# Patient Record
Sex: Male | Born: 1945 | Race: White | Hispanic: No | Smoking: Former smoker
Health system: Southern US, Community
[De-identification: ages and names within clinical notes are randomized; demographics above are authoritative.]

## PROBLEM LIST (undated history)

## (undated) DIAGNOSIS — E119 Type 2 diabetes mellitus without complications: Secondary | ICD-10-CM

## (undated) DIAGNOSIS — I10 Essential (primary) hypertension: Secondary | ICD-10-CM

## (undated) DIAGNOSIS — M109 Gout, unspecified: Secondary | ICD-10-CM

## (undated) DIAGNOSIS — K219 Gastro-esophageal reflux disease without esophagitis: Secondary | ICD-10-CM

## (undated) DIAGNOSIS — T7840XA Allergy, unspecified, initial encounter: Secondary | ICD-10-CM

## (undated) DIAGNOSIS — R350 Frequency of micturition: Secondary | ICD-10-CM

## (undated) DIAGNOSIS — K635 Polyp of colon: Secondary | ICD-10-CM

## (undated) DIAGNOSIS — G4733 Obstructive sleep apnea (adult) (pediatric): Secondary | ICD-10-CM

## (undated) DIAGNOSIS — I420 Dilated cardiomyopathy: Secondary | ICD-10-CM

## (undated) HISTORY — DX: Polyp of colon: K63.5

## (undated) HISTORY — DX: Type 2 diabetes mellitus without complications: E11.9

## (undated) HISTORY — DX: Allergy, unspecified, initial encounter: T78.40XA

## (undated) HISTORY — DX: Dilated cardiomyopathy: I42.0

## (undated) HISTORY — DX: Obstructive sleep apnea (adult) (pediatric): G47.33

## (undated) HISTORY — DX: Gout, unspecified: M10.9

## (undated) HISTORY — PX: EYE SURGERY: SHX253

## (undated) HISTORY — DX: Hemochromatosis, unspecified: E83.119

## (undated) HISTORY — DX: Gastro-esophageal reflux disease without esophagitis: K21.9

## (undated) HISTORY — DX: Essential (primary) hypertension: I10

## (undated) HISTORY — DX: Frequency of micturition: R35.0

---

## 2004-10-24 LAB — HM COLONOSCOPY

## 2006-08-17 ENCOUNTER — Ambulatory Visit: Payer: Self-pay | Admitting: Internal Medicine

## 2006-09-07 LAB — CBC WITH DIFFERENTIAL/PLATELET
Basophils Absolute: 0 10*3/uL (ref 0.0–0.1)
EOS%: 2.5 % (ref 0.0–7.0)
HCT: 41.7 % (ref 38.7–49.9)
HGB: 14.6 g/dL (ref 13.0–17.1)
LYMPH%: 27.2 % (ref 14.0–48.0)
MCH: 30.9 pg (ref 28.0–33.4)
MCV: 88.6 fL (ref 81.6–98.0)
NEUT%: 62.1 % (ref 40.0–75.0)
Platelets: 212 10*3/uL (ref 145–400)
lymph#: 1.5 10*3/uL (ref 0.9–3.3)

## 2006-09-07 LAB — FERRITIN: Ferritin: 20 ng/mL — ABNORMAL LOW (ref 22–322)

## 2006-11-28 ENCOUNTER — Ambulatory Visit: Payer: Self-pay | Admitting: Oncology

## 2006-11-30 LAB — CBC WITH DIFFERENTIAL/PLATELET
Basophils Absolute: 0 10*3/uL (ref 0.0–0.1)
Eosinophils Absolute: 0.1 10*3/uL (ref 0.0–0.5)
LYMPH%: 24.2 % (ref 14.0–48.0)
MCH: 33 pg (ref 28.0–33.4)
MCV: 89.4 fL (ref 81.6–98.0)
MONO%: 7.9 % (ref 0.0–13.0)
NEUT#: 4.7 10*3/uL (ref 1.5–6.5)
Platelets: 195 10*3/uL (ref 145–400)
RBC: 4.23 10*6/uL (ref 4.20–5.71)

## 2007-02-27 ENCOUNTER — Ambulatory Visit: Payer: Self-pay | Admitting: Oncology

## 2007-03-01 LAB — CBC WITH DIFFERENTIAL/PLATELET
BASO%: 0.6 % (ref 0.0–2.0)
EOS%: 2.3 % (ref 0.0–7.0)
HCT: 40 % (ref 38.7–49.9)
LYMPH%: 33.8 % (ref 14.0–48.0)
MCH: 33 pg (ref 28.0–33.4)
MCHC: 36.7 g/dL — ABNORMAL HIGH (ref 32.0–35.9)
MONO%: 8.9 % (ref 0.0–13.0)
NEUT%: 54.4 % (ref 40.0–75.0)
Platelets: 218 10*3/uL (ref 145–400)

## 2007-03-01 LAB — TECHNOLOGIST REVIEW

## 2007-03-01 LAB — FERRITIN: Ferritin: 105 ng/mL (ref 22–322)

## 2007-03-27 LAB — CBC WITH DIFFERENTIAL/PLATELET
Basophils Absolute: 0 10*3/uL (ref 0.0–0.1)
Eosinophils Absolute: 0.1 10*3/uL (ref 0.0–0.5)
HCT: 40.9 % (ref 38.7–49.9)
HGB: 14.9 g/dL (ref 13.0–17.1)
MCV: 91.2 fL (ref 81.6–98.0)
NEUT#: 3.8 10*3/uL (ref 1.5–6.5)
NEUT%: 63.4 % (ref 40.0–75.0)
RDW: 12.6 % (ref 11.2–14.6)
lymph#: 1.6 10*3/uL (ref 0.9–3.3)

## 2007-04-20 ENCOUNTER — Ambulatory Visit: Payer: Self-pay | Admitting: Oncology

## 2007-05-08 LAB — CBC WITH DIFFERENTIAL/PLATELET
Basophils Absolute: 0.1 10*3/uL (ref 0.0–0.1)
Eosinophils Absolute: 0.1 10*3/uL (ref 0.0–0.5)
HGB: 15.8 g/dL (ref 13.0–17.1)
LYMPH%: 24.3 % (ref 14.0–48.0)
MCH: UNDETERMINED pg (ref 28.0–33.4)
MCV: UNDETERMINED fL (ref 81.6–98.0)
MONO%: 7 % (ref 0.0–13.0)
NEUT#: 3.9 10*3/uL (ref 1.5–6.5)
NEUT%: 65.3 % (ref 40.0–75.0)
Platelets: 208 10*3/uL (ref 145–400)

## 2007-05-22 LAB — CBC WITH DIFFERENTIAL/PLATELET
BASO%: 0.5 % (ref 0.0–2.0)
EOS%: 2.5 % (ref 0.0–7.0)
Eosinophils Absolute: 0.2 10*3/uL (ref 0.0–0.5)
LYMPH%: 28.9 % (ref 14.0–48.0)
MCH: 33.2 pg (ref 28.0–33.4)
MCHC: 36.8 g/dL — ABNORMAL HIGH (ref 32.0–35.9)
MCV: 90.4 fL (ref 81.6–98.0)
MONO%: 8.3 % (ref 0.0–13.0)
Platelets: 188 10*3/uL (ref 145–400)
RBC: 4.36 10*6/uL (ref 4.20–5.71)

## 2007-05-31 LAB — CBC WITH DIFFERENTIAL/PLATELET
Eosinophils Absolute: 0.2 10*3/uL (ref 0.0–0.5)
HGB: 14.2 g/dL (ref 13.0–17.1)
MONO#: 0.6 10*3/uL (ref 0.1–0.9)
NEUT#: 4.3 10*3/uL (ref 1.5–6.5)
RBC: UNDETERMINED 10*6/uL (ref 4.20–5.71)
RDW: 10.9 % — ABNORMAL LOW (ref 11.2–14.6)
WBC: 6.9 10*3/uL (ref 4.0–10.0)

## 2007-05-31 LAB — FERRITIN: Ferritin: 29 ng/mL (ref 22–322)

## 2007-06-01 ENCOUNTER — Ambulatory Visit: Payer: Self-pay | Admitting: Oncology

## 2007-06-05 LAB — CBC WITH DIFFERENTIAL/PLATELET
BASO%: 0.4 % (ref 0.0–2.0)
Basophils Absolute: 0 10*3/uL (ref 0.0–0.1)
Eosinophils Absolute: 0.1 10*3/uL (ref 0.0–0.5)
HCT: UNDETERMINED % (ref 38.7–49.9)
HGB: 14.6 g/dL (ref 13.0–17.1)
MONO#: 0.5 10*3/uL (ref 0.1–0.9)
NEUT#: 3.2 10*3/uL (ref 1.5–6.5)
NEUT%: 59 % (ref 40.0–75.0)
WBC: 5.4 10*3/uL (ref 4.0–10.0)
lymph#: 1.5 10*3/uL (ref 0.9–3.3)

## 2007-09-03 ENCOUNTER — Ambulatory Visit: Payer: Self-pay | Admitting: Oncology

## 2007-12-03 ENCOUNTER — Ambulatory Visit: Payer: Self-pay | Admitting: Oncology

## 2007-12-06 LAB — FERRITIN: Ferritin: 62 ng/mL (ref 22–322)

## 2007-12-06 LAB — CBC WITH DIFFERENTIAL/PLATELET
BASO%: 0.2 % (ref 0.0–2.0)
Eosinophils Absolute: 0.2 10*3/uL (ref 0.0–0.5)
HCT: 42.2 % (ref 38.7–49.9)
MCHC: 34.7 g/dL (ref 32.0–35.9)
MONO#: 0.4 10*3/uL (ref 0.1–0.9)
NEUT#: 3.3 10*3/uL (ref 1.5–6.5)
NEUT%: 60 % (ref 40.0–75.0)
WBC: 5.5 10*3/uL (ref 4.0–10.0)
lymph#: 1.6 10*3/uL (ref 0.9–3.3)

## 2007-12-06 LAB — COMPREHENSIVE METABOLIC PANEL
ALT: 27 U/L (ref 0–53)
CO2: 23 mEq/L (ref 19–32)
Calcium: 9.3 mg/dL (ref 8.4–10.5)
Chloride: 107 mEq/L (ref 96–112)
Creatinine, Ser: 0.93 mg/dL (ref 0.40–1.50)
Glucose, Bld: 206 mg/dL — ABNORMAL HIGH (ref 70–99)
Sodium: 140 mEq/L (ref 135–145)
Total Protein: 6.5 g/dL (ref 6.0–8.3)

## 2008-01-30 ENCOUNTER — Ambulatory Visit: Payer: Self-pay | Admitting: Oncology

## 2008-02-04 LAB — FERRITIN: Ferritin: 78 ng/mL (ref 22–322)

## 2008-04-01 ENCOUNTER — Ambulatory Visit: Payer: Self-pay | Admitting: Oncology

## 2008-04-03 LAB — COMPREHENSIVE METABOLIC PANEL
Alkaline Phosphatase: 38 U/L — ABNORMAL LOW (ref 39–117)
BUN: 28 mg/dL — ABNORMAL HIGH (ref 6–23)
CO2: 25 mEq/L (ref 19–32)
Creatinine, Ser: 0.84 mg/dL (ref 0.40–1.50)
Glucose, Bld: 352 mg/dL — ABNORMAL HIGH (ref 70–99)
Sodium: 138 mEq/L (ref 135–145)
Total Bilirubin: 0.7 mg/dL (ref 0.3–1.2)
Total Protein: 6.3 g/dL (ref 6.0–8.3)

## 2008-07-31 ENCOUNTER — Ambulatory Visit: Payer: Self-pay | Admitting: Oncology

## 2008-08-04 LAB — CBC WITH DIFFERENTIAL/PLATELET
Basophils Absolute: 0 10*3/uL (ref 0.0–0.1)
Eosinophils Absolute: 0.1 10*3/uL (ref 0.0–0.5)
HCT: 41.7 % (ref 38.7–49.9)
HGB: 14.8 g/dL (ref 13.0–17.1)
MCH: 32.8 pg (ref 28.0–33.4)
MONO#: 0.5 10*3/uL (ref 0.1–0.9)
NEUT#: 3.5 10*3/uL (ref 1.5–6.5)
NEUT%: 62 % (ref 40.0–75.0)
RDW: 12.9 % (ref 11.2–14.6)
WBC: 5.6 10*3/uL (ref 4.0–10.0)
lymph#: 1.5 10*3/uL (ref 0.9–3.3)

## 2008-09-15 ENCOUNTER — Ambulatory Visit: Payer: Self-pay | Admitting: Oncology

## 2008-09-17 LAB — CBC WITH DIFFERENTIAL/PLATELET
BASO%: 0.2 % (ref 0.0–2.0)
HCT: 40.1 % (ref 38.7–49.9)
MCHC: 35.6 g/dL (ref 32.0–35.9)
MONO#: 0.5 10*3/uL (ref 0.1–0.9)
NEUT%: 62.3 % (ref 40.0–75.0)
RDW: 13.2 % (ref 11.2–14.6)
WBC: 5.8 10*3/uL (ref 4.0–10.0)
lymph#: 1.5 10*3/uL (ref 0.9–3.3)

## 2008-10-10 LAB — FERRITIN: Ferritin: 24 ng/mL (ref 22–322)

## 2009-01-29 ENCOUNTER — Ambulatory Visit: Payer: Self-pay | Admitting: Oncology

## 2009-02-02 LAB — FERRITIN: Ferritin: 88 ng/mL (ref 22–322)

## 2009-04-30 ENCOUNTER — Ambulatory Visit: Payer: Self-pay | Admitting: Oncology

## 2009-05-04 LAB — CBC WITH DIFFERENTIAL/PLATELET
Basophils Absolute: 0 10*3/uL (ref 0.0–0.1)
Eosinophils Absolute: 0.2 10*3/uL (ref 0.0–0.5)
HCT: 39.1 % (ref 38.4–49.9)
LYMPH%: 37.1 % (ref 14.0–49.0)
MCV: 91.3 fL (ref 79.3–98.0)
MONO%: 8.5 % (ref 0.0–14.0)
NEUT#: 2.9 10*3/uL (ref 1.5–6.5)
NEUT%: 51 % (ref 39.0–75.0)
Platelets: 164 10*3/uL (ref 140–400)
RBC: 4.28 10*6/uL (ref 4.20–5.82)

## 2009-05-26 ENCOUNTER — Ambulatory Visit: Payer: Self-pay | Admitting: Oncology

## 2009-06-11 LAB — CBC WITH DIFFERENTIAL/PLATELET
Basophils Absolute: 0 10*3/uL (ref 0.0–0.1)
Eosinophils Absolute: 0.2 10*3/uL (ref 0.0–0.5)
HGB: 14.5 g/dL (ref 13.0–17.1)
MCV: 93.5 fL (ref 79.3–98.0)
MONO#: 0.5 10*3/uL (ref 0.1–0.9)
MONO%: 8.7 % (ref 0.0–14.0)
NEUT#: 3.5 10*3/uL (ref 1.5–6.5)
Platelets: 194 10*3/uL (ref 140–400)
RDW: 13.6 % (ref 11.0–14.6)

## 2009-07-31 ENCOUNTER — Ambulatory Visit: Payer: Self-pay | Admitting: Oncology

## 2009-08-03 LAB — CBC WITH DIFFERENTIAL/PLATELET
Eosinophils Absolute: 0.2 10*3/uL (ref 0.0–0.5)
LYMPH%: 30.5 % (ref 14.0–49.0)
MCH: 32.3 pg (ref 27.2–33.4)
MCHC: 35.6 g/dL (ref 32.0–36.0)
MCV: 90.9 fL (ref 79.3–98.0)
Platelets: 214 10*3/uL (ref 140–400)
RBC: 4.56 10*6/uL (ref 4.20–5.82)
RDW: 12.2 % (ref 11.0–14.6)

## 2010-01-08 ENCOUNTER — Encounter (INDEPENDENT_AMBULATORY_CARE_PROVIDER_SITE_OTHER): Payer: Self-pay | Admitting: Internal Medicine

## 2010-01-08 ENCOUNTER — Ambulatory Visit: Payer: Self-pay

## 2010-01-08 ENCOUNTER — Ambulatory Visit (HOSPITAL_COMMUNITY): Admission: RE | Admit: 2010-01-08 | Discharge: 2010-01-08 | Payer: Self-pay | Admitting: Internal Medicine

## 2010-01-08 ENCOUNTER — Ambulatory Visit: Payer: Self-pay | Admitting: Cardiology

## 2010-02-02 ENCOUNTER — Ambulatory Visit: Payer: Self-pay | Admitting: Oncology

## 2010-02-02 LAB — COMPREHENSIVE METABOLIC PANEL
ALT: 20 U/L (ref 0–53)
Albumin: 4.6 g/dL (ref 3.5–5.2)
Alkaline Phosphatase: 37 U/L — ABNORMAL LOW (ref 39–117)
CO2: 23 mEq/L (ref 19–32)
Glucose, Bld: 130 mg/dL — ABNORMAL HIGH (ref 70–99)
Potassium: 3.8 mEq/L (ref 3.5–5.3)
Sodium: 140 mEq/L (ref 135–145)
Total Protein: 6.3 g/dL (ref 6.0–8.3)

## 2010-02-02 LAB — FERRITIN: Ferritin: 65 ng/mL (ref 22–322)

## 2010-07-30 ENCOUNTER — Ambulatory Visit: Payer: Self-pay | Admitting: Oncology

## 2010-08-03 LAB — FERRITIN: Ferritin: 117 ng/mL (ref 22–322)

## 2010-08-24 LAB — CBC WITH DIFFERENTIAL/PLATELET
BASO%: 0.3 % (ref 0.0–2.0)
EOS%: 2.3 % (ref 0.0–7.0)
Eosinophils Absolute: 0.1 10*3/uL (ref 0.0–0.5)
LYMPH%: 28.7 % (ref 14.0–49.0)
MCHC: UNDETERMINED g/dL (ref 32.0–36.0)
MCV: 87.6 fL (ref 79.3–98.0)
MONO%: 9.2 % (ref 0.0–14.0)
NEUT#: 3.7 10*3/uL (ref 1.5–6.5)
RBC: 4.44 10*6/uL (ref 4.20–5.82)
RDW: 13.3 % (ref 11.0–14.6)
nRBC: 0 % (ref 0–0)

## 2010-09-03 ENCOUNTER — Ambulatory Visit: Payer: Self-pay | Admitting: Oncology

## 2010-09-21 LAB — CBC WITH DIFFERENTIAL/PLATELET
BASO%: 0.8 % (ref 0.0–2.0)
EOS%: 2.7 % (ref 0.0–7.0)
HCT: 40.6 % (ref 38.4–49.9)
MCH: 33.2 pg (ref 27.2–33.4)
MCHC: 35.7 g/dL (ref 32.0–36.0)
MONO#: 0.5 10*3/uL (ref 0.1–0.9)
RBC: 4.37 10*6/uL (ref 4.20–5.82)
RDW: 13.1 % (ref 11.0–14.6)
WBC: 6 10*3/uL (ref 4.0–10.3)
lymph#: 1.8 10*3/uL (ref 0.9–3.3)

## 2010-12-02 ENCOUNTER — Other Ambulatory Visit: Payer: Self-pay | Admitting: Oncology

## 2010-12-02 ENCOUNTER — Encounter: Payer: 59 | Admitting: Oncology

## 2011-03-30 ENCOUNTER — Ambulatory Visit (HOSPITAL_COMMUNITY)
Admission: RE | Admit: 2011-03-30 | Discharge: 2011-03-30 | Disposition: A | Discharge status: Home / Self Care | Payer: 59 | Source: Ambulatory Visit | Attending: Oncology | Admitting: Oncology

## 2011-03-30 ENCOUNTER — Other Ambulatory Visit: Payer: Self-pay | Admitting: Oncology

## 2011-03-30 ENCOUNTER — Encounter (HOSPITAL_BASED_OUTPATIENT_CLINIC_OR_DEPARTMENT_OTHER): Payer: 59 | Admitting: Oncology

## 2011-03-30 DIAGNOSIS — M79609 Pain in unspecified limb: Secondary | ICD-10-CM | POA: Insufficient documentation

## 2011-03-30 DIAGNOSIS — I10 Essential (primary) hypertension: Secondary | ICD-10-CM

## 2011-03-30 DIAGNOSIS — E119 Type 2 diabetes mellitus without complications: Secondary | ICD-10-CM

## 2011-03-30 DIAGNOSIS — Z87891 Personal history of nicotine dependence: Secondary | ICD-10-CM

## 2011-03-30 DIAGNOSIS — M7989 Other specified soft tissue disorders: Secondary | ICD-10-CM | POA: Insufficient documentation

## 2011-03-30 LAB — COMPREHENSIVE METABOLIC PANEL
ALT: 16 U/L (ref 0–53)
AST: 11 U/L (ref 0–37)
CO2: 28 mEq/L (ref 19–32)
Calcium: 9.2 mg/dL (ref 8.4–10.5)
Chloride: 101 mEq/L (ref 96–112)
Creatinine, Ser: 0.93 mg/dL (ref 0.50–1.35)
Potassium: 4.3 mEq/L (ref 3.5–5.3)
Sodium: 135 mEq/L (ref 135–145)
Total Protein: 6.4 g/dL (ref 6.0–8.3)

## 2011-06-30 ENCOUNTER — Encounter: Payer: Medicare Other | Admitting: Oncology

## 2011-06-30 ENCOUNTER — Other Ambulatory Visit: Payer: Self-pay | Admitting: Oncology

## 2011-06-30 LAB — FERRITIN: Ferritin: 111 ng/mL (ref 22–322)

## 2011-09-26 ENCOUNTER — Other Ambulatory Visit: Payer: Self-pay | Admitting: *Deleted

## 2011-09-27 ENCOUNTER — Ambulatory Visit (HOSPITAL_BASED_OUTPATIENT_CLINIC_OR_DEPARTMENT_OTHER): Payer: Medicare Other | Admitting: Oncology

## 2011-09-27 ENCOUNTER — Other Ambulatory Visit (HOSPITAL_BASED_OUTPATIENT_CLINIC_OR_DEPARTMENT_OTHER): Payer: Medicare Other | Admitting: Lab

## 2011-09-27 DIAGNOSIS — E119 Type 2 diabetes mellitus without complications: Secondary | ICD-10-CM

## 2011-09-27 DIAGNOSIS — E7800 Pure hypercholesterolemia, unspecified: Secondary | ICD-10-CM

## 2011-09-27 DIAGNOSIS — I10 Essential (primary) hypertension: Secondary | ICD-10-CM

## 2011-09-27 NOTE — Progress Notes (Signed)
OFFICE PROGRESS NOTE   INTERVAL HISTORY:   He returns as scheduled. He feels well. His only complaint is a "dry" cough during the afternoon and evening. There are no associated symptoms. He usually has a cough this time of the year. He last underwent phlebotomy treatment in November of 2011.  Objective:  Vital signs in last 24 hours:  Blood pressure 129/74, pulse 70, temperature 97.6 F (36.4 C), temperature source Oral, weight 195 lb 1.6 oz (88.497 kg).   Resp: Lungs clear bilaterally Cardio: Distant heart sounds. Regular rate and rhythm. GI: No hepatosplenomegaly. Vascular: No leg edema    Lab Results:  Ferritin-111 on 06/30/2011, 128 on 09/27/2011  Medications: I have reviewed the patient's current medications.  Assessment/Plan: 1. Hereditary hemochromatosis.  He was last treated with phlebotomy therapy in November 2011.  The ferritin is elevated above the goal range today. 2. History of tobacco use in the remote past. 3. Hypertension. 4. Hypercholesterolemia. 5. Diabetes. 6. Gout. 7. Sleep apnea. 8. Family history of hemochromatosis.   Disposition:  He appears well. The ferritin level is elevated above the goal range. We will schedule a series of phlebotomy treatments to begin within the next one to 2 weeks. He will return for a ferritin level in 4 months. He is scheduled for an eight month office visit.   Lucile Shutters, MD  09/27/2011  5:53 PM

## 2011-09-28 ENCOUNTER — Telehealth: Payer: Self-pay | Admitting: Oncology

## 2011-09-28 ENCOUNTER — Encounter: Payer: Self-pay | Admitting: *Deleted

## 2011-09-28 ENCOUNTER — Other Ambulatory Visit: Payer: Self-pay | Admitting: *Deleted

## 2011-09-28 ENCOUNTER — Telehealth: Payer: Self-pay | Admitting: *Deleted

## 2011-09-28 NOTE — Telephone Encounter (Signed)
Made patient aware that ferritin is higher at 128. Needs phlebotomy every 2 weeks X 4 per Dr. Truett Perna. Will check CBC with 1st and 3rd phlebotomy. Patient understands and agrees--he is requesting only morning appointments.

## 2011-09-28 NOTE — Telephone Encounter (Signed)
S/w the pt regarding his phlebotomy appt on 10/05/2011 and to pick up the rest of his appts at that time

## 2011-10-04 ENCOUNTER — Ambulatory Visit (INDEPENDENT_AMBULATORY_CARE_PROVIDER_SITE_OTHER): Payer: Medicare Other | Admitting: Ophthalmology

## 2011-10-04 DIAGNOSIS — E1139 Type 2 diabetes mellitus with other diabetic ophthalmic complication: Secondary | ICD-10-CM

## 2011-10-04 DIAGNOSIS — E11319 Type 2 diabetes mellitus with unspecified diabetic retinopathy without macular edema: Secondary | ICD-10-CM

## 2011-10-04 DIAGNOSIS — H35039 Hypertensive retinopathy, unspecified eye: Secondary | ICD-10-CM

## 2011-10-04 DIAGNOSIS — I10 Essential (primary) hypertension: Secondary | ICD-10-CM

## 2011-10-06 ENCOUNTER — Other Ambulatory Visit: Payer: Self-pay | Admitting: *Deleted

## 2011-10-06 ENCOUNTER — Ambulatory Visit (HOSPITAL_BASED_OUTPATIENT_CLINIC_OR_DEPARTMENT_OTHER): Payer: Medicare Other

## 2011-10-06 ENCOUNTER — Other Ambulatory Visit: Payer: Self-pay | Admitting: Oncology

## 2011-10-06 ENCOUNTER — Other Ambulatory Visit (HOSPITAL_BASED_OUTPATIENT_CLINIC_OR_DEPARTMENT_OTHER): Payer: Medicare Other | Admitting: Lab

## 2011-10-06 ENCOUNTER — Telehealth: Payer: Self-pay | Admitting: Oncology

## 2011-10-06 LAB — CBC WITH DIFFERENTIAL/PLATELET
BASO%: 0.5 % (ref 0.0–2.0)
Eosinophils Absolute: 0.1 10*3/uL (ref 0.0–0.5)
HCT: 40.7 % (ref 38.4–49.9)
LYMPH%: 29.5 % (ref 14.0–49.0)
MCHC: 35.5 g/dL (ref 32.0–36.0)
MCV: 92.9 fL (ref 79.3–98.0)
MONO#: 0.5 10*3/uL (ref 0.1–0.9)
MONO%: 8.9 % (ref 0.0–14.0)
NEUT%: 58.6 % (ref 39.0–75.0)
Platelets: 175 10*3/uL (ref 140–400)
WBC: 5.7 10*3/uL (ref 4.0–10.3)

## 2011-10-06 NOTE — Telephone Encounter (Signed)
gve the pt his dec,Timtohy,march,aug 2013 appt calendar

## 2011-10-06 NOTE — Patient Instructions (Signed)
1191 Pt verbalized knowledge of next appt and would call if any problems.

## 2011-10-06 NOTE — Progress Notes (Signed)
1610 Pt tolerated phlebotomy well with 550cc of blood removed.  Pt verbalized no lightheadness/dizziness noted and drank fluids provided while here.

## 2011-10-31 ENCOUNTER — Other Ambulatory Visit: Payer: Self-pay | Admitting: Oncology

## 2011-11-01 ENCOUNTER — Other Ambulatory Visit: Payer: Self-pay | Admitting: Oncology

## 2011-11-01 ENCOUNTER — Ambulatory Visit (HOSPITAL_BASED_OUTPATIENT_CLINIC_OR_DEPARTMENT_OTHER): Payer: Medicare Other

## 2011-11-01 ENCOUNTER — Other Ambulatory Visit: Payer: Medicare Other | Admitting: Lab

## 2011-11-01 ENCOUNTER — Other Ambulatory Visit: Payer: Medicare Other

## 2011-11-01 LAB — CBC WITH DIFFERENTIAL/PLATELET
Basophils Absolute: 0 10*3/uL (ref 0.0–0.1)
EOS%: 2.4 % (ref 0.0–7.0)
HCT: 43.1 % (ref 38.4–49.9)
HGB: 15.2 g/dL (ref 13.0–17.1)
LYMPH%: 30 % (ref 14.0–49.0)
MCH: 33.1 pg (ref 27.2–33.4)
MCV: 94 fL (ref 79.3–98.0)
MONO%: 9.2 % (ref 0.0–14.0)
NEUT%: 58.2 % (ref 39.0–75.0)

## 2011-11-01 NOTE — Progress Notes (Signed)
550 grams removed during phlebotomy treatment; tolerated well. Patient given soda to drink and encouraged to drink plenty of fluids today.

## 2011-11-04 DIAGNOSIS — E559 Vitamin D deficiency, unspecified: Secondary | ICD-10-CM | POA: Diagnosis not present

## 2011-11-04 DIAGNOSIS — E782 Mixed hyperlipidemia: Secondary | ICD-10-CM | POA: Diagnosis not present

## 2011-11-04 DIAGNOSIS — M109 Gout, unspecified: Secondary | ICD-10-CM | POA: Diagnosis not present

## 2011-11-04 DIAGNOSIS — E119 Type 2 diabetes mellitus without complications: Secondary | ICD-10-CM | POA: Diagnosis not present

## 2011-11-04 DIAGNOSIS — I10 Essential (primary) hypertension: Secondary | ICD-10-CM | POA: Diagnosis not present

## 2011-11-14 ENCOUNTER — Other Ambulatory Visit: Payer: Self-pay | Admitting: *Deleted

## 2011-11-14 ENCOUNTER — Other Ambulatory Visit: Payer: Self-pay | Admitting: Oncology

## 2011-11-15 ENCOUNTER — Ambulatory Visit: Payer: Medicare Other

## 2011-11-15 ENCOUNTER — Other Ambulatory Visit: Payer: Self-pay | Admitting: *Deleted

## 2011-11-15 ENCOUNTER — Other Ambulatory Visit: Payer: Medicare Other | Admitting: Lab

## 2011-11-15 LAB — CBC WITH DIFFERENTIAL/PLATELET
Basophils Absolute: 0 10*3/uL (ref 0.0–0.1)
EOS%: 2.2 % (ref 0.0–7.0)
MCH: 33.5 pg — ABNORMAL HIGH (ref 27.2–33.4)
MCHC: 35.6 g/dL (ref 32.0–36.0)
MCV: 94 fL (ref 79.3–98.0)
MONO%: 7.2 % (ref 0.0–14.0)
RBC: 4.49 10*6/uL (ref 4.20–5.82)
RDW: 13.4 % (ref 11.0–14.6)

## 2011-11-15 NOTE — Progress Notes (Signed)
Patient tolerated phlebotomy well. Refreshments given, after 30 minutes vss. D/C to home, instructed to increase hydration, rest, eat a good lunch, if dizzy sit down and put head on his knees, if persists, call md.

## 2011-11-15 NOTE — Progress Notes (Signed)
Patient needs one more phlebotomy to complete the #4 cycle ordered. Will not require lab with last phlebotomy per Dr. Truett Perna.

## 2011-11-16 ENCOUNTER — Telehealth: Payer: Self-pay | Admitting: Oncology

## 2011-11-16 NOTE — Telephone Encounter (Signed)
called pt and scheduled appt for 11/29/2011

## 2011-11-23 ENCOUNTER — Other Ambulatory Visit: Payer: Self-pay | Admitting: Certified Registered Nurse Anesthetist

## 2011-11-23 ENCOUNTER — Other Ambulatory Visit: Payer: Self-pay | Admitting: *Deleted

## 2011-11-29 ENCOUNTER — Other Ambulatory Visit: Payer: Self-pay | Admitting: Nurse Practitioner

## 2011-11-29 ENCOUNTER — Ambulatory Visit (HOSPITAL_BASED_OUTPATIENT_CLINIC_OR_DEPARTMENT_OTHER): Payer: Medicare Other

## 2011-11-29 NOTE — Progress Notes (Signed)
Removed 500cc blood from patient in 10 minutes; patient tolerated well; pre and post vs stable.

## 2011-11-29 NOTE — Patient Instructions (Signed)
Patient discharged home with no complaints; 30 minute post phlebotomy observation done; post vs stable; patient discharged home with no c/o pain or distress.

## 2012-01-26 ENCOUNTER — Other Ambulatory Visit (HOSPITAL_BASED_OUTPATIENT_CLINIC_OR_DEPARTMENT_OTHER): Payer: Medicare Other | Admitting: Lab

## 2012-01-26 LAB — FERRITIN: Ferritin: 32 ng/mL (ref 22–322)

## 2012-01-31 ENCOUNTER — Telehealth: Payer: Self-pay | Admitting: *Deleted

## 2012-01-31 DIAGNOSIS — E119 Type 2 diabetes mellitus without complications: Secondary | ICD-10-CM | POA: Diagnosis not present

## 2012-01-31 DIAGNOSIS — E559 Vitamin D deficiency, unspecified: Secondary | ICD-10-CM | POA: Diagnosis not present

## 2012-01-31 DIAGNOSIS — I10 Essential (primary) hypertension: Secondary | ICD-10-CM | POA: Diagnosis not present

## 2012-01-31 DIAGNOSIS — E782 Mixed hyperlipidemia: Secondary | ICD-10-CM | POA: Diagnosis not present

## 2012-01-31 DIAGNOSIS — Z79899 Other long term (current) drug therapy: Secondary | ICD-10-CM | POA: Diagnosis not present

## 2012-01-31 NOTE — Telephone Encounter (Signed)
Patient notified of ferritin in goal range. Reviewed appointment date/time.

## 2012-01-31 NOTE — Telephone Encounter (Signed)
Message copied by Wandalee Ferdinand on Tue Jan 31, 2012  6:09 PM ------      Message from: Ladene Artist      Created: Fri Jan 27, 2012  6:57 PM       Please call patient, ferritin in goal range, f/u as scheduled

## 2012-04-20 ENCOUNTER — Telehealth: Payer: Self-pay | Admitting: Oncology

## 2012-04-20 NOTE — Telephone Encounter (Signed)
called pt and r/s appt on 08/08 to 08/05 due to md on PAL

## 2012-05-01 DIAGNOSIS — M109 Gout, unspecified: Secondary | ICD-10-CM | POA: Diagnosis not present

## 2012-05-01 DIAGNOSIS — I10 Essential (primary) hypertension: Secondary | ICD-10-CM | POA: Diagnosis not present

## 2012-05-01 DIAGNOSIS — E782 Mixed hyperlipidemia: Secondary | ICD-10-CM | POA: Diagnosis not present

## 2012-05-01 DIAGNOSIS — E119 Type 2 diabetes mellitus without complications: Secondary | ICD-10-CM | POA: Diagnosis not present

## 2012-05-01 DIAGNOSIS — Z79899 Other long term (current) drug therapy: Secondary | ICD-10-CM | POA: Diagnosis not present

## 2012-05-28 ENCOUNTER — Ambulatory Visit (HOSPITAL_BASED_OUTPATIENT_CLINIC_OR_DEPARTMENT_OTHER): Payer: Medicare Other | Admitting: Oncology

## 2012-05-28 ENCOUNTER — Telehealth: Payer: Self-pay | Admitting: Oncology

## 2012-05-28 ENCOUNTER — Other Ambulatory Visit: Payer: Medicare Other | Admitting: Lab

## 2012-05-28 DIAGNOSIS — I10 Essential (primary) hypertension: Secondary | ICD-10-CM

## 2012-05-28 DIAGNOSIS — E119 Type 2 diabetes mellitus without complications: Secondary | ICD-10-CM

## 2012-05-28 LAB — FERRITIN: Ferritin: 64 ng/mL (ref 22–322)

## 2012-05-28 NOTE — Telephone Encounter (Signed)
appts made and printed for pt aom °

## 2012-05-28 NOTE — Progress Notes (Signed)
   [facility]    OFFICE PROGRESS NOTE   INTERVAL HISTORY:   He returns as scheduled. No complaint. He last underwent phlebotomy in February of this year.  Objective:  Vital signs in last 24 hours:  Blood pressure 162/85, pulse 76, temperature 98.2 F (36.8 C), temperature source Oral, resp. rate 20, height 5\' 5"  (1.651 m), weight 198 lb 6.4 oz (89.994 kg).   Resp: Lungs clear bilaterally Cardio: Regular rate and rhythm GI: No hepatosplenomegaly Vascular: No leg edema   Lab Results:  Lab Results  Component Value Date   WBC 6.1 11/15/2011   HGB 15.0 11/15/2011   HCT 42.2 11/15/2011   MCV 94.0 11/15/2011   PLT 178 11/15/2011   Ferritin-32 on 01/26/2012, 64 on 05/28/2012   Medications: I have reviewed the patient's current medications.  Assessment/Plan: 1. 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 He was last treated with phlebotomy therapy in February of 2013. The ferritin level is slightly elevated today  2. History of tobacco use in the remote past. 3. Hypertension. 4. Hypercholesterolemia. 5. Diabetes. 6. Gout. 7. Sleep apnea. 8. Family history of hemochromatosis.  Disposition:  He appears stable. He will return for a repeat ferritin in 3 months. The plan is to initiate phlebotomy therapy for a ferritin level of greater than 100. Mr. Nienhuis will return for an office visit in 6 months.   Thornton Papas, MD  05/28/2012  6:52 PM

## 2012-05-29 ENCOUNTER — Other Ambulatory Visit: Payer: Medicare Other | Admitting: Lab

## 2012-05-31 ENCOUNTER — Telehealth: Payer: Self-pay | Admitting: *Deleted

## 2012-05-31 ENCOUNTER — Ambulatory Visit: Payer: Medicare Other | Admitting: Oncology

## 2012-05-31 ENCOUNTER — Other Ambulatory Visit: Payer: Medicare Other | Admitting: Lab

## 2012-05-31 NOTE — Telephone Encounter (Signed)
Spoke with pt informing him Ferritin is OK and will repeat lab in 3 months.  Pt verbalized understanding and have Nov. appt already.

## 2012-05-31 NOTE — Telephone Encounter (Signed)
Message copied by Caleb Popp on Thu May 31, 2012 11:43 AM ------      Message from: Thornton Papas B      Created: Mon May 28, 2012  9:39 PM       Please call patient, ferritin is ok, repeat in 3 months

## 2012-08-21 DIAGNOSIS — Z0000 Encounter for general adult medical examination without abnormal findings: Secondary | ICD-10-CM | POA: Diagnosis not present

## 2012-08-21 DIAGNOSIS — Z1212 Encounter for screening for malignant neoplasm of rectum: Secondary | ICD-10-CM | POA: Diagnosis not present

## 2012-08-21 DIAGNOSIS — D649 Anemia, unspecified: Secondary | ICD-10-CM | POA: Diagnosis not present

## 2012-08-21 DIAGNOSIS — R5383 Other fatigue: Secondary | ICD-10-CM | POA: Diagnosis not present

## 2012-08-21 DIAGNOSIS — Z23 Encounter for immunization: Secondary | ICD-10-CM | POA: Diagnosis not present

## 2012-08-21 DIAGNOSIS — Z125 Encounter for screening for malignant neoplasm of prostate: Secondary | ICD-10-CM | POA: Diagnosis not present

## 2012-08-21 DIAGNOSIS — E559 Vitamin D deficiency, unspecified: Secondary | ICD-10-CM | POA: Diagnosis not present

## 2012-08-21 DIAGNOSIS — E782 Mixed hyperlipidemia: Secondary | ICD-10-CM | POA: Diagnosis not present

## 2012-08-21 DIAGNOSIS — M109 Gout, unspecified: Secondary | ICD-10-CM | POA: Diagnosis not present

## 2012-08-21 DIAGNOSIS — E119 Type 2 diabetes mellitus without complications: Secondary | ICD-10-CM | POA: Diagnosis not present

## 2012-08-21 DIAGNOSIS — I10 Essential (primary) hypertension: Secondary | ICD-10-CM | POA: Diagnosis not present

## 2012-08-24 ENCOUNTER — Other Ambulatory Visit (HOSPITAL_COMMUNITY): Payer: Self-pay | Admitting: Internal Medicine

## 2012-08-24 ENCOUNTER — Ambulatory Visit (HOSPITAL_COMMUNITY)
Admission: RE | Admit: 2012-08-24 | Discharge: 2012-08-24 | Disposition: A | Discharge status: Home / Self Care | Payer: Medicare Other | Source: Ambulatory Visit | Attending: Internal Medicine | Admitting: Internal Medicine

## 2012-08-24 DIAGNOSIS — R059 Cough, unspecified: Secondary | ICD-10-CM | POA: Insufficient documentation

## 2012-08-24 DIAGNOSIS — R05 Cough: Secondary | ICD-10-CM

## 2012-08-24 IMAGING — CR DG CHEST 2V
2 series · 2 of 2 positions shown · non-contrast
Comparison: None.

CLINICAL DATA: 66-year-old male with cough.

CHEST - 2 VIEW

[view not recorded (1 of 2)]
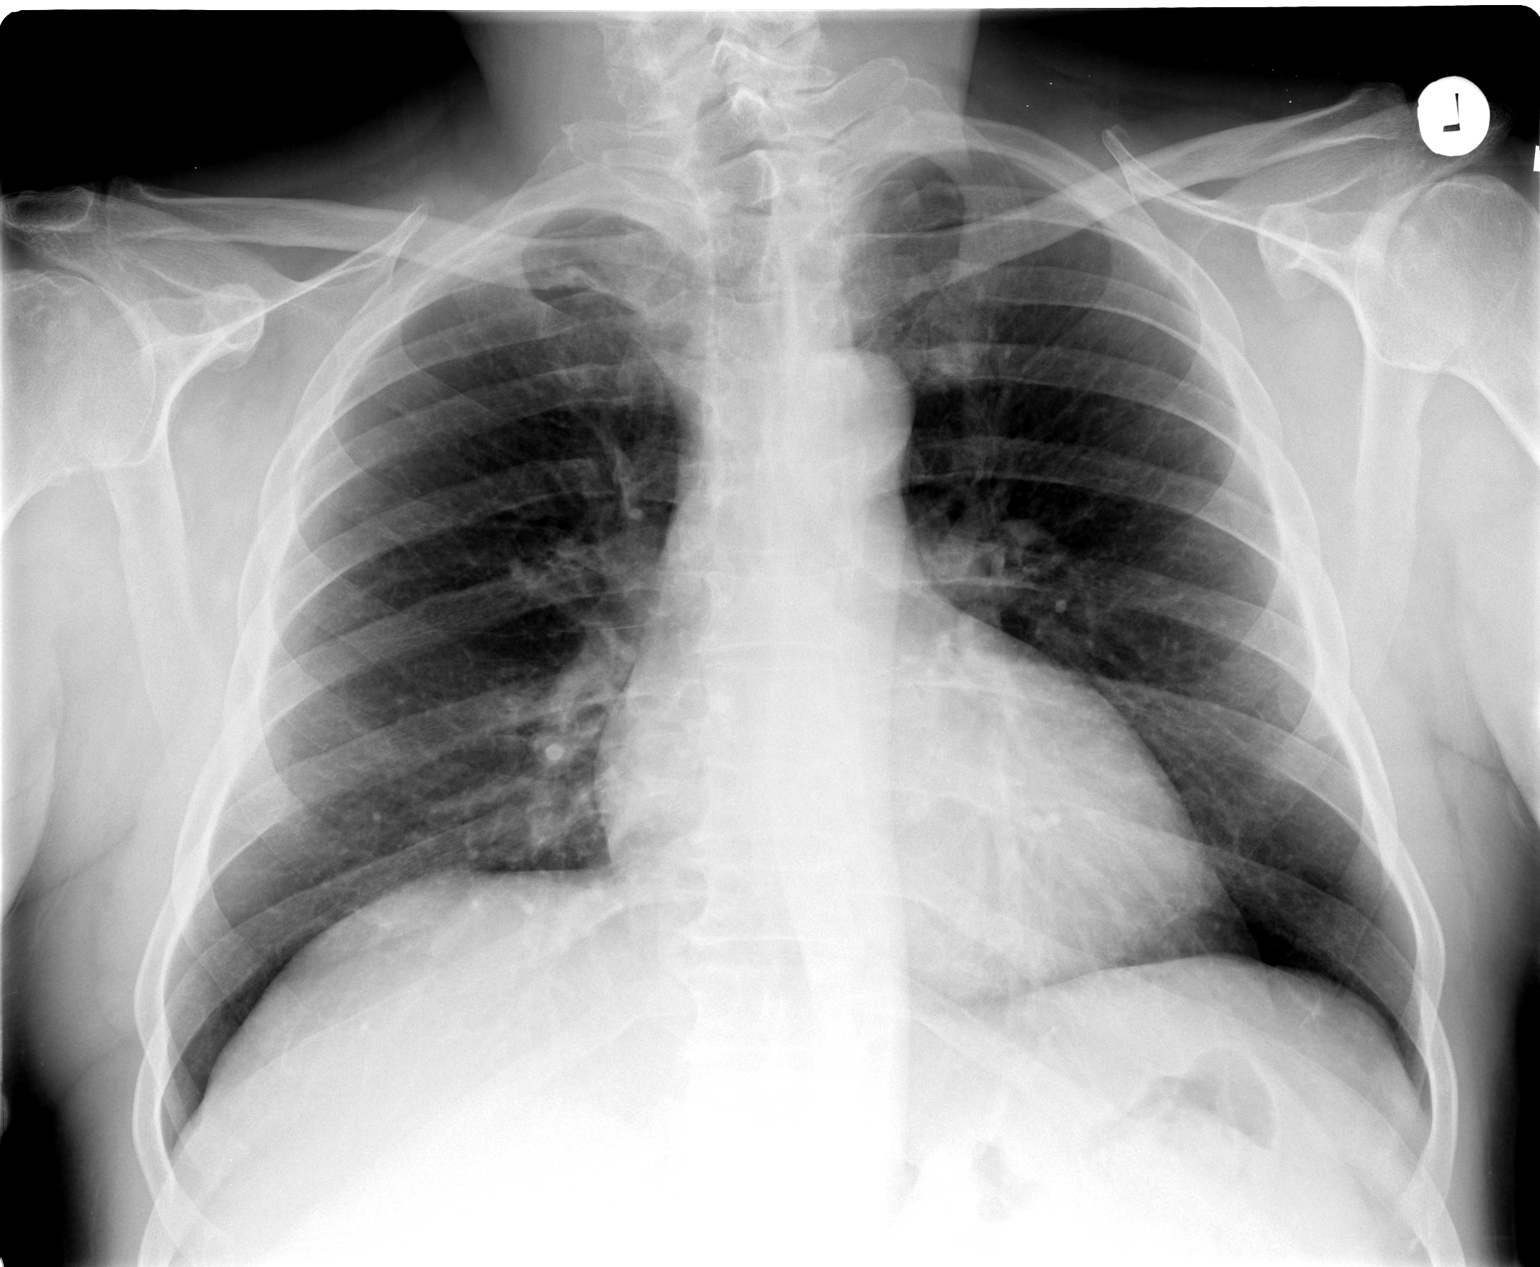

[view not recorded (2 of 2)]
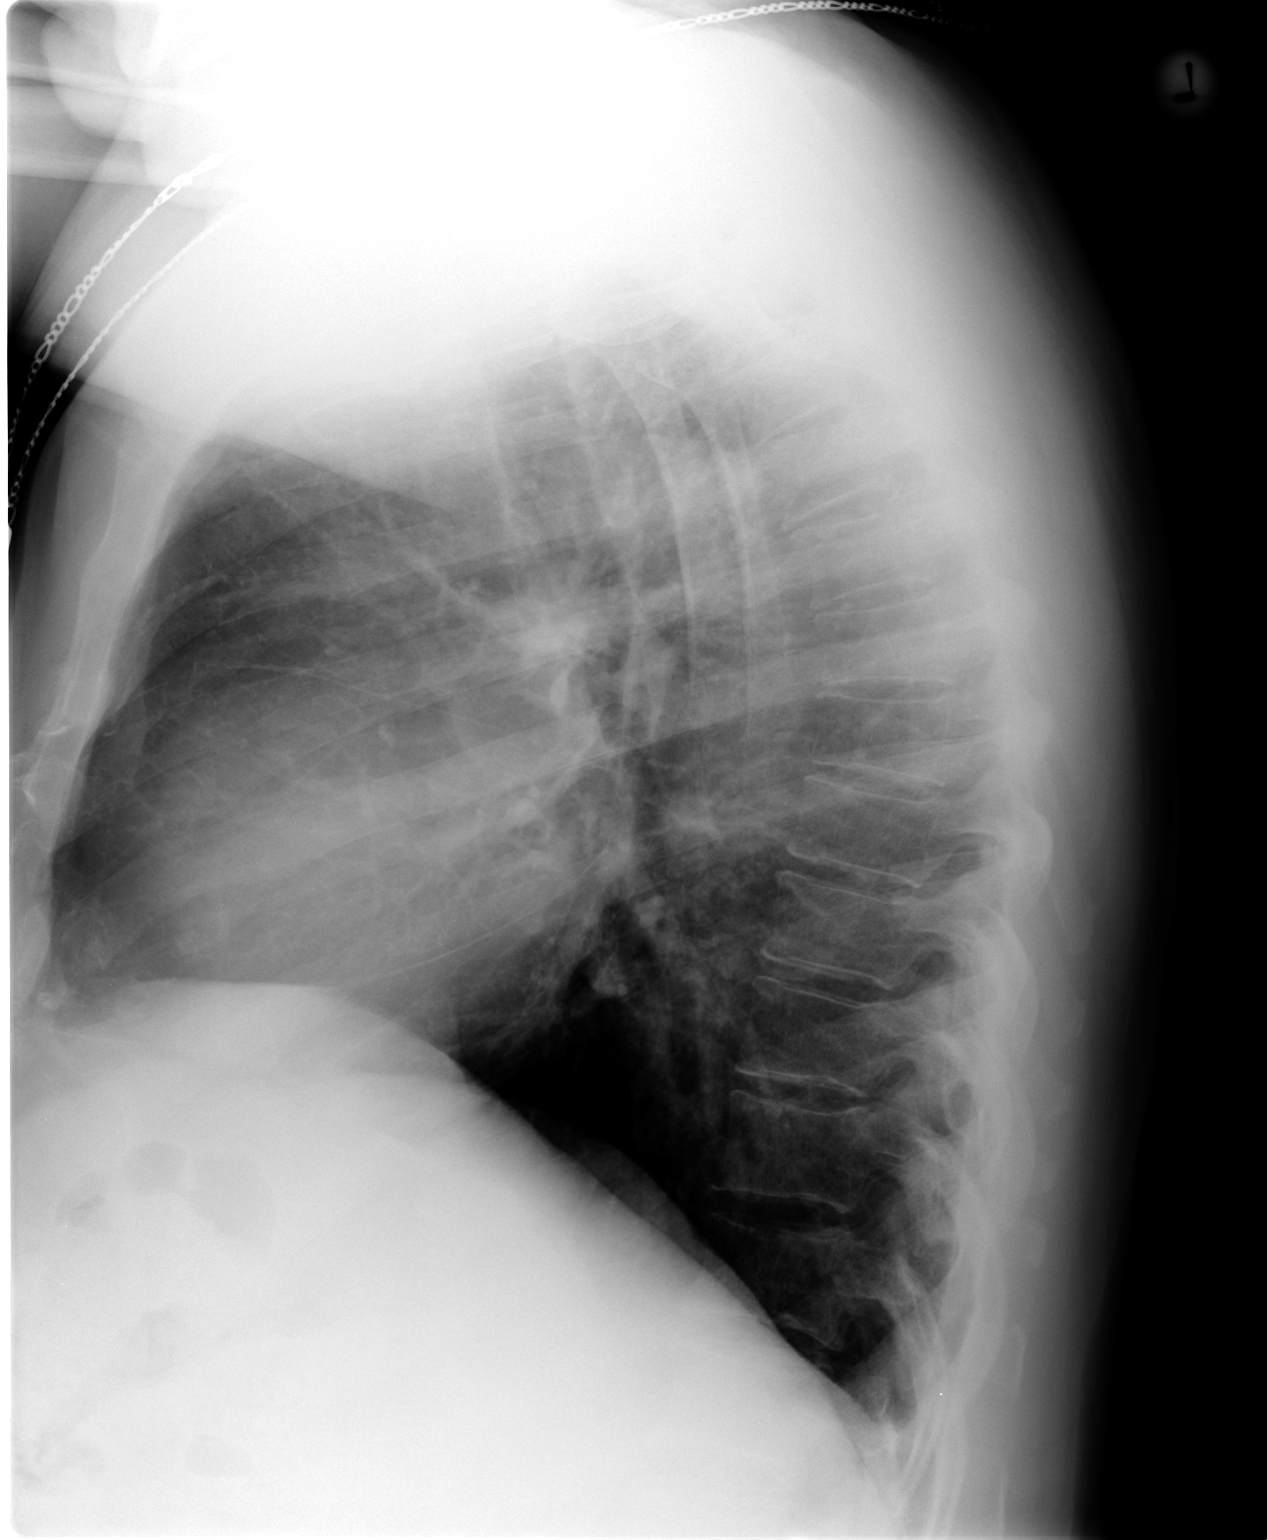

[2 of 2 positions shown; findings below may reference images not displayed]

FINDINGS: Normal lung volumes.  Cardiac size and mediastinal
contours are within normal limits.  Visualized tracheal air column
is within normal limits.  No pneumothorax, pulmonary edema, pleural
effusion or confluent pulmonary opacity. No acute osseous
abnormality identified.
IMPRESSION: No acute cardiopulmonary abnormality.

## 2012-08-27 ENCOUNTER — Other Ambulatory Visit (HOSPITAL_BASED_OUTPATIENT_CLINIC_OR_DEPARTMENT_OTHER): Payer: Medicare Other | Admitting: Lab

## 2012-08-27 LAB — FERRITIN: Ferritin: 98 ng/mL (ref 22–322)

## 2012-08-29 ENCOUNTER — Telehealth: Payer: Self-pay | Admitting: *Deleted

## 2012-08-29 NOTE — Telephone Encounter (Signed)
Notified patient of ferritin at 98, but still in goal. Will recheck in 3 months. He understands and agrees.

## 2012-08-29 NOTE — Telephone Encounter (Signed)
Message copied by Wandalee Ferdinand on Wed Aug 29, 2012  3:44 PM ------      Message from: Thornton Papas B      Created: Mon Aug 27, 2012  8:55 PM       Please call patient, Brett [name] higher, but still within goal range-<100, check Brett [name] in 3 mo.

## 2012-08-29 NOTE — Telephone Encounter (Signed)
Message copied by Gala Romney on Wed Aug 29, 2012  3:46 PM ------      Message from: Thornton Papas B      Created: Mon Aug 27, 2012  8:55 PM       Please call patient, ferritin higher, but still within goal range-<100, check ferritin in 3 mo.

## 2012-10-03 ENCOUNTER — Ambulatory Visit (INDEPENDENT_AMBULATORY_CARE_PROVIDER_SITE_OTHER): Payer: Medicare Other | Admitting: Ophthalmology

## 2012-10-03 DIAGNOSIS — I10 Essential (primary) hypertension: Secondary | ICD-10-CM

## 2012-10-03 DIAGNOSIS — E1139 Type 2 diabetes mellitus with other diabetic ophthalmic complication: Secondary | ICD-10-CM

## 2012-10-03 DIAGNOSIS — H35379 Puckering of macula, unspecified eye: Secondary | ICD-10-CM

## 2012-10-03 DIAGNOSIS — H2510 Age-related nuclear cataract, unspecified eye: Secondary | ICD-10-CM

## 2012-10-03 DIAGNOSIS — E11319 Type 2 diabetes mellitus with unspecified diabetic retinopathy without macular edema: Secondary | ICD-10-CM

## 2012-10-03 DIAGNOSIS — H43819 Vitreous degeneration, unspecified eye: Secondary | ICD-10-CM

## 2012-10-03 DIAGNOSIS — H35039 Hypertensive retinopathy, unspecified eye: Secondary | ICD-10-CM

## 2012-11-26 ENCOUNTER — Telehealth: Payer: Self-pay | Admitting: *Deleted

## 2012-11-26 ENCOUNTER — Ambulatory Visit (HOSPITAL_BASED_OUTPATIENT_CLINIC_OR_DEPARTMENT_OTHER): Payer: Medicare Other | Admitting: Oncology

## 2012-11-26 ENCOUNTER — Other Ambulatory Visit (HOSPITAL_BASED_OUTPATIENT_CLINIC_OR_DEPARTMENT_OTHER): Payer: Medicare Other | Admitting: Lab

## 2012-11-26 ENCOUNTER — Telehealth: Payer: Self-pay | Admitting: Oncology

## 2012-11-26 DIAGNOSIS — Z87891 Personal history of nicotine dependence: Secondary | ICD-10-CM | POA: Diagnosis not present

## 2012-11-26 LAB — FERRITIN: Ferritin: 83 ng/mL (ref 22–322)

## 2012-11-26 NOTE — Progress Notes (Signed)
   [facility]    OFFICE PROGRESS NOTE   INTERVAL HISTORY:   He returns as scheduled. No complaint. He last underwent phlebotomy therapy in February of 2013. He reports no problem with the phlebotomy treatments.  Objective:  Vital signs in last 24 hours:  Blood pressure 151/83, pulse 77, temperature 97.6 F (36.4 C), temperature source Oral, resp. rate 18, height 5\' 5"  (1.651 m), weight 197 lb 3.2 oz (89.449 kg). Resp: Lungs clear bilaterally Cardio: Regular rate and rhythm GI: No hepatosplenomegaly Vascular: No leg edema   Lab Results:  Lab Results  Component Value Date   WBC 6.1 11/15/2011   HGB 15.0 11/15/2011   HCT 42.2 11/15/2011   MCV 94.0 11/15/2011   PLT 178 11/15/2011   ferritin on 08/27/2012-98    Medications: I have reviewed the patient's current medications.  Assessment/Plan: 1. 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 He was last treated with phlebotomy therapy in February of 2013.  2. History of tobacco use in the remote past. 3. Hypertension. 4. Hypercholesterolemia. 5. Diabetes. 6. Gout. 7. Sleep apnea. 8. Family history of hemochromatosis.  Disposition:  He appears stable. The plan is to proceed with phlebotomy therapy if the ferritin returns at greater than 100 today. He will be scheduled for 6 phlebotomy treatments at a 2 week interval. The ferritin will be checked in 4 months and 8 months. Brett [name] will return for an office visit in one year.   Thornton Papas, MD  11/26/2012  9:39 AM

## 2012-11-26 NOTE — Telephone Encounter (Signed)
Per staff message and POF I have scheduled appts.  JMW  

## 2012-11-26 NOTE — Telephone Encounter (Signed)
Gave pt tenetative appt calendar for lab and MD, emailed Marcelino Duster regarding chemo

## 2012-11-27 ENCOUNTER — Telehealth: Payer: Self-pay | Admitting: *Deleted

## 2012-11-27 ENCOUNTER — Telehealth: Payer: Self-pay | Admitting: Oncology

## 2012-11-27 ENCOUNTER — Other Ambulatory Visit: Payer: Self-pay | Admitting: *Deleted

## 2012-11-27 NOTE — Telephone Encounter (Signed)
Message copied by Caleb Popp on Tue Nov 27, 2012  4:16 PM ------      Message from: Thornton Papas B      Created: Mon Nov 26, 2012  6:30 PM       Please call patient, ferritin is lower, cancel all scheduled phlebotomy treatments, check ferritin and cbc in 2 months

## 2012-11-27 NOTE — Telephone Encounter (Signed)
Called pt, ferritin results given. He understands phlebotomies will be canceled. Lab only in two months.

## 2012-11-27 NOTE — Telephone Encounter (Signed)
Talked to pt and he is aware of lab and MD for the whole year 2014

## 2012-12-03 DIAGNOSIS — Z79899 Other long term (current) drug therapy: Secondary | ICD-10-CM | POA: Diagnosis not present

## 2012-12-03 DIAGNOSIS — N529 Male erectile dysfunction, unspecified: Secondary | ICD-10-CM | POA: Diagnosis not present

## 2012-12-03 DIAGNOSIS — E119 Type 2 diabetes mellitus without complications: Secondary | ICD-10-CM | POA: Diagnosis not present

## 2012-12-03 DIAGNOSIS — I10 Essential (primary) hypertension: Secondary | ICD-10-CM | POA: Diagnosis not present

## 2012-12-03 DIAGNOSIS — E559 Vitamin D deficiency, unspecified: Secondary | ICD-10-CM | POA: Diagnosis not present

## 2012-12-03 DIAGNOSIS — E782 Mixed hyperlipidemia: Secondary | ICD-10-CM | POA: Diagnosis not present

## 2012-12-03 DIAGNOSIS — M109 Gout, unspecified: Secondary | ICD-10-CM | POA: Diagnosis not present

## 2012-12-12 ENCOUNTER — Other Ambulatory Visit: Payer: Medicare Other | Admitting: Lab

## 2012-12-26 ENCOUNTER — Other Ambulatory Visit: Payer: Medicare Other | Admitting: Lab

## 2013-01-22 ENCOUNTER — Other Ambulatory Visit (HOSPITAL_BASED_OUTPATIENT_CLINIC_OR_DEPARTMENT_OTHER): Payer: Medicare Other | Admitting: Lab

## 2013-01-22 LAB — CBC WITH DIFFERENTIAL/PLATELET
Basophils Absolute: 0 10*3/uL (ref 0.0–0.1)
Eosinophils Absolute: 0.1 10*3/uL (ref 0.0–0.5)
LYMPH%: 27.6 % (ref 14.0–49.0)
MCH: 32.2 pg (ref 27.2–33.4)
MCV: 89.2 fL (ref 79.3–98.0)
MONO%: 9 % (ref 0.0–14.0)
NEUT#: 3.7 10*3/uL (ref 1.5–6.5)
Platelets: 164 10*3/uL (ref 140–400)
RBC: 4.53 10*6/uL (ref 4.20–5.82)

## 2013-01-22 LAB — FERRITIN: Ferritin: 64 ng/mL (ref 22–322)

## 2013-01-23 ENCOUNTER — Telehealth: Payer: Self-pay | Admitting: *Deleted

## 2013-01-23 NOTE — Telephone Encounter (Signed)
Left ferritin results on home voice mail with instructions to follow up as scheduled.

## 2013-01-23 NOTE — Telephone Encounter (Signed)
Message copied by Wandalee Ferdinand on Wed Jan 23, 2013  2:23 PM ------      Message from: Brett [name]      Created: Tue Jan 22, 2013 10:02 PM       Please call patient, ferritin is better, f/u as scheduled ------

## 2013-03-05 DIAGNOSIS — E782 Mixed hyperlipidemia: Secondary | ICD-10-CM | POA: Diagnosis not present

## 2013-03-05 DIAGNOSIS — E119 Type 2 diabetes mellitus without complications: Secondary | ICD-10-CM | POA: Diagnosis not present

## 2013-03-05 DIAGNOSIS — Z79899 Other long term (current) drug therapy: Secondary | ICD-10-CM | POA: Diagnosis not present

## 2013-03-05 DIAGNOSIS — I10 Essential (primary) hypertension: Secondary | ICD-10-CM | POA: Diagnosis not present

## 2013-03-26 ENCOUNTER — Other Ambulatory Visit (HOSPITAL_BASED_OUTPATIENT_CLINIC_OR_DEPARTMENT_OTHER): Payer: Medicare Other | Admitting: Lab

## 2013-04-01 ENCOUNTER — Telehealth: Payer: Self-pay | Admitting: *Deleted

## 2013-04-01 NOTE — Telephone Encounter (Signed)
Message copied by Wandalee Ferdinand on Mon Apr 01, 2013 12:33 PM ------      Message from: Ladene Artist      Created: Tue Mar 26, 2013  9:05 PM       Please call patient, ferritin is in goal range, f/u as scheduled ------

## 2013-04-01 NOTE — Telephone Encounter (Signed)
Patient notified via voicemail.

## 2013-06-06 DIAGNOSIS — Z79899 Other long term (current) drug therapy: Secondary | ICD-10-CM | POA: Diagnosis not present

## 2013-06-06 DIAGNOSIS — E559 Vitamin D deficiency, unspecified: Secondary | ICD-10-CM | POA: Diagnosis not present

## 2013-06-06 DIAGNOSIS — I10 Essential (primary) hypertension: Secondary | ICD-10-CM | POA: Diagnosis not present

## 2013-06-06 DIAGNOSIS — E119 Type 2 diabetes mellitus without complications: Secondary | ICD-10-CM | POA: Diagnosis not present

## 2013-06-06 DIAGNOSIS — E782 Mixed hyperlipidemia: Secondary | ICD-10-CM | POA: Diagnosis not present

## 2013-07-26 ENCOUNTER — Other Ambulatory Visit (HOSPITAL_BASED_OUTPATIENT_CLINIC_OR_DEPARTMENT_OTHER): Payer: Medicare Other | Admitting: Lab

## 2013-07-29 ENCOUNTER — Telehealth: Payer: Self-pay | Admitting: *Deleted

## 2013-07-29 NOTE — Telephone Encounter (Signed)
Left VM to call office regarding lab results. 

## 2013-07-29 NOTE — Telephone Encounter (Signed)
Message copied by Wandalee Ferdinand on Mon Jul 29, 2013  2:17 PM ------      Message from: Thornton Papas B      Created: Sun Jul 28, 2013  8:53 AM       Please call patient, ferritin is elevated, scheduled phlebotomy q 2weeks for 4 treatments, let me know dates and I can put in order ------

## 2013-07-31 ENCOUNTER — Other Ambulatory Visit: Payer: Self-pay | Admitting: *Deleted

## 2013-07-31 ENCOUNTER — Other Ambulatory Visit: Payer: Self-pay | Admitting: Oncology

## 2013-07-31 ENCOUNTER — Telehealth: Payer: Self-pay | Admitting: *Deleted

## 2013-07-31 NOTE — Telephone Encounter (Signed)
Per staff message and POF I have scheduled appts.  JMW  

## 2013-07-31 NOTE — Progress Notes (Signed)
Pt returned call, lab results given. Ferritin is elevated. Per Dr. Truett Perna: Schedule for phlebotomy every 2 weeks for 4 treatments. Per MD: No CBC needed. Will check lab with Phlebotomy #3 only. Pt voiced understanding. Order sent to schedulers to call pt with appts. Pt requests AM appts.

## 2013-08-01 ENCOUNTER — Ambulatory Visit (HOSPITAL_BASED_OUTPATIENT_CLINIC_OR_DEPARTMENT_OTHER): Payer: Medicare Other

## 2013-08-01 ENCOUNTER — Other Ambulatory Visit: Payer: Self-pay | Admitting: *Deleted

## 2013-08-01 NOTE — Patient Instructions (Signed)

## 2013-08-15 ENCOUNTER — Ambulatory Visit (HOSPITAL_BASED_OUTPATIENT_CLINIC_OR_DEPARTMENT_OTHER): Payer: Medicare Other

## 2013-08-15 NOTE — Progress Notes (Signed)
Pt here for therapeutic phlebotomy as ordered per md.  Phlebotomy performed in left antecubital with 16G needle without difficulty.  Approx.  538g of blood obtained and wasted.  Pt tolerated procedure without problems.  Nourishments given.

## 2013-08-15 NOTE — Patient Instructions (Signed)
Therapeutic Phlebotomy Therapeutic phlebotomy is the controlled removal of blood from your body for the purpose of treating a medical condition. It is similar to donating blood. Usually, about a pint (470 mL) of blood is removed. The average adult has 9 to 12 pints (4.3 to 5.7 L) of blood. Therapeutic phlebotomy may be used to treat the following medical conditions:  Hemochromatosis. This is a condition in which there is too much iron in the blood.  Polycythemia vera. This is a condition in which there are too many red cells in the blood.  Porphyria cutanea tarda. This is a disease usually passed from one generation to the next (inherited). It is a condition in which an important part of hemoglobin is not made properly. This results in the build up of abnormal amounts of porphyrins in the body.  Sickle cell disease. This is an inherited disease. It is a condition in which the red blood cells form an abnormal crescent shape rather than a round shape. LET YOUR CAREGIVER KNOW ABOUT:  Allergies.  Medicines taken including herbs, eyedrops, over-the-counter medicines, and creams.  Use of steroids (by mouth or creams).  Previous problems with anesthetics or numbing medicine.  History of blood clots.  History of bleeding or blood problems.  Previous surgery.  Possibility of pregnancy, if this applies. RISKS AND COMPLICATIONS This is a simple and safe procedure. Problems are unlikely. However, problems can occur and may include:  Nausea or lightheadedness.  Low blood pressure.  Soreness, bleeding, swelling, or bruising at the needle insertion site.  Infection. BEFORE THE PROCEDURE  This is a procedure that can be done as an outpatient. Confirm the time that you need to arrive for your procedure. Confirm whether there is a need to fast or withhold any medications. It is helpful to wear clothing with sleeves that can be raised above the elbow. A blood sample may be done to determine the  amount of red blood cells or iron in your blood. Plan ahead of time to have someone drive you home after the procedure. PROCEDURE The entire procedure from preparation through recovery takes about 1 hour. The actual collection takes about 10 to 15 minutes.  A needle will be inserted into your vein.  Tubing and a collection bag will be attached to that needle.  Blood will flow through the needle and tubing into the collection bag.  You may be asked to open and close your hand slowly and continuously during the entire collection.  Once the specified amount of blood has been removed from your body, the collection bag and tubing will be clamped.  The needle will be removed.  Pressure will be held on the site of the needle insertion to stop the bleeding. Then a bandage will be placed over the needle insertion site. AFTER THE PROCEDURE  Your recovery will be assessed and monitored. If there are no problems, as an outpatient, you should be able to go home shortly after the procedure.  Document Released: 03/14/2011 Document Revised: 01/02/2012 Document Reviewed: 03/14/2011 [employer] Patient Information 2014 [employer]  

## 2013-08-29 ENCOUNTER — Ambulatory Visit (HOSPITAL_BASED_OUTPATIENT_CLINIC_OR_DEPARTMENT_OTHER): Payer: Medicare Other

## 2013-08-29 ENCOUNTER — Other Ambulatory Visit (HOSPITAL_BASED_OUTPATIENT_CLINIC_OR_DEPARTMENT_OTHER): Payer: Medicare Other | Admitting: Lab

## 2013-08-29 LAB — CBC WITH DIFFERENTIAL/PLATELET
BASO%: 0.6 % (ref 0.0–2.0)
EOS%: 1.6 % (ref 0.0–7.0)
Eosinophils Absolute: 0.1 10*3/uL (ref 0.0–0.5)
HCT: 39.8 % (ref 38.4–49.9)
LYMPH%: 27.7 % (ref 14.0–49.0)
MCH: 32.7 pg (ref 27.2–33.4)
MCHC: 35.3 g/dL (ref 32.0–36.0)
MONO%: 8.6 % (ref 0.0–14.0)
NEUT#: 3.8 10*3/uL (ref 1.5–6.5)
NEUT%: 61.5 % (ref 39.0–75.0)
Platelets: 195 10*3/uL (ref 140–400)
RDW: 13.6 % (ref 11.0–14.6)

## 2013-08-29 NOTE — Progress Notes (Signed)
Phlebotomy performed from 0925 to 0930.  Patient tolerated well.  16g blood collection kit used.  Pressure dressing applied to site.

## 2013-08-29 NOTE — Patient Instructions (Signed)
Therapeutic Phlebotomy Therapeutic phlebotomy is the controlled removal of blood from your body for the purpose of treating a medical condition. It is similar to donating blood. Usually, about a pint (470 mL) of blood is removed. The average adult has 9 to 12 pints (4.3 to 5.7 L) of blood. Therapeutic phlebotomy may be used to treat the following medical conditions:  Hemochromatosis. This is a condition in which there is too much iron in the blood.  Polycythemia vera. This is a condition in which there are too many red cells in the blood.  Porphyria cutanea tarda. This is a disease usually passed from one generation to the next (inherited). It is a condition in which an important part of hemoglobin is not made properly. This results in the build up of abnormal amounts of porphyrins in the body.  Sickle cell disease. This is an inherited disease. It is a condition in which the red blood cells form an abnormal crescent shape rather than a round shape. LET YOUR CAREGIVER KNOW ABOUT:  Allergies.  Medicines taken including herbs, eyedrops, over-the-counter medicines, and creams.  Use of steroids (by mouth or creams).  Previous problems with anesthetics or numbing medicine.  History of blood clots.  History of bleeding or blood problems.  Previous surgery.  Possibility of pregnancy, if this applies. RISKS AND COMPLICATIONS This is a simple and safe procedure. Problems are unlikely. However, problems can occur and may include:  Nausea or lightheadedness.  Low blood pressure.  Soreness, bleeding, swelling, or bruising at the needle insertion site.  Infection. BEFORE THE PROCEDURE  This is a procedure that can be done as an outpatient. Confirm the time that you need to arrive for your procedure. Confirm whether there is a need to fast or withhold any medications. It is helpful to wear clothing with sleeves that can be raised above the elbow. A blood sample may be done to determine the  amount of red blood cells or iron in your blood. Plan ahead of time to have someone drive you home after the procedure. PROCEDURE The entire procedure from preparation through recovery takes about 1 hour. The actual collection takes about 10 to 15 minutes.  A needle will be inserted into your vein.  Tubing and a collection bag will be attached to that needle.  Blood will flow through the needle and tubing into the collection bag.  You may be asked to open and close your hand slowly and continuously during the entire collection.  Once the specified amount of blood has been removed from your body, the collection bag and tubing will be clamped.  The needle will be removed.  Pressure will be held on the site of the needle insertion to stop the bleeding. Then a bandage will be placed over the needle insertion site. AFTER THE PROCEDURE  Your recovery will be assessed and monitored. If there are no problems, as an outpatient, you should be able to go home shortly after the procedure.  Document Released: 03/14/2011 Document Revised: 01/02/2012 Document Reviewed: 03/14/2011 [employer] Patient Information 2014 [employer]  

## 2013-08-29 NOTE — Progress Notes (Signed)
Discharged at 1005 alone and ambulatory.

## 2013-08-30 ENCOUNTER — Other Ambulatory Visit: Payer: Medicare Other | Admitting: Lab

## 2013-09-06 ENCOUNTER — Other Ambulatory Visit: Payer: Self-pay | Admitting: Internal Medicine

## 2013-09-10 ENCOUNTER — Encounter: Payer: Self-pay | Admitting: Internal Medicine

## 2013-09-10 DIAGNOSIS — I420 Dilated cardiomyopathy: Secondary | ICD-10-CM | POA: Insufficient documentation

## 2013-09-10 DIAGNOSIS — I10 Essential (primary) hypertension: Secondary | ICD-10-CM | POA: Insufficient documentation

## 2013-09-10 DIAGNOSIS — M109 Gout, unspecified: Secondary | ICD-10-CM | POA: Insufficient documentation

## 2013-09-10 DIAGNOSIS — G4733 Obstructive sleep apnea (adult) (pediatric): Secondary | ICD-10-CM | POA: Insufficient documentation

## 2013-09-10 DIAGNOSIS — E1122 Type 2 diabetes mellitus with diabetic chronic kidney disease: Secondary | ICD-10-CM | POA: Insufficient documentation

## 2013-09-11 ENCOUNTER — Other Ambulatory Visit: Payer: Self-pay | Admitting: Emergency Medicine

## 2013-09-11 ENCOUNTER — Encounter: Payer: Self-pay | Admitting: Emergency Medicine

## 2013-09-11 ENCOUNTER — Ambulatory Visit: Payer: Medicare Other | Admitting: Emergency Medicine

## 2013-09-11 VITALS — BP 138/82 | Temp 98.6°F | Resp 18 | Ht 65.0 in | Wt 185.0 lb

## 2013-09-11 DIAGNOSIS — E559 Vitamin D deficiency, unspecified: Secondary | ICD-10-CM | POA: Diagnosis not present

## 2013-09-11 DIAGNOSIS — E782 Mixed hyperlipidemia: Secondary | ICD-10-CM

## 2013-09-11 DIAGNOSIS — Z125 Encounter for screening for malignant neoplasm of prostate: Secondary | ICD-10-CM | POA: Diagnosis not present

## 2013-09-11 DIAGNOSIS — Z23 Encounter for immunization: Secondary | ICD-10-CM | POA: Diagnosis not present

## 2013-09-11 DIAGNOSIS — Z0000 Encounter for general adult medical examination without abnormal findings: Secondary | ICD-10-CM

## 2013-09-11 DIAGNOSIS — E119 Type 2 diabetes mellitus without complications: Secondary | ICD-10-CM

## 2013-09-11 DIAGNOSIS — R5381 Other malaise: Secondary | ICD-10-CM | POA: Diagnosis not present

## 2013-09-11 DIAGNOSIS — I10 Essential (primary) hypertension: Secondary | ICD-10-CM

## 2013-09-11 DIAGNOSIS — M109 Gout, unspecified: Secondary | ICD-10-CM | POA: Diagnosis not present

## 2013-09-11 DIAGNOSIS — Z1212 Encounter for screening for malignant neoplasm of rectum: Secondary | ICD-10-CM | POA: Diagnosis not present

## 2013-09-11 LAB — CBC WITH DIFFERENTIAL/PLATELET
Basophils Absolute: 0 10*3/uL (ref 0.0–0.1)
Basophils Relative: 0 % (ref 0–1)
Eosinophils Absolute: 0.1 10*3/uL (ref 0.0–0.7)
HCT: 42.4 % (ref 39.0–52.0)
Lymphocytes Relative: 26 % (ref 12–46)
MCHC: 36.8 g/dL — ABNORMAL HIGH (ref 30.0–36.0)
Monocytes Absolute: 0.6 10*3/uL (ref 0.1–1.0)
Monocytes Relative: 10 % (ref 3–12)
Neutro Abs: 3.8 10*3/uL (ref 1.7–7.7)
Neutrophils Relative %: 62 % (ref 43–77)
RDW: 13.5 % (ref 11.5–15.5)
WBC: 6.2 10*3/uL (ref 4.0–10.5)

## 2013-09-11 LAB — HEPATIC FUNCTION PANEL
ALT: 19 U/L (ref 0–53)
AST: 17 U/L (ref 0–37)
Albumin: 5 g/dL (ref 3.5–5.2)
Alkaline Phosphatase: 44 U/L (ref 39–117)
Total Protein: 7.1 g/dL (ref 6.0–8.3)

## 2013-09-11 LAB — LIPID PANEL
LDL Cholesterol: 57 mg/dL (ref 0–99)
Total CHOL/HDL Ratio: 2.6 Ratio
Triglycerides: 142 mg/dL (ref <150)
VLDL: 28 mg/dL (ref 0–40)

## 2013-09-11 LAB — BASIC METABOLIC PANEL WITH GFR
BUN: 16 mg/dL (ref 6–23)
CO2: 29 mEq/L (ref 19–32)
Chloride: 98 mEq/L (ref 96–112)
Creat: 0.92 mg/dL (ref 0.50–1.35)
Glucose, Bld: 197 mg/dL — ABNORMAL HIGH (ref 70–99)

## 2013-09-11 LAB — MAGNESIUM: Magnesium: 1.9 mg/dL (ref 1.5–2.5)

## 2013-09-11 LAB — TSH: TSH: 3.717 u[IU]/mL (ref 0.350–4.500)

## 2013-09-11 NOTE — Patient Instructions (Signed)
Fat and Cholesterol Control Diet Fat and cholesterol levels in your blood and organs are influenced by your diet. High levels of fat and cholesterol may lead to diseases of the heart, small and large blood vessels, gallbladder, liver, and pancreas. CONTROLLING FAT AND CHOLESTEROL WITH DIET Although exercise and lifestyle factors are important, your diet is key. That is because certain foods are known to raise cholesterol and others to lower it. The goal is to balance foods for their effect on cholesterol and more importantly, to replace saturated and trans fat with other types of fat, such as monounsaturated fat, polyunsaturated fat, and omega-3 fatty acids. On average, a person should consume no more than 15 to 17 g of saturated fat daily. Saturated and trans fats are considered "bad" fats, and they will raise LDL cholesterol. Saturated fats are primarily found in animal products such as meats, butter, and cream. However, that does not mean you need to give up all your favorite foods. Today, there are good tasting, low-fat, low-cholesterol substitutes for most of the things you like to eat. Choose low-fat or nonfat alternatives. Choose round or loin cuts of red meat. These types of cuts are lowest in fat and cholesterol. Chicken (without the skin), fish, veal, and ground turkey breast are great choices. Eliminate fatty meats, such as hot dogs and salami. Even shellfish have little or no saturated fat. Have a 3 oz (85 g) portion when you eat lean meat, poultry, or fish. Trans fats are also called "partially hydrogenated oils." They are oils that have been scientifically manipulated so that they are solid at room temperature resulting in a longer shelf life and improved taste and texture of foods in which they are added. Trans fats are found in stick margarine, some tub margarines, cookies, crackers, and baked goods.  When baking and cooking, oils are a great substitute for butter. The monounsaturated oils are  especially beneficial since it is believed they lower LDL and raise HDL. The oils you should avoid entirely are saturated tropical oils, such as coconut and palm.  Remember to eat a lot from food groups that are naturally free of saturated and trans fat, including fish, fruit, vegetables, beans, grains (barley, rice, couscous, bulgur wheat), and pasta (without cream sauces).  IDENTIFYING FOODS THAT LOWER FAT AND CHOLESTEROL  Soluble fiber may lower your cholesterol. This type of fiber is found in fruits such as apples, vegetables such as broccoli, potatoes, and carrots, legumes such as beans, peas, and lentils, and grains such as barley. Foods fortified with plant sterols (phytosterol) may also lower cholesterol. You should eat at least 2 g per day of these foods for a cholesterol lowering effect.  Read package labels to identify low-saturated fats, trans fat free, and low-fat foods at the supermarket. Select cheeses that have only 2 to 3 g saturated fat per ounce. Use a heart-healthy tub margarine that is free of trans fats or partially hydrogenated oil. When buying baked goods (cookies, crackers), avoid partially hydrogenated oils. Breads and muffins should be made from whole grains (whole-wheat or whole oat flour, instead of "flour" or "enriched flour"). Buy non-creamy canned soups with reduced salt and no added fats.  FOOD PREPARATION TECHNIQUES  Never deep-fry. If you must fry, either stir-fry, which uses very little fat, or use non-stick cooking sprays. When possible, broil, bake, or roast meats, and steam vegetables. Instead of putting butter or margarine on vegetables, use lemon and herbs, applesauce, and cinnamon (for squash and sweet potatoes). Use nonfat   yogurt, salsa, and low-fat dressings for salads.  LOW-SATURATED FAT / LOW-FAT FOOD SUBSTITUTES Meats / Saturated Fat (g)  Avoid: Steak, marbled (3 oz/85 g) / 11 g  Choose: Steak, lean (3 oz/85 g) / 4 g  Avoid: Hamburger (3 oz/85 g) / 7  g  Choose: Hamburger, lean (3 oz/85 g) / 5 g  Avoid: Ham (3 oz/85 g) / 6 g  Choose: Ham, lean cut (3 oz/85 g) / 2.4 g  Avoid: Chicken, with skin, dark meat (3 oz/85 g) / 4 g  Choose: Chicken, skin removed, dark meat (3 oz/85 g) / 2 g  Avoid: Chicken, with skin, light meat (3 oz/85 g) / 2.5 g  Choose: Chicken, skin removed, light meat (3 oz/85 g) / 1 g Dairy / Saturated Fat (g)  Avoid: Whole milk (1 cup) / 5 g  Choose: Low-fat milk, 2% (1 cup) / 3 g  Choose: Low-fat milk, 1% (1 cup) / 1.5 g  Choose: Skim milk (1 cup) / 0.3 g  Avoid: Hard cheese (1 oz/28 g) / 6 g  Choose: Skim milk cheese (1 oz/28 g) / 2 to 3 g  Avoid: Cottage cheese, 4% fat (1 cup) / 6.5 g  Choose: Low-fat cottage cheese, 1% fat (1 cup) / 1.5 g  Avoid: Ice cream (1 cup) / 9 g  Choose: Sherbet (1 cup) / 2.5 g  Choose: Nonfat frozen yogurt (1 cup) / 0.3 g  Choose: Frozen fruit bar / trace  Avoid: Whipped cream (1 tbs) / 3.5 g  Choose: Nondairy whipped topping (1 tbs) / 1 g Condiments / Saturated Fat (g)  Avoid: Mayonnaise (1 tbs) / 2 g  Choose: Low-fat mayonnaise (1 tbs) / 1 g  Avoid: Butter (1 tbs) / 7 g  Choose: Extra light margarine (1 tbs) / 1 g  Avoid: Coconut oil (1 tbs) / 11.8 g  Choose: Olive oil (1 tbs) / 1.8 g  Choose: Corn oil (1 tbs) / 1.7 g  Choose: Safflower oil (1 tbs) / 1.2 g  Choose: Sunflower oil (1 tbs) / 1.4 g  Choose: Soybean oil (1 tbs) / 2.4 g  Choose: Canola oil (1 tbs) / 1 g Document Released: 10/10/2005 Document Revised: 02/04/2013 Document Reviewed: 03/31/2011 [employer] Patient Information 2014 [employer]. Diabetes Meal Planning Guide The diabetes meal planning guide is a tool to help you plan your meals and snacks. It is important for people with diabetes to manage their blood glucose (sugar) levels. Choosing the right foods and the right amounts throughout your day will help control your blood glucose. Eating right can even help you improve your blood  pressure and reach or maintain a healthy weight. CARBOHYDRATE COUNTING MADE EASY When you eat carbohydrates, they turn to sugar. This raises your blood glucose level. Counting carbohydrates can help you control this level so you feel better. When you plan your meals by counting carbohydrates, you can have more flexibility in what you eat and balance your medicine with your food intake. Carbohydrate counting simply means adding up the total amount of carbohydrate grams in your meals and snacks. Try to eat about the same amount at each meal. Foods with carbohydrates are listed below. Each portion below is 1 carbohydrate serving or 15 grams of carbohydrates. Ask your dietician how many grams of carbohydrates you should eat at each meal or snack. Grains and Starches  1 slice bread.   English muffin or hotdog/hamburger bun.   cup cold cereal (unsweetened).   cup cooked   pasta or rice.   cup starchy vegetables (corn, potatoes, peas, beans, winter squash).  1 tortilla (6 inches).   bagel.  1 waffle or pancake (size of a CD).   cup cooked cereal.  4 to 6 small crackers. *Whole grain is recommended. Fruit  1 cup fresh unsweetened berries, melon, papaya, pineapple.  1 small fresh fruit.   banana or mango.   cup fruit juice (4 oz unsweetened).   cup canned fruit in natural juice or water.  2 tbs dried fruit.  12 to 15 grapes or cherries. Milk and Yogurt  1 cup fat-free or 1% milk.  1 cup soy milk.  6 oz light yogurt with sugar-free sweetener.  6 oz low-fat soy yogurt.  6 oz plain yogurt. Vegetables  1 cup raw or  cup cooked is counted as 0 carbohydrates or a "free" food.  If you eat 3 or more servings at 1 meal, count them as 1 carbohydrate serving. Other Carbohydrates   oz chips or pretzels.   cup ice cream or frozen yogurt.   cup sherbet or sorbet.  2 inch square cake, no frosting.  1 tbs honey, sugar, jam, jelly, or syrup.  2 small cookies.  3  squares of graham crackers.  3 cups popcorn.  6 crackers.  1 cup broth-based soup.  Count 1 cup casserole or other mixed foods as 2 carbohydrate servings.  Foods with less than 20 calories in a serving may be counted as 0 carbohydrates or a "free" food. You may want to purchase a book or computer software that lists the carbohydrate gram counts of different foods. In addition, the nutrition facts panel on the labels of the foods you eat are a good source of this information. The label will tell you how big the serving size is and the total number of carbohydrate grams you will be eating per serving. Divide this number by 15 to obtain the number of carbohydrate servings in a portion. Remember, 1 carbohydrate serving equals 15 grams of carbohydrate. SERVING SIZES Measuring foods and serving sizes helps you make sure you are getting the right amount of food. The list below tells how big or small some common serving sizes are.  1 oz.........4 stacked dice.  3 oz.........Deck of cards.  1 tsp........Tip of little finger.  1 tbs........Thumb.  2 tbs........Golf ball.   cup.......Half of a fist.  1 cup........A fist. SAMPLE DIABETES MEAL PLAN Below is a sample meal plan that includes foods from the grain and starches, dairy, vegetable, fruit, and meat groups. A dietician can individualize a meal plan to fit your calorie needs and tell you the number of servings needed from each food group. However, controlling the total amount of carbohydrates in your meal or snack is more important than making sure you include all of the food groups at every meal. You may interchange carbohydrate containing foods (dairy, starches, and fruits). The meal plan below is an example of a 2000 calorie diet using carbohydrate counting. This meal plan has 17 carbohydrate servings. Breakfast  1 cup oatmeal (2 carb servings).   cup light yogurt (1 carb serving).  1 cup blueberries (1 carb serving).   cup  almonds. Snack  1 large apple (2 carb servings).  1 low-fat string cheese stick. Lunch  Chicken breast salad.  1 cup spinach.   cup chopped tomatoes.  2 oz chicken breast, sliced.  2 tbs low-fat Italian dressing.  12 whole-wheat crackers (2 carb servings).  12 to 15 grapes (  1 carb serving).  1 cup low-fat milk (1 carb serving). Snack  1 cup carrots.   cup hummus (1 carb serving). Dinner  3 oz broiled salmon.  1 cup brown rice (3 carb servings). Snack  1  cups steamed broccoli (1 carb serving) drizzled with 1 tsp olive oil and lemon juice.  1 cup light pudding (2 carb servings). DIABETES MEAL PLANNING WORKSHEET Your dietician can use this worksheet to help you decide how many servings of foods and what types of foods are right for you.  BREAKFAST Food Group and Servings / Carb Servings Grain/Starches __________________________________ Dairy __________________________________________ Vegetable ______________________________________ Fruit ___________________________________________ Meat __________________________________________ Fat ____________________________________________ LUNCH Food Group and Servings / Carb Servings Grain/Starches ___________________________________ Dairy ___________________________________________ Fruit ____________________________________________ Meat ___________________________________________ Fat _____________________________________________ DINNER Food Group and Servings / Carb Servings Grain/Starches ___________________________________ Dairy ___________________________________________ Fruit ____________________________________________ Meat ___________________________________________ Fat _____________________________________________ SNACKS Food Group and Servings / Carb Servings Grain/Starches ___________________________________ Dairy ___________________________________________ Vegetable  _______________________________________ Fruit ____________________________________________ Meat ___________________________________________ Fat _____________________________________________ DAILY TOTALS Starches _________________________ Vegetable ________________________ Fruit ____________________________ Dairy ____________________________ Meat ____________________________ Fat ______________________________ Document Released: 07/07/2005 Document Revised: 01/02/2012 Document Reviewed: 05/18/2009 [employer] Patient Information 2014 [employer].  

## 2013-09-12 ENCOUNTER — Other Ambulatory Visit: Payer: Self-pay | Admitting: Oncology

## 2013-09-12 ENCOUNTER — Ambulatory Visit (HOSPITAL_BASED_OUTPATIENT_CLINIC_OR_DEPARTMENT_OTHER): Payer: Medicare Other

## 2013-09-12 ENCOUNTER — Encounter: Payer: Self-pay | Admitting: Emergency Medicine

## 2013-09-12 LAB — URINALYSIS, ROUTINE W REFLEX MICROSCOPIC
Glucose, UA: NEGATIVE mg/dL
Ketones, ur: NEGATIVE mg/dL
Leukocytes, UA: NEGATIVE
Nitrite: NEGATIVE
Protein, ur: NEGATIVE mg/dL
Urobilinogen, UA: 0.2 mg/dL (ref 0.0–1.0)

## 2013-09-12 LAB — MICROALBUMIN / CREATININE URINE RATIO: Microalb, Ur: 0.5 mg/dL (ref 0.00–1.89)

## 2013-09-12 NOTE — Progress Notes (Signed)
Phlebotomy performed per MD order without any complications. 1 unit of blood removed. Patient observed for 30 mins after phlebotomy and snack given to patient. Angelena Form, RN

## 2013-09-12 NOTE — Progress Notes (Signed)
Subjective:    Patient ID: Brett [name], male    DOB: 05/31/1946, 67 y.o.   MRN: [id number]  HPI Comments: 67 yo male presents for CPE and 3 month F/U for HTN, Cholesterol, DM, D. Deficient. He is doing well overall. He has been trying to increase exercise/ activity. He is eating better and less with Phentermine Rx. He has lost 11# since last year. His BS are averaging 120s with weight loss. His BP has been good at home. He has f/u with Dr. Truett Perna for hemochromatosis later this month and eye doctor in 12/14.    Hypertension  Hyperlipidemia  Diabetes    Current Outpatient Prescriptions on File Prior to Visit  Medication Sig Dispense Refill  . allopurinol (ZYLOPRIM) 300 MG tablet Take 300 mg by mouth daily.        Marland Kitchen aspirin 81 MG tablet Take 81 mg by mouth daily.        Marland Kitchen atorvastatin (LIPITOR) 20 MG tablet Take 20 mg by mouth daily.        . Cholecalciferol (VITAMIN D) 1000 UNITS capsule Take 1,000 Units by mouth 2 (two) times daily.        . furosemide (LASIX) 20 MG tablet TAKE 1 TABLET BY MOUTH TWICE A DAY FOR BLOOD PRESSURE AND FLUID  60 tablet  1  . Loratadine (CLARITIN PO) Take by mouth daily.        . magnesium oxide (MAG-OX) 400 MG tablet Take 400 mg by mouth daily.      . MetFORMIN HCl (GLUCOPHAGE PO) Take 500 mg by mouth 4 (four) times daily. Unknown dose      . minoxidil (LONITEN) 10 MG tablet Take 10 mg by mouth daily.         No current facility-administered medications on file prior to visit.   Review of patient's allergies indicates no known allergies.  Past Medical History  Diagnosis Date  . Diabetes mellitus without complication   . OSA (obstructive sleep apnea)   . Allergy   . Gout   . Hemochromatosis   . Dilated cardiomyopathy     non isch  . Hypertension   . Colon polyp     Past Surgical History  Procedure Laterality Date  . Eye surgery Bilateral     Cataract   History   Social History  . Marital Status: Single    Spouse Name: N/A    Number  of Children: N/A  . Years of Education: N/A   Social History Main Topics  . Smoking status: Former Smoker    Quit date: 11/24/1966  . Smokeless tobacco: Never Used  . Alcohol Use: Yes     Comment: Occasionally  . Drug Use: No  . Sexual Activity: None   Other Topics Concern  . None   Social History Narrative  . None   Family History  Problem Relation Age of Onset  . Diabetes Father   . Diabetes Other   . Hypertension Other   . Hyperlipidemia Other   . Heart disease Other   . Cancer Maternal Uncle     brain       Review of Systems  Eyes:       Yearly visit due 12/14    Hematological:       Dr. Truett Perna due 11/14  All other systems reviewed and are negative.   BP 138/82  Temp(Src) 98.6 F (37 C) (Temporal)  Resp 18  Ht 5\' 5"  (1.651 m)  Wt 185 lb (  83.915 kg)  BMI 30.79 kg/m2     Objective:   Physical Exam  Nursing note and vitals reviewed. Constitutional: He is oriented to person, place, and time. He appears well-developed and well-nourished.  HENT:  Head: Normocephalic and atraumatic.  Right Ear: External ear normal.  Left Ear: External ear normal.  Nose: Nose normal.  Mouth/Throat: Oropharynx is clear and moist.  Eyes: Conjunctivae and EOM are normal. Pupils are equal, round, and reactive to light. No scleral icterus.  Neck: Normal range of motion. Neck supple. No JVD present. No thyromegaly present.  Cardiovascular: Normal rate, regular rhythm and intact distal pulses.  Exam reveals no gallop and no friction rub.   No murmur heard. Pulmonary/Chest: Effort normal and breath sounds normal.  Abdominal: Soft. Bowel sounds are normal. He exhibits no distension and no mass. There is no tenderness. There is no rebound and no guarding.  Genitourinary:  Def til 2015  Musculoskeletal: Normal range of motion. He exhibits no edema and no tenderness.  Lymphadenopathy:    He has no cervical adenopathy.  Neurological: He is alert and oriented to person, place,  and time. He has normal reflexes. He displays normal reflexes. No cranial nerve deficit. He exhibits normal muscle tone. Coordination normal.  Skin: Skin is warm and dry. No rash noted. No erythema. No pallor.  Psychiatric: He has a normal mood and affect. His behavior is normal. Judgment and thought content normal.      EKG NSCSPT LBBB    Assessment & Plan:  1. CPE and presents for 3 month F/U for HTN, Cholesterol, DM, D. Deficient recheck labs, continue weight loss and better diet, increase cardio. Call if BP >130/ 90

## 2013-09-12 NOTE — Patient Instructions (Signed)

## 2013-09-13 LAB — PSA: PSA: 0.49 ng/mL (ref <=4.00)

## 2013-09-13 LAB — VITAMIN D 25 HYDROXY (VIT D DEFICIENCY, FRACTURES): Vit D, 25-Hydroxy: 57 ng/mL (ref 30–89)

## 2013-10-03 ENCOUNTER — Ambulatory Visit (INDEPENDENT_AMBULATORY_CARE_PROVIDER_SITE_OTHER): Payer: Medicare Other | Admitting: Ophthalmology

## 2013-10-09 ENCOUNTER — Ambulatory Visit (INDEPENDENT_AMBULATORY_CARE_PROVIDER_SITE_OTHER): Payer: Medicare Other | Admitting: Ophthalmology

## 2013-10-09 DIAGNOSIS — H43819 Vitreous degeneration, unspecified eye: Secondary | ICD-10-CM

## 2013-10-09 DIAGNOSIS — E1139 Type 2 diabetes mellitus with other diabetic ophthalmic complication: Secondary | ICD-10-CM | POA: Diagnosis not present

## 2013-10-09 DIAGNOSIS — E11319 Type 2 diabetes mellitus with unspecified diabetic retinopathy without macular edema: Secondary | ICD-10-CM

## 2013-10-09 DIAGNOSIS — H35379 Puckering of macula, unspecified eye: Secondary | ICD-10-CM

## 2013-10-09 DIAGNOSIS — H35039 Hypertensive retinopathy, unspecified eye: Secondary | ICD-10-CM

## 2013-10-09 DIAGNOSIS — I10 Essential (primary) hypertension: Secondary | ICD-10-CM

## 2013-10-09 DIAGNOSIS — H2510 Age-related nuclear cataract, unspecified eye: Secondary | ICD-10-CM

## 2013-11-07 ENCOUNTER — Other Ambulatory Visit: Payer: Self-pay | Admitting: Internal Medicine

## 2013-11-07 MED ORDER — BISOPROLOL-HYDROCHLOROTHIAZIDE 5-6.25 MG PO TABS: 1.0000 | ORAL_TABLET | Freq: Every day | ORAL | Status: DC

## 2013-11-20 ENCOUNTER — Other Ambulatory Visit: Payer: Self-pay | Admitting: Physician Assistant

## 2013-11-20 MED ORDER — MINOXIDIL 10 MG PO TABS
10.0000 mg | ORAL_TABLET | Freq: Every day | ORAL | Status: DC
Start: 2013-11-20 — End: 2014-07-28

## 2013-11-20 MED ORDER — ALLOPURINOL 300 MG PO TABS: 300.0000 mg | ORAL_TABLET | Freq: Every day | ORAL | Status: DC

## 2013-11-20 MED ORDER — ATORVASTATIN CALCIUM 20 MG PO TABS
20.0000 mg | ORAL_TABLET | Freq: Every day | ORAL | Status: DC
Start: 2013-11-20 — End: 2014-08-25

## 2013-11-26 ENCOUNTER — Telehealth: Payer: Self-pay | Admitting: Oncology

## 2013-11-26 ENCOUNTER — Ambulatory Visit (HOSPITAL_BASED_OUTPATIENT_CLINIC_OR_DEPARTMENT_OTHER): Payer: Medicare Other | Admitting: Oncology

## 2013-11-26 ENCOUNTER — Other Ambulatory Visit (HOSPITAL_BASED_OUTPATIENT_CLINIC_OR_DEPARTMENT_OTHER): Payer: Medicare Other

## 2013-11-26 DIAGNOSIS — E119 Type 2 diabetes mellitus without complications: Secondary | ICD-10-CM | POA: Diagnosis not present

## 2013-11-26 DIAGNOSIS — E785 Hyperlipidemia, unspecified: Secondary | ICD-10-CM

## 2013-11-26 DIAGNOSIS — I10 Essential (primary) hypertension: Secondary | ICD-10-CM

## 2013-11-26 LAB — FERRITIN CHCC: Ferritin: 31 ng/ml (ref 22–316)

## 2013-11-26 NOTE — Progress Notes (Signed)
   [facility]    OFFICE PROGRESS NOTE   INTERVAL HISTORY:   He returns for scheduled followup of hemochromatosis. He underwent a series of phlebotomy treatments last fall. He tolerated phlebotomy well. No complaint today. He reports developing a cough after receiving the flu shot.  Objective:  Vital signs in last 24 hours:  Blood pressure 153/90, pulse 73, temperature 98.6 F (37 C), temperature source Oral, resp. rate 18, height 5\' 5"  (1.651 m), weight 187 lb (84.823 kg), SpO2 98.00%.   Resp: Lungs clear bilaterally Cardio: Distant heart sounds, regular rate and rhythm GI: No hepatosplenomegaly Vascular: No leg edema   Lab Results:  Ferritin level pending   Medications: I have reviewed the patient's current medications.  Assessment/Plan: 1. 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 He was last treated with phlebotomy therapy in November of 2014 . 2. History of tobacco use in the remote past. 3. Hypertension. 4. Hypercholesterolemia. 5. Diabetes. 6. Gout. 7. Sleep apnea. 8. Family history of hemochromatosis.    Disposition:  He appears unchanged. We will followup on the ferritin level from today. He will return for a ferritin level in 4 months. He will be scheduled for an eight-month office visit. The plan is to resume phlebotomy therapy for a ferritin of greater than 100.   Betsy Coder, MD  11/26/2013  9:38 AM

## 2013-11-26 NOTE — Telephone Encounter (Signed)
s.w. pt and advised on OCT appt....pt ok adn aware °

## 2013-11-27 ENCOUNTER — Telehealth: Payer: Self-pay | Admitting: *Deleted

## 2013-11-27 NOTE — Telephone Encounter (Signed)
Called pt with ferritin results: Looks good, follow up as scheduled. He voiced understanding.

## 2013-11-27 NOTE — Telephone Encounter (Signed)
Message copied by Brien Few on Wed Nov 27, 2013  9:12 AM ------      Message from: [name]      Created: Tue Nov 26, 2013  6:32 PM       Please call patient, ferritin looks good,f/u as scheduled ------

## 2014-01-08 ENCOUNTER — Other Ambulatory Visit: Payer: Self-pay | Admitting: *Deleted

## 2014-01-08 MED ORDER — LOSARTAN POTASSIUM 100 MG PO TABS: 100.0000 mg | ORAL_TABLET | Freq: Every day | ORAL | Status: DC

## 2014-01-08 MED ORDER — METFORMIN HCL ER 500 MG PO TB24: 1000.0000 mg | ORAL_TABLET | Freq: Two times a day (BID) | ORAL | Status: DC

## 2014-01-20 ENCOUNTER — Other Ambulatory Visit: Payer: Self-pay | Admitting: Physician Assistant

## 2014-01-21 ENCOUNTER — Encounter: Payer: Self-pay | Admitting: Internal Medicine

## 2014-01-21 ENCOUNTER — Ambulatory Visit (INDEPENDENT_AMBULATORY_CARE_PROVIDER_SITE_OTHER): Payer: Medicare Other | Admitting: Internal Medicine

## 2014-01-21 VITALS — BP 140/82 | HR 72 | Temp 97.6°F | Resp 16 | Ht 64.75 in | Wt 190.6 lb

## 2014-01-21 DIAGNOSIS — E1169 Type 2 diabetes mellitus with other specified complication: Secondary | ICD-10-CM | POA: Insufficient documentation

## 2014-01-21 DIAGNOSIS — E559 Vitamin D deficiency, unspecified: Secondary | ICD-10-CM | POA: Diagnosis not present

## 2014-01-21 DIAGNOSIS — E1129 Type 2 diabetes mellitus with other diabetic kidney complication: Secondary | ICD-10-CM | POA: Diagnosis not present

## 2014-01-21 DIAGNOSIS — I10 Essential (primary) hypertension: Secondary | ICD-10-CM | POA: Diagnosis not present

## 2014-01-21 DIAGNOSIS — Z23 Encounter for immunization: Secondary | ICD-10-CM

## 2014-01-21 DIAGNOSIS — Z79899 Other long term (current) drug therapy: Secondary | ICD-10-CM | POA: Diagnosis not present

## 2014-01-21 DIAGNOSIS — E782 Mixed hyperlipidemia: Secondary | ICD-10-CM | POA: Diagnosis not present

## 2014-01-21 LAB — HEMOGLOBIN A1C
HEMOGLOBIN A1C: 7.4 % — AB (ref <5.7)
Mean Plasma Glucose: 166 mg/dL — ABNORMAL HIGH (ref <117)

## 2014-01-21 LAB — CBC WITH DIFFERENTIAL/PLATELET
Basophils Absolute: 0 K/uL (ref 0.0–0.1)
Basophils Relative: 0 % (ref 0–1)
Eosinophils Absolute: 0.1 K/uL (ref 0.0–0.7)
Eosinophils Relative: 2 % (ref 0–5)
HCT: 39.8 % (ref 39.0–52.0)
Hemoglobin: 14.5 g/dL (ref 13.0–17.0)
Lymphocytes Relative: 27 % (ref 12–46)
Lymphs Abs: 1.7 K/uL (ref 0.7–4.0)
MCH: 30.9 pg (ref 26.0–34.0)
MCHC: 36.4 g/dL — ABNORMAL HIGH (ref 30.0–36.0)
MCV: 84.7 fL (ref 78.0–100.0)
Monocytes Absolute: 0.6 K/uL (ref 0.1–1.0)
Monocytes Relative: 10 % (ref 3–12)
Neutro Abs: 3.8 K/uL (ref 1.7–7.7)
Neutrophils Relative %: 61 % (ref 43–77)
Platelets: 188 K/uL (ref 150–400)
RBC: 4.7 MIL/uL (ref 4.22–5.81)
RDW: 14.5 % (ref 11.5–15.5)
WBC: 6.3 K/uL (ref 4.0–10.5)

## 2014-01-21 NOTE — Patient Instructions (Signed)

## 2014-01-21 NOTE — Progress Notes (Signed)
Patient ID: Brett [name], male   DOB: 1946/04/13, 68 y.o.   MRN: [id number]    This very nice 68 y.o. DWM presents for 3 month follow up with Hypertension, Hyperlipidemia,  Pre-Diabetes and Vitamin D Deficiency. 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   HTN predates many years. BP has been controlled at home. Today's BP was 140/82 mmHg.  He had a negative heart cath in 2006. Patient denies any cardiac type chest pain, palpitations, dyspnea/orthopnea/PND, dizziness, claudication, or dependent edema.   Hyperlipidemia is controlled with diet & meds. Last Cholesterol was 137, Triglycerides were 142 , HDL 52  and LDL 57 in Nov 2014 - at goal. Patient denies myalgias or other med SE's.    Also, the patient has history of T2 IDDM  since 2006 with last A1c of  8.4% in Aug 2014. He reports CBG's range 100-120 mg%.GFR calculated 74 in Aug consistent with Stage II CKD.  Patient denies any symptoms of reactive hypoglycemia, diabetic polys, paresthesias or visual blurring.   Further, Patient has history of Vitamin D Deficiency with last vitamin D of  63 in Aug 2014.  Patient supplements vitamin D without any suspected side-effects.  Medication Sig  . allopurinol (ZYLOPRIM) 300 MG tablet Take 1 tablet (300 mg total) by mouth daily.  Marland Kitchen aspirin 81 MG tablet Take 81 mg by mouth daily.    Marland Kitchen atorvastatin (LIPITOR) 20 MG tablet Take 1 tablet (20 mg total) by mouth daily.  . Ziac 5/6.25 Take 1 tablet by mouth daily.  . Cholecalciferol (VITAMIN D) 1000 UNITS capsule Take 1,000 Units by mouth 4 (four) times daily.   . furosemide (LASIX) 20 MG tablet TAKE 1 TABLET  TWICE A DAY FOR BP AND FLUID  . glipiZIDE (GLUCOTROL) 10 MG tablet Take 10 mg by mouth daily before breakfast.  . Loratadine (CLARITIN PO) Take by mouth daily.    Marland Kitchen losartan (COZAAR) 100 MG tablet Take 1 tablet daily.  . magnesium oxide (MAG-OX) 400 MG tablet Take 400 mg by mouth daily.  .  metFORMIN (GLUCOPHAGE-XR) 500 MG 24 hr tablet Take 2 tablets (1,000 mg total) by mouth 2 (two) times daily.  . minoxidil (LONITEN) 10 MG tablet Take 1 tablet (10 mg total) by mouth daily.    No Known Allergies  PMHx:   Past Medical History  Diagnosis Date  . T2 NIDDM w/ Stage II CKD   . OSA (obstructive sleep apnea)   . Allergy   . Gout   . Hemochromatosis   . Dilated cardiomyopathy     non isch  . Hypertension   . Colon polyp     FHx:    Reviewed / unchanged  SHx:    Reviewed / unchanged   Systems Review: Constitutional: Denies fever, chills, wt changes, headaches, insomnia, fatigue, night sweats, change in appetite. Eyes: Denies redness, blurred vision, diplopia, discharge, itchy, watery eyes.  ENT: Denies discharge, congestion, post nasal drip, epistaxis, sore throat, earache, hearing loss, dental pain, tinnitus, vertigo, sinus pain, snoring.  CV: Denies chest pain, palpitations, irregular heartbeat, syncope, dyspnea, diaphoresis, orthopnea, PND, claudication, edema. Respiratory: denies cough, dyspnea, DOE, pleurisy, hoarseness, laryngitis, wheezing.  Gastrointestinal: Denies dysphagia, odynophagia, heartburn, reflux, water brash, abdominal pain or cramps, nausea, vomiting, bloating, diarrhea, constipation, hematemesis, melena, hematochezia,  or hemorrhoids. Genitourinary: Denies dysuria, frequency, urgency, nocturia, hesitancy, discharge, hematuria, flank pain. Musculoskeletal: Denies arthralgias, myalgias, stiffness, jt. swelling, pain, limp, strain/sprain.  Skin: Denies pruritus, rash, hives, warts,  acne, eczema, change in skin lesion(s). Neuro: No weakness, tremor, incoordination, spasms, paresthesia, or pain. Psychiatric: Denies confusion, memory loss, or sensory loss. Endo: Denies change in weight, skin, hair change.  Heme/Lymph: No excessive bleeding, bruising, orenlarged lymph nodes.   Exam:  BP 140/82  Pulse 72  Temp(Src) 97.6 F (36.4 C) (Temporal)  Resp 16   Ht 5' 4.75" (1.645 m)  Wt 190 lb 9.6 oz (86.456 kg)  BMI 31.95 kg/m2  Appears well nourished - in no distress. Eyes: PERRLA, EOMs, conjunctiva no swelling or erythema. Sinuses: No frontal/maxillary tenderness ENT/Mouth: EAC's clear, TM's nl w/o erythema, bulging. Nares clear w/o erythema, swelling, exudates. Oropharynx clear without erythema or exudates. Oral hygiene is good. Tongue normal, non obstructing. Hearing intact.  Neck: Supple. Thyroid nl. Car 2+/2+ without bruits, nodes or JVD. Chest: Respirations nl with BS clear & equal w/o rales, rhonchi, wheezing or stridor.  Cor: Heart sounds normal w/ regular rate and rhythm without sig. murmurs, gallops, clicks, or rubs. Peripheral pulses normal and equal  without edema.  Abdomen: Soft & bowel sounds normal. Non-tender w/o guarding, rebound, hernias, masses, or organomegaly.  Lymphatics: Unremarkable.  Musculoskeletal: Full ROM all peripheral extremities, joint stability, 5/5 strength, and normal gait.  Skin: Warm, dry without exposed rashes, lesions, ecchymosis apparent.  Neuro: Cranial nerves intact, reflexes equal bilaterally. Sensory-motor testing grossly intact. Tendon reflexes grossly intact.  Pysch: Alert & oriented x 3. Insight and judgement nl & appropriate. No ideations.  Assessment and Plan:  1. Hypertension - Continue monitor blood pressure at home. Continue diet/meds same.  2. Hyperlipidemia - Continue diet/meds, exercise,& lifestyle modifications. Continue monitor periodic cholesterol/liver & renal functions   3. T2 NIDDM w/ Stage II CKD - continue recommend prudent low glycemic diet, weight control, regular exercise, diabetic monitoring and periodic eye exams.  4. Vitamin D Deficiency - Continue supplementation.  5. Hereditary Hemochromatosis - F/u w/ Dr Benay Spice  Recommended regular exercise, BP monitoring, weight control, and discussed med and SE's. Recommended labs to assess and monitor clinical status. Further  disposition pending results of labs.

## 2014-01-22 ENCOUNTER — Other Ambulatory Visit: Payer: Self-pay | Admitting: *Deleted

## 2014-01-22 LAB — HEPATIC FUNCTION PANEL
ALBUMIN: 4.4 g/dL (ref 3.5–5.2)
ALK PHOS: 34 U/L — AB (ref 39–117)
ALT: 20 U/L (ref 0–53)
AST: 15 U/L (ref 0–37)
BILIRUBIN TOTAL: 0.8 mg/dL (ref 0.2–1.2)
Bilirubin, Direct: 0.2 mg/dL (ref 0.0–0.3)
Indirect Bilirubin: 0.6 mg/dL (ref 0.2–1.2)
Total Protein: 6.5 g/dL (ref 6.0–8.3)

## 2014-01-22 LAB — LIPID PANEL
Cholesterol: 124 mg/dL (ref 0–200)
HDL: 48 mg/dL (ref >39)
LDL CALC: 57 mg/dL (ref 0–99)
Total CHOL/HDL Ratio: 2.6 Ratio
Triglycerides: 93 mg/dL (ref <150)
VLDL: 19 mg/dL (ref 0–40)

## 2014-01-22 LAB — BASIC METABOLIC PANEL WITH GFR
BUN: 13 mg/dL (ref 6–23)
CO2: 29 meq/L (ref 19–32)
Calcium: 9.4 mg/dL (ref 8.4–10.5)
Chloride: 101 mEq/L (ref 96–112)
Creat: 0.86 mg/dL (ref 0.50–1.35)
GFR, Est African American: >89 mL/min
GFR, Est Non African American: >89 mL/min
Glucose, Bld: 229 mg/dL — ABNORMAL HIGH (ref 70–99)
POTASSIUM: 4 meq/L (ref 3.5–5.3)
SODIUM: 140 meq/L (ref 135–145)

## 2014-01-22 LAB — IRON AND TIBC
%SAT: 38 % (ref 20–55)
Iron: 132 ug/dL (ref 42–165)
TIBC: 346 ug/dL (ref 215–435)
UIBC: 214 ug/dL (ref 125–400)

## 2014-01-22 LAB — VITAMIN D 25 HYDROXY (VIT D DEFICIENCY, FRACTURES): Vit D, 25-Hydroxy: 41 ng/mL (ref 30–89)

## 2014-01-22 LAB — MAGNESIUM: Magnesium: 1.8 mg/dL (ref 1.5–2.5)

## 2014-01-22 LAB — TSH: TSH: 2.521 u[IU]/mL (ref 0.350–4.500)

## 2014-01-22 LAB — INSULIN, FASTING: INSULIN FASTING, SERUM: 13 u[IU]/mL (ref 3–28)

## 2014-04-23 ENCOUNTER — Ambulatory Visit (INDEPENDENT_AMBULATORY_CARE_PROVIDER_SITE_OTHER): Payer: Medicare Other | Admitting: Emergency Medicine

## 2014-04-23 ENCOUNTER — Encounter: Payer: Self-pay | Admitting: Emergency Medicine

## 2014-04-23 VITALS — BP 142/78 | HR 68 | Temp 98.1°F | Resp 16 | Wt 198.6 lb

## 2014-04-23 DIAGNOSIS — E782 Mixed hyperlipidemia: Secondary | ICD-10-CM | POA: Diagnosis not present

## 2014-04-23 DIAGNOSIS — E119 Type 2 diabetes mellitus without complications: Secondary | ICD-10-CM

## 2014-04-23 DIAGNOSIS — I10 Essential (primary) hypertension: Secondary | ICD-10-CM

## 2014-04-23 DIAGNOSIS — Z23 Encounter for immunization: Secondary | ICD-10-CM

## 2014-04-23 LAB — HEPATIC FUNCTION PANEL
ALK PHOS: 30 U/L — AB (ref 39–117)
ALT: 21 U/L (ref 0–53)
AST: 17 U/L (ref 0–37)
Albumin: 4.3 g/dL (ref 3.5–5.2)
BILIRUBIN DIRECT: 0.2 mg/dL (ref 0.0–0.3)
BILIRUBIN INDIRECT: 0.6 mg/dL (ref 0.2–1.2)
Total Bilirubin: 0.8 mg/dL (ref 0.2–1.2)
Total Protein: 6.4 g/dL (ref 6.0–8.3)

## 2014-04-23 LAB — CBC WITH DIFFERENTIAL/PLATELET
BASOS PCT: 0 % (ref 0–1)
Basophils Absolute: 0 10*3/uL (ref 0.0–0.1)
EOS ABS: 0.1 10*3/uL (ref 0.0–0.7)
EOS PCT: 3 % (ref 0–5)
HEMATOCRIT: 39 % (ref 39.0–52.0)
HEMOGLOBIN: 14.3 g/dL (ref 13.0–17.0)
Lymphocytes Relative: 25 % (ref 12–46)
Lymphs Abs: 1.2 10*3/uL (ref 0.7–4.0)
MCH: 31.8 pg (ref 26.0–34.0)
MCHC: 36.7 g/dL — ABNORMAL HIGH (ref 30.0–36.0)
MCV: 86.7 fL (ref 78.0–100.0)
MONO ABS: 0.5 10*3/uL (ref 0.1–1.0)
MONOS PCT: 10 % (ref 3–12)
Neutro Abs: 3 10*3/uL (ref 1.7–7.7)
Neutrophils Relative %: 62 % (ref 43–77)
Platelets: 188 10*3/uL (ref 150–400)
RBC: 4.5 MIL/uL (ref 4.22–5.81)
RDW: 13 % (ref 11.5–15.5)
WBC: 4.9 10*3/uL (ref 4.0–10.5)

## 2014-04-23 LAB — BASIC METABOLIC PANEL WITH GFR
BUN: 18 mg/dL (ref 6–23)
CALCIUM: 9.1 mg/dL (ref 8.4–10.5)
CO2: 25 mEq/L (ref 19–32)
Chloride: 106 mEq/L (ref 96–112)
Creat: 0.86 mg/dL (ref 0.50–1.35)
GFR, Est Non African American: >89 mL/min
GLUCOSE: 295 mg/dL — AB (ref 70–99)
Potassium: 3.9 mEq/L (ref 3.5–5.3)
SODIUM: 136 meq/L (ref 135–145)

## 2014-04-23 LAB — HEMOGLOBIN A1C
Hgb A1c MFr Bld: 7.5 % — ABNORMAL HIGH (ref <5.7)
Mean Plasma Glucose: 169 mg/dL — ABNORMAL HIGH (ref <117)

## 2014-04-23 LAB — LIPID PANEL
CHOL/HDL RATIO: 2.2 ratio
Cholesterol: 106 mg/dL (ref 0–200)
HDL: 49 mg/dL (ref >39)
LDL Cholesterol: 46 mg/dL (ref 0–99)
Triglycerides: 57 mg/dL (ref <150)
VLDL: 11 mg/dL (ref 0–40)

## 2014-04-23 NOTE — Progress Notes (Signed)
Subjective:    Patient ID: Brett [name], male    DOB: 12-Oct-1946, 68 y.o.   MRN: [id number]  HPI Comments: 68 yo WM presents for 3 month F/U for HTN, Cholesterol, Dm, D. Deficient. He is not eating as healthy. He is keeping busy and walks for exercise. 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 BS is up and down. He checks feet routinely and denies skin break down or neuropathy increase. He notes BP is good.   He wants to get his shingles vaccine today.  WBC             6.3   01/21/2014 HGB            14.5   01/21/2014 HCT            39.8   01/21/2014 PLT             188   01/21/2014 GLUCOSE         229   01/21/2014 CHOL            124   01/21/2014 TRIG             93   01/21/2014 HDL              48   01/21/2014 LDLCALC          57   01/21/2014 ALT              20   01/21/2014 AST              15   01/21/2014 NA              140   01/21/2014 K               4.0   01/21/2014 CL              101   01/21/2014 CREATININE     0.86   01/21/2014 BUN              13   01/21/2014 CO2              29   01/21/2014 TSH           2.521   01/21/2014 PSA            0.49   09/11/2013 HGBA1C          7.4   01/21/2014 MICROALBUR     0.50   09/11/2013      Medication List       This list is accurate as of: 04/23/14  8:47 AM.  Always use your most recent med list.               allopurinol 300 MG tablet  Commonly known as:  ZYLOPRIM  Take 1 tablet (300 mg total) by mouth daily.     aspirin 81 MG tablet  Take 81 mg by mouth daily.     atorvastatin 20 MG tablet  Commonly known as:  LIPITOR  Take 1 tablet (20 mg total) by mouth daily.     bisoprolol-hydrochlorothiazide 5-6.25 MG per tablet  Commonly known as:  ZIAC  Take 1 tablet by mouth daily.     CLARITIN PO  Take by mouth daily.     furosemide 20 MG tablet  Commonly known as:  LASIX  TAKE 1 TABLET BY MOUTH TWICE A DAY FOR BLOOD PRESSURE AND FLUID     glipiZIDE 10 MG tablet  Commonly known as:  GLUCOTROL  Take 10 mg by mouth daily before  breakfast.     losartan 100 MG tablet  Commonly known as:  COZAAR  Take 1 tablet (100 mg total) by mouth daily.     magnesium oxide 400 MG tablet  Commonly known as:  MAG-OX  Take 400 mg by mouth daily.     metFORMIN 500 MG 24 hr tablet  Commonly known as:  GLUCOPHAGE-XR  Take 2 tablets (1,000 mg total) by mouth 2 (two) times daily.     minoxidil 10 MG tablet  Commonly known as:  LONITEN  Take 1 tablet (10 mg total) by mouth daily.     Vitamin D 1000 UNITS capsule  Take 1,000 Units by mouth 4 (four) times daily.       No Known Allergies  Past Medical History  Diagnosis Date  . Diabetes mellitus without complication   . OSA (obstructive sleep apnea)   . Allergy   . Gout   . Hemochromatosis   . Dilated cardiomyopathy     non isch  . Hypertension   . Colon polyp      Review of Systems  All other systems reviewed and are negative.  BP 142/78  Pulse 68  Temp(Src) 98.1 F (36.7 C)  Resp 16  Wt 198 lb 9.6 oz (90.084 kg)     Objective:   Physical Exam  Nursing note and vitals reviewed. Constitutional: He is oriented to person, place, and time. He appears well-developed and well-nourished.  HENT:  Head: Normocephalic and atraumatic.  Right Ear: External ear normal.  Left Ear: External ear normal.  Nose: Nose normal.  Eyes: Conjunctivae and EOM are normal.  Neck: Normal range of motion. Neck supple. No JVD present. No thyromegaly present.  Cardiovascular: Normal rate, regular rhythm, normal heart sounds and intact distal pulses.   Pulmonary/Chest: Effort normal and breath sounds normal.  Abdominal: Soft. Bowel sounds are normal. He exhibits no distension. There is no tenderness.  Musculoskeletal: Normal range of motion. He exhibits no edema and no tenderness.  Lymphadenopathy:    He has no cervical adenopathy.  Neurological: He is alert and oriented to person, place, and time. No cranial nerve deficit. Coordination normal.  Skin: Skin is warm and dry.   Psychiatric: He has a normal mood and affect. His behavior is normal. Judgment and thought content normal.          Assessment & Plan:  1.  3 month F/U for HTN, Cholesterol,DM, D. Deficient. Needs healthy diet, cardio QD and obtain healthy weight. Check Labs, Check BP if >130/80 call office, Check BS if >200 call office   2. Zostavax- Declines at 95$ cost, advised check with insurance to see if better coverage at Pharmacy

## 2014-04-24 LAB — INSULIN, FASTING: Insulin fasting, serum: 27 u[IU]/mL (ref 3–28)

## 2014-07-28 ENCOUNTER — Ambulatory Visit (HOSPITAL_BASED_OUTPATIENT_CLINIC_OR_DEPARTMENT_OTHER): Payer: Medicare Other | Admitting: Oncology

## 2014-07-28 ENCOUNTER — Other Ambulatory Visit (HOSPITAL_BASED_OUTPATIENT_CLINIC_OR_DEPARTMENT_OTHER): Payer: Medicare Other

## 2014-07-28 DIAGNOSIS — E118 Type 2 diabetes mellitus with unspecified complications: Secondary | ICD-10-CM

## 2014-07-28 DIAGNOSIS — Z23 Encounter for immunization: Secondary | ICD-10-CM | POA: Diagnosis not present

## 2014-07-28 DIAGNOSIS — Z8489 Family history of other specified conditions: Secondary | ICD-10-CM

## 2014-07-28 DIAGNOSIS — I10 Essential (primary) hypertension: Secondary | ICD-10-CM | POA: Diagnosis not present

## 2014-07-28 LAB — FERRITIN CHCC: Ferritin: 60 ng/ml (ref 22–316)

## 2014-07-28 MED ORDER — INFLUENZA VAC SPLIT QUAD 0.5 ML IM SUSY
0.5000 mL | PREFILLED_SYRINGE | Freq: Once | INTRAMUSCULAR | Status: AC
Start: 2014-07-28 — End: 2014-07-28
  Administered 2014-07-28: 0.5 mL via INTRAMUSCULAR
  Filled 2014-07-28: qty 0.5

## 2014-07-28 NOTE — Progress Notes (Signed)
  [place] OFFICE PROGRESS NOTE   Diagnosis: Hemochromatosis  INTERVAL HISTORY:   He returns as scheduled. He feels well. No specific complaint. He last underwent phlebotomy in November of 2014.  Objective:  Vital signs in last 24 hours:  Blood pressure 170/79, pulse 73, temperature 99.1 F (37.3 C), temperature source Oral, resp. rate 18, height 5' 4.75" (1.645 m), weight 200 lb 8 oz (90.946 kg).   Resp: Lungs clear bilaterally Cardio: Distant heart sounds, regular rate and rhythm GI: No hepatosplenomegaly Vascular: No leg edema   Lab Results:  Lab Results  Component Value Date   WBC 4.9 04/23/2014   HGB 14.3 04/23/2014   HCT 39.0 04/23/2014   MCV 86.7 04/23/2014   PLT 188 04/23/2014   NEUTROABS 3.0 04/23/2014   Ferritin 31 on 11/26/2013   Imaging:  No results found.  Medications: I have reviewed the patient's current medications.  Assessment/Plan: 1. 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 He was last treated with phlebotomy therapy in November of 2014 . 2. History of tobacco use in the remote past. 3. Hypertension. 4. Hypercholesterolemia. 5. Diabetes. 6. Gout. 7. Sleep apnea. 8. Family history of hemochromatosis.   Disposition:  He appears stable. We will followup on the ferritin level from today. The plan is to resume phlebotomy therapy for a ferritin of greater than 100. He will return for a ferritin level in 4 months. Brett [name] received an influenza vaccine today. He will return for an office visit in one year.  Betsy Coder, MD  07/28/2014  9:13 AM

## 2014-07-29 ENCOUNTER — Telehealth: Payer: Self-pay | Admitting: *Deleted

## 2014-07-29 ENCOUNTER — Telehealth: Payer: Self-pay | Admitting: Oncology

## 2014-07-29 NOTE — Telephone Encounter (Signed)
Lft msg for pt labs/ov per 10/05 POF, mailed sch .Marland Kitchen... KJ

## 2014-07-29 NOTE — Telephone Encounter (Signed)
Message copied by Brien Few on Tue Jul 29, 2014  9:46 AM ------      Message from: [name]      Created: Mon Jul 28, 2014  6:07 PM       Please call patient, ferritin is ok, f/u as scheduled ------

## 2014-07-29 NOTE — Telephone Encounter (Signed)
Called pt with ferritin results. No phlebotomy needed. Pt understands to follow up as scheduled.

## 2014-08-08 ENCOUNTER — Other Ambulatory Visit: Payer: Self-pay

## 2014-08-25 ENCOUNTER — Other Ambulatory Visit: Payer: Self-pay | Admitting: Physician Assistant

## 2014-08-26 ENCOUNTER — Other Ambulatory Visit: Payer: Self-pay

## 2014-08-26 MED ORDER — MINOXIDIL 10 MG PO TABS: 10.0000 mg | ORAL_TABLET | Freq: Every day | ORAL | Status: DC

## 2014-09-02 ENCOUNTER — Other Ambulatory Visit: Payer: Self-pay | Admitting: *Deleted

## 2014-09-02 MED ORDER — GLIPIZIDE 10 MG PO TABS: 10.0000 mg | ORAL_TABLET | Freq: Every day | ORAL | Status: DC

## 2014-09-11 ENCOUNTER — Ambulatory Visit
Admission: RE | Admit: 2014-09-11 | Discharge: 2014-09-11 | Disposition: A | Discharge status: Home / Self Care | Payer: Medicare Other | Source: Ambulatory Visit | Attending: Emergency Medicine | Admitting: Emergency Medicine

## 2014-09-11 ENCOUNTER — Encounter: Payer: Self-pay | Admitting: Emergency Medicine

## 2014-09-11 ENCOUNTER — Other Ambulatory Visit: Payer: Self-pay | Admitting: Emergency Medicine

## 2014-09-11 ENCOUNTER — Ambulatory Visit (INDEPENDENT_AMBULATORY_CARE_PROVIDER_SITE_OTHER): Payer: Medicare Other | Admitting: Emergency Medicine

## 2014-09-11 VITALS — BP 118/64 | HR 64 | Temp 98.6°F | Resp 18 | Wt 198.0 lb

## 2014-09-11 DIAGNOSIS — K219 Gastro-esophageal reflux disease without esophagitis: Secondary | ICD-10-CM

## 2014-09-11 DIAGNOSIS — E782 Mixed hyperlipidemia: Secondary | ICD-10-CM

## 2014-09-11 DIAGNOSIS — R05 Cough: Secondary | ICD-10-CM

## 2014-09-11 DIAGNOSIS — Z125 Encounter for screening for malignant neoplasm of prostate: Secondary | ICD-10-CM

## 2014-09-11 DIAGNOSIS — IMO0001 Reserved for inherently not codable concepts without codable children: Secondary | ICD-10-CM

## 2014-09-11 DIAGNOSIS — R059 Cough, unspecified: Secondary | ICD-10-CM

## 2014-09-11 DIAGNOSIS — I10 Essential (primary) hypertension: Secondary | ICD-10-CM | POA: Diagnosis not present

## 2014-09-11 DIAGNOSIS — R350 Frequency of micturition: Secondary | ICD-10-CM | POA: Diagnosis not present

## 2014-09-11 DIAGNOSIS — Z0000 Encounter for general adult medical examination without abnormal findings: Secondary | ICD-10-CM

## 2014-09-11 DIAGNOSIS — R6889 Other general symptoms and signs: Secondary | ICD-10-CM | POA: Diagnosis not present

## 2014-09-11 DIAGNOSIS — Z23 Encounter for immunization: Secondary | ICD-10-CM

## 2014-09-11 DIAGNOSIS — R5381 Other malaise: Secondary | ICD-10-CM

## 2014-09-11 DIAGNOSIS — N400 Enlarged prostate without lower urinary tract symptoms: Secondary | ICD-10-CM

## 2014-09-11 DIAGNOSIS — Z1212 Encounter for screening for malignant neoplasm of rectum: Secondary | ICD-10-CM

## 2014-09-11 DIAGNOSIS — Z0001 Encounter for general adult medical examination with abnormal findings: Secondary | ICD-10-CM

## 2014-09-11 DIAGNOSIS — E559 Vitamin D deficiency, unspecified: Secondary | ICD-10-CM | POA: Diagnosis not present

## 2014-09-11 DIAGNOSIS — E119 Type 2 diabetes mellitus without complications: Secondary | ICD-10-CM | POA: Diagnosis not present

## 2014-09-11 DIAGNOSIS — Z1331 Encounter for screening for depression: Secondary | ICD-10-CM

## 2014-09-11 LAB — CBC WITH DIFFERENTIAL/PLATELET
BASOS ABS: 0 10*3/uL (ref 0.0–0.1)
Basophils Relative: 0 % (ref 0–1)
EOS ABS: 0.1 10*3/uL (ref 0.0–0.7)
Eosinophils Relative: 2 % (ref 0–5)
HCT: 40.7 % (ref 39.0–52.0)
Hemoglobin: 14.7 g/dL (ref 13.0–17.0)
Lymphocytes Relative: 27 % (ref 12–46)
Lymphs Abs: 1.4 10*3/uL (ref 0.7–4.0)
MCH: 32 pg (ref 26.0–34.0)
MCHC: 36.1 g/dL — AB (ref 30.0–36.0)
MCV: 88.7 fL (ref 78.0–100.0)
MPV: 8.9 fL — AB (ref 9.4–12.4)
Monocytes Absolute: 0.4 10*3/uL (ref 0.1–1.0)
Monocytes Relative: 8 % (ref 3–12)
NEUTROS ABS: 3.3 10*3/uL (ref 1.7–7.7)
Neutrophils Relative %: 63 % (ref 43–77)
PLATELETS: 184 10*3/uL (ref 150–400)
RBC: 4.59 MIL/uL (ref 4.22–5.81)
RDW: 13.1 % (ref 11.5–15.5)
WBC: 5.3 10*3/uL (ref 4.0–10.5)

## 2014-09-11 LAB — HEMOGLOBIN A1C
HEMOGLOBIN A1C: 7.9 % — AB (ref <5.7)
MEAN PLASMA GLUCOSE: 180 mg/dL — AB (ref <117)

## 2014-09-11 IMAGING — CR DG CHEST 2V
2 series · 2 of 2 positions shown · non-contrast
Comparison: [DATE]

CLINICAL DATA: Cough for 6 months, malaise

EXAM:
CHEST  2 VIEW

[w chest pa]
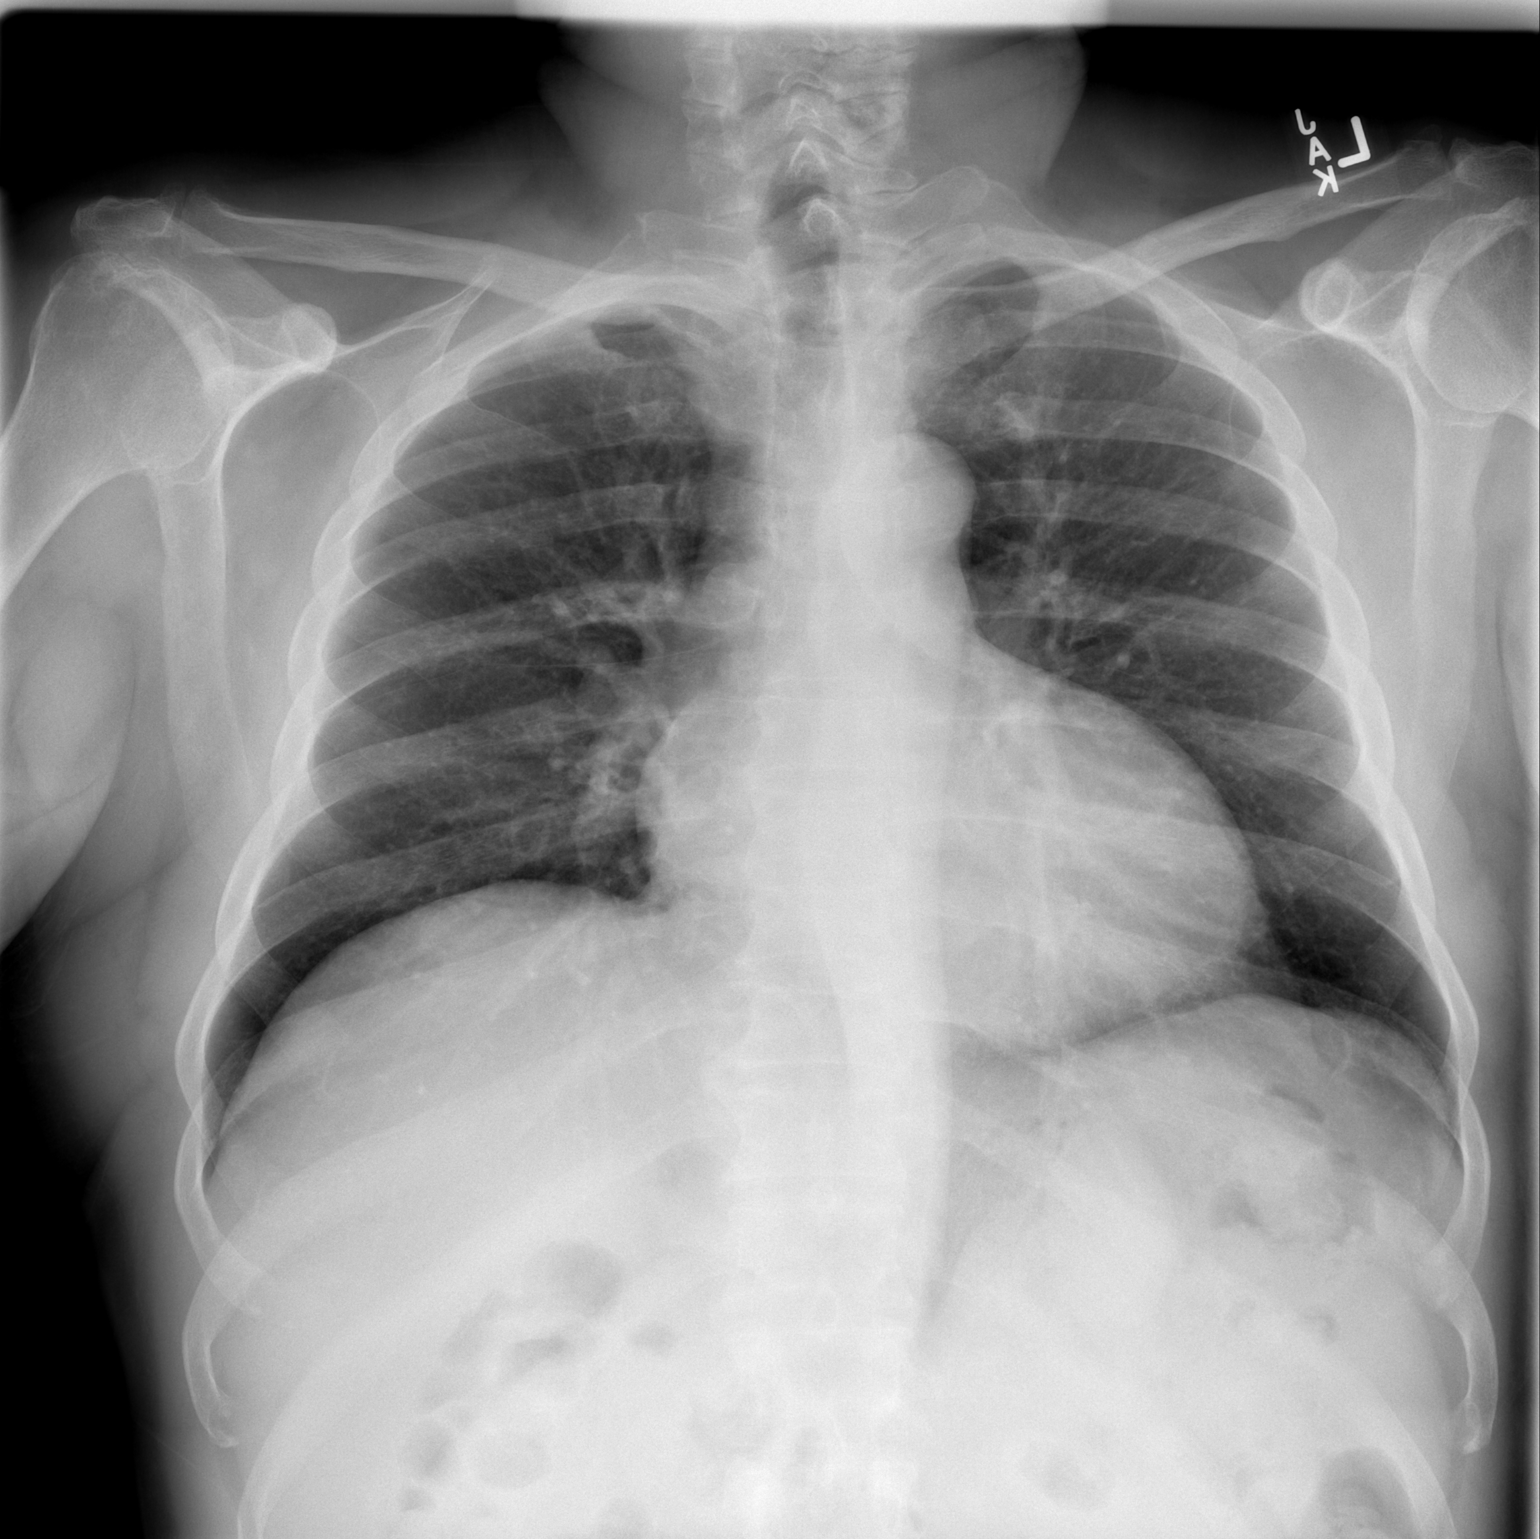

[w chest lat]
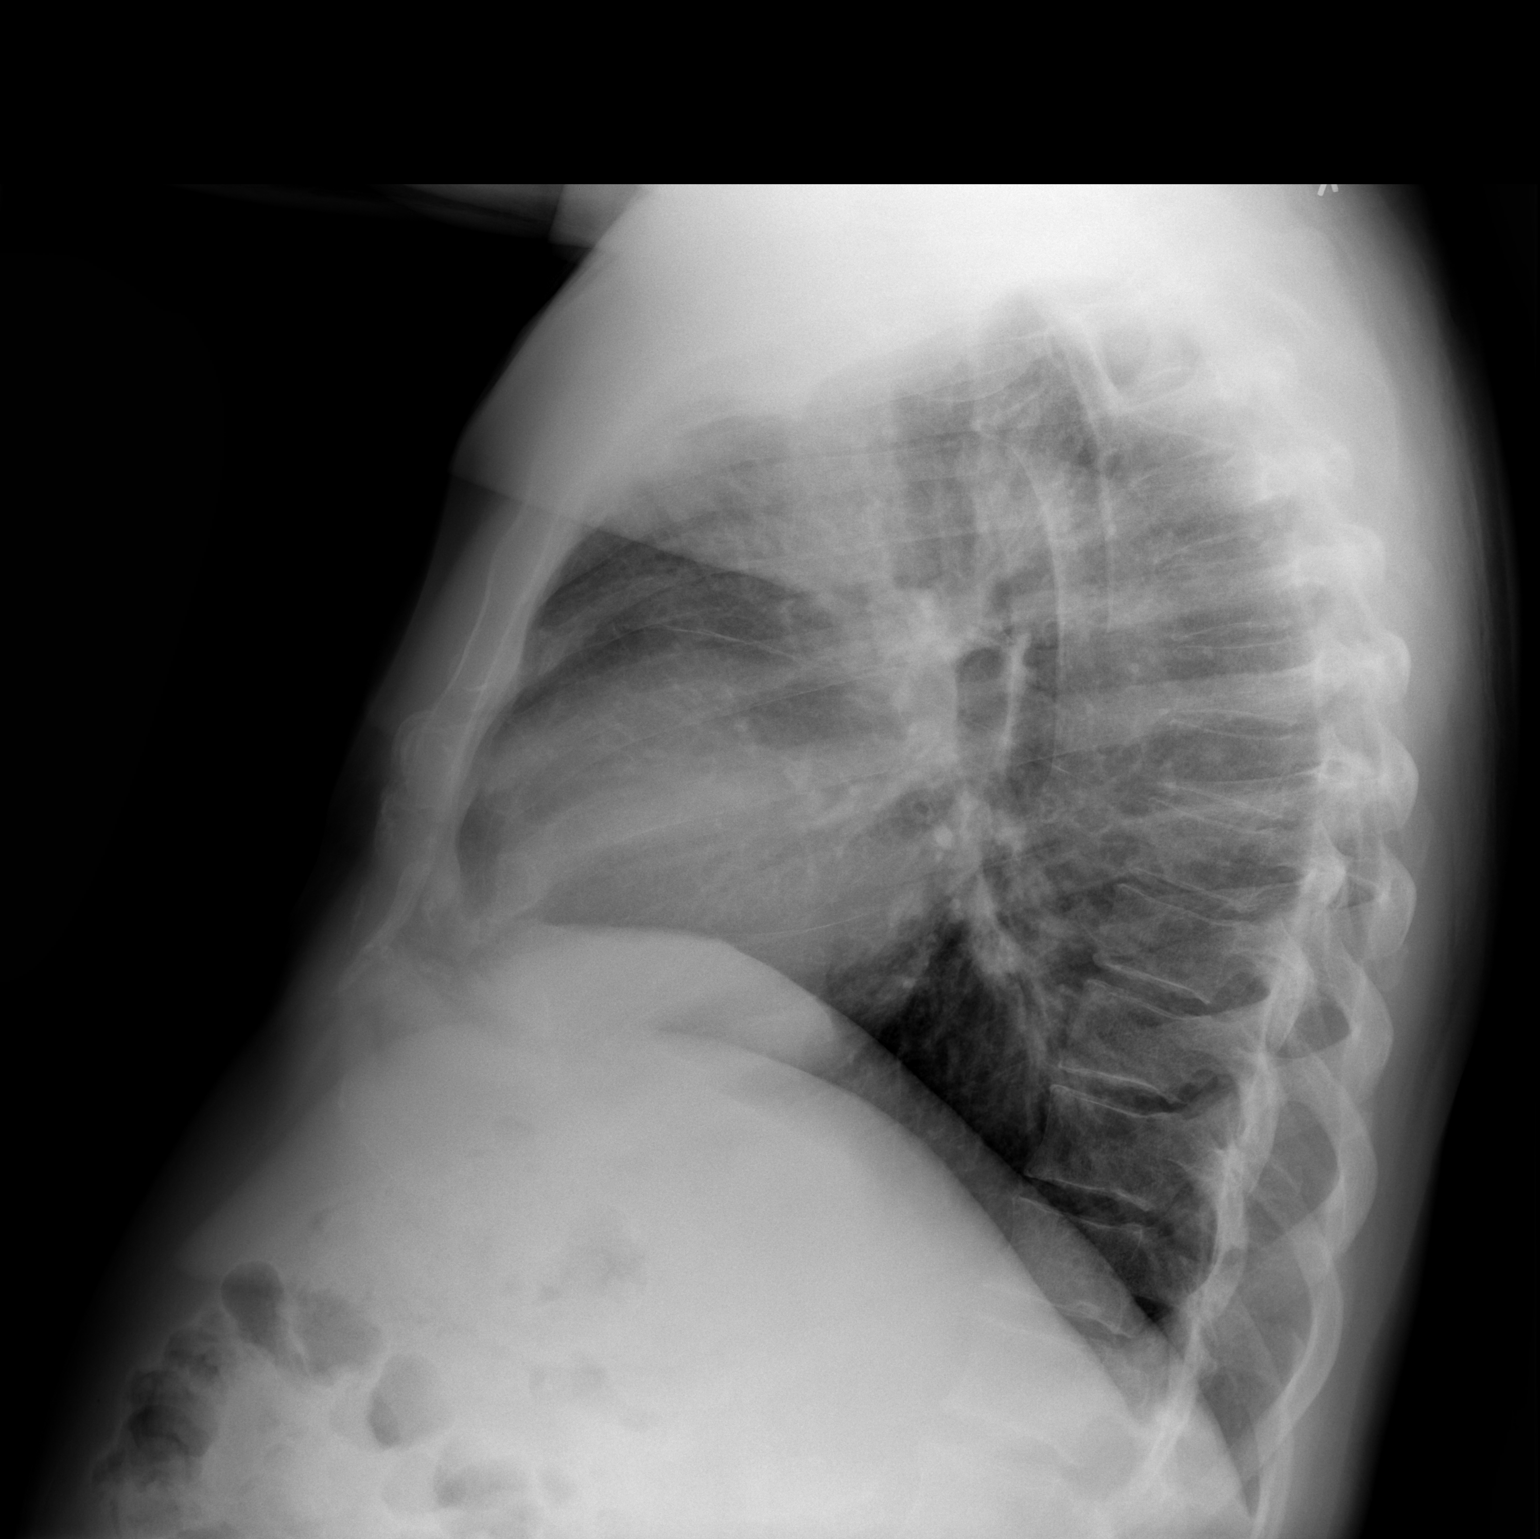

[2 of 2 positions shown; findings below may reference images not displayed]

FINDINGS: Cardiomediastinal silhouette is stable. No acute infiltrate or
pleural effusion. No pulmonary edema. Bony thorax is unremarkable.
IMPRESSION: No active cardiopulmonary disease.

## 2014-09-11 MED ORDER — RANITIDINE HCL 300 MG PO TABS: 300.0000 mg | ORAL_TABLET | Freq: Every day | ORAL | Status: DC

## 2014-09-11 NOTE — Progress Notes (Signed)
Patient ID: Brett [name], male   DOB: 07/16/1946, 68 y.o.   MRN: [id number]  [facility] VISIT AND CPE  Assessment:  1. Medicare wellness update/CPE- Update screening labs/ History/ Immunizations/ Testing as needed. Advised healthy diet, QD exercise, increase H20 and continue RX/ Vitamins AD.  2. 3 month F/U for HTN, Cholesterol,DM, D. Deficient. Needs healthy diet, cardio QD and obtain healthy weight. Check Labs, Check BP if >130/80 call office, Check BS if >200 call office   3. GERD vs Metformin SE- Diet/ hygiene discussed, May try OTC Nexium and call with results. ELEVATE head of bed with books, if no improvement with restarting Ranitidine, please call us so we can refer you for EGD with GI  4. Frequency with BPH HX- Check labs, increase H20, bland diet, continue RX AD    Plan:   SEE ABOVE  During the course of the visit the patient was educated and counseled about appropriate screening and preventive services including:    Pneumococcal vaccine   Influenza vaccine  Td vaccine  Screening electrocardiogram  Bone densitometry screening  Colorectal cancer screening  Diabetes screening  Glaucoma screening  Nutrition counseling   Advanced directives: requested  Screening recommendations, referrals: Vaccinations: Please see documentation below and orders this visit.  Nutrition assessed and recommended  Colonoscopy not indicated Recommended yearly ophthalmology/optometry visit for glaucoma screening and checkup Recommended yearly dental visit for hygiene and checkup Advanced directives - requested  Conditions/risks identified: BMI: Discussed weight loss, diet, and increase physical activity.  Increase physical activity: AHA recommends 150 minutes of physical activity a week.  Medications reviewed Diabetes is at goal, ACE/ARB therapy: Yes.  Urinary Incontinence is not an issue: discussed non pharmacology and pharmacology options.  Fall risk: low-  discussed PT, home fall assessment, medications.    Subjective:  Brett [name] is a 68 y.o. male who presents for Medicare Annual Wellness Visit and complete physical.  Date of last medicare wellness visit is unknown.  He notes increased reflux with starting Metformin but also had stopped Ranitidine.   He has had elevated blood pressure for several years. His blood pressure has been controlled at home, today their BP is BP: 118/64 mmHg He does workout. He denies chest pain, shortness of breath, dizziness. 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 He is on cholesterol medication and denies myalgias. His cholesterol is at goal. The cholesterol last visit was:   Lab Results  Component Value Date   CHOL 99 09/11/2014   HDL 44 09/11/2014   LDLCALC 42 09/11/2014   TRIG 63 09/11/2014   CHOLHDL 2.3 09/11/2014   He has had diabetes for several years. He has been working on diet and exercise for diabetes, and denies foot ulcerations, nausea and visual disturbances. Last A1C in the office was:  Lab Results  Component Value Date   HGBA1C 7.9* 09/11/2014   Patient is on Vitamin D supplement.   Lab Results  Component Value Date   VD25OH 71 09/11/2014       Names of Other Physician/Practitioners you currently use: Patient Care Team: [name], MD as PCP - General (Internal Medicine) [name], MD as Consulting Physician (Oncology) Lelon Perla, MD as Consulting Physician (Cardiology)   Medication Review: Current Outpatient Prescriptions on File Prior to Visit  Medication Sig Dispense Refill  . allopurinol (ZYLOPRIM) 300 MG tablet Take 1 tablet by mouth  daily 90 tablet 1  . aspirin 81  MG tablet Take 81 mg by mouth daily.      Marland Kitchen atorvastatin (LIPITOR) 20 MG tablet Take 1 tablet by mouth  daily 90 tablet 1  . bisoprolol-hydrochlorothiazide (ZIAC) 5-6.25 MG per tablet Take 1 tablet by mouth daily. 90 tablet 3  .  Cholecalciferol (VITAMIN D) 1000 UNITS capsule Take 5,000 Units by mouth daily.     . furosemide (LASIX) 20 MG tablet Take 1 tablet by mouth  every day for blood  pressure and fluid 90 tablet 1  . glipiZIDE (GLUCOTROL) 10 MG tablet Take 1 tablet (10 mg total) by mouth daily before breakfast. 90 tablet 1  . Loratadine (CLARITIN PO) Take by mouth daily.      Marland Kitchen losartan (COZAAR) 100 MG tablet Take 1 tablet (100 mg total) by mouth daily. 90 tablet 3  . magnesium oxide (MAG-OX) 400 MG tablet Take 400 mg by mouth daily.    . metFORMIN (GLUCOPHAGE-XR) 500 MG 24 hr tablet Take 2 tablets (1,000 mg total) by mouth 2 (two) times daily. 360 tablet 3  . minoxidil (LONITEN) 10 MG tablet Take 1 tablet (10 mg total) by mouth daily. 90 tablet 1   No current facility-administered medications on file prior to visit.   No Known Allergies  Past Medical History  Diagnosis Date  . Diabetes mellitus without complication   . OSA (obstructive sleep apnea)   . Allergy   . Gout   . Hemochromatosis   . Dilated cardiomyopathy     non isch  . Hypertension   . Colon polyp    Past Surgical History  Procedure Laterality Date  . Eye surgery Bilateral     Cataract   History  Substance Use Topics  . Smoking status: Former Smoker    Quit date: 11/24/1966  . Smokeless tobacco: Never Used  . Alcohol Use: Yes     Comment: Occasionally   Family History  Problem Relation Age of Onset  . Diabetes Father   . Diabetes Other   . Hypertension Other   . Hyperlipidemia Other   . Heart disease Other   . Cancer Maternal Uncle     brain    Current Problems (verified) Patient Active Problem List   Diagnosis Date Noted  . Hyperlipidemia 01/21/2014  . Vitamin D Deficiency 01/21/2014  . Encounter for long-term (current) use of other medications 01/21/2014  . T2 NIDDM w/Stage II CKD (GFR 86 ml/min)   . Hypertension   . OSA (obstructive sleep apnea)   . Allergy   . Gout   . Dilated cardiomyopathy   .  Hemochromatosis 09/27/2011    Screening Tests Health Maintenance  Topic Date Due  . TETANUS/TDAP  07/22/1965  . ZOSTAVAX  07/22/2006  . OPHTHALMOLOGY EXAM  10/11/2014  . COLONOSCOPY  10/24/2014  . HEMOGLOBIN A1C  03/12/2015  . INFLUENZA VACCINE  05/25/2015  . FOOT EXAM  09/12/2015  . URINE MICROALBUMIN  09/12/2015  . PNEUMOCOCCAL POLYSACCHARIDE VACCINE AGE 59 AND OVER  Completed    Immunization History  Administered Date(s) Administered  . DT 01/21/2014  . Influenza Split 09/11/2013  . Influenza,inj,Quad PF,36+ Mos 07/28/2014  . Influenza-Unspecified 08/21/2012  . Pneumococcal Conjugate-13 09/11/2014  . Pneumococcal-Unspecified 08/21/2012    Preventative care: Last colonoscopy: 2006 ([place]) EYE: 10/11/2013 Dentist: PRN Hematology: Q 6-12 months  Prior vaccinations: TD:2015  Influenza: 2015 Pneumovax: 2013 Shingles/Zostavax: declines due to cost  History reviewed: allergies, current medications, past family history, past medical history, past social history, past surgical history and problem  list   Risk Factors: Tobacco History  Substance Use Topics  . Smoking status: Former Smoker    Quit date: 11/24/1966  . Smokeless tobacco: Never Used  . Alcohol Use: Yes     Comment: Occasionally   He does not smoke.  Patient is a former smoker. Are there smokers in your home (other than you)?  No  Alcohol Current alcohol use: none  Caffeine Current caffeine use: coffee 1 /day  Exercise Current exercise: housecleaning, walking and yard work  Nutrition/Diet Current diet: in general, a "healthy" diet    Cardiac risk factors: advanced age (older than 72 for men, 55 for women), diabetes mellitus, dyslipidemia and male gender.  Depression Screen (Note: if answer to either of the following is "Yes", a more complete depression screening is indicated)   Q1: Over the past two weeks, have you felt down, depressed or hopeless? No  Q2: Over the past two weeks, have  you felt little interest or pleasure in doing things? No  Have you lost interest or pleasure in daily life? No  Do you often feel hopeless? No  Do you cry easily over simple problems? No  Activities of Daily Living In your present state of health, do you have any difficulty performing the following activities?:  Driving? No Managing money?  No Feeding yourself? No Getting from bed to chair? No Climbing a flight of stairs? No Preparing food and eating?: No Bathing or showering? No Getting dressed: No Getting to the toilet? No Using the toilet:No Moving around from place to place: No In the past year have you fallen or had a near fall?:No   Are you sexually active?  No  Do you have more than one partner?  No  Vision Difficulties: No  Hearing Difficulties: No Do you often ask people to speak up or repeat themselves? No Do you experience ringing or noises in your ears? No Do you have difficulty understanding soft or whispered voices? No  Cognition  Do you feel that you have a problem with memory?No  Do you often misplace items? No  Do you feel safe at home?  Yes  Advanced directives Does patient have a Jarratt? Yes, Daughter ROSIE Does patient have a Living Will? Yes      Objective:     Blood pressure 118/64, pulse 64, temperature 98.6 F (37 C), temperature source Temporal, resp. rate 18, weight 198 lb (89.812 kg). Body mass index is 33.19 kg/(m^2).  General appearance: alert, no distress, WD/WN, male Cognitive Testing  Alert? Yes  Normal Appearance?Yes  Oriented to person? Yes  Place? Yes   Time? Yes  Recall of three objects?  Yes  Can perform simple calculations? Yes  Displays appropriate judgment?Yes  Can read the correct time from a watch face?Yes  HEENT: normocephalic, sclerae anicteric, TMs pearly, nares patent, no discharge or erythema, pharynx normal Oral cavity: MMM, no lesions Neck: supple, no lymphadenopathy, no thyromegaly,  no masses Heart: RRR, normal S1, S2, no murmurs Lungs: CTA bilaterally, no wheezes, rhonchi, or rales Abdomen: +bs, soft, non tender, non distended, no masses, no hepatomegaly, no splenomegaly Musculoskeletal: nontender, no swelling, no obvious deformity Skin:WNL EXPOSED AREA, Mild scaling at base of heels Extremities: no edema, no cyanosis, no clubbing Pulses: 2+ symmetric, upper and lower extremities, normal cap refill Neurological: alert, oriented x 3, CN2-12 intact, strength normal upper extremities and lower extremities, sensation normal throughout, DTRs 2+ throughout, no cerebellar signs, gait normal Psychiatric: normal affect, behavior  normal, pleasant  Rectal: WNL, NEG [place]  AORTA SCAN WNL EKG NSCSPT  Medicare Attestation I have personally reviewed: The patient's medical and social history Their use of alcohol, tobacco or illicit drugs Their current medications and supplements The patient's functional ability including ADLs,fall risks, home safety risks, cognitive, and hearing and visual impairment Diet and physical activities Evidence for depression or mood disorders  The patient's weight, height, BMI, and visual acuity have been recorded in the chart.  I have made referrals, counseling, and provided education to the patient based on review of the above and I have provided the patient with a written personalized care plan for preventive services.     Kelby Aline, R, PA-C   09/14/2014

## 2014-09-11 NOTE — Patient Instructions (Addendum)
Bad carbs also include fruit juice, alcohol, and sweet tea. These are empty calories that do not signal to your brain that you are full.   Please remember the good carbs are still carbs which convert into sugar. So please measure them out no more than 1/2-1 cup of rice, oatmeal, pasta, and beans  Veggies are however free foods! Pile them on.   Not all fruit is created equal. Please see the list below, the fruit at the bottom is higher in sugars than the fruit at the top. Please avoid all dried fruits.      Pneumococcal Vaccine, Polyvalent solution for injection What is this medicine? PNEUMOCOCCAL VACCINE, POLYVALENT (NEU mo KOK al vak SEEN, pol ee VEY luhnt) is a vaccine to prevent pneumococcus bacteria infection. These bacteria are a major cause of ear infections, Strep throat infections, and serious pneumonia, meningitis, or blood infections worldwide. These vaccines help the body to produce antibodies (protective substances) that help your body defend against these bacteria. This vaccine is recommended for people 17 years of age and older with health problems. It is also recommended for all adults over 32 years old. This vaccine will not treat an infection. This medicine may be used for other purposes; ask your health care provider or pharmacist if you have questions. COMMON BRAND NAME(S): Pneumovax 23 What should I tell my health care provider before I take this medicine? They need to know if you have any of these conditions: -bleeding problems -bone marrow or organ transplant -cancer, Hodgkin's disease -fever -infection -immune system problems -low platelet count in the blood -seizures -an unusual or allergic reaction to pneumococcal vaccine, diphtheria toxoid, other vaccines, latex, other medicines, foods, dyes, or preservatives -pregnant or trying to get pregnant -breast-feeding How should I use this medicine? This vaccine is for injection into a muscle or under the skin. It  is given by a health care professional. A copy of Vaccine Information Statements will be given before each vaccination. Read this sheet carefully each time. The sheet may change frequently. Talk to your pediatrician regarding the use of this medicine in children. While this drug may be prescribed for children as young as 71 years of age for selected conditions, precautions do apply. Overdosage: If you think you have taken too much of this medicine contact a poison control center or emergency room at once. NOTE: This medicine is only for you. Do not share this medicine with others. What if I miss a dose? It is important not to miss your dose. Call your doctor or health care professional if you are unable to keep an appointment. What may interact with this medicine? -medicines for cancer chemotherapy -medicines that suppress your immune function -medicines that treat or prevent blood clots like warfarin, enoxaparin, and dalteparin -steroid medicines like prednisone or cortisone This list may not describe all possible interactions. Give your health care provider a list of all the medicines, herbs, non-prescription drugs, or dietary supplements you use. Also tell them if you smoke, drink alcohol, or use illegal drugs. Some items may interact with your medicine. What should I watch for while using this medicine? Mild fever and pain should go away in 3 days or less. Report any unusual symptoms to your doctor or health care professional. What side effects may I notice from receiving this medicine? Side effects that you should report to your doctor or health care professional as soon as possible: -allergic reactions like skin rash, itching or hives, swelling of the  face, lips, or tongue -breathing problems -confused -fever over 102 degrees F -pain, tingling, numbness in the hands or feet -seizures -unusual bleeding or bruising -unusual muscle weakness Side effects that usually do not require medical  attention (report to your doctor or health care professional if they continue or are bothersome): -aches and pains -diarrhea -fever of 102 degrees F or less -headache -irritable -loss of appetite -pain, tender at site where injected -trouble sleeping This list may not describe all possible side effects. Call your doctor for medical advice about side effects. You may report side effects to FDA at 1-800-FDA-1088. Where should I keep my medicine? This does not apply. This vaccine is given in a clinic, pharmacy, doctor's office, or other health care setting and will not be stored at home. NOTE: This sheet is a summary. It may not cover all possible information. If you have questions about this medicine, talk to your doctor, pharmacist, or health care provider.  2015, Elsevier/Gold Standard. (2008-05-16 14:32:37)  FYI ACID information. ELEVATE ead of bed with books, if no improvement with restarting Ranitidine, please call us so we can refer you for EGD (SCOPE OF ESOPHAGUS) Food Choices for Gastroesophageal Reflux Disease When you have gastroesophageal reflux disease (GERD), the foods you eat and your eating habits are very important. Choosing the right foods can help ease the discomfort of GERD. WHAT GENERAL GUIDELINES DO I NEED TO FOLLOW?  Choose fruits, vegetables, whole grains, low-fat dairy products, and low-fat meat, fish, and poultry.  Limit fats such as oils, salad dressings, butter, nuts, and avocado.  Keep a food diary to identify foods that cause symptoms.  Avoid foods that cause reflux. These may be different for different people.  Eat frequent small meals instead of three large meals each day.  Eat your meals slowly, in a relaxed setting.  Limit fried foods.  Cook foods using methods other than frying.  Avoid drinking alcohol.  Avoid drinking large amounts of liquids with your meals.  Avoid bending over or lying down until 2-3 hours after eating. WHAT FOODS ARE NOT  RECOMMENDED? The following are some foods and drinks that may worsen your symptoms: Vegetables Tomatoes. Tomato juice. Tomato and spaghetti sauce. Chili peppers. Onion and garlic. Horseradish. Fruits Oranges, grapefruit, and lemon (fruit and juice). Meats High-fat meats, fish, and poultry. This includes hot dogs, ribs, ham, sausage, salami, and bacon. Dairy Whole milk and chocolate milk. Sour cream. Cream. Butter. Ice cream. Cream cheese.  Beverages Coffee and tea, with or without caffeine. Carbonated beverages or energy drinks. Condiments Hot sauce. Barbecue sauce.  Sweets/Desserts Chocolate and cocoa. Donuts. Peppermint and spearmint. Fats and Oils High-fat foods, including [place] fries and potato chips. Other Vinegar. Strong spices, such as black pepper, white pepper, red pepper, cayenne, curry powder, cloves, ginger, and chili powder. The items listed above may not be a complete list of foods and beverages to avoid. Contact your dietitian for more information. Document Released: 10/10/2005 Document Revised: 10/15/2013 Document Reviewed: 08/14/2013 [facility] Patient Information 2015 [place], [place]. This information is not intended to replace advice given to you by your health care provider. Make sure you discuss any questions you have with your health care provider.

## 2014-09-12 LAB — BASIC METABOLIC PANEL WITH GFR
BUN: 14 mg/dL (ref 6–23)
CHLORIDE: 102 meq/L (ref 96–112)
CO2: 28 mEq/L (ref 19–32)
Calcium: 9.5 mg/dL (ref 8.4–10.5)
Creat: 0.94 mg/dL (ref 0.50–1.35)
GFR, EST NON AFRICAN AMERICAN: 83 mL/min
GFR, Est African American: >89 mL/min
Glucose, Bld: 196 mg/dL — ABNORMAL HIGH (ref 70–99)
POTASSIUM: 3.8 meq/L (ref 3.5–5.3)
SODIUM: 139 meq/L (ref 135–145)

## 2014-09-12 LAB — HEPATIC FUNCTION PANEL
ALK PHOS: 34 U/L — AB (ref 39–117)
ALT: 18 U/L (ref 0–53)
AST: 15 U/L (ref 0–37)
Albumin: 4.5 g/dL (ref 3.5–5.2)
BILIRUBIN DIRECT: 0.3 mg/dL (ref 0.0–0.3)
Indirect Bilirubin: 0.8 mg/dL (ref 0.2–1.2)
Total Bilirubin: 1.1 mg/dL (ref 0.2–1.2)
Total Protein: 6.5 g/dL (ref 6.0–8.3)

## 2014-09-12 LAB — URINALYSIS, ROUTINE W REFLEX MICROSCOPIC
Bilirubin Urine: NEGATIVE
GLUCOSE, UA: NEGATIVE mg/dL
Hgb urine dipstick: NEGATIVE
KETONES UR: NEGATIVE mg/dL
Leukocytes, UA: NEGATIVE
Nitrite: NEGATIVE
PH: 5 (ref 5.0–8.0)
Protein, ur: NEGATIVE mg/dL
Specific Gravity, Urine: 1.015 (ref 1.005–1.030)
Urobilinogen, UA: 0.2 mg/dL (ref 0.0–1.0)

## 2014-09-12 LAB — LIPID PANEL
Cholesterol: 99 mg/dL (ref 0–200)
HDL: 44 mg/dL (ref >39)
LDL CALC: 42 mg/dL (ref 0–99)
Total CHOL/HDL Ratio: 2.3 Ratio
Triglycerides: 63 mg/dL (ref <150)
VLDL: 13 mg/dL (ref 0–40)

## 2014-09-12 LAB — MICROALBUMIN / CREATININE URINE RATIO
Creatinine, Urine: 61.1 mg/dL
MICROALB/CREAT RATIO: 3.3 mg/g (ref 0.0–30.0)
Microalb, Ur: 0.2 mg/dL (ref <2.0)

## 2014-09-12 LAB — VITAMIN D 25 HYDROXY (VIT D DEFICIENCY, FRACTURES): VIT D 25 HYDROXY: 71 ng/mL (ref 30–100)

## 2014-09-12 LAB — INSULIN, FASTING: INSULIN FASTING, SERUM: 4.7 u[IU]/mL (ref 2.0–19.6)

## 2014-09-12 LAB — TSH: TSH: 2.588 u[IU]/mL (ref 0.350–4.500)

## 2014-09-12 LAB — MAGNESIUM: MAGNESIUM: 1.8 mg/dL (ref 1.5–2.5)

## 2014-09-12 LAB — PSA: PSA: 0.4 ng/mL (ref <=4.00)

## 2014-09-13 LAB — URINE CULTURE
Colony Count: NO GROWTH
Organism ID, Bacteria: NO GROWTH

## 2014-09-24 ENCOUNTER — Other Ambulatory Visit: Payer: Self-pay | Admitting: *Deleted

## 2014-09-24 DIAGNOSIS — Z1212 Encounter for screening for malignant neoplasm of rectum: Secondary | ICD-10-CM

## 2014-09-24 LAB — POC HEMOCCULT BLD/STL (HOME/3-CARD/SCREEN)
Card #2 Fecal Occult Blod, POC: NEGATIVE
Card #3 Fecal Occult Blood, POC: NEGATIVE
Fecal Occult Blood, POC: NEGATIVE

## 2014-10-10 ENCOUNTER — Ambulatory Visit (INDEPENDENT_AMBULATORY_CARE_PROVIDER_SITE_OTHER): Payer: Medicare Other | Admitting: Ophthalmology

## 2014-10-10 DIAGNOSIS — H43813 Vitreous degeneration, bilateral: Secondary | ICD-10-CM | POA: Diagnosis not present

## 2014-10-10 DIAGNOSIS — E11311 Type 2 diabetes mellitus with unspecified diabetic retinopathy with macular edema: Secondary | ICD-10-CM | POA: Diagnosis not present

## 2014-10-10 DIAGNOSIS — E11331 Type 2 diabetes mellitus with moderate nonproliferative diabetic retinopathy with macular edema: Secondary | ICD-10-CM | POA: Diagnosis not present

## 2014-10-10 DIAGNOSIS — E11339 Type 2 diabetes mellitus with moderate nonproliferative diabetic retinopathy without macular edema: Secondary | ICD-10-CM

## 2014-10-10 DIAGNOSIS — I10 Essential (primary) hypertension: Secondary | ICD-10-CM

## 2014-10-10 DIAGNOSIS — H35033 Hypertensive retinopathy, bilateral: Secondary | ICD-10-CM | POA: Diagnosis not present

## 2014-10-10 DIAGNOSIS — H35372 Puckering of macula, left eye: Secondary | ICD-10-CM | POA: Diagnosis not present

## 2014-11-26 ENCOUNTER — Telehealth: Payer: Self-pay | Admitting: Oncology

## 2014-11-26 NOTE — Telephone Encounter (Signed)
returned call and s.w pt and confirmed appts....pt ok and aware °

## 2014-11-28 ENCOUNTER — Other Ambulatory Visit (HOSPITAL_BASED_OUTPATIENT_CLINIC_OR_DEPARTMENT_OTHER): Payer: Medicare Other

## 2014-11-28 LAB — FERRITIN CHCC: Ferritin: 84 ng/ml (ref 22–316)

## 2014-12-03 ENCOUNTER — Telehealth: Payer: Self-pay | Admitting: *Deleted

## 2014-12-03 NOTE — Telephone Encounter (Signed)
Called pt with ferritin results. Ferritin is OK, per Dr. Benay Spice. Will plan for phlebotomy if greater than 100. Follow up as scheduled. Pt voiced understanding. June lab appt confirmed.

## 2014-12-03 NOTE — Telephone Encounter (Signed)
-----   Message from [name], MD sent at 12/02/2014  8:00 AM EST ----- Please call patient, ferritin ok, plan for phlebotomy for greater than 100, repeat 3-4 months should be scheduled

## 2014-12-11 ENCOUNTER — Other Ambulatory Visit: Payer: Self-pay | Admitting: Internal Medicine

## 2014-12-11 ENCOUNTER — Other Ambulatory Visit: Payer: Self-pay | Admitting: Physician Assistant

## 2014-12-15 ENCOUNTER — Other Ambulatory Visit: Payer: Self-pay | Admitting: Internal Medicine

## 2015-01-13 ENCOUNTER — Ambulatory Visit (INDEPENDENT_AMBULATORY_CARE_PROVIDER_SITE_OTHER): Payer: Medicare Other | Admitting: Internal Medicine

## 2015-01-13 ENCOUNTER — Encounter: Payer: Self-pay | Admitting: Internal Medicine

## 2015-01-13 VITALS — BP 146/78 | HR 60 | Temp 97.5°F | Resp 16 | Ht 64.75 in | Wt 198.7 lb

## 2015-01-13 DIAGNOSIS — Z79899 Other long term (current) drug therapy: Secondary | ICD-10-CM

## 2015-01-13 DIAGNOSIS — E782 Mixed hyperlipidemia: Secondary | ICD-10-CM | POA: Diagnosis not present

## 2015-01-13 DIAGNOSIS — M109 Gout, unspecified: Secondary | ICD-10-CM

## 2015-01-13 DIAGNOSIS — E559 Vitamin D deficiency, unspecified: Secondary | ICD-10-CM

## 2015-01-13 DIAGNOSIS — N189 Chronic kidney disease, unspecified: Secondary | ICD-10-CM | POA: Diagnosis not present

## 2015-01-13 DIAGNOSIS — E1122 Type 2 diabetes mellitus with diabetic chronic kidney disease: Secondary | ICD-10-CM | POA: Diagnosis not present

## 2015-01-13 DIAGNOSIS — G4733 Obstructive sleep apnea (adult) (pediatric): Secondary | ICD-10-CM

## 2015-01-13 DIAGNOSIS — I10 Essential (primary) hypertension: Secondary | ICD-10-CM | POA: Diagnosis not present

## 2015-01-13 DIAGNOSIS — Z1331 Encounter for screening for depression: Secondary | ICD-10-CM

## 2015-01-13 DIAGNOSIS — Z86010 Personal history of colonic polyps: Secondary | ICD-10-CM

## 2015-01-13 DIAGNOSIS — E1129 Type 2 diabetes mellitus with other diabetic kidney complication: Secondary | ICD-10-CM | POA: Diagnosis not present

## 2015-01-13 DIAGNOSIS — Z9181 History of falling: Secondary | ICD-10-CM

## 2015-01-13 LAB — BASIC METABOLIC PANEL WITH GFR
BUN: 18 mg/dL (ref 6–23)
CALCIUM: 9.7 mg/dL (ref 8.4–10.5)
CHLORIDE: 100 meq/L (ref 96–112)
CO2: 30 mEq/L (ref 19–32)
CREATININE: 0.95 mg/dL (ref 0.50–1.35)
GFR, Est Non African American: 82 mL/min
GLUCOSE: 316 mg/dL — AB (ref 70–99)
Potassium: 3.9 mEq/L (ref 3.5–5.3)
Sodium: 137 mEq/L (ref 135–145)

## 2015-01-13 LAB — CBC WITH DIFFERENTIAL/PLATELET
Basophils Absolute: 0 10*3/uL (ref 0.0–0.1)
Basophils Relative: 0 % (ref 0–1)
EOS PCT: 3 % (ref 0–5)
Eosinophils Absolute: 0.2 10*3/uL (ref 0.0–0.7)
HEMATOCRIT: 41.9 % (ref 39.0–52.0)
HEMOGLOBIN: 14.6 g/dL (ref 13.0–17.0)
LYMPHS ABS: 1.4 10*3/uL (ref 0.7–4.0)
LYMPHS PCT: 27 % (ref 12–46)
MCH: 32 pg (ref 26.0–34.0)
MCHC: 34.8 g/dL (ref 30.0–36.0)
MCV: 91.9 fL (ref 78.0–100.0)
MONO ABS: 0.4 10*3/uL (ref 0.1–1.0)
MONOS PCT: 8 % (ref 3–12)
MPV: 9.2 fL (ref 8.6–12.4)
Neutro Abs: 3.3 10*3/uL (ref 1.7–7.7)
Neutrophils Relative %: 62 % (ref 43–77)
PLATELETS: 180 10*3/uL (ref 150–400)
RBC: 4.56 MIL/uL (ref 4.22–5.81)
RDW: 13.5 % (ref 11.5–15.5)
WBC: 5.3 10*3/uL (ref 4.0–10.5)

## 2015-01-13 LAB — LIPID PANEL
CHOL/HDL RATIO: 2.8 ratio
CHOLESTEROL: 106 mg/dL (ref 0–200)
HDL: 38 mg/dL — AB (ref >=40)
LDL Cholesterol: 54 mg/dL (ref 0–99)
Triglycerides: 71 mg/dL (ref <150)
VLDL: 14 mg/dL (ref 0–40)

## 2015-01-13 LAB — HEPATIC FUNCTION PANEL
ALT: 16 U/L (ref 0–53)
AST: 13 U/L (ref 0–37)
Albumin: 4.7 g/dL (ref 3.5–5.2)
Alkaline Phosphatase: 32 U/L — ABNORMAL LOW (ref 39–117)
BILIRUBIN DIRECT: 0.2 mg/dL (ref 0.0–0.3)
Indirect Bilirubin: 0.7 mg/dL (ref 0.2–1.2)
TOTAL PROTEIN: 6.4 g/dL (ref 6.0–8.3)
Total Bilirubin: 0.9 mg/dL (ref 0.2–1.2)

## 2015-01-13 LAB — MAGNESIUM: Magnesium: 1.6 mg/dL (ref 1.5–2.5)

## 2015-01-13 LAB — URIC ACID: Uric Acid, Serum: 4.3 mg/dL (ref 4.0–7.8)

## 2015-01-13 NOTE — Patient Instructions (Signed)

## 2015-01-13 NOTE — Progress Notes (Signed)
Patient ID: Brett [name], male   DOB: [birth year], 69 y.o.   MRN: [id number]  MEDICARE ANNUAL WELLNESS VISIT AND OV  Assessment:   1. Essential hypertension  - TSH  2. Hyperlipidemia  - Lipid panel  3. Type 2 diabetes mellitus with diabetic chronic kidney disease  - Hemoglobin A1c  4. Vitamin D deficiency  - Vit D  25 hydroxy (rtn osteoporosis monitoring)  5. Gout, unspecified cause, unspecified chronicity, unspecified site  - Uric acid  6. OSA (obstructive sleep apnea)  - CBC with Differential/Platelet - BASIC METABOLIC PANEL WITH GFR - Hepatic function panel - Magnesium  7. Depression screen   8. At low risk for fall   Plan:   During the course of the visit the patient was educated and counseled about appropriate screening and preventive services including:    Pneumococcal vaccine   Influenza vaccine  Td vaccine  Screening electrocardiogram  Bone densitometry screening  Colorectal cancer screening  Diabetes Treatment  Glaucoma screening  Nutrition counseling   Advanced directives: requested  Screening recommendations, referrals: Vaccinations: Immunization History  Administered Date(s) Administered  . DT 01/21/2014  . Influenza Split 09/11/2013  . Influenza,inj,Quad PF,36+ Mos 07/28/2014  . Influenza-Unspecified 08/21/2012  . Pneumococcal Conjugate-13 09/11/2014  . Pneumococcal-Unspecified 08/21/2012  Shingles vaccine undecided Hep B vaccine not indicated  Nutrition assessed and recommended  Colonoscopy 2006 Recommended yearly ophthalmology/optometry visit for glaucoma screening and checkup Recommended yearly dental visit for hygiene and checkup Advanced directives - yes  Conditions/risks identified: BMI: Discussed weight loss, diet, and increase physical activity.  Increase physical activity: AHA recommends 150 minutes of physical activity a week.  Medications reviewed Diabetes is not at goal, ACE/ARB therapy: Yes. Urinary  Incontinence is not an issue: discussed non pharmacology and pharmacology options.  Fall risk: low- discussed PT, home fall assessment, medications.   Subjective:    Brett [name]  for [employer] Visit and OV.  Date of last medicare wellness visit was 11.19.2015.  This very nice 69 y.o. DWM  presents for 3 month follow up with Hypertension, Hyperlipidemia, T2_NIDDM w/ CKD and Vitamin D Deficiency.    Patient is treated for HTN & BP has been controlled at home. Today's BP: (!) 146/78 mmHg. Patient has had no complaints of any cardiac type chest pain, palpitations, dyspnea/orthopnea/PND, dizziness, claudication, or dependent edema.   Hyperlipidemia is controlled with diet & meds. Patient denies myalgias or other med SE's. 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  Last A1c was 7.9% on 09/11/2014 - not at goal due to overeating .   Further, the patient also has history of Vitamin D Deficiency and supplements vitamin D without any suspected side-effects. Last vitamin D was  71 09/11/2014.   Names of Other Physician/Practitioners you currently use: 1. [facility] here for primary care 2. Dr Tempie Hoist, eye doctor, last visit Dec 2015 3. Dr ? Name, DDS, dentist, last visit 2015  Patient Care Team: [name], MD as PCP - General (Internal Medicine) [name], MD as Consulting Physician (Oncology) Lelon Perla, MD as Consulting Physician (Cardiology) [name], MD as Consulting Physician (Ophthalmology)  Medication Review: Medication Sig  . allopurinol (ZYLOPRIM) 300 MG tablet Take 1 tablet by mouth  daily  . aspirin 81 MG tablet Take 81 mg by mouth daily.    Marland Kitchen atorvastatin (LIPITOR) 20 MG tablet Take 1 tablet by  mouth  daily  . bisoprolol-hydrochlorothiazide (ZIAC) 5-6.25 MG per tablet Take 1 tablet by mouth  daily  . Cholecalciferol (VITAMIN D) 1000 UNITS capsule Take 5,000 Units by mouth daily.   . furosemide (LASIX) 20 MG tablet Take 1 tablet by mouth  every day for blood  pressure and fluid  . glipiZIDE (GLUCOTROL) 10 MG tablet Take 1 tablet (10 mg total) by mouth daily before breakfast.  . Loratadine (CLARITIN PO) Take by mouth daily.    Marland Kitchen losartan (COZAAR) 100 MG tablet Take 1 tablet by mouth  daily  . magnesium oxide (MAG-OX) 400 MG tablet Take 400 mg by mouth daily.  . metFORMIN (GLUCOPHAGE-XR) 500 MG 24 hr tablet Take 2 tablets (1,000 mg total) by mouth 2 (two) times daily.  . minoxidil (LONITEN) 10 MG tablet Take 1 tablet by mouth  daily  . ranitidine (ZANTAC) 300 MG tablet Take 1 tablet (300 mg total) by mouth at bedtime.   Current Problems (verified) Patient Active Problem List   Diagnosis Date Noted  . Hyperlipidemia 01/21/2014  . Vitamin D deficiency 01/21/2014  . Encounter for long-term (current) use of other medications 01/21/2014  . Type II diabetes mellitus with renal manifestations   . Hypertension   . OSA (obstructive sleep apnea)   . Gout   . Dilated cardiomyopathy   . Hemochromatosis 09/27/2011   Screening Tests Health Maintenance  Topic Date Due  . TETANUS/TDAP  07/22/1965  . ZOSTAVAX  07/22/2006  . OPHTHALMOLOGY EXAM  10/11/2014  . COLONOSCOPY  10/24/2014  . HEMOGLOBIN A1C  03/12/2015  . INFLUENZA VACCINE  05/25/2015  . FOOT EXAM  09/12/2015  . URINE MICROALBUMIN  09/12/2015  . PNA vac Low Risk Adult  Completed    Immunization History  Administered Date(s) Administered  . DT 01/21/2014  . Influenza Split 09/11/2013  . Influenza,inj,Quad PF,36+ Mos 07/28/2014  . Influenza-Unspecified 08/21/2012  . Pneumococcal Conjugate-13 09/11/2014  . Pneumococcal-Unspecified 08/21/2012    Preventative care: Last colonoscopy: 2006   History reviewed: allergies,  current medications, past family history, past medical history, past social history, past surgical history and problem list  Risk Factors: Tobacco History  Substance Use Topics  . Smoking status: Former Smoker    Quit date: 11/24/1966  . Smokeless tobacco: Never Used  . Alcohol Use: Yes     Comment: Occasionally   He does not smoke.  Patient is a former smoker. Are there smokers in your home (other than you)?  No  Alcohol Current alcohol use: occasional mixed drink  Caffeine Current caffeine use: coffee 3 c7ups /day  Exercise Current exercise: walking  Nutrition/Diet Current diet: in general, an "unhealthy" diet  Cardiac risk factors: advanced age (older than 24 for men, 61 for women), diabetes mellitus, dyslipidemia, hypertension, male gender, obesity (BMI >= 30 kg/m2), sedentary lifestyle and smoking/ tobacco exposure.  Depression Screen (Note: if answer to either of the following is "Yes", a more complete depression screening is indicated)   Q1: Over the past two weeks, have you felt down, depressed or hopeless? No  Q2: Over the past two weeks, have you felt little interest or pleasure in doing things? No  Have you lost interest or pleasure in daily life? No  Do you often feel hopeless? No  Do you cry easily over simple problems? No  Activities of Daily Living  In your present state of health, do you have any difficulty performing the following activities?:  Driving? No Managing money?  No Feeding yourself? No Getting from bed to chair? No Climbing a flight of stairs? No Preparing food and eating?: No Bathing or showering? No Getting dressed: No Getting to the toilet? No Using the toilet:No Moving around from place to place: No In the past year have you fallen or had a near fall?:No   Are you sexually active?  No  Do you have more than one partner?  No  Vision Difficulties: No  Hearing Difficulties: No Do you often ask people to speak up or repeat  themselves? No Do you experience ringing or noises in your ears? No Do you have difficulty understanding soft or whispered voices? No  Cognition  Do you feel that you have a problem with memory?No  Do you often misplace items? No  Do you feel safe at home?  Yes  Advanced directives Does patient have a [place]? Yes Does patient have a Living Will? Yes  Past Medical History  Diagnosis Date  . Diabetes mellitus without complication   . OSA (obstructive sleep apnea)   . Allergy   . Gout   . Hemochromatosis   . Dilated cardiomyopathy     non isch  . Hypertension   . Colon polyp    Past Surgical History  Procedure Laterality Date  . Eye surgery Bilateral     Cataract   ROS: Constitutional: Denies fever, chills, weight loss/gain, headaches, insomnia, fatigue, night sweats or change in appetite. Eyes: Denies redness, blurred vision, diplopia, discharge, itchy or watery eyes.  ENT: Denies discharge, congestion, post nasal drip, epistaxis, sore throat, earache, hearing loss, dental pain, Tinnitus, Vertigo, Sinus pain or snoring.  Cardio: Denies chest pain, palpitations, irregular heartbeat, syncope, dyspnea, diaphoresis, orthopnea, PND, claudication or edema Respiratory: denies cough, dyspnea, DOE, pleurisy, hoarseness, laryngitis or wheezing.  Gastrointestinal: Denies dysphagia, heartburn, reflux, water brash, pain, cramps, nausea, vomiting, bloating, diarrhea, constipation, hematemesis, melena, hematochezia, jaundice or hemorrhoids Genitourinary: Denies dysuria, frequency, urgency, nocturia, hesitancy, discharge, hematuria or flank pain Musculoskeletal: Denies arthralgia, myalgia, stiffness, Jt. Swelling, pain, limp or strain/sprain. Denies Falls. Skin: Denies puritis, rash, hives, warts, acne, eczema or change in skin lesion Neuro: No weakness, tremor, incoordination, spasms, paresthesia or pain Psychiatric: Denies confusion, memory loss or sensory loss. Denies  Depression. Endocrine: Denies change in weight, skin, hair change, nocturia, and paresthesia, diabetic polys, visual blurring or hyper / hypo glycemic episodes.  Heme/Lymph: No excessive bleeding, bruising or enlarged lymph nodes.  Objective:     BP 146/78 mmHg  Pulse 60  Temp(Src) 97.5 F (36.4 C)  Resp 16  Ht 5' 4.75" (1.645 m)  Wt 198 lb 11.2 oz (90.13 kg)  BMI 33.31 kg/m2  General Appearance:  Alert  WD/WN, male , in no apparent distress. Eyes: PERRLA, EOMs, conjunctiva no swelling or erythema, normal fundi and vessels with few dot & blot H & E & scattered areas of retinal scarring. Sinuses: No frontal/maxillary tenderness ENT/Mouth: EACs patent / TMs  nl. Nares clear without erythema, swelling, mucoid exudates. Oral hygiene is good. No erythema, swelling, or exudate. Tongue normal, non-obstructing. Tonsils not swollen or erythematous. Hearing normal.  Neck: Supple, thyroid normal. No bruits, nodes or JVD. Respiratory: Respiratory effort normal.  BS equal and clear bilateral without rales, rhonci, wheezing or stridor. Cardio: Heart sounds are normal with regular rate and rhythm and no murmurs, rubs or gallops. Peripheral  pulses are normal and equal bilaterally without edema. No aortic or femoral bruits. Chest: symmetric with normal excursions and percussion.  Abdomen: Flat, soft, with nl bowel sounds. Nontender, no guarding, rebound, hernias, masses, or organomegaly.  Lymphatics: Non tender without lymphadenopathy.  Genitourinary: No hernias.Testes nl. DRE - prostate nl for age - smooth & firm w/o nodules. Musculoskeletal: Full ROM all peripheral extremities, joint stability, 5/5 strength, and normal gait. Skin: Warm and dry without rashes, lesions, cyanosis, clubbing or  ecchymosis.  Neuro: Cranial nerves intact,DTR's are flat throughout. Normal muscle tone, no cerebellar symptoms. Sensation intact by monofilament testing to the toes bilaterally.  Pysch: Awake and oriented X 3 with  normal affect, insight and judgment appropriate.   Cognitive Testingequal bilaterally  Alert? Yes  Normal Appearance? Yes  Oriented to person? Yes  Place? Yes   Time? Yes  Recall of three objects?  Yes  Can perform simple calculations? Yes  Displays appropriate judgment? Yes  Can read the correct time from a watch/clock? Yes  Medicare Attestation I have personally reviewed: The patient's medical and social history Their use of alcohol, tobacco or illicit drugs Their current medications and supplements The patient's functional ability including ADLs,fall risks, home safety risks, cognitive, and hearing and visual impairment Diet and physical activities Evidence for depression or mood disorders  The patient's weight, height, BMI, and visual acuity have been recorded in the chart.  I have made referrals, counseling, and provided education to the patient based on review of the above and I have provided the patient with a written personalized care plan for preventive services.    Mizael Sagar DAVID, MD   01/13/2015

## 2015-01-14 LAB — VITAMIN D 25 HYDROXY (VIT D DEFICIENCY, FRACTURES): Vit D, 25-Hydroxy: 73 ng/mL (ref 30–100)

## 2015-01-14 LAB — TSH: TSH: 3.298 u[IU]/mL (ref 0.350–4.500)

## 2015-01-14 LAB — HEMOGLOBIN A1C
Hgb A1c MFr Bld: 8.1 % — ABNORMAL HIGH (ref <5.7)
MEAN PLASMA GLUCOSE: 186 mg/dL — AB (ref <117)

## 2015-02-03 ENCOUNTER — Other Ambulatory Visit: Payer: Self-pay | Admitting: *Deleted

## 2015-02-03 ENCOUNTER — Other Ambulatory Visit: Payer: Self-pay | Admitting: Internal Medicine

## 2015-02-03 MED ORDER — METFORMIN HCL ER 500 MG PO TB24
1000.0000 mg | ORAL_TABLET | Freq: Two times a day (BID) | ORAL | Status: DC
Start: 2015-02-03 — End: 2015-07-01

## 2015-02-23 ENCOUNTER — Encounter: Payer: Self-pay | Admitting: Internal Medicine

## 2015-03-31 ENCOUNTER — Other Ambulatory Visit: Payer: Medicare Other

## 2015-05-11 ENCOUNTER — Ambulatory Visit (HOSPITAL_BASED_OUTPATIENT_CLINIC_OR_DEPARTMENT_OTHER): Payer: Medicare Other

## 2015-05-11 ENCOUNTER — Other Ambulatory Visit: Payer: Self-pay | Admitting: Emergency Medicine

## 2015-05-11 ENCOUNTER — Telehealth: Payer: Self-pay | Admitting: Oncology

## 2015-05-11 ENCOUNTER — Other Ambulatory Visit: Payer: Self-pay | Admitting: Internal Medicine

## 2015-05-11 LAB — FERRITIN CHCC: FERRITIN: 129 ng/mL (ref 22–316)

## 2015-05-11 NOTE — Telephone Encounter (Signed)
Pt came in to r/s missed appt

## 2015-05-12 ENCOUNTER — Telehealth: Payer: Self-pay | Admitting: *Deleted

## 2015-05-12 ENCOUNTER — Telehealth: Payer: Self-pay | Admitting: Oncology

## 2015-05-12 NOTE — Telephone Encounter (Signed)
Sent msg to add Phlebotomy and Ferritin to schedule, I will contact pt with D/T .Marland Kitchen.. Also will mail out per patient's request..Marland KitchenMarland Kitchen

## 2015-05-12 NOTE — Telephone Encounter (Signed)
POF sent to scheduler marked as urgent for appts per Dr. Benay Spice. Pt made aware of ferritin level and plan, voices understanding

## 2015-05-12 NOTE — Telephone Encounter (Signed)
Per staff message and POF I have scheduled appts. Advised scheduler of appts. JMW  

## 2015-05-12 NOTE — Telephone Encounter (Signed)
-----   Message from [name], MD sent at 05/11/2015  5:44 PM EDT ----- Please call patient, ferritin is higher, schedule phlebotomy q2wks for 4 treatments, let me know when he can start and I will put in order Ferritin 1 month after 4th phlebotomy

## 2015-05-14 ENCOUNTER — Ambulatory Visit (HOSPITAL_BASED_OUTPATIENT_CLINIC_OR_DEPARTMENT_OTHER): Payer: Medicare Other

## 2015-05-14 NOTE — Patient Instructions (Signed)

## 2015-05-14 NOTE — Progress Notes (Signed)
520g blood removed via peripheral phlebotomy set- L arm antecubital site.

## 2015-05-15 ENCOUNTER — Ambulatory Visit (INDEPENDENT_AMBULATORY_CARE_PROVIDER_SITE_OTHER): Payer: Medicare Other | Admitting: Internal Medicine

## 2015-05-15 ENCOUNTER — Encounter: Payer: Self-pay | Admitting: Internal Medicine

## 2015-05-15 VITALS — BP 134/70 | HR 72 | Temp 98.4°F | Resp 18 | Ht 64.75 in | Wt 198.0 lb

## 2015-05-15 DIAGNOSIS — I10 Essential (primary) hypertension: Secondary | ICD-10-CM

## 2015-05-15 DIAGNOSIS — E1122 Type 2 diabetes mellitus with diabetic chronic kidney disease: Secondary | ICD-10-CM

## 2015-05-15 DIAGNOSIS — E782 Mixed hyperlipidemia: Secondary | ICD-10-CM | POA: Diagnosis not present

## 2015-05-15 DIAGNOSIS — Z79899 Other long term (current) drug therapy: Secondary | ICD-10-CM | POA: Diagnosis not present

## 2015-05-15 DIAGNOSIS — N189 Chronic kidney disease, unspecified: Secondary | ICD-10-CM | POA: Diagnosis not present

## 2015-05-15 DIAGNOSIS — E559 Vitamin D deficiency, unspecified: Secondary | ICD-10-CM | POA: Diagnosis not present

## 2015-05-15 DIAGNOSIS — E1129 Type 2 diabetes mellitus with other diabetic kidney complication: Secondary | ICD-10-CM | POA: Diagnosis not present

## 2015-05-15 LAB — LIPID PANEL
Cholesterol: 125 mg/dL (ref 0–200)
HDL: 43 mg/dL (ref >=40)
LDL CALC: 52 mg/dL (ref 0–99)
Total CHOL/HDL Ratio: 2.9 Ratio
Triglycerides: 148 mg/dL (ref <150)
VLDL: 30 mg/dL (ref 0–40)

## 2015-05-15 LAB — TSH: TSH: 2.637 u[IU]/mL (ref 0.350–4.500)

## 2015-05-15 LAB — BASIC METABOLIC PANEL WITH GFR
BUN: 17 mg/dL (ref 6–23)
CO2: 26 mEq/L (ref 19–32)
CREATININE: 0.97 mg/dL (ref 0.50–1.35)
Calcium: 9.5 mg/dL (ref 8.4–10.5)
Chloride: 100 mEq/L (ref 96–112)
GFR, Est Non African American: 80 mL/min
Glucose, Bld: 415 mg/dL — ABNORMAL HIGH (ref 70–99)
Potassium: 4.5 mEq/L (ref 3.5–5.3)
SODIUM: 138 meq/L (ref 135–145)

## 2015-05-15 LAB — CBC WITH DIFFERENTIAL/PLATELET
BASOS ABS: 0 10*3/uL (ref 0.0–0.1)
BASOS PCT: 0 % (ref 0–1)
EOS ABS: 0.1 10*3/uL (ref 0.0–0.7)
EOS PCT: 2 % (ref 0–5)
HEMATOCRIT: 39.6 % (ref 39.0–52.0)
Hemoglobin: 13.8 g/dL (ref 13.0–17.0)
Lymphocytes Relative: 23 % (ref 12–46)
Lymphs Abs: 1.2 10*3/uL (ref 0.7–4.0)
MCH: 31.8 pg (ref 26.0–34.0)
MCHC: 34.8 g/dL (ref 30.0–36.0)
MCV: 91.2 fL (ref 78.0–100.0)
MPV: 9.6 fL (ref 8.6–12.4)
Monocytes Absolute: 0.4 10*3/uL (ref 0.1–1.0)
Monocytes Relative: 7 % (ref 3–12)
NEUTROS PCT: 68 % (ref 43–77)
Neutro Abs: 3.5 10*3/uL (ref 1.7–7.7)
Platelets: 172 10*3/uL (ref 150–400)
RBC: 4.34 MIL/uL (ref 4.22–5.81)
RDW: 13.3 % (ref 11.5–15.5)
WBC: 5.1 10*3/uL (ref 4.0–10.5)

## 2015-05-15 LAB — HEPATIC FUNCTION PANEL
ALT: 19 U/L (ref 0–53)
AST: 12 U/L (ref 0–37)
Albumin: 4.3 g/dL (ref 3.5–5.2)
Alkaline Phosphatase: 34 U/L — ABNORMAL LOW (ref 39–117)
Bilirubin, Direct: 0.2 mg/dL (ref 0.0–0.3)
Indirect Bilirubin: 0.5 mg/dL (ref 0.2–1.2)
TOTAL PROTEIN: 6.6 g/dL (ref 6.0–8.3)
Total Bilirubin: 0.7 mg/dL (ref 0.2–1.2)

## 2015-05-15 LAB — HEMOGLOBIN A1C
HEMOGLOBIN A1C: 8.8 % — AB (ref <5.7)
MEAN PLASMA GLUCOSE: 206 mg/dL — AB (ref <117)

## 2015-05-15 LAB — MAGNESIUM: Magnesium: 1.9 mg/dL (ref 1.5–2.5)

## 2015-05-15 NOTE — Progress Notes (Signed)
Patient ID: [name], male   DOB: 07-14-1946, 69 y.o.   MRN: [id number]  Assessment and Plan:  Hypertension:  -Continue medication -monitor blood pressure at home. -Continue DASH diet -Reminder to go to the ER if any CP, SOB, nausea, dizziness, severe HA, changes vision/speech, left arm numbness and tingling and jaw pain.  Cholesterol - Continue diet and exercise -Check cholesterol.   Diabetes with diabetic chronic kidney disease -Continue diet and exercise.  -likely needs addition of a medication based on sugar levels given here today but will adjust based on A1C level today -Check A1C  Vitamin D Def -check level -continue medications.   Continue diet and meds as discussed. Further disposition pending results of labs. Discussed med's effects and SE's.    HPI 69 y.o. male  presents for 3 month follow up with hypertension, hyperlipidemia, diabetes and vitamin D deficiency.   His blood pressure has been controlled at home, today their BP is BP: 134/70 mmHg.He does workout. He denies chest pain, shortness of breath, dizziness.   He is on cholesterol medication and denies myalgias. His cholesterol is at goal. The cholesterol was:  01/13/2015: Cholesterol 106; HDL 38*; LDL Cholesterol 54; Triglycerides 71   He has been working on diet and exercise for diabetes with diabetic chronic kidney disease, he is on bASA, he is on ACE/ARB, and denies  foot ulcerations, hyperglycemia, hypoglycemia , increased appetite, nausea, paresthesia of the feet, polydipsia, polyuria, visual disturbances, vomiting and weight loss. Last A1C was: 01/13/2015: Hgb A1c MFr Bld 8.1*   Patient is on Vitamin D supplement. 01/13/2015: Vit D, 25-Hydroxy 73  He reports that he has been having some high blood sugars. He reports that he had blood sugars of 400 which scared him one day.  He can't remember what he ate the previous day.  He reports that the sugars got more out of control in may.    Current Medications:   Current Outpatient Prescriptions on File Prior to Visit  Medication Sig Dispense Refill  . allopurinol (ZYLOPRIM) 300 MG tablet Take 1 tablet by mouth  daily 30 tablet 0  . aspirin 81 MG tablet Take 81 mg by mouth daily.      Marland Kitchen atorvastatin (LIPITOR) 20 MG tablet Take 1 tablet by mouth  daily 30 tablet 0  . bisoprolol-hydrochlorothiazide (ZIAC) 5-6.25 MG per tablet Take 1 tablet by mouth  daily 90 tablet 1  . Cholecalciferol (VITAMIN D) 1000 UNITS capsule Take 5,000 Units by mouth daily.     . furosemide (LASIX) 20 MG tablet TAKE 1 TABLET BY MOUTH  EVERY DAY FOR BLOOD  PRESSURE AND FLUID 30 tablet 0  . Loratadine (CLARITIN PO) Take by mouth daily.      Marland Kitchen losartan (COZAAR) 100 MG tablet Take 1 tablet by mouth  daily 30 tablet 0  . magnesium oxide (MAG-OX) 400 MG tablet Take 400 mg by mouth daily.    . metFORMIN (GLUCOPHAGE-XR) 500 MG 24 hr tablet Take 2 tablets (1,000 mg total) by mouth 2 (two) times daily. 360 tablet 3  . metFORMIN (GLUCOPHAGE-XR) 500 MG 24 hr tablet Take 2 tablets by mouth two times daily 360 tablet 3  . minoxidil (LONITEN) 10 MG tablet Take 1 tablet by mouth  daily 30 tablet 0  . ranitidine (ZANTAC) 300 MG tablet Take 1 tablet by mouth at  bedtime 90 tablet 1   No current facility-administered medications on file prior to visit.   Medical History:  Past Medical History  Diagnosis Date  . Diabetes mellitus without complication   . OSA (obstructive sleep apnea)   . Allergy   . Gout   . Hemochromatosis   . Dilated cardiomyopathy     non isch  . Hypertension   . Colon polyp    Allergies: No Known Allergies   Review of Systems:  Review of Systems  Constitutional: Negative for fever, chills and malaise/fatigue.  HENT: Negative for congestion, ear pain and sore throat.   Eyes: Negative.   Respiratory: Negative for cough, shortness of breath and wheezing.   Cardiovascular: Negative for chest pain, palpitations and leg swelling.  Gastrointestinal: Negative for  heartburn, diarrhea, constipation, blood in stool and melena.  Genitourinary: Negative.   Skin: Negative.   Neurological: Negative for dizziness, sensory change, loss of consciousness and headaches.  Psychiatric/Behavioral: Negative for depression. The patient is not nervous/anxious and does not have insomnia.     Family history- Review and unchanged  Social history- Review and unchanged  Physical Exam: BP 134/70 mmHg  Pulse 72  Temp(Src) 98.4 F (36.9 C) (Temporal)  Resp 18  Ht 5' 4.75" (1.645 m)  Wt 198 lb (89.812 kg)  BMI 33.19 kg/m2 Wt Readings from Last 3 Encounters:  05/15/15 198 lb (89.812 kg)  01/13/15 198 lb 11.2 oz (90.13 kg)  09/11/14 198 lb (89.812 kg)   General Appearance: Well nourished well developed, non-toxic appearing, in no apparent distress. Eyes: PERRLA, EOMs, conjunctiva no swelling or erythema ENT/Mouth: Ear canals clear with no erythema, swelling, or discharge.  TMs normal bilaterally, oropharynx clear, moist, with no exudate.   Neck: Supple, thyroid normal, no JVD, no cervical adenopathy.  Respiratory: Respiratory effort normal, breath sounds clear A&P, no wheeze, rhonchi or rales noted.  No retractions, no accessory muscle usage Cardio: RRR with no MRGs. No noted edema.  Abdomen: Soft, + BS.  Non tender, no guarding, rebound, hernias, masses. Musculoskeletal: Full ROM, 5/5 strength, Normal gait Skin: Warm, dry without rashes, lesions, ecchymosis.  Neuro: Awake and oriented X 3, Cranial nerves intact. No cerebellar symptoms.  Psych: normal affect, Insight and Judgment appropriate.    Starlyn Skeans, PA-C 9:01 AM [facility]

## 2015-05-16 LAB — VITAMIN D 25 HYDROXY (VIT D DEFICIENCY, FRACTURES): VIT D 25 HYDROXY: 67 ng/mL (ref 30–100)

## 2015-05-18 ENCOUNTER — Other Ambulatory Visit: Payer: Self-pay | Admitting: Internal Medicine

## 2015-05-18 LAB — INSULIN, RANDOM: Insulin: 7.7 u[IU]/mL (ref 2.0–19.6)

## 2015-05-18 MED ORDER — SITAGLIPTIN PHOSPHATE 100 MG PO TABS: 100.0000 mg | ORAL_TABLET | Freq: Every day | ORAL | Status: DC

## 2015-05-19 ENCOUNTER — Other Ambulatory Visit: Payer: Self-pay | Admitting: Internal Medicine

## 2015-05-19 NOTE — Progress Notes (Signed)
Patient aware.  Advised him per Starlyn Skeans, PA-C to stop glipizide and continue Metformin Rx with the addition of Januvia.  Patient expressed understanding and advised him to call if any further questions.

## 2015-05-27 ENCOUNTER — Other Ambulatory Visit: Payer: Self-pay | Admitting: *Deleted

## 2015-05-27 ENCOUNTER — Telehealth: Payer: Self-pay | Admitting: Oncology

## 2015-05-27 NOTE — Telephone Encounter (Signed)
S/w pt confirming no labs except every 3 months, cancelled labs until Oct. Pt confirmed Phlebotomy... KJ

## 2015-05-28 ENCOUNTER — Ambulatory Visit (HOSPITAL_BASED_OUTPATIENT_CLINIC_OR_DEPARTMENT_OTHER): Payer: Medicare Other

## 2015-05-28 ENCOUNTER — Other Ambulatory Visit: Payer: Medicare Other

## 2015-05-28 NOTE — Patient Instructions (Signed)

## 2015-05-28 NOTE — Progress Notes (Signed)
Reviewed post phlebotomy instructions with patient.

## 2015-06-11 ENCOUNTER — Other Ambulatory Visit: Payer: Medicare Other

## 2015-06-11 ENCOUNTER — Ambulatory Visit (HOSPITAL_BASED_OUTPATIENT_CLINIC_OR_DEPARTMENT_OTHER): Payer: Medicare Other

## 2015-06-11 NOTE — Progress Notes (Signed)
From 080 to 0810, 500 ml phlebotomy completed in Lt arm. Pt tolerated procedure well. Able to  Tolerate  Po fluids and snack well. At  [place], pt  Discharged, VSS, denies distress. Lt AC arm site CDI, no bleeding noted.

## 2015-06-11 NOTE — Patient Instructions (Signed)

## 2015-06-18 ENCOUNTER — Other Ambulatory Visit: Payer: Self-pay | Admitting: Internal Medicine

## 2015-06-18 ENCOUNTER — Encounter: Payer: Self-pay | Admitting: Internal Medicine

## 2015-06-25 ENCOUNTER — Other Ambulatory Visit: Payer: Medicare Other

## 2015-06-25 ENCOUNTER — Ambulatory Visit (HOSPITAL_BASED_OUTPATIENT_CLINIC_OR_DEPARTMENT_OTHER): Payer: Medicare Other

## 2015-06-25 NOTE — Patient Instructions (Signed)

## 2015-06-25 NOTE — Progress Notes (Signed)
Phlebotomy performed in left antecubital with 16 G needle without difficulty.  Approx.  538 gm of blood obtained and wasted.  Pt tolerated procedure without problems.  Nourishments given. 6945 -  Pt was discharged in stable condition via ambulatory by self with steady gait.

## 2015-06-26 ENCOUNTER — Encounter: Payer: Self-pay | Admitting: Internal Medicine

## 2015-06-26 ENCOUNTER — Other Ambulatory Visit: Payer: Self-pay | Admitting: Internal Medicine

## 2015-06-26 DIAGNOSIS — E1165 Type 2 diabetes mellitus with hyperglycemia: Secondary | ICD-10-CM

## 2015-06-26 MED ORDER — GLUCOSE BLOOD VI STRP: ORAL_STRIP | Status: DC

## 2015-07-01 ENCOUNTER — Ambulatory Visit (INDEPENDENT_AMBULATORY_CARE_PROVIDER_SITE_OTHER): Payer: Medicare Other | Admitting: Internal Medicine

## 2015-07-01 ENCOUNTER — Encounter: Payer: Self-pay | Admitting: Internal Medicine

## 2015-07-01 VITALS — BP 126/62 | HR 72 | Temp 98.2°F | Resp 18 | Ht 64.75 in | Wt 194.0 lb

## 2015-07-01 DIAGNOSIS — N189 Chronic kidney disease, unspecified: Secondary | ICD-10-CM

## 2015-07-01 DIAGNOSIS — Z1212 Encounter for screening for malignant neoplasm of rectum: Secondary | ICD-10-CM

## 2015-07-01 DIAGNOSIS — E1165 Type 2 diabetes mellitus with hyperglycemia: Secondary | ICD-10-CM | POA: Diagnosis not present

## 2015-07-01 DIAGNOSIS — E1122 Type 2 diabetes mellitus with diabetic chronic kidney disease: Secondary | ICD-10-CM | POA: Diagnosis not present

## 2015-07-01 MED ORDER — METFORMIN HCL ER 500 MG PO TB24: 1000.0000 mg | ORAL_TABLET | Freq: Two times a day (BID) | ORAL | Status: DC

## 2015-07-01 MED ORDER — GLUCOSE BLOOD VI STRP: ORAL_STRIP | Status: DC

## 2015-07-01 MED ORDER — SITAGLIPTIN PHOSPHATE 100 MG PO TABS: 100.0000 mg | ORAL_TABLET | Freq: Every day | ORAL | Status: DC

## 2015-07-01 NOTE — Progress Notes (Signed)
   Subjective:    Patient ID: [name], male    DOB: 01-18-1946, 69 y.o.   MRN: [id number]  HPI  Patient presents to the office for diabetic teaching.  He reports that his sugars have been out of control recently.  He was on metformin and at the last appointment he was told to start taking [place].  He reports that he stopped taking the metformin when he started taking the [place].  He reports that his blood sugars were consistently over 200.  He reports that he has been eating a lot of veggies for dinner and in the mornings he has been having meat occasionally with dinner.  He reports that he didn't know that he was supposed to take the two medications together.    Review of Systems  Constitutional: Negative for fever, chills and fatigue.  Respiratory: Negative for chest tightness and shortness of breath.   Cardiovascular: Negative for chest pain and palpitations.  Endocrine: Negative for polydipsia, polyphagia and polyuria.  Neurological: Negative for dizziness, weakness and numbness.  Psychiatric/Behavioral: Negative for confusion.       Objective:   Physical Exam  Constitutional: He is oriented to person, place, and time. He appears well-developed and well-nourished. No distress.  HENT:  Head: Normocephalic.  Mouth/Throat: Oropharynx is clear and moist. No oropharyngeal exudate.  Eyes: Conjunctivae are normal. No scleral icterus.  Neck: Normal range of motion. Neck supple. No JVD present. No thyromegaly present.  Cardiovascular: Normal rate, regular rhythm and intact distal pulses.  Exam reveals no gallop and no friction rub.   No murmur heard. Pulmonary/Chest: Effort normal.  Musculoskeletal: Normal range of motion.  Lymphadenopathy:    He has no cervical adenopathy.  Neurological: He is alert and oriented to person, place, and time.  Skin: Skin is warm and dry. He is not diaphoretic.  Psychiatric: He has a normal mood and affect. His behavior is normal. Judgment and  thought content normal.  Nursing note and vitals reviewed.         Assessment & Plan:    1. Type 2 diabetes mellitus with diabetic chronic kidney disease -diet discussed and counseling given -exercise of at least 30 min discussed daily - sitaGLIPtin (JANUVIA) 100 MG tablet; Take 1 tablet (100 mg total) by mouth daily.  Dispense: 90 tablet; Refill: 1 - metFORMIN (GLUCOPHAGE-XR) 500 MG 24 hr tablet; Take 2 tablets (1,000 mg total) by mouth 2 (two) times daily.  Dispense: 360 tablet; Refill: 3  2. Type 2 diabetes mellitus with hyperglycemia  - glucose blood test strip; Use as instructed  Dispense: 100 each; Refill: 6  3. Screening for rectal cancer  - Ambulatory referral to Gastroenterology

## 2015-07-01 NOTE — Patient Instructions (Addendum)
Please start taking the metformin and the sitagliptin or the [place] together every day.  Try to change your bacon to [place] bacon or sausage.    Please continue to eat plenty of fruits and veggies.    Please make sure that your meat portions are less than 6 oz.    Call me if your blood sugars remain consistently high after 3 weeks of taking both medications.

## 2015-07-27 ENCOUNTER — Ambulatory Visit (HOSPITAL_BASED_OUTPATIENT_CLINIC_OR_DEPARTMENT_OTHER): Payer: Medicare Other | Admitting: Oncology

## 2015-07-27 ENCOUNTER — Other Ambulatory Visit (HOSPITAL_BASED_OUTPATIENT_CLINIC_OR_DEPARTMENT_OTHER): Payer: Medicare Other

## 2015-07-27 ENCOUNTER — Telehealth: Payer: Self-pay | Admitting: Oncology

## 2015-07-27 DIAGNOSIS — E119 Type 2 diabetes mellitus without complications: Secondary | ICD-10-CM | POA: Diagnosis not present

## 2015-07-27 DIAGNOSIS — I10 Essential (primary) hypertension: Secondary | ICD-10-CM

## 2015-07-27 DIAGNOSIS — Z87891 Personal history of nicotine dependence: Secondary | ICD-10-CM | POA: Diagnosis not present

## 2015-07-27 LAB — FERRITIN CHCC: Ferritin: 30 ng/ml (ref 22–316)

## 2015-07-27 NOTE — Progress Notes (Signed)
  [place] OFFICE PROGRESS NOTE   Diagnosis: Hemochromatosis  INTERVAL HISTORY:   He returns as scheduled. He feels well. No complaint. The ferritin was elevated in July. He completed 4 phlebotomy treatments with the last one on 06/25/2015.  Objective:  Vital signs in last 24 hours:  Blood pressure 154/81, pulse 76, temperature 98.5 F (36.9 C), temperature source Oral, resp. rate 18, height 5' 4.75" (1.645 m), weight 200 lb (90.719 kg), SpO2 93 %.   Resp: Lungs clear bilaterally Cardio: Distant heart sounds, regular rhythm GI: No hepatomegaly, no apparent ascites Vascular: No leg edema   Lab Results:  Lab Results  Component Value Date   WBC 5.1 05/15/2015   HGB 13.8 05/15/2015   HCT 39.6 05/15/2015   MCV 91.2 05/15/2015   PLT 172 05/15/2015   NEUTROABS 3.5 05/15/2015     Medications: I have reviewed the patient's current medications.  Assessment/Plan: 1. 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 He was last treated with phlebotomy therapy on 06/25/2015 2. History of tobacco use in the remote past. 3. Hypertension. 4. Hypercholesterolemia. 5. Diabetes. 6. Gout. 7. Sleep apnea. 8. Family history of hemochromatosis.    Disposition: Mr. Spratlin appears stable. We will follow-up on the ferritin level from today. We will check a ferritin level in 4 months and 8 months. He will be scheduled for a one-year office visit. We will initiate phlebotomy therapy as indicated.  Betsy Coder, MD  07/27/2015  10:12 AM

## 2015-07-27 NOTE — Telephone Encounter (Signed)
s.w. pt and advised on 2017 appts.Marland KitchenMarland Kitchenpt wanted me to mail...done...i mailed feb June and OCT to pt per request

## 2015-07-28 ENCOUNTER — Telehealth: Payer: Self-pay | Admitting: *Deleted

## 2015-07-28 NOTE — Telephone Encounter (Signed)
-----   Message from Brett Pier, MD sent at 07/27/2015  6:03 PM EDT ----- Please call patient, ferritin looks good, f/u as scheduled

## 2015-07-28 NOTE — Telephone Encounter (Signed)
Per Dr. Benay Spice; left voice message that ferritin looks good, f/u as scheduled; call office if questions

## 2015-08-12 DIAGNOSIS — Z86010 Personal history of colonic polyps: Secondary | ICD-10-CM | POA: Diagnosis not present

## 2015-08-25 DIAGNOSIS — K641 Second degree hemorrhoids: Secondary | ICD-10-CM | POA: Diagnosis not present

## 2015-08-25 DIAGNOSIS — Z1211 Encounter for screening for malignant neoplasm of colon: Secondary | ICD-10-CM | POA: Diagnosis not present

## 2015-08-25 DIAGNOSIS — K621 Rectal polyp: Secondary | ICD-10-CM | POA: Diagnosis not present

## 2015-08-25 DIAGNOSIS — D120 Benign neoplasm of cecum: Secondary | ICD-10-CM | POA: Diagnosis not present

## 2015-08-25 DIAGNOSIS — Z86010 Personal history of colonic polyps: Secondary | ICD-10-CM | POA: Diagnosis not present

## 2015-08-25 DIAGNOSIS — D128 Benign neoplasm of rectum: Secondary | ICD-10-CM | POA: Diagnosis not present

## 2015-08-25 DIAGNOSIS — K5140 Inflammatory polyps of colon without complications: Secondary | ICD-10-CM | POA: Diagnosis not present

## 2015-08-25 DIAGNOSIS — D126 Benign neoplasm of colon, unspecified: Secondary | ICD-10-CM | POA: Diagnosis not present

## 2015-08-25 DIAGNOSIS — D124 Benign neoplasm of descending colon: Secondary | ICD-10-CM | POA: Diagnosis not present

## 2015-09-17 DIAGNOSIS — E669 Obesity, unspecified: Secondary | ICD-10-CM | POA: Insufficient documentation

## 2015-09-17 NOTE — Patient Instructions (Signed)

## 2015-09-17 NOTE — Progress Notes (Signed)
Patient ID: Brett [name], male   DOB: January 02, 1946, 69 y.o.   MRN: [id number]  Medicare Annual Wellness Visit and  Comprehensive Evaluation & Examination    Assessment:   1. Essential hypertension  - EKG 12-Lead - [place], RETROPERITNL ABD,  LTD - TSH  2. Hyperlipidemia  - Lipid panel - TSH  3. Type 2 diabetes mellitus with diabetic nephropathy, without long-term current use of insulin (HCC)  - Microalbumin / creatinine urine ratio - HM DIABETES FOOT EXAM - LOW EXTREMITY NEUR EXAM DOCUM - Hemoglobin A1c - Insulin, random  4. Vitamin D deficiency  - VITAMIN D 25 Hydroxyl  5. Idiopathic gout  - Uric acid  6. Hemochromatosis   7. Dilated cardiomyopathy ([place])   8. OSA (obstructive sleep apnea)   9. BMI 33.0-33.9,adult   10. Prostate cancer screening  - PSA  11. Screening for rectal cancer  - POC Hemoccult Bld/Stl   12. Depression screen   13. At low risk for fall   14. Medication management  - Urinalysis, Routine w reflex microscopic  - CBC with Differential/Platelet - BASIC METABOLIC PANEL WITH GFR - Hepatic function panel - Magnesium  15. Bladder neck obstruction  - PSA  16. Encounter for Medicare annual wellness exam   17. Encounter for general adult medical examination with abnormal findings   18. Type 2 diabetes mellitus with chronic kidney disease, without long-term current use of insulin, unspecified CKD stage ([place])   Plan:   During the course of the visit the patient was educated and counseled about appropriate screening and preventive services including:    Pneumococcal vaccine   Influenza vaccine  Td vaccine  Screening electrocardiogram  Bone densitometry screening  Colorectal cancer screening  Diabetes screening  Glaucoma screening  Nutrition counseling   Advanced directives: requested  Screening recommendations, referrals: Vaccinations: Immunization History  Administered Date(s) Administered  . DT  01/21/2014  . Influenza Split 09/11/2013  . Influenza,inj,Quad PF,36+ Mos 07/28/2014  . Influenza-Unspecified 08/21/2012  . Pneumococcal Conjugate-13 09/11/2014  . Pneumococcal-Unspecified 08/21/2012  Shingles vaccine declined Hep B vaccine not indicated  Nutrition assessed and recommended  Colonoscopy 08/12/2015 Recommended yearly ophthalmology/optometry visit for glaucoma screening and checkup Recommended yearly dental visit for hygiene and checkup Advanced directives - Yes  Conditions/risks identified: BMI: Discussed weight loss, diet, and increase physical activity.  Increase physical activity: AHA recommends 150 minutes of physical activity a week.  Medications reviewed Diabetes is not at goal, ACE/ARB therapy: Yes. Urinary Incontinence is not an issue: discussed non pharmacology and pharmacology options.  Fall risk: low- discussed PT, home fall assessment, medications.   Subjective:       Brett [name]   presents for Medicare Annual Wellness Visit and date of last medicare wellness visit was 12/14/2013. This very nice 69 y.o. MWM  Also presents for presents for a comprehensive evaluation and management of multiple medical co-morbidities.  Patient has been followed for HTN, T2_NIDDM/CKD, Gout, Hemochromatosis,  Hyperlipidemia and Vitamin D Deficiency. 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      HTN predates circa 1970. Patient's BP has been controlled at home.Today's  BP is 158/92 and dropped to 144/84. Heart cath in 2006 was negative for CAD, but he was dx'd with a non-ischemic dilated cardiomyopathy. Patient denies any cardiac symptoms as chest pain, palpitations, shortness of breath, dizziness or ankle swelling.      Patient's hyperlipidemia is controlled with diet and medications. Patient denies myalgias or other  medication SE's. Last lipids were at goal with Cholesterol 125; HDL 43; LDL 52; Triglycerides  148 on 05/15/2015. Gout is controlled on Allopurinol.       Patient has T2_NIDDM w/CKD 2 since 2006 and patient denies reactive hypoglycemic symptoms, visual blurring, diabetic polys or paresthesias. Dietary compliance has been sub-optimal. And last A1c was not at goal with 8.8% on 05/15/2015.        Finally, patient has history of Vitamin D Deficiency and last vitamin D was 67 on 05/15/2015.  Names of Other Physician/Practitioners you currently use: 1. [facility] here for primary care 2. Dr Zigmund Daniel , eye doctor, last visit - Dec 2015 3. ?dentist's nane , dentist, last visit 2015  Patient Care Team: [name], MD as PCP - General (Internal Medicine) [name], MD as Consulting Physician (Oncology) Lelon Perla, MD as Consulting Physician (Cardiology) [name], MD as Consulting Physician (Ophthalmology)  Medication Review: Medication Sig  . allopurinol (ZYLOPRIM) 300 MG tablet Take 1 tablet by mouth  daily  . aspirin 81 MG tablet Take 81 mg by mouth daily.    Marland Kitchen atorvastatin (LIPITOR) 20 MG tablet Take 1 tablet by mouth  daily  . bisoprolol-hydrochlorothiazide (ZIAC) 5-6.25 MG per tablet Take 1 tablet by mouth  daily  . VITAMIN D 1000 UNITS capsule Take 5,000 Units by mouth daily.   . furosemide  20 MG tablet TAKE 1 TABLET BY MOUTH  EVERY DAY FOR BLOOD  PRESSURE AND FLUID  . Loratadine 10 mg Take by mouth daily.    Marland Kitchen losartan  100 MG tablet Take 1 tablet by mouth  daily  . magnesium oxide 400 MG tablet Take 400 mg by mouth daily.  . metFORMIN -XR) 500 MG 24 hr  Take 2 tablets (1,000 mg total) by mouth 2 (two) times daily.  . minoxidil  10 MG tablet Take 1 tablet by mouth  daily  . ranitidine  300 MG tablet Take 1 tablet by mouth at  bedtime  . sitaGLIPtin  100 MG tablet Take 1 tablet (100 mg total) by mouth daily.   No Known Allergies  Current Problems (verified) Patient Active Problem List   Diagnosis Date Noted  .  Encounter for Medicare annual wellness exam 09/17/2015  . BMI 33.0-33.9,adult 09/17/2015  . Hyperlipidemia 01/21/2014  . Vitamin D deficiency 01/21/2014  . Medication management 01/21/2014  . Type II diabetes mellitus with renal manifestations ([place])   . Hypertension   . OSA (obstructive sleep apnea)   . Gout   . Dilated cardiomyopathy ([place])   . Hemochromatosis 09/27/2011   Screening Tests Health Maintenance  Topic Date Due  . Hepatitis C Screening  Aug 25, 1946  . TETANUS/TDAP  07/22/1965  . ZOSTAVAX  07/22/2006  . OPHTHALMOLOGY EXAM  10/11/2014  . COLONOSCOPY  10/24/2014  . INFLUENZA VACCINE  05/25/2015  . URINE MICROALBUMIN  09/12/2015  . HEMOGLOBIN A1C  11/15/2015  . FOOT EXAM  01/13/2016  . PNA vac Low Risk Adult  Completed   Immunization History  Administered Date(s) Administered  . DT 01/21/2014  . Influenza Split 09/11/2013  . Influenza,inj,Quad PF,36+ Mos 07/28/2014  . Influenza-Unspecified 08/21/2012  . Pneumococcal Conjugate-13 09/11/2014  . Pneumococcal-Unspecified 08/21/2012   Preventative care: Last colonoscopy: 08/12/2015  Past Medical History  Diagnosis Date  . Diabetes mellitus without complication ([place])   . OSA (obstructive sleep apnea)   . Allergy   . Gout   . Hemochromatosis   . Dilated cardiomyopathy (  [place])     non isch  . Hypertension   . Colon polyp    Past Surgical History  Procedure Laterality Date  . Eye surgery Bilateral     Cataract   Risk Factors: Tobacco Social History  Substance Use Topics  . Smoking status: Former Smoker    Quit date: 11/24/1966  . Smokeless tobacco: Never Used  . Alcohol Use: Yes     Comment: Occasionally   He does not smoke.  Patient is a former smoker. Are there smokers in your home (other than you)?  No  Alcohol Current alcohol use: social drinker  Caffeine Current caffeine use: coffee 3 cups /day  Exercise Current exercise: walking and yard work  Nutrition/Diet Current diet: in general, a  "healthy" diet    Cardiac risk factors: advanced age (older than 7 for men, 79 for women), diabetes mellitus, dyslipidemia, hypertension, microalbuminuria, obesity (BMI >= 30 kg/m2), sedentary lifestyle and smoking/ tobacco exposure.  Depression Screen (Note: if answer to either of the following is "Yes", a more complete depression screening is indicated)   Q1: Over the past two weeks, have you felt down, depressed or hopeless? No  Q2: Over the past two weeks, have you felt little interest or pleasure in doing things? No  Have you lost interest or pleasure in daily life? No  Do you often feel hopeless? No  Do you cry easily over simple problems? No  Activities of Daily Living In your present state of health, do you have any difficulty performing the following activities?:  Driving? No Managing money?  No Feeding yourself? No Getting from bed to chair? No Climbing a flight of stairs? No Preparing food and eating?: No Bathing or showering? No Getting dressed: No Getting to the toilet? No Using the toilet:No Moving around from place to place: No In the past year have you fallen or had a near fall?:No   Are you sexually active?  Yes  Do you have more than one partner?  No  Vision Difficulties: No  Hearing Difficulties: No Do you often ask people to speak up or repeat themselves? No Do you experience ringing or noises in your ears? No Do you have difficulty understanding soft or whispered voices? No  Cognition  Do you feel that you have a problem with memory?No  Do you often misplace items? No  Do you feel safe at home?  Yes  Advanced directives Does patient have a [place]? Yes Does patient have a Living Will? Yes  ROS: Constitutional: Denies fever, chills, weight loss/gain, headaches, insomnia, fatigue, night sweats or change in appetite. Eyes: Denies redness, blurred vision, diplopia, discharge, itchy or watery eyes.  ENT: Denies discharge,  congestion, post nasal drip, epistaxis, sore throat, earache, hearing loss, dental pain, Tinnitus, Vertigo, Sinus pain or snoring.  Cardio: Denies chest pain, palpitations, irregular heartbeat, syncope, dyspnea, diaphoresis, orthopnea, PND, claudication or edema Respiratory: denies cough, dyspnea, DOE, pleurisy, hoarseness, laryngitis or wheezing.  Gastrointestinal: Denies dysphagia, heartburn, reflux, water brash, pain, cramps, nausea, vomiting, bloating, diarrhea, constipation, hematemesis, melena, hematochezia, jaundice or hemorrhoids Genitourinary: Denies dysuria, frequency, urgency, nocturia, hesitancy, discharge, hematuria or flank pain Musculoskeletal: Denies arthralgia, myalgia, stiffness, Jt. Swelling, pain, limp or strain/sprain. Denies Falls. Skin: Denies puritis, rash, hives, warts, acne, eczema or change in skin lesion Neuro: No weakness, tremor, incoordination, spasms, paresthesia or pain Psychiatric: Denies confusion, memory loss or sensory loss. Denies Depression. Endocrine: Denies change in weight, skin, hair change, nocturia, and paresthesia, diabetic  polys, visual blurring or hyper / hypo glycemic episodes.  Heme/Lymph: No excessive bleeding, bruising or enlarged lymph nodes.  Objective:     BP 158/92 - rechecked at 144/84  Pulse 70  Temp 98.1 F   Resp 18  Ht 5' 4.75"   Wt 197 lb 9.6 oz   BMI 33.12   General Appearance:  Alert  WD/WN, male  in no apparent distress. Eyes: PERRLA, EOMs nl, conjunctiva normal, normal fundi and vessels. Sinuses: No frontal/maxillary tenderness ENT/Mouth: EACs patent / TMs  nl. Nares clear without erythema, swelling, mucoid exudates. Oral hygiene is good. No erythema, swelling, or exudate. Tongue normal, non-obstructing. Tonsils not swollen or erythematous. Hearing normal.  Neck: Supple, thyroid normal. No bruits, nodes or JVD. Respiratory: Respiratory effort normal.  BS equal and clear bilateral without rales, rhonci, wheezing or  stridor. Cardio: Heart sounds are normal with regular rate and rhythm and no murmurs, rubs or gallops. Peripheral pulses are normal and equal bilaterally without edema. No aortic or femoral bruits. Chest: symmetric with normal excursions and percussion.  Abdomen: Flat, soft with nl bowel sounds. Nontender, no guarding, rebound, hernias, masses, or organomegaly.  Lymphatics: Non tender without lymphadenopathy.  Genitourinary: No hernias.Testes nl. DRE - prostate nl for age - smooth & firm w/o nodules. Musculoskeletal: Full ROM all peripheral extremities, joint stability, 5/5 strength and normal gait. Skin: Warm and dry without rashes, lesions, cyanosis, clubbing or  ecchymosis.  Neuro: Cranial nerves intact, reflexes equal bilaterally. Normal muscle tone, no cerebellar symptoms. Sensation intact by touch, vibratory and monofilament testing bilaterally to the toes.  Pysch: Alert and oriented X 3 with normal affect, insight and judgment appropriate.   Cognitive Testing  Alert? Yes  Normal Appearance? Yes  Oriented to person? Yes  Place? Yes   Time? Yes  Recall of three objects?  Yes  Can perform simple calculations? Yes  Displays appropriate judgment? Yes  Can read the correct time from a watch/clock? Yes  Medicare Attestation I have personally reviewed: The patient's medical and social history Their use of alcohol, tobacco or illicit drugs Their current medications and supplements The patient's functional ability including ADLs,fall risks, home safety risks, cognitive, and hearing and visual impairment Diet and physical activities Evidence for depression or mood disorders  The patient's weight, height, BMI, and visual acuity have been recorded in the chart.  I have made referrals, counseling, and provided education to the patient based on review of the above and I have provided the patient with a written personalized care plan for preventive services.  Over 40 minutes of exam, counseling,  chart review was performed.  Howard Bunte DAVID, MD   09/21/2015

## 2015-09-21 ENCOUNTER — Encounter: Payer: Self-pay | Admitting: Internal Medicine

## 2015-09-21 ENCOUNTER — Ambulatory Visit (INDEPENDENT_AMBULATORY_CARE_PROVIDER_SITE_OTHER): Payer: Medicare Other | Admitting: Internal Medicine

## 2015-09-21 VITALS — BP 158/92 | HR 70 | Temp 98.1°F | Resp 18 | Ht 64.75 in | Wt 197.6 lb

## 2015-09-21 DIAGNOSIS — I10 Essential (primary) hypertension: Secondary | ICD-10-CM | POA: Diagnosis not present

## 2015-09-21 DIAGNOSIS — N320 Bladder-neck obstruction: Secondary | ICD-10-CM

## 2015-09-21 DIAGNOSIS — Z789 Other specified health status: Secondary | ICD-10-CM

## 2015-09-21 DIAGNOSIS — Z1389 Encounter for screening for other disorder: Secondary | ICD-10-CM | POA: Diagnosis not present

## 2015-09-21 DIAGNOSIS — Z0000 Encounter for general adult medical examination without abnormal findings: Secondary | ICD-10-CM

## 2015-09-21 DIAGNOSIS — E559 Vitamin D deficiency, unspecified: Secondary | ICD-10-CM

## 2015-09-21 DIAGNOSIS — E782 Mixed hyperlipidemia: Secondary | ICD-10-CM | POA: Diagnosis not present

## 2015-09-21 DIAGNOSIS — Z9181 History of falling: Secondary | ICD-10-CM

## 2015-09-21 DIAGNOSIS — E1121 Type 2 diabetes mellitus with diabetic nephropathy: Secondary | ICD-10-CM | POA: Diagnosis not present

## 2015-09-21 DIAGNOSIS — Z1331 Encounter for screening for depression: Secondary | ICD-10-CM

## 2015-09-21 DIAGNOSIS — Z79899 Other long term (current) drug therapy: Secondary | ICD-10-CM | POA: Diagnosis not present

## 2015-09-21 DIAGNOSIS — E1122 Type 2 diabetes mellitus with diabetic chronic kidney disease: Secondary | ICD-10-CM | POA: Diagnosis not present

## 2015-09-21 DIAGNOSIS — Z1212 Encounter for screening for malignant neoplasm of rectum: Secondary | ICD-10-CM

## 2015-09-21 DIAGNOSIS — Z6833 Body mass index (BMI) 33.0-33.9, adult: Secondary | ICD-10-CM

## 2015-09-21 DIAGNOSIS — Z0001 Encounter for general adult medical examination with abnormal findings: Secondary | ICD-10-CM

## 2015-09-21 DIAGNOSIS — M1000 Idiopathic gout, unspecified site: Secondary | ICD-10-CM

## 2015-09-21 DIAGNOSIS — I420 Dilated cardiomyopathy: Secondary | ICD-10-CM | POA: Diagnosis not present

## 2015-09-21 DIAGNOSIS — R6889 Other general symptoms and signs: Secondary | ICD-10-CM

## 2015-09-21 DIAGNOSIS — E1129 Type 2 diabetes mellitus with other diabetic kidney complication: Secondary | ICD-10-CM | POA: Diagnosis not present

## 2015-09-21 DIAGNOSIS — G4733 Obstructive sleep apnea (adult) (pediatric): Secondary | ICD-10-CM | POA: Diagnosis not present

## 2015-09-21 DIAGNOSIS — Z125 Encounter for screening for malignant neoplasm of prostate: Secondary | ICD-10-CM

## 2015-09-21 DIAGNOSIS — M109 Gout, unspecified: Secondary | ICD-10-CM | POA: Diagnosis not present

## 2015-09-21 LAB — CBC WITH DIFFERENTIAL/PLATELET
Basophils Absolute: 0 10*3/uL (ref 0.0–0.1)
Basophils Relative: 0 % (ref 0–1)
EOS ABS: 0.2 10*3/uL (ref 0.0–0.7)
EOS PCT: 3 % (ref 0–5)
HCT: 42 % (ref 39.0–52.0)
Hemoglobin: 14.8 g/dL (ref 13.0–17.0)
LYMPHS ABS: 1.6 10*3/uL (ref 0.7–4.0)
Lymphocytes Relative: 31 % (ref 12–46)
MCH: 30.6 pg (ref 26.0–34.0)
MCHC: 35.2 g/dL (ref 30.0–36.0)
MCV: 87 fL (ref 78.0–100.0)
MONOS PCT: 10 % (ref 3–12)
MPV: 9.5 fL (ref 8.6–12.4)
Monocytes Absolute: 0.5 10*3/uL (ref 0.1–1.0)
Neutro Abs: 2.8 10*3/uL (ref 1.7–7.7)
Neutrophils Relative %: 56 % (ref 43–77)
PLATELETS: 168 10*3/uL (ref 150–400)
RBC: 4.83 MIL/uL (ref 4.22–5.81)
RDW: 14 % (ref 11.5–15.5)
WBC: 5 10*3/uL (ref 4.0–10.5)

## 2015-09-21 LAB — LIPID PANEL
Cholesterol: 105 mg/dL — ABNORMAL LOW (ref 125–200)
HDL: 37 mg/dL — AB (ref >=40)
LDL Cholesterol: 47 mg/dL (ref <130)
TRIGLYCERIDES: 104 mg/dL (ref <150)
Total CHOL/HDL Ratio: 2.8 Ratio (ref <=5.0)
VLDL: 21 mg/dL (ref <30)

## 2015-09-21 LAB — HEPATIC FUNCTION PANEL
ALBUMIN: 4.6 g/dL (ref 3.6–5.1)
ALT: 18 U/L (ref 9–46)
AST: 15 U/L (ref 10–35)
Alkaline Phosphatase: 35 U/L — ABNORMAL LOW (ref 40–115)
BILIRUBIN TOTAL: 0.8 mg/dL (ref 0.2–1.2)
Bilirubin, Direct: 0.2 mg/dL (ref <=0.2)
Indirect Bilirubin: 0.6 mg/dL (ref 0.2–1.2)
Total Protein: 6.7 g/dL (ref 6.1–8.1)

## 2015-09-21 LAB — BASIC METABOLIC PANEL WITH GFR
BUN: 16 mg/dL (ref 7–25)
CO2: 30 mmol/L (ref 20–31)
CREATININE: 0.94 mg/dL (ref 0.70–1.25)
Calcium: 9.3 mg/dL (ref 8.6–10.3)
Chloride: 97 mmol/L — ABNORMAL LOW (ref 98–110)
GFR, Est Non African American: 82 mL/min (ref >=60)
Glucose, Bld: 272 mg/dL — ABNORMAL HIGH (ref 65–99)
Potassium: 3.6 mmol/L (ref 3.5–5.3)
Sodium: 136 mmol/L (ref 135–146)

## 2015-09-21 LAB — URIC ACID: URIC ACID, SERUM: 4.2 mg/dL (ref 4.0–7.8)

## 2015-09-21 LAB — MAGNESIUM: MAGNESIUM: 1.9 mg/dL (ref 1.5–2.5)

## 2015-09-22 LAB — URINALYSIS, MICROSCOPIC ONLY
BACTERIA UA: NONE SEEN [HPF]
CRYSTALS: NONE SEEN [HPF]
Casts: NONE SEEN [LPF]
RBC / HPF: NONE SEEN RBC/HPF (ref <=2)
SQUAMOUS EPITHELIAL / LPF: NONE SEEN [HPF] (ref <=5)
WBC UA: NONE SEEN WBC/HPF (ref <=5)
Yeast: NONE SEEN [HPF]

## 2015-09-22 LAB — MICROALBUMIN / CREATININE URINE RATIO
Creatinine, Urine: 55 mg/dL (ref 20–370)
Microalb Creat Ratio: 4 mcg/mg creat (ref <30)
Microalb, Ur: 0.2 mg/dL

## 2015-09-22 LAB — PSA: PSA: 0.51 ng/mL (ref <=4.00)

## 2015-09-22 LAB — URINALYSIS, ROUTINE W REFLEX MICROSCOPIC
Bilirubin Urine: NEGATIVE
HGB URINE DIPSTICK: NEGATIVE
Ketones, ur: NEGATIVE
LEUKOCYTES UA: NEGATIVE
NITRITE: NEGATIVE
PH: 5 (ref 5.0–8.0)
Protein, ur: NEGATIVE
SPECIFIC GRAVITY, URINE: 1.018 (ref 1.001–1.035)

## 2015-09-22 LAB — VITAMIN D 25 HYDROXY (VIT D DEFICIENCY, FRACTURES): VIT D 25 HYDROXY: 67 ng/mL (ref 30–100)

## 2015-09-22 LAB — TSH: TSH: 3.899 u[IU]/mL (ref 0.350–4.500)

## 2015-09-22 LAB — HEMOGLOBIN A1C
Hgb A1c MFr Bld: 9.7 % — ABNORMAL HIGH (ref <5.7)
MEAN PLASMA GLUCOSE: 232 mg/dL — AB (ref <117)

## 2015-09-22 LAB — INSULIN, RANDOM: INSULIN: 7.6 u[IU]/mL (ref 2.0–19.6)

## 2015-09-28 ENCOUNTER — Other Ambulatory Visit: Payer: Self-pay | Admitting: Internal Medicine

## 2015-09-30 DIAGNOSIS — K641 Second degree hemorrhoids: Secondary | ICD-10-CM | POA: Diagnosis not present

## 2015-10-12 ENCOUNTER — Ambulatory Visit (INDEPENDENT_AMBULATORY_CARE_PROVIDER_SITE_OTHER): Payer: Medicare Other | Admitting: Ophthalmology

## 2015-10-14 DIAGNOSIS — Z23 Encounter for immunization: Secondary | ICD-10-CM | POA: Diagnosis not present

## 2015-10-14 DIAGNOSIS — K641 Second degree hemorrhoids: Secondary | ICD-10-CM | POA: Diagnosis not present

## 2015-10-22 ENCOUNTER — Other Ambulatory Visit: Payer: Self-pay | Admitting: *Deleted

## 2015-10-22 MED ORDER — SITAGLIPTIN PHOSPHATE 100 MG PO TABS: 100.0000 mg | ORAL_TABLET | Freq: Every day | ORAL | Status: DC

## 2015-10-28 DIAGNOSIS — K641 Second degree hemorrhoids: Secondary | ICD-10-CM | POA: Diagnosis not present

## 2015-10-28 DIAGNOSIS — Z2089 Contact with and (suspected) exposure to other communicable diseases: Secondary | ICD-10-CM | POA: Diagnosis not present

## 2015-11-24 ENCOUNTER — Other Ambulatory Visit: Payer: Self-pay | Admitting: *Deleted

## 2015-11-24 MED ORDER — SITAGLIPTIN PHOSPHATE 100 MG PO TABS: 100.0000 mg | ORAL_TABLET | Freq: Every day | ORAL | Status: DC

## 2015-11-25 ENCOUNTER — Other Ambulatory Visit (HOSPITAL_BASED_OUTPATIENT_CLINIC_OR_DEPARTMENT_OTHER): Payer: Medicare Other

## 2015-11-25 LAB — FERRITIN: FERRITIN: 70 ng/mL (ref 22–316)

## 2015-11-27 ENCOUNTER — Telehealth: Payer: Self-pay | Admitting: *Deleted

## 2015-11-27 NOTE — Telephone Encounter (Signed)
Called pt with ferritin result. Per Dr. Benay Spice: Will plan for phlebotomy if ferritin is greater than 100. Pt voiced understanding.

## 2015-11-27 NOTE — Telephone Encounter (Signed)
-----   Message from [name], MD sent at 11/25/2015  7:30 PM EST ----- Please call patient, ferritin is ok, f/u as scheudled in June, plan for phlebotomy if ferritin is over 100

## 2015-12-11 ENCOUNTER — Encounter: Payer: Self-pay | Admitting: *Deleted

## 2015-12-25 ENCOUNTER — Encounter: Payer: Self-pay | Admitting: Internal Medicine

## 2015-12-25 ENCOUNTER — Ambulatory Visit (INDEPENDENT_AMBULATORY_CARE_PROVIDER_SITE_OTHER): Payer: Medicare Other | Admitting: Internal Medicine

## 2015-12-25 VITALS — BP 140/82 | HR 78 | Temp 98.4°F | Resp 18 | Ht 64.75 in | Wt 196.0 lb

## 2015-12-25 DIAGNOSIS — E782 Mixed hyperlipidemia: Secondary | ICD-10-CM | POA: Diagnosis not present

## 2015-12-25 DIAGNOSIS — E559 Vitamin D deficiency, unspecified: Secondary | ICD-10-CM

## 2015-12-25 DIAGNOSIS — E1129 Type 2 diabetes mellitus with other diabetic kidney complication: Secondary | ICD-10-CM | POA: Diagnosis not present

## 2015-12-25 DIAGNOSIS — E1122 Type 2 diabetes mellitus with diabetic chronic kidney disease: Secondary | ICD-10-CM

## 2015-12-25 DIAGNOSIS — N189 Chronic kidney disease, unspecified: Secondary | ICD-10-CM | POA: Diagnosis not present

## 2015-12-25 DIAGNOSIS — I10 Essential (primary) hypertension: Secondary | ICD-10-CM | POA: Diagnosis not present

## 2015-12-25 DIAGNOSIS — J069 Acute upper respiratory infection, unspecified: Secondary | ICD-10-CM

## 2015-12-25 DIAGNOSIS — Z79899 Other long term (current) drug therapy: Secondary | ICD-10-CM

## 2015-12-25 LAB — HEPATIC FUNCTION PANEL
ALBUMIN: 4.4 g/dL (ref 3.6–5.1)
ALK PHOS: 38 U/L — AB (ref 40–115)
ALT: 13 U/L (ref 9–46)
AST: 12 U/L (ref 10–35)
Bilirubin, Direct: 0.2 mg/dL (ref <=0.2)
Indirect Bilirubin: 0.6 mg/dL (ref 0.2–1.2)
TOTAL PROTEIN: 6.6 g/dL (ref 6.1–8.1)
Total Bilirubin: 0.8 mg/dL (ref 0.2–1.2)

## 2015-12-25 LAB — CBC WITH DIFFERENTIAL/PLATELET
BASOS ABS: 0 10*3/uL (ref 0.0–0.1)
Basophils Relative: 0 % (ref 0–1)
Eosinophils Absolute: 0.1 10*3/uL (ref 0.0–0.7)
Eosinophils Relative: 2 % (ref 0–5)
HEMATOCRIT: 39.1 % (ref 39.0–52.0)
HEMOGLOBIN: 14.5 g/dL (ref 13.0–17.0)
LYMPHS PCT: 14 % (ref 12–46)
Lymphs Abs: 0.9 10*3/uL (ref 0.7–4.0)
MCH: 33 pg (ref 26.0–34.0)
MCHC: 36.7 g/dL — ABNORMAL HIGH (ref 30.0–36.0)
MCV: 88.9 fL (ref 78.0–100.0)
MONO ABS: 0.7 10*3/uL (ref 0.1–1.0)
MPV: 9.2 fL (ref 8.6–12.4)
Monocytes Relative: 12 % (ref 3–12)
NEUTROS ABS: 4.4 10*3/uL (ref 1.7–7.7)
Neutrophils Relative %: 72 % (ref 43–77)
Platelets: 153 10*3/uL (ref 150–400)
RBC: 4.4 MIL/uL (ref 4.22–5.81)
RDW: 13.2 % (ref 11.5–15.5)
WBC: 6.1 10*3/uL (ref 4.0–10.5)

## 2015-12-25 LAB — BASIC METABOLIC PANEL WITH GFR
BUN: 15 mg/dL (ref 7–25)
CO2: 27 mmol/L (ref 20–31)
Calcium: 9.6 mg/dL (ref 8.6–10.3)
Chloride: 101 mmol/L (ref 98–110)
Creat: 1.03 mg/dL (ref 0.70–1.25)
GFR, EST NON AFRICAN AMERICAN: 74 mL/min (ref >=60)
GFR, Est African American: 85 mL/min (ref >=60)
GLUCOSE: 208 mg/dL — AB (ref 65–99)
POTASSIUM: 3.4 mmol/L — AB (ref 3.5–5.3)
Sodium: 139 mmol/L (ref 135–146)

## 2015-12-25 LAB — LIPID PANEL
Cholesterol: 99 mg/dL — ABNORMAL LOW (ref 125–200)
HDL: 46 mg/dL (ref >=40)
LDL Cholesterol: 41 mg/dL (ref <130)
TRIGLYCERIDES: 60 mg/dL (ref <150)
Total CHOL/HDL Ratio: 2.2 Ratio (ref <=5.0)
VLDL: 12 mg/dL (ref <30)

## 2015-12-25 LAB — TSH: TSH: 1.72 m[IU]/L (ref 0.40–4.50)

## 2015-12-25 MED ORDER — MONTELUKAST SODIUM 10 MG PO TABS: 10.0000 mg | ORAL_TABLET | Freq: Every day | ORAL | Status: DC

## 2015-12-25 MED ORDER — FLUTICASONE PROPIONATE 50 MCG/ACT NA SUSP: 2.0000 | Freq: Every day | NASAL | Status: DC

## 2015-12-25 MED ORDER — PROMETHAZINE-DM 6.25-15 MG/5ML PO SYRP: ORAL_SOLUTION | ORAL | Status: DC

## 2015-12-25 NOTE — Progress Notes (Signed)
Assessment and Plan:  Hypertension:  -Continue medication -monitor blood pressure at home. -Continue DASH diet -Reminder to go to the ER if any CP, SOB, nausea, dizziness, severe HA, changes vision/speech, left arm numbness and tingling and jaw pain.  Cholesterol - Continue diet and exercise -Check cholesterol.   Diabetes with diabetic chronic kidney disease -Continue diet and exercise.  -Check A1C  Vitamin D Def -check level -continue medications.   Allergic rhinitis -singulair -flonase -phenergan dm  Continue diet and meds as discussed. Further disposition pending results of labs. Discussed med's effects and SE's.    HPI 70 y.o. male  presents for 3 month follow up with hypertension, hyperlipidemia, diabetes and vitamin D deficiency.   His blood pressure has been controlled at home, today their BP is BP: 140/82 mmHg.He does workout. He denies chest pain, shortness of breath, dizziness.   He is on cholesterol medication and denies myalgias. His cholesterol is at goal. The cholesterol was:  09/21/2015: Cholesterol 105*; HDL 37*; LDL Cholesterol 47; Triglycerides 104   He has been working on diet and exercise for diabetes with diabetic chronic kidney disease, he is on bASA, he is on ACE/ARB, and denies  foot ulcerations, hyperglycemia, hypoglycemia , increased appetite, nausea, paresthesia of the feet, polydipsia, polyuria, visual disturbances, vomiting and weight loss. Last A1C was: 09/21/2015: Hgb A1c MFr Bld 9.7*   Patient is on Vitamin D supplement. 09/21/2015: Vit D, 25-Hydroxy 67  He does report that he has been having some running nose and some cough.  He reports that he has "garbage" coming from the inside.  He does have some eye itching and some watering of the eyes.    Current Medications:  Current Outpatient Prescriptions on File Prior to Visit  Medication Sig Dispense Refill  . allopurinol (ZYLOPRIM) 300 MG tablet Take 1 tablet by mouth  daily 90 tablet 1  .  aspirin 81 MG tablet Take 81 mg by mouth daily.      Marland Kitchen atorvastatin (LIPITOR) 20 MG tablet Take 1 tablet by mouth  daily 90 tablet 1  . bisoprolol-hydrochlorothiazide (ZIAC) 5-6.25 MG per tablet Take 1 tablet by mouth  daily 90 tablet 1  . Cholecalciferol (VITAMIN D) 1000 UNITS capsule Take 5,000 Units by mouth daily.     . furosemide (LASIX) 20 MG tablet TAKE 1 TABLET BY MOUTH  EVERY DAY FOR BLOOD  PRESSURE AND FLUID 90 tablet 1  . glipiZIDE (GLUCOTROL) 10 MG tablet Take 1 tablet by mouth  daily before breakfast 90 tablet 1  . glucose blood test strip Use as instructed 100 each 6  . Loratadine (CLARITIN PO) Take by mouth daily.      Marland Kitchen losartan (COZAAR) 100 MG tablet Take 1 tablet by mouth  daily 90 tablet 1  . magnesium oxide (MAG-OX) 400 MG tablet Take 400 mg by mouth daily.    . metFORMIN (GLUCOPHAGE-XR) 500 MG 24 hr tablet Take 2 tablets (1,000 mg total) by mouth 2 (two) times daily. 360 tablet 3  . minoxidil (LONITEN) 10 MG tablet Take 1 tablet by mouth  daily 90 tablet 1  . ranitidine (ZANTAC) 300 MG tablet Take 1 tablet by mouth at  bedtime 90 tablet 1  . sitaGLIPtin (JANUVIA) 100 MG tablet Take 1 tablet (100 mg total) by mouth daily. 90 tablet 1   No current facility-administered medications on file prior to visit.   Medical History:  Past Medical History  Diagnosis Date  . Diabetes mellitus without complication ([place])   .  OSA (obstructive sleep apnea)   . Allergy   . Gout   . Hemochromatosis   . Dilated cardiomyopathy ([place])     non isch  . Hypertension   . Colon polyp    Allergies: No Known Allergies   Review of Systems:  Review of Systems  Constitutional: Negative for fever, chills and malaise/fatigue.  HENT: Positive for congestion. Negative for ear pain and sore throat.   Respiratory: Positive for cough. Negative for shortness of breath and wheezing.   Cardiovascular: Negative for chest pain, palpitations and leg swelling.  Gastrointestinal: Negative for heartburn,  abdominal pain, diarrhea, constipation, blood in stool and melena.  Genitourinary: Negative.   Neurological: Negative for dizziness, sensory change, loss of consciousness and headaches.  Psychiatric/Behavioral: Negative for depression. The patient is not nervous/anxious and does not have insomnia.     Family history- Review and unchanged  Social history- Review and unchanged  Physical Exam: BP 140/82 mmHg  Pulse 78  Temp(Src) 98.4 F (36.9 C) (Temporal)  Resp 18  Ht 5' 4.75" (1.645 m)  Wt 196 lb (88.905 kg)  BMI 32.85 kg/m2 Wt Readings from Last 3 Encounters:  12/25/15 196 lb (88.905 kg)  09/21/15 197 lb 9.6 oz (89.631 kg)  07/27/15 200 lb (90.719 kg)   General Appearance: Well nourished well developed, non-toxic appearing, in no apparent distress. Eyes: PERRLA, EOMs, conjunctiva no swelling or erythema ENT/Mouth: Ear canals clear with no erythema, swelling, or discharge.  TMs normal bilaterally, oropharynx clear, moist, with no exudate.   Neck: Supple, thyroid normal, no JVD, no cervical adenopathy.  Respiratory: Respiratory effort normal, breath sounds clear A&P, no wheeze, rhonchi or rales noted.  No retractions, no accessory muscle usage Cardio: RRR with no MRGs. No noted edema.  Abdomen: Soft, + BS.  Non tender, no guarding, rebound, hernias, masses. Musculoskeletal: Full ROM, 5/5 strength, Normal gait Skin: Warm, dry without rashes, lesions, ecchymosis.  Neuro: Awake and oriented X 3, Cranial nerves intact. No cerebellar symptoms.  Psych: normal affect, Insight and Judgment appropriate.    Starlyn Skeans, PA-C 8:57 AM [facility]

## 2015-12-26 LAB — HEMOGLOBIN A1C
HEMOGLOBIN A1C: 7.5 % — AB (ref <5.7)
Mean Plasma Glucose: 169 mg/dL — ABNORMAL HIGH (ref <117)

## 2016-01-04 ENCOUNTER — Other Ambulatory Visit: Payer: Self-pay | Admitting: Internal Medicine

## 2016-02-12 ENCOUNTER — Other Ambulatory Visit: Payer: Self-pay | Admitting: Internal Medicine

## 2016-02-22 ENCOUNTER — Other Ambulatory Visit: Payer: Self-pay | Admitting: Internal Medicine

## 2016-03-10 ENCOUNTER — Other Ambulatory Visit: Payer: Self-pay | Admitting: Internal Medicine

## 2016-03-18 ENCOUNTER — Other Ambulatory Visit: Payer: Self-pay | Admitting: Internal Medicine

## 2016-03-29 ENCOUNTER — Other Ambulatory Visit: Payer: Self-pay | Admitting: Internal Medicine

## 2016-03-29 ENCOUNTER — Encounter: Payer: Self-pay | Admitting: Internal Medicine

## 2016-03-29 ENCOUNTER — Ambulatory Visit (INDEPENDENT_AMBULATORY_CARE_PROVIDER_SITE_OTHER): Payer: Medicare Other | Admitting: Internal Medicine

## 2016-03-29 VITALS — BP 152/80 | HR 68 | Temp 97.5°F | Resp 16 | Ht 64.75 in | Wt 193.0 lb

## 2016-03-29 DIAGNOSIS — M1000 Idiopathic gout, unspecified site: Secondary | ICD-10-CM | POA: Diagnosis not present

## 2016-03-29 DIAGNOSIS — N182 Chronic kidney disease, stage 2 (mild): Secondary | ICD-10-CM

## 2016-03-29 DIAGNOSIS — Z79899 Other long term (current) drug therapy: Secondary | ICD-10-CM | POA: Diagnosis not present

## 2016-03-29 DIAGNOSIS — E1129 Type 2 diabetes mellitus with other diabetic kidney complication: Secondary | ICD-10-CM | POA: Diagnosis not present

## 2016-03-29 DIAGNOSIS — I10 Essential (primary) hypertension: Secondary | ICD-10-CM

## 2016-03-29 DIAGNOSIS — E559 Vitamin D deficiency, unspecified: Secondary | ICD-10-CM | POA: Diagnosis not present

## 2016-03-29 DIAGNOSIS — E1122 Type 2 diabetes mellitus with diabetic chronic kidney disease: Secondary | ICD-10-CM

## 2016-03-29 DIAGNOSIS — E782 Mixed hyperlipidemia: Secondary | ICD-10-CM | POA: Diagnosis not present

## 2016-03-29 DIAGNOSIS — M109 Gout, unspecified: Secondary | ICD-10-CM | POA: Diagnosis not present

## 2016-03-29 LAB — HEPATIC FUNCTION PANEL
ALT: 14 U/L (ref 9–46)
AST: 14 U/L (ref 10–35)
Albumin: 4.4 g/dL (ref 3.6–5.1)
Alkaline Phosphatase: 32 U/L — ABNORMAL LOW (ref 40–115)
BILIRUBIN DIRECT: 0.2 mg/dL (ref <=0.2)
BILIRUBIN INDIRECT: 0.5 mg/dL (ref 0.2–1.2)
BILIRUBIN TOTAL: 0.7 mg/dL (ref 0.2–1.2)
Total Protein: 6.4 g/dL (ref 6.1–8.1)

## 2016-03-29 LAB — CBC WITH DIFFERENTIAL/PLATELET
Basophils Absolute: 0 cells/uL (ref 0–200)
Basophils Relative: 0 %
EOS PCT: 2 %
Eosinophils Absolute: 122 cells/uL (ref 15–500)
HCT: 41 % (ref 38.5–50.0)
HEMOGLOBIN: 14.6 g/dL (ref 13.2–17.1)
LYMPHS ABS: 1403 {cells}/uL (ref 850–3900)
Lymphocytes Relative: 23 %
MCH: 32.1 pg (ref 27.0–33.0)
MCHC: 35.6 g/dL (ref 32.0–36.0)
MCV: 90.1 fL (ref 80.0–100.0)
MONOS PCT: 8 %
MPV: 9.5 fL (ref 7.5–12.5)
Monocytes Absolute: 488 cells/uL (ref 200–950)
NEUTROS ABS: 4087 {cells}/uL (ref 1500–7800)
NEUTROS PCT: 67 %
PLATELETS: 175 10*3/uL (ref 140–400)
RBC: 4.55 MIL/uL (ref 4.20–5.80)
RDW: 14 % (ref 11.0–15.0)
WBC: 6.1 10*3/uL (ref 3.8–10.8)

## 2016-03-29 LAB — HEMOGLOBIN A1C
Hgb A1c MFr Bld: 8.5 % — ABNORMAL HIGH (ref <5.7)
MEAN PLASMA GLUCOSE: 197 mg/dL

## 2016-03-29 LAB — BASIC METABOLIC PANEL WITH GFR
BUN: 18 mg/dL (ref 7–25)
CALCIUM: 9.2 mg/dL (ref 8.6–10.3)
CO2: 25 mmol/L (ref 20–31)
Chloride: 102 mmol/L (ref 98–110)
Creat: 0.92 mg/dL (ref 0.70–1.25)
GFR, EST NON AFRICAN AMERICAN: 85 mL/min (ref >=60)
GFR, Est African American: >89 mL/min (ref >=60)
Glucose, Bld: 196 mg/dL — ABNORMAL HIGH (ref 65–99)
Potassium: 3.6 mmol/L (ref 3.5–5.3)
SODIUM: 137 mmol/L (ref 135–146)

## 2016-03-29 LAB — MAGNESIUM: MAGNESIUM: 1.6 mg/dL (ref 1.5–2.5)

## 2016-03-29 LAB — LIPID PANEL
CHOL/HDL RATIO: 2.6 ratio (ref <=5.0)
CHOLESTEROL: 110 mg/dL — AB (ref 125–200)
HDL: 43 mg/dL (ref >=40)
LDL CALC: 50 mg/dL (ref <130)
TRIGLYCERIDES: 84 mg/dL (ref <150)
VLDL: 17 mg/dL (ref <30)

## 2016-03-29 LAB — URIC ACID: URIC ACID, SERUM: 5 mg/dL (ref 4.0–8.0)

## 2016-03-29 NOTE — Patient Instructions (Signed)

## 2016-03-29 NOTE — Progress Notes (Signed)
Patient ID: Brett [name], male   DOB: [birth year], 70 y.o.   MRN: [id number]  [facility] ADULT & ADOLESCENT INTERNAL MEDICINE                       [name], M.D.        Uvaldo Bristle. Silverio Lay, P.A.-C       Starlyn Skeans, P.A.-C   [facility]                [address] [place] [id number] Telephone [phone] Telefax [phone] _________________________________________________________________________     This very nice 70 y.o. MWM presents for 3 month follow up with Hypertension, Hyperlipidemia, Pre-Diabetes and Vitamin D Deficiency.      Patient is treated for HTN predating circa 1970 & BP has been controlled at home. Today's BP is elevated at 150/80 and rechecked at 140/76. In 2006 he had a negative heart cath.  Patient has had no complaints of any cardiac type chest pain, palpitations, dyspnea/orthopnea/PND, dizziness, claudication, or dependent edema.     Hyperlipidemia is controlled with diet & meds. Patient denies myalgias or other med SE's. Last Lipids were Cholesterol 99*; HDL 46; LDL 41; Triglycerides 60 on 12/25/2015.     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 His dietary compliance is very poor as evidenced by weight and elevated AQ1c's.  Last A1c was 7.5% on  12/25/2015.      Further, the patient also has history of Vitamin D Deficiency and supplements vitamin D without any suspected side-effects. Last vitamin D was 67 on  09/21/2015.  Medication Sig  . Allopurinol  300 MG tablet Take 1 tablet by mouth  daily  . aspirin 81 MG tablet Take 81 mg by mouth daily.    Marland Kitchen atorvastatin  20 MG Take 1 tablet by mouth  daily  . bisoprolol-hctz [facility]) 5-6.25  Take 1 tablet by mouth  daily  . VITAMIN D 1000 UNITS  Take 5,000 Units by mouth daily.   . furosemide  20 MG tablet Take 1  tablet by mouth  every day for blood  pressure and fluid  . glipiZIDE  10 MG tablet Take 1 tablet by mouth  daily before breakfast  . glucose blood test strip Use as instructed  . Loratadine  Take by mouth daily.    Marland Kitchen losartan100 MG tablet Take 1 tablet by mouth  daily  . MAG 400 MG tablet Take 400 mg by mouth daily.  . metFORMIN -XR 500 MG Take 2 tablets (1,000 mg total) by mouth 2 (two) times daily.  . minoxidil10 MG tablet Take 1 tablet by mouth  daily  . montelukast  10 MG tablet Take 1 tablet by mouth at  bedtime  . ranitidine  300 MG tablet Take 1 tablet by mouth at  bedtime  . sitaGLIPtin (JANUVIA) 100 MG  Take 1 tablet (100 mg total) by mouth daily.  Marland Kitchen FLONASE nasal spray Use 2 sprays in each  nostril daily   No Known Allergies  PMHx:   Past Medical History  Diagnosis Date  . Diabetes mellitus without complication ([place])   . OSA (obstructive sleep apnea)   . Allergy   . Gout   . Hemochromatosis   . Dilated  cardiomyopathy ([place])     non isch  . Hypertension   . Colon polyp    Immunization History  Administered Date(s) Administered  . DT 01/21/2014  . Influenza Split 09/11/2013  . Influenza,inj,Quad PF,36+ Mos 07/28/2014  . Influenza-Unspecified 08/21/2012  . Pneumococcal Conjugate-13 09/11/2014  . Pneumococcal-Unspecified 08/21/2012   Past Surgical History  Procedure Laterality Date  . Eye surgery Bilateral     Cataract   FHx:    Reviewed / unchanged  SHx:    Reviewed / unchanged  Systems Review:  Constitutional: Denies fever, chills, wt changes, headaches, insomnia, fatigue, night sweats, change in appetite. Eyes: Denies redness, blurred vision, diplopia, discharge, itchy, watery eyes.  ENT: Denies discharge, congestion, post nasal drip, epistaxis, sore throat, earache, hearing loss, dental pain, tinnitus, vertigo, sinus pain, snoring.  CV: Denies chest pain, palpitations, irregular heartbeat, syncope, dyspnea, diaphoresis, orthopnea, PND, claudication or  edema. Respiratory: denies cough, dyspnea, DOE, pleurisy, hoarseness, laryngitis, wheezing.  Gastrointestinal: Denies dysphagia, odynophagia, heartburn, reflux, water brash, abdominal pain or cramps, nausea, vomiting, bloating, diarrhea, constipation, hematemesis, melena, hematochezia  or hemorrhoids. Genitourinary: Denies dysuria, frequency, urgency, nocturia, hesitancy, discharge, hematuria or flank pain. Musculoskeletal: Denies arthralgias, myalgias, stiffness, jt. swelling, pain, limping or strain/sprain.  Skin: Denies pruritus, rash, hives, warts, acne, eczema or change in skin lesion(s). Neuro: No weakness, tremor, incoordination, spasms, paresthesia or pain. Psychiatric: Denies confusion, memory loss or sensory loss. Endo: Denies change in weight, skin or hair change.  Heme/Lymph: No excessive bleeding, bruising or enlarged lymph nodes.  Physical Exam  BP 152/80  - rechecked at 140/76     P  68  T  97.5 F   Resp 16  Ht 5' 4.75"   Wt 193 lb     BMI 32.35   Appears well nourished and in no distress. Eyes: PERRLA, EOMs, conjunctiva no swelling or erythema. Sinuses: No frontal/maxillary tenderness ENT/Mouth: EAC's clear, TM's nl w/o erythema, bulging. Nares clear w/o erythema, swelling, exudates. Oropharynx clear without erythema or exudates. Oral hygiene is good. Tongue normal, non obstructing. Hearing intact.  Neck: Supple. Thyroid nl. Car 2+/2+ without bruits, nodes or JVD. Chest: Respirations nl with BS clear & equal w/o rales, rhonchi, wheezing or stridor.  Cor: Heart sounds normal w/ regular rate and rhythm without sig. murmurs, gallops, clicks, or rubs. Peripheral pulses normal and equal  without edema.  Abdomen: Soft & bowel sounds normal. Non-tender w/o guarding, rebound, hernias, masses, or organomegaly.  Lymphatics: Unremarkable.  Musculoskeletal: Full ROM all peripheral extremities, joint stability, 5/5 strength, and normal gait.  Skin: Warm, dry without exposed rashes,  lesions or ecchymosis apparent.  Neuro: Cranial nerves intact, reflexes equal bilaterally. Sensory-motor testing grossly intact. Tendon reflexes grossly intact.  Pysch: Alert & oriented x 3.  Insight and judgement nl & appropriate. No ideations.  Assessment and Plan:  1. Essential hypertension  - TSH  2. Hyperlipidemia  - Lipid panel - TSH  3. Type 2 diabetes mellitus with stage 2 chronic kidney disease, without long-term current use of insulin (HCC)  - Hemoglobin A1c - Insulin, random  4. Vitamin D deficiency  - VITAMIN D 25 Hydroxy   5. Idiopathic gout,  - Uric acid  6. Medication management  - CBC with Differential/Platelet - BASIC METABOLIC PANEL WITH GFR - Hepatic function panel - Magnesium   Recommended regular exercise, BP monitoring, weight control, and discussed med and SE's. Recommended labs to assess and monitor clinical status. Further disposition pending results of labs. Over 30 minutes of  exam, counseling, chart review was performed

## 2016-03-30 ENCOUNTER — Other Ambulatory Visit (HOSPITAL_BASED_OUTPATIENT_CLINIC_OR_DEPARTMENT_OTHER): Payer: Medicare Other

## 2016-03-30 ENCOUNTER — Telehealth: Payer: Self-pay | Admitting: *Deleted

## 2016-03-30 ENCOUNTER — Telehealth: Payer: Self-pay | Admitting: Oncology

## 2016-03-30 LAB — VITAMIN D 25 HYDROXY (VIT D DEFICIENCY, FRACTURES): Vit D, 25-Hydroxy: 75 ng/mL (ref 30–100)

## 2016-03-30 LAB — TSH: TSH: 2.69 mIU/L (ref 0.40–4.50)

## 2016-03-30 LAB — INSULIN, RANDOM: Insulin: 8.3 u[IU]/mL (ref 2.0–19.6)

## 2016-03-30 LAB — FERRITIN: FERRITIN: 57 ng/mL (ref 22–316)

## 2016-03-30 NOTE — Telephone Encounter (Signed)
-----   Message from [name], MD sent at 03/30/2016  4:00 PM EDT ----- Please call patient, ferritin is ok, f/u as scheduled

## 2016-03-30 NOTE — Telephone Encounter (Signed)
Per Dr. Benay Spice pt notified that ferritin level is OK.  Patient appreciative of call and has no questions or concerns at this time.

## 2016-03-30 NOTE — Telephone Encounter (Signed)
Printed new sched for pt

## 2016-05-09 ENCOUNTER — Other Ambulatory Visit: Payer: Self-pay | Admitting: Internal Medicine

## 2016-05-17 ENCOUNTER — Telehealth: Payer: Self-pay | Admitting: Oncology

## 2016-05-17 NOTE — Telephone Encounter (Signed)
Appointment confirmed with patient. Brett [name] °

## 2016-07-07 ENCOUNTER — Encounter: Payer: Self-pay | Admitting: Physician Assistant

## 2016-07-07 ENCOUNTER — Ambulatory Visit (INDEPENDENT_AMBULATORY_CARE_PROVIDER_SITE_OTHER): Payer: Medicare Other | Admitting: Physician Assistant

## 2016-07-07 VITALS — BP 132/76 | HR 75 | Temp 98.6°F | Resp 16 | Ht 64.75 in | Wt 198.0 lb

## 2016-07-07 DIAGNOSIS — N182 Chronic kidney disease, stage 2 (mild): Secondary | ICD-10-CM | POA: Diagnosis not present

## 2016-07-07 DIAGNOSIS — Z79899 Other long term (current) drug therapy: Secondary | ICD-10-CM | POA: Diagnosis not present

## 2016-07-07 DIAGNOSIS — E782 Mixed hyperlipidemia: Secondary | ICD-10-CM

## 2016-07-07 DIAGNOSIS — E1122 Type 2 diabetes mellitus with diabetic chronic kidney disease: Secondary | ICD-10-CM | POA: Diagnosis not present

## 2016-07-07 DIAGNOSIS — I10 Essential (primary) hypertension: Secondary | ICD-10-CM | POA: Diagnosis not present

## 2016-07-07 DIAGNOSIS — E1165 Type 2 diabetes mellitus with hyperglycemia: Secondary | ICD-10-CM

## 2016-07-07 DIAGNOSIS — R3915 Urgency of urination: Secondary | ICD-10-CM

## 2016-07-07 DIAGNOSIS — I420 Dilated cardiomyopathy: Secondary | ICD-10-CM

## 2016-07-07 DIAGNOSIS — E559 Vitamin D deficiency, unspecified: Secondary | ICD-10-CM

## 2016-07-07 DIAGNOSIS — Z23 Encounter for immunization: Secondary | ICD-10-CM

## 2016-07-07 LAB — CBC WITH DIFFERENTIAL/PLATELET
BASOS ABS: 63 {cells}/uL (ref 0–200)
Basophils Relative: 1 %
EOS ABS: 189 {cells}/uL (ref 15–500)
Eosinophils Relative: 3 %
HEMATOCRIT: 42.2 % (ref 38.5–50.0)
HEMOGLOBIN: 15.3 g/dL (ref 13.2–17.1)
LYMPHS ABS: 1512 {cells}/uL (ref 850–3900)
LYMPHS PCT: 24 %
MCH: 33.3 pg — AB (ref 27.0–33.0)
MCHC: 36.3 g/dL — AB (ref 32.0–36.0)
MCV: 91.7 fL (ref 80.0–100.0)
MONO ABS: 567 {cells}/uL (ref 200–950)
MPV: 9.7 fL (ref 7.5–12.5)
Monocytes Relative: 9 %
NEUTROS PCT: 63 %
Neutro Abs: 3969 cells/uL (ref 1500–7800)
Platelets: 200 10*3/uL (ref 140–400)
RBC: 4.6 MIL/uL (ref 4.20–5.80)
RDW: 13.1 % (ref 11.0–15.0)
WBC: 6.3 10*3/uL (ref 3.8–10.8)

## 2016-07-07 LAB — TSH: TSH: 3.79 mIU/L (ref 0.40–4.50)

## 2016-07-07 MED ORDER — GLIPIZIDE 10 MG PO TABS
10.0000 mg | ORAL_TABLET | Freq: Three times a day (TID) | ORAL | 1 refills | Status: DC
Start: 2016-07-07 — End: 2016-07-07

## 2016-07-07 MED ORDER — GLUCOSE BLOOD VI STRP: ORAL_STRIP | 6 refills | Status: DC

## 2016-07-07 MED ORDER — GLIPIZIDE 10 MG PO TABS: 10.0000 mg | ORAL_TABLET | Freq: Three times a day (TID) | ORAL | 1 refills | Status: DC

## 2016-07-07 NOTE — Patient Instructions (Addendum)
Please try to eat 3 smaller meals a day Continue the metformin and the [place] 1/2 a day Increase the glipizide 10mg  to 3 x a day  Will follow up 1 month for DM education visit, we will decide at that time if you need insulin  Check your sugar 2-3 times a day and keep a log and bring them with you Check your sugar in the morning before food Check your sugar before dinner and 2 hours after dinner   Hypoglycemia Low blood sugar (hypoglycemia) means that the level of sugar in your blood is lower than it should be. Signs of low blood sugar include:  Getting sweaty.  Feeling hungry.  Feeling dizzy or weak.  Feeling sleepier than normal.  Feeling nervous.  Headaches.  Having a fast heartbeat. Low blood sugar can happen fast and can be an emergency. Your doctor can do tests to check your blood sugar level. You can have low blood sugar and not have diabetes. HOME CARE  Check your blood sugar as told by your doctor. If it is less than 70 mg/dl or as told by your doctor, take 1 of the following:  3 to 4 glucose tablets.   cup clear juice.   cup soda pop, not diet.  1 cup milk.  5 to 6 hard candies.  Recheck blood sugar after 15 minutes. Repeat until it is at the right level.  Eat a snack if it is more than 1 hour until the next meal.  Only take medicine as told by your doctor.  Do not skip meals. Eat on time.  Do not drink alcohol except with meals.  Check your blood glucose before driving.  Check your blood glucose before and after exercise.  Always carry treatment with you, such as glucose pills.  Always wear a medical alert bracelet if you have diabetes. GET HELP RIGHT AWAY IF:   Your blood glucose goes below 70 mg/dl or as told by your doctor, and you:  Are confused.  Are not able to swallow.  Pass out (faint).  You cannot treat yourself. You may need someone to help you.  You have low blood sugar problems often.  You have problems from your  medicines.  You are not feeling better after 3 to 4 days.  You have vision changes. MAKE SURE YOU:   Understand these instructions.  Will watch this condition.  Will get help right away if you are not doing well or get worse.   This information is not intended to replace advice given to you by your health care provider. Make sure you discuss any questions you have with your health care provider.   Document Released: 01/04/2010 Document Revised: 10/31/2014 Document Reviewed: 06/16/2015 Elsevier Interactive Patient Education 2016 [place] for Glucose Readings: Time of Check Usual Target for Most People  Before Meals  70-130  Two hours after meals  Less than 180  Bedtime  90-150   Why Should You Check Your Blood Glucose? -The A1C tells you how your diabetes is doing over a 3 month period.  Home blood glucose monitoring (or checking) gives you information about your diabetes on a daily basis.  You will learn how well your diabetes care plan is working and whether your blood glucose is in your target range throughout the day.   -Reviewing daily blood glucose levels will help you and your healthcare team make any needed changes to you meal plan, physical activity and medications.  Meter Supplies: -  A glucose meter was provided at your visit today along with strips and lancets. The blood glucose meter strips and lancets are usually covered by health insurance. Please let us know if they are not and contact your insurance to see which meter they prefer.  How to get a good blood sample: -Wash your hands in warm water.  You do not need to use alcohol wipes. -Massage your hands. -Choose which finger you will use.  It helps to use a different finger each time to avoid soreness. -Keep you hand below your wrist when using you lancet to "prick" your finger. -Apply gentle pressure, but do NOT squeeze your finger.  Checking your blood glucose Important Reminders: -Make sure  your strips are not expired.  Check the date on the bottle. -Make sure the code on bottle matches the code on your machine. -Make sure your hands are clean and dry. -Do not use the center of your finger, it is the most sensitive area.  Use a spot to the side of the center of your fingertip. -Completely fill the strip target area with blood (until it beeps) to make sure the results are accurate. -You will need a prescription to have your glucose strips and lancets covered by insurance. -There is usually an 800 number on the back of your meter for help with meter issues.  How often to check: -If you take diabetes pills or take one injection of insulin each day, you will usually be asked to check twice a day, before breakfast and 2 hours after one meal, or as directed. -If you take several insulin injections each day, you will usually be asked to check four times a day before meals and at bedtime every day.   I think it is possible that you have sleep apnea. It can cause interrupted sleep, headaches, frequent awakenings, fatigue, dry mouth, fast/slow heart beats, memory issues, anxiety/depression, swelling, numbness tingling hands/feet, weight gain, shortness of breath, and the list goes on. Sleep apnea needs to be ruled out because if it is left untreated it does eventually lead to abnormal heart beats, lung failure or heart failure as well as increasing the risk of heart attack and stroke. There are masks you can wear OR a mouth piece that I can give you information about. Often times though people feel MUCH better after getting treatment.   Sleep Apnea  Sleep apnea is a sleep disorder characterized by abnormal pauses in breathing while you sleep. When your breathing pauses, the level of oxygen in your blood decreases. This causes you to move out of deep sleep and into light sleep. As a result, your quality of sleep is poor, and the system that carries your blood throughout your body (cardiovascular  system) experiences stress. If sleep apnea remains untreated, the following conditions can develop:  High blood pressure (hypertension).  Coronary artery disease.  Inability to achieve or maintain an erection (impotence).  Impairment of your thought process (cognitive dysfunction). There are three types of sleep apnea: 1. Obstructive sleep apnea--Pauses in breathing during sleep because of a blocked airway. 2. Central sleep apnea--Pauses in breathing during sleep because the area of the brain that controls your breathing does not send the correct signals to the muscles that control breathing. 3. Mixed sleep apnea--A combination of both obstructive and central sleep apnea.  RISK FACTORS The following risk factors can increase your risk of developing sleep apnea:  Being overweight.  Smoking.  Having narrow passages in your nose and throat.  Being of older age.  Being male.  Alcohol use.  Sedative and tranquilizer use.  Ethnicity. Among individuals younger than 35 years, African Americans are at increased risk of sleep apnea. SYMPTOMS   Difficulty staying asleep.  Daytime sleepiness and fatigue.  Loss of energy.  Irritability.  Loud, heavy snoring.  Morning headaches.  Trouble concentrating.  Forgetfulness.  Decreased interest in sex. DIAGNOSIS  In order to diagnose sleep apnea, your caregiver will perform a physical examination. Your caregiver may suggest that you take a home sleep test. Your caregiver may also recommend that you spend the night in a sleep lab. In the sleep lab, several monitors record information about your heart, lungs, and brain while you sleep. Your leg and arm movements and blood oxygen level are also recorded. TREATMENT The following actions may help to resolve mild sleep apnea:  Sleeping on your side.   Using a decongestant if you have nasal congestion.   Avoiding the use of depressants, including alcohol, sedatives, and narcotics.    Losing weight and modifying your diet if you are overweight. There also are devices and treatments to help open your airway:  Oral appliances. These are custom-made mouthpieces that shift your lower jaw forward and slightly open your bite. This opens your airway.  Devices that create positive airway pressure. This positive pressure "splints" your airway open to help you breathe better during sleep. The following devices create positive airway pressure:  Continuous positive airway pressure (CPAP) device. The CPAP device creates a continuous level of air pressure with an air pump. The air is delivered to your airway through a mask while you sleep. This continuous pressure keeps your airway open.  Nasal expiratory positive airway pressure (EPAP) device. The EPAP device creates positive air pressure as you exhale. The device consists of single-use valves, which are inserted into each nostril and held in place by adhesive. The valves create very little resistance when you inhale but create much more resistance when you exhale. That increased resistance creates the positive airway pressure. This positive pressure while you exhale keeps your airway open, making it easier to breath when you inhale again.  Bilevel positive airway pressure (BPAP) device. The BPAP device is used mainly in patients with central sleep apnea. This device is similar to the CPAP device because it also uses an air pump to deliver continuous air pressure through a mask. However, with the BPAP machine, the pressure is set at two different levels. The pressure when you exhale is lower than the pressure when you inhale.  Surgery. Typically, surgery is only done if you cannot comply with less invasive treatments or if the less invasive treatments do not improve your condition. Surgery involves removing excess tissue in your airway to create a wider passage way. Document Released: 09/30/2002 Document Revised: 02/04/2013 Document  Reviewed: 02/16/2012 [facility] Patient Information 2015 [place], [place]. This information is not intended to replace advice given to you by your health care provider. Make sure you discuss any questions you have with your health care provider.

## 2016-07-07 NOTE — Progress Notes (Signed)
Assessment and Plan:  Hypertension:  -Continue medication -monitor blood pressure at home. -Continue DASH diet -Reminder to go to the ER if any CP, SOB, nausea, dizziness, severe HA, changes vision/speech, left arm numbness and tingling and jaw pain.  Cholesterol - Continue diet and exercise -Check cholesterol.   Diabetes with diabetic chronic kidney disease -Continue diet and exercise.  -Check A1C - will check for infection - will increase glipizide to TID, discussed hypoglycemia risk - possible after 40+ years with DM that he may become insulin dependent - follow up 1 month, if sugars are not better we will stop oral meds and switch to insuling  Vitamin D Def -check level -continue medications.   Hemochromatosis Continue follow up oncology, has appointment next week  Dilated cardiomyopathy ([place]) Control blood pressure, cholesterol, glucose, increase exercise.  Suggest getting on CPAP  Need for prophylactic vaccination and inoculation against influenza  Urgency of urination -     Urinalysis, Routine w reflex microscopic (not at [facility]) -     Urine culture - if negative may start on doxazosin  Morbid Obesity with co morbidities - long discussion about weight loss, diet, and exercise    Continue diet and meds as discussed. Further disposition pending results of labs. Discussed med's effects and SE's.    HPI 70 y.o. male  presents for 3 month follow up with hypertension, hyperlipidemia, diabetes and vitamin D deficiency.   His blood pressure has been controlled at home, today their BP is BP: 132/76.He does workout. He had negative heart cath in 2006, patient has NIDCMP, weight is stable.  He denies chest pain, shortness of breath, dizziness. BMI is Body mass index is 33.2 kg/m., he is working on diet and exercise. Wt Readings from Last 3 Encounters:  07/07/16 198 lb (89.8 kg)  03/29/16 193 lb (87.5 kg)  12/25/15 196 lb (88.9 kg)    He is on cholesterol medication  and denies myalgias. His cholesterol is at goal. The cholesterol was:  03/29/2016: Cholesterol 110; HDL 43; LDL Cholesterol 50; Triglycerides 84   He has been working on diet and exercise for diabetes with diabetic chronic kidney disease, he is on bASA, he is on ACE/ARB, and denies  foot ulcerations, hyperglycemia, hypoglycemia , increased appetite, nausea, paresthesia of the feet, polydipsia, polyuria, visual disturbances, vomiting and weight loss. State his sugars have been in 200-300's,   Metformin 2000mg  daily, januvia 100mg  taking half, he is on the gliizide 10 once a day. , it is better if he is walking but he has only been doing yard. Checks it in the morning before he eats.  Last A1C was: 03/29/2016: Hgb A1c MFr Bld 8.5   Patient is on Vitamin D supplement. 03/29/2016: Vit D, 25-Hydroxy 75   Has been having issues controlling urine with urgency, started with slow stream, hesictancy in last few months. No fever, chills, no dysuria.   Current Medications:  Current Outpatient Prescriptions on File Prior to Visit  Medication Sig Dispense Refill  . allopurinol (ZYLOPRIM) 300 MG tablet Take 1 tablet by mouth  daily 90 tablet 1  . aspirin 81 MG tablet Take 81 mg by mouth daily.      Marland Kitchen atorvastatin (LIPITOR) 20 MG tablet Take 1 tablet by mouth  daily 90 tablet 1  . bisoprolol-hydrochlorothiazide (ZIAC) 5-6.25 MG tablet Take 1 tablet by mouth  daily 90 tablet 1  . Cholecalciferol (VITAMIN D) 1000 UNITS capsule Take 5,000 Units by mouth daily.     . furosemide (  LASIX) 20 MG tablet Take 1 tablet by mouth  every day for blood  pressure and fluid 90 tablet 1  . glipiZIDE (GLUCOTROL) 10 MG tablet Take 1 tablet by mouth  daily before breakfast 90 tablet 1  . glucose blood test strip Use as instructed 100 each 6  . JANUVIA 100 MG tablet Take 1 tablet by mouth  daily 90 tablet 1  . Loratadine (CLARITIN PO) Take by mouth daily.      Marland Kitchen losartan (COZAAR) 100 MG tablet Take 1 tablet by mouth  daily 90 tablet 1   . magnesium oxide (MAG-OX) 400 MG tablet Take 400 mg by mouth daily.    . metFORMIN (GLUCOPHAGE-XR) 500 MG 24 hr tablet Take 2 tablets by mouth two times daily 360 tablet 1  . minoxidil (LONITEN) 10 MG tablet Take 1 tablet by mouth  daily 90 tablet 1  . montelukast (SINGULAIR) 10 MG tablet Take 1 tablet by mouth at  bedtime 90 tablet 1  . ranitidine (ZANTAC) 300 MG tablet Take 1 tablet by mouth at  bedtime 90 tablet 1   No current facility-administered medications on file prior to visit.    Medical History:  Past Medical History:  Diagnosis Date  . Allergy   . Colon polyp   . Diabetes mellitus without complication ([place])   . Dilated cardiomyopathy ([place])    non isch  . Gout   . Hemochromatosis   . Hypertension   . OSA (obstructive sleep apnea)    Allergies: No Known Allergies   Review of Systems:  Review of Systems  Constitutional: Negative for chills, fever and malaise/fatigue.  HENT: Negative for ear pain and sore throat.   Respiratory: Negative for cough, shortness of breath and wheezing.   Cardiovascular: Negative for chest pain, palpitations and leg swelling.  Gastrointestinal: Negative for abdominal pain, blood in stool, constipation, diarrhea, heartburn and melena.  Genitourinary: Positive for frequency and urgency.  Neurological: Negative for dizziness, sensory change, loss of consciousness and headaches.  Psychiatric/Behavioral: Negative for depression. The patient is not nervous/anxious and does not have insomnia.     Family history- Review and unchanged  Social history- Review and unchanged  Physical Exam: BP 132/76   Pulse 75   Temp 98.6 F (37 C)   Resp 16   Ht 5' 4.75" (1.645 m)   Wt 198 lb (89.8 kg)   SpO2 95%   BMI 33.20 kg/m  Wt Readings from Last 3 Encounters:  07/07/16 198 lb (89.8 kg)  03/29/16 193 lb (87.5 kg)  12/25/15 196 lb (88.9 kg)   General Appearance: Well nourished well developed, non-toxic appearing, in no apparent distress. Eyes:  PERRLA, EOMs, conjunctiva no swelling or erythema ENT/Mouth: Ear canals clear with no erythema, swelling, or discharge.  TMs normal bilaterally, oropharynx clear, moist, with no exudate.   Neck: Supple, thyroid normal, no JVD, no cervical adenopathy.  Respiratory: Respiratory effort normal, breath sounds clear A&P, no wheeze, rhonchi or rales noted.  No retractions, no accessory muscle usage Cardio: RRR with no MRGs. No noted edema.  Abdomen: Soft, + BS.  Non tender, no guarding, rebound, hernias, masses. Musculoskeletal: Full ROM, 5/5 strength, Normal gait Skin: Warm, dry without rashes, lesions, ecchymosis.  Neuro: Awake and oriented X 3, Cranial nerves intact. No cerebellar symptoms.  Psych: normal affect, Insight and Judgment appropriate.    Vicie Mutters, PA-C 8:51 AM [facility]

## 2016-07-08 LAB — HEPATIC FUNCTION PANEL
ALBUMIN: 4.6 g/dL (ref 3.6–5.1)
ALT: 19 U/L (ref 9–46)
AST: 15 U/L (ref 10–35)
Alkaline Phosphatase: 31 U/L — ABNORMAL LOW (ref 40–115)
BILIRUBIN INDIRECT: 0.7 mg/dL (ref 0.2–1.2)
Bilirubin, Direct: 0.2 mg/dL (ref <=0.2)
TOTAL PROTEIN: 6.8 g/dL (ref 6.1–8.1)
Total Bilirubin: 0.9 mg/dL (ref 0.2–1.2)

## 2016-07-08 LAB — URINALYSIS, ROUTINE W REFLEX MICROSCOPIC
BILIRUBIN URINE: NEGATIVE
HGB URINE DIPSTICK: NEGATIVE
Ketones, ur: NEGATIVE
LEUKOCYTES UA: NEGATIVE
Nitrite: NEGATIVE
PROTEIN: NEGATIVE
Specific Gravity, Urine: 1.013 (ref 1.001–1.035)
pH: 5.5 (ref 5.0–8.0)

## 2016-07-08 LAB — BASIC METABOLIC PANEL WITH GFR
BUN: 13 mg/dL (ref 7–25)
CALCIUM: 9.6 mg/dL (ref 8.6–10.3)
CO2: 28 mmol/L (ref 20–31)
Chloride: 99 mmol/L (ref 98–110)
Creat: 1.08 mg/dL (ref 0.70–1.25)
GFR, EST AFRICAN AMERICAN: 81 mL/min (ref >=60)
GFR, EST NON AFRICAN AMERICAN: 70 mL/min (ref >=60)
GLUCOSE: 287 mg/dL — AB (ref 65–99)
Potassium: 3.9 mmol/L (ref 3.5–5.3)
Sodium: 138 mmol/L (ref 135–146)

## 2016-07-08 LAB — LIPID PANEL
CHOLESTEROL: 125 mg/dL (ref 125–200)
HDL: 39 mg/dL — ABNORMAL LOW (ref >=40)
LDL CALC: 55 mg/dL (ref <130)
TRIGLYCERIDES: 154 mg/dL — AB (ref <150)
Total CHOL/HDL Ratio: 3.2 Ratio (ref <=5.0)
VLDL: 31 mg/dL — ABNORMAL HIGH (ref <30)

## 2016-07-08 LAB — MAGNESIUM: MAGNESIUM: 1.8 mg/dL (ref 1.5–2.5)

## 2016-07-08 LAB — HEMOGLOBIN A1C
Hgb A1c MFr Bld: 8.2 % — ABNORMAL HIGH (ref <5.7)
MEAN PLASMA GLUCOSE: 189 mg/dL

## 2016-07-08 LAB — URINE CULTURE: ORGANISM ID, BACTERIA: NO GROWTH

## 2016-07-13 ENCOUNTER — Other Ambulatory Visit: Payer: Self-pay | Admitting: Physician Assistant

## 2016-07-26 ENCOUNTER — Telehealth: Payer: Self-pay | Admitting: *Deleted

## 2016-07-26 ENCOUNTER — Other Ambulatory Visit (HOSPITAL_BASED_OUTPATIENT_CLINIC_OR_DEPARTMENT_OTHER): Payer: Medicare Other

## 2016-07-26 ENCOUNTER — Telehealth: Payer: Self-pay | Admitting: Oncology

## 2016-07-26 ENCOUNTER — Ambulatory Visit (HOSPITAL_BASED_OUTPATIENT_CLINIC_OR_DEPARTMENT_OTHER): Payer: Medicare Other | Admitting: Oncology

## 2016-07-26 ENCOUNTER — Other Ambulatory Visit: Payer: Medicare Other

## 2016-07-26 DIAGNOSIS — I10 Essential (primary) hypertension: Secondary | ICD-10-CM

## 2016-07-26 DIAGNOSIS — E119 Type 2 diabetes mellitus without complications: Secondary | ICD-10-CM

## 2016-07-26 DIAGNOSIS — Z87891 Personal history of nicotine dependence: Secondary | ICD-10-CM | POA: Diagnosis not present

## 2016-07-26 LAB — FERRITIN: Ferritin: 65 ng/ml (ref 22–316)

## 2016-07-26 NOTE — Telephone Encounter (Signed)
Per Dr. Benay Spice, patient notified that ferritin is mildly elevated above goal (50), hold on phlebotomy for now and to return as scheduled for ferritin in 4 months.  Pt appreciative of call and has no questions or concerns at this time.

## 2016-07-26 NOTE — Progress Notes (Signed)
  [place] OFFICE PROGRESS NOTE   Diagnosis: Hemochromatosis  INTERVAL HISTORY:   Mr. Sheils returns as scheduled. He feels well. He reports difficulty managing his diabetes. He is now taking multiple oral hypoglycemics. He last underwent phlebotomy therapy in September 2016.  Objective:  Vital signs in last 24 hours:  Blood pressure (!) 146/80, pulse 74, temperature 98.4 F (36.9 C), temperature source Oral, resp. rate 17, height 5' 4.75" (1.645 m), weight 199 lb 9.6 oz (90.5 kg), SpO2 100 %.    Resp: Lungs clear bilaterally Cardio: Distant heart sounds, regular rate and rhythm GI: No hepatosplenomegaly Vascular: No leg edema   Lab Results:  Lab Results  Component Value Date   WBC 6.3 07/07/2016   HGB 15.3 07/07/2016   HCT 42.2 07/07/2016   MCV 91.7 07/07/2016   PLT 200 07/07/2016   NEUTROABS 3,969 07/07/2016   Ferritin pending  Medications: I have reviewed the patient's current medications.  Assessment/Plan: 1. 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 He was last treated with phlebotomy therapy on 06/25/2015 2. History of tobacco use in the remote past. 3. Hypertension. 4. Hypercholesterolemia. 5. Diabetes. 6. Gout. 7. Sleep apnea. 8. Family history of hemochromatosis.    Disposition:  Brett [name] appears stable. We will follow-up on the ferritin level from today and proceed with phlebotomy as indicated. He will return for a ferritin level in 4 months. He will be scheduled for a one-year office visit.  Betsy Coder, MD  07/26/2016  8:51 AM

## 2016-07-26 NOTE — Telephone Encounter (Signed)
Spoke with patient re next appointments for 2/1 and mailed schedule.

## 2016-07-26 NOTE — Telephone Encounter (Signed)
-----   Message from [name], MD sent at 07/26/2016  3:20 PM EDT ----- Please call patient, ferritin is mildly elevated above goal(50), hold on phlebotomy, return as scheduled for ferritin in 4 months

## 2016-07-29 ENCOUNTER — Other Ambulatory Visit: Payer: Self-pay | Admitting: Internal Medicine

## 2016-08-11 ENCOUNTER — Encounter: Payer: Self-pay | Admitting: Physician Assistant

## 2016-08-11 ENCOUNTER — Ambulatory Visit (INDEPENDENT_AMBULATORY_CARE_PROVIDER_SITE_OTHER): Payer: Medicare Other | Admitting: Physician Assistant

## 2016-08-11 VITALS — BP 118/72 | HR 82 | Temp 97.7°F | Resp 16 | Ht 64.75 in | Wt 198.2 lb

## 2016-08-11 DIAGNOSIS — I10 Essential (primary) hypertension: Secondary | ICD-10-CM

## 2016-08-11 DIAGNOSIS — N182 Chronic kidney disease, stage 2 (mild): Secondary | ICD-10-CM | POA: Diagnosis not present

## 2016-08-11 DIAGNOSIS — E1122 Type 2 diabetes mellitus with diabetic chronic kidney disease: Secondary | ICD-10-CM | POA: Diagnosis not present

## 2016-08-11 NOTE — Patient Instructions (Addendum)
Continue your bASA daily Call Dr. Zigmund Daniel Phone: [phone]; for your eye appointment  Your A1C is a measure of your sugar over the past 3 months and is not affected by what you have eaten over the past few days. Diabetes increases your chances of stroke and heart attack over 300 % and is the leading cause of blindness and kidney failure in the [place]. Please make sure you decrease bad carbs like white bread, white rice, potatoes, corn, soft drinks, pasta, cereals, refined sugars, sweet tea, dried fruits, and fruit juice. Good carbs are okay to eat in moderation like sweet potatoes, brown rice, whole grain pasta/bread, most fruit (except dried fruit) and you can eat as many veggies as you want.   Greater than 6.5 is considered diabetic. Between 6.4 and 5.7 is prediabetic If your A1C is less than 5.7 you are NOT diabetic.  Targets for Glucose Readings: Time of Check Target for patients WITHOUT Diabetes Target for DIABETICS  Before Meals Less than 100  less than 150  Two hours after meals Less than 200  Less than 250   If your morning sugar is always below 100 then the issue is with your sugar spiking after meals. Try to take your blood sugar approximately 2 hours after eating, this number should be less than 200. If it is not, think about the foods that you ate and better choices you can make.      Bad carbs also include fruit juice, alcohol, and sweet tea. These are empty calories that do not signal to your brain that you are full.   Please remember the good carbs are still carbs which convert into sugar. So please measure them out no more than 1/2-1 cup of rice, oatmeal, pasta, and beans  Veggies are however free foods! Pile them on.   Not all fruit is created equal. Please see the list below, the fruit at the bottom is higher in sugars than the fruit at the top. Please avoid all dried fruits.    Diabetes is a very complicated disease...lets simplify it.  An easy way to look  at it to understand the complications is if you think of the extra sugar floating in your blood stream as glass shards floating through your blood stream.    Diabetes affects your small vessels first: 1) The glass shards (sugar) scraps down the tiny blood vessels in your eyes and lead to diabetic retinopathy, the leading cause of blindness in the [place]. Diabetes is the leading cause of newly diagnosed adult (74 to 70 years of age) blindness in the [place].  2) The glass shards scratches down the tiny vessels of your legs leading to nerve damage called neuropathy and can lead to amputations of your feet. More than 60% of all non-traumatic amputations of lower limbs occur in people with diabetes.  3) Over time the small vessels in your brain are shredded and closed off, individually this does not cause any problems but over a long period of time many of the small vessels being blocked can lead to Vascular Dementia.   4) Your kidney's are a filter system and have a "net" that keeps certain things in the body and lets bad things out. Sugar shreds this net and leads to kidney damage and eventually failure. Decreasing the sugar that is destroying the net and certain blood pressure medications can help stop or decrease progression of kidney disease. Diabetes was the primary cause of kidney failure in 44 percent of  all new cases in 2011.  5) Diabetes also destroys the small vessels in your penis that lead to erectile dysfunction. Eventually the vessels are so damaged that you may not be responsive to cialis or viagra.   Diabetes and your large vessels: Your larger vessels consist of your coronary arteries in your heart and the carotid vessels to your brain. Diabetes or even increased sugars put you at 300% increased risk of heart attack and stroke and this is why.. The sugar scrapes down your large blood vessels and your body sees this as an internal injury and tries to repair itself. Just like you get a  scab on your skin, your platelets will stick to the blood vessel wall trying to heal it. This is why we have diabetics on low dose aspirin daily, this prevents the platelets from sticking and can prevent plaque formation. In addition, your body takes cholesterol and tries to shove it into the open wound. This is why we want your LDL, or bad cholesterol, below 70.   The combination of platelets and cholesterol over 5-10 years forms plaque that can break off and cause a heart attack or stroke.   PLEASE REMEMBER:  Diabetes is preventable! Up to 25 percent of complications and morbidities among individuals with type 2 diabetes can be prevented, delayed, or effectively treated and minimized with regular visits to a health professional, appropriate monitoring and medication, and a healthy diet and lifestyle.

## 2016-08-11 NOTE — Progress Notes (Signed)
Assessment and Plan: Diabetes Call for eye appointment Check sugars and bring in records Continue glipizide TID Eat 3 meals a day, increase veggies, decrease fruit Recheck A1C Dec  Future Appointments Date Time Provider [place]  09/29/2016 11:00 AM Vicie Mutters, PA-C [facility] None  11/24/2016 9:00 AM [name], MD [facility] None  11/24/2016 11:00 AM [facility] 3 [facility] None  03/24/2017 11:00 AM [facility] 3 [facility] None  07/25/2017 11:00 AM [facility] 3 [facility] None  07/25/2017 11:30 AM [name], MD [facility] None     HPI 70 y.o.male with history of DM, NIDCMP, htemochromatosis, HTN, OSA presents for DM follow up. Last visit his glipizide was increase to TID. He was checking his sugar and his sugar was below 200's in the morning however his machine broke and they sent a new one that did not work, he is still waiting on new testing machine/strips.  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 He denies any hypoglycemia. Eats 2 meals a day, eats a lot of fruit.   Lab Results  Component Value Date   HGBA1C 8.2 (H) 07/07/2016   BMI is Body mass index is 33.24 kg/m., he is working on diet and exercise. Wt Readings from Last 3 Encounters:  08/11/16 198 lb 3.2 oz (89.9 kg)  07/26/16 199 lb 9.6 oz (90.5 kg)  07/07/16 198 lb (89.8 kg)     Past Medical History:  Diagnosis Date  . Allergy   . Colon polyp   . Diabetes mellitus without complication ([place])   . Dilated cardiomyopathy ([place])    non isch  . Gout   . Hemochromatosis   . Hypertension   . OSA (obstructive sleep apnea)      No Known Allergies    Current Outpatient Prescriptions on File Prior to Visit  Medication Sig Dispense Refill  . allopurinol (ZYLOPRIM) 300 MG tablet Take 1 tablet by mouth  daily 90 tablet 1  . aspirin 81 MG tablet Take 81 mg by mouth daily.      Marland Kitchen atorvastatin (LIPITOR) 20 MG tablet Take 1 tablet by mouth  daily 90  tablet 1  . bisoprolol-hydrochlorothiazide (ZIAC) 5-6.25 MG tablet Take 1 tablet by mouth  daily 90 tablet 1  . Cholecalciferol (VITAMIN D) 1000 UNITS capsule Take 5,000 Units by mouth daily.     . diphenhydramine-acetaminophen (TYLENOL PM) 25-500 MG TABS tablet Take 2 tablets by mouth at bedtime as needed.    . furosemide (LASIX) 20 MG tablet Take 1 tablet by mouth  every day for blood  pressure and fluid 90 tablet 1  . glipiZIDE (GLUCOTROL) 10 MG tablet Take 1 tablet (10 mg total) by mouth 3 (three) times daily with meals. (Patient taking differently: Take 10 mg by mouth 3 (three) times daily with meals. Takes 3 tablets three times a day) 90 tablet 1  . glucose blood test strip Use as instructed 100 each 6  . JANUVIA 100 MG tablet Take 1 tablet by mouth  daily 90 tablet 1  . Loratadine (CLARITIN PO) Take by mouth daily.      Marland Kitchen losartan (COZAAR) 100 MG tablet Take 1 tablet by mouth  daily 90 tablet 1  . magnesium oxide (MAG-OX) 400 MG tablet Take 400 mg by mouth daily.    . metFORMIN (GLUCOPHAGE-XR) 500 MG 24 hr tablet Take 2 tablets by mouth two times daily 360 tablet 1  . minoxidil (LONITEN) 10 MG tablet Take 1 tablet by  mouth  daily 90 tablet 1  . montelukast (SINGULAIR) 10 MG tablet TAKE 1 TABLET BY MOUTH  EVERY NIGHT AT BEDTIME 90 tablet 1  . ranitidine (ZANTAC) 300 MG tablet Take 1 tablet by mouth at  bedtime 90 tablet 1   No current facility-administered medications on file prior to visit.     ROS: all negative except above.   Physical Exam: Filed Weights   08/11/16 0926  Weight: 198 lb 3.2 oz (89.9 kg)   BP 118/72   Pulse 82   Temp 97.7 F (36.5 C)   Resp 16   Ht 5' 4.75" (1.645 m)   Wt 198 lb 3.2 oz (89.9 kg)   SpO2 97%   BMI 33.24 kg/m  General Appearance: Well nourished, in no apparent distress. Eyes: PERRLA, EOMs, conjunctiva no swelling or erythema Sinuses: No Frontal/maxillary tenderness ENT/Mouth: Ext aud canals clear, TMs without erythema, bulging. No erythema,  swelling, or exudate on post pharynx.  Tonsils not swollen or erythematous. Hearing normal.  Neck: Supple, thyroid normal.  Respiratory: Respiratory effort normal, BS equal bilaterally without rales, rhonchi, wheezing or stridor.  Cardio: RRR with no MRGs. Brisk peripheral pulses without edema.  Abdomen: Soft, + BS.  Non tender, no guarding, rebound, hernias, masses. Lymphatics: Non tender without lymphadenopathy.  Musculoskeletal: Full ROM, 5/5 strength, normal gait.  Skin: Warm, dry without rashes, lesions, ecchymosis.  Neuro: Cranial nerves intact. Normal muscle tone, no cerebellar symptoms. Sensation intact.  Psych: Awake and oriented X 3, normal affect, Insight and Judgment appropriate.     Vicie Mutters, PA-C 9:48 AM [facility]

## 2016-09-06 ENCOUNTER — Encounter (INDEPENDENT_AMBULATORY_CARE_PROVIDER_SITE_OTHER): Payer: Medicare Other | Admitting: Ophthalmology

## 2016-09-06 DIAGNOSIS — E11319 Type 2 diabetes mellitus with unspecified diabetic retinopathy without macular edema: Secondary | ICD-10-CM

## 2016-09-06 DIAGNOSIS — I10 Essential (primary) hypertension: Secondary | ICD-10-CM

## 2016-09-06 DIAGNOSIS — H35033 Hypertensive retinopathy, bilateral: Secondary | ICD-10-CM | POA: Diagnosis not present

## 2016-09-06 DIAGNOSIS — E113391 Type 2 diabetes mellitus with moderate nonproliferative diabetic retinopathy without macular edema, right eye: Secondary | ICD-10-CM

## 2016-09-06 DIAGNOSIS — E113292 Type 2 diabetes mellitus with mild nonproliferative diabetic retinopathy without macular edema, left eye: Secondary | ICD-10-CM

## 2016-09-06 DIAGNOSIS — H43813 Vitreous degeneration, bilateral: Secondary | ICD-10-CM

## 2016-09-06 LAB — HM DIABETES EYE EXAM

## 2016-09-24 ENCOUNTER — Encounter: Payer: Self-pay | Admitting: *Deleted

## 2016-09-27 ENCOUNTER — Other Ambulatory Visit: Payer: Self-pay | Admitting: Internal Medicine

## 2016-09-29 ENCOUNTER — Encounter: Payer: Self-pay | Admitting: Physician Assistant

## 2016-09-29 ENCOUNTER — Ambulatory Visit (INDEPENDENT_AMBULATORY_CARE_PROVIDER_SITE_OTHER): Payer: Medicare Other | Admitting: Physician Assistant

## 2016-09-29 ENCOUNTER — Other Ambulatory Visit: Payer: Self-pay | Admitting: Physician Assistant

## 2016-09-29 VITALS — BP 150/80 | HR 78 | Temp 97.3°F | Resp 16 | Ht 64.75 in | Wt 194.0 lb

## 2016-09-29 DIAGNOSIS — E559 Vitamin D deficiency, unspecified: Secondary | ICD-10-CM

## 2016-09-29 DIAGNOSIS — N182 Chronic kidney disease, stage 2 (mild): Secondary | ICD-10-CM | POA: Diagnosis not present

## 2016-09-29 DIAGNOSIS — I10 Essential (primary) hypertension: Secondary | ICD-10-CM

## 2016-09-29 DIAGNOSIS — M1000 Idiopathic gout, unspecified site: Secondary | ICD-10-CM | POA: Diagnosis not present

## 2016-09-29 DIAGNOSIS — R6889 Other general symptoms and signs: Secondary | ICD-10-CM

## 2016-09-29 DIAGNOSIS — G4733 Obstructive sleep apnea (adult) (pediatric): Secondary | ICD-10-CM

## 2016-09-29 DIAGNOSIS — E1122 Type 2 diabetes mellitus with diabetic chronic kidney disease: Secondary | ICD-10-CM | POA: Diagnosis not present

## 2016-09-29 DIAGNOSIS — I420 Dilated cardiomyopathy: Secondary | ICD-10-CM

## 2016-09-29 DIAGNOSIS — Z0001 Encounter for general adult medical examination with abnormal findings: Secondary | ICD-10-CM

## 2016-09-29 DIAGNOSIS — E782 Mixed hyperlipidemia: Secondary | ICD-10-CM | POA: Diagnosis not present

## 2016-09-29 DIAGNOSIS — Z0000 Encounter for general adult medical examination without abnormal findings: Secondary | ICD-10-CM

## 2016-09-29 DIAGNOSIS — Z79899 Other long term (current) drug therapy: Secondary | ICD-10-CM

## 2016-09-29 MED ORDER — BISOPROLOL-HYDROCHLOROTHIAZIDE 10-6.25 MG PO TABS: 1.0000 | ORAL_TABLET | Freq: Every day | ORAL | 1 refills | Status: DC

## 2016-09-29 NOTE — Progress Notes (Signed)
MEDICARE ANNUAL WELLNESS VISIT AND Follow up  Assessment:    Essential hypertension - increase ziac to 10mg  - continue medications, DASH diet, exercise and monitor at home. Call if greater than 130/80.  -     CBC with Differential/Platelet -     BASIC METABOLIC PANEL WITH GFR -     Hepatic function panel -     TSH  Type 2 diabetes mellitus with stage 2 chronic kidney disease, without long-term current use of insulin ([place]) Discussed general issues about diabetes pathophysiology and management., Educational material distributed., Suggested low cholesterol diet., Encouraged aerobic exercise., Discussed foot care., Reminded to get yearly retinal exam. If A1C is not down we will switch to insulin, discussed with patient, he understands -     Hemoglobin A1c  Hyperlipidemia -continue medications, check lipids, decrease fatty foods, increase activity.  -     Lipid panel  Hereditary hemochromatosis ([place]) Monitor CBC, continue follow up hematology  Dilated cardiomyopathy ([place]) Weight stable, continue follow up, Control blood pressure, cholesterol, glucose, increase exercise.   Vitamin D deficiency  Medication management -     Magnesium  Morbid Obesity with co morbidities - long discussion about weight loss, diet, and exercise  Idiopathic gout, unspecified chronicity, unspecified site Gout- recheck Uric acid as needed, Diet discussed, continue medications.   Over 40 minutes of exam, counseling, chart review and critical decision making was performed  Future Appointments Date Time Provider [place]  11/24/2016 9:00 AM [name], MD [facility] None  11/24/2016 11:00 AM [facility] None  03/24/2017 11:00 AM [facility] 3 [facility] None  07/25/2017 11:00 AM [facility] 3 [facility] None  07/25/2017 11:30 AM [name], MD [facility] None  09/07/2017 8:45 AM [name], MD [facility] None     Plan:   During the course of the visit  the patient was educated and counseled about appropriate screening and preventive services including:    Pneumococcal vaccine  Prevnar 13   Influenza vaccine  Td vaccine  Screening electrocardiogram  Bone densitometry screening  Colorectal cancer screening  Diabetes screening  Glaucoma screening  Nutrition counseling   Advanced directives: requested   Subjective:  Brett [name] is a 70 y.o. male who presents for Medicare Annual Wellness Visit and complete physical.     His blood pressure has been controlled at home, today their BP is BP: (!) 150/80 .  He follows with Dr. Ammie Dalton for hemochromatosis with periodic phlebotomies.  He does not workout. He denies chest pain, shortness of breath, dizziness. He has history of negative cath in 2006, echo in 2011 with [facility]. He is on cholesterol medication and denies myalgias. His cholesterol is at goal. The cholesterol last visit was:   Lab Results  Component Value Date   CHOL 125 07/07/2016   HDL 39 (L) 07/07/2016   LDLCALC 55 07/07/2016   TRIG 154 (H) 07/07/2016   CHOLHDL 3.2 07/07/2016   He has been working on diet and exercise for Diabetes with diabetic chronic kidney disease x 2006, he is on bASA, he is on ACE/ARB, he is on glipizide 10mg  TID, januiva 100mg , and metformin 2000 a day, sugars have been 140's, if it is not better will switch to in[place], no low sugars, and denies polydipsia, polyuria and visual disturbances. Last A1C was:  Lab Results  Component Value Date   HGBA1C 8.2 (H) 07/07/2016   Last GFR: Lab Results  Component Value Date   GFRNONAA 70 07/07/2016  Patient is on Vitamin D supplement.   Lab Results  Component Value Date   VD25OH 75 03/29/2016     BMI is Body mass index is 32.53 kg/m., he is working on diet and exercise. Wt Readings from Last 3 Encounters:  09/29/16 194 lb (88 kg)  08/11/16 198 lb 3.2 oz (89.9 kg)  07/26/16 199 lb 9.6 oz (90.5 kg)   Patient is on allopurinol for gout and  does not report a recent flare.  Lab Results  Component Value Date   LABURIC 5.0 03/29/2016    Medication Review: Current Outpatient Prescriptions on File Prior to Visit  Medication Sig Dispense Refill  . allopurinol (ZYLOPRIM) 300 MG tablet TAKE 1 TABLET BY MOUTH  DAILY 90 tablet 0  . aspirin 81 MG tablet Take 81 mg by mouth daily.      Marland Kitchen atorvastatin (LIPITOR) 20 MG tablet TAKE 1 TABLET BY MOUTH  DAILY 90 tablet 0  . bisoprolol-hydrochlorothiazide (ZIAC) 5-6.25 MG tablet Take 1 tablet by mouth  daily 90 tablet 1  . Cholecalciferol (VITAMIN D) 1000 UNITS capsule Take 5,000 Units by mouth daily.     . diphenhydramine-acetaminophen (TYLENOL PM) 25-500 MG TABS tablet Take 2 tablets by mouth at bedtime as needed.    . fluticasone (FLONASE) 50 MCG/ACT nasal spray USE 2 SPRAYS IN EACH  NOSTRIL DAILY 48 g 0  . furosemide (LASIX) 20 MG tablet TAKE 1 TABLET BY MOUTH  EVERY DAY FOR BLOOD  PRESSURE AND FLUID 90 tablet 0  . glipiZIDE (GLUCOTROL) 10 MG tablet Take 1 tablet (10 mg total) by mouth 3 (three) times daily with meals. (Patient taking differently: Take 10 mg by mouth 3 (three) times daily with meals. Takes 3 tablets three times a day) 90 tablet 1  . glucose blood test strip Use as instructed 100 each 6  . JANUVIA 100 MG tablet Take 1 tablet by mouth  daily 90 tablet 1  . Loratadine (CLARITIN PO) Take by mouth daily.      Marland Kitchen losartan (COZAAR) 100 MG tablet TAKE 1 TABLET BY MOUTH  DAILY 90 tablet 0  . magnesium oxide (MAG-OX) 400 MG tablet Take 400 mg by mouth daily.    . metFORMIN (GLUCOPHAGE-XR) 500 MG 24 hr tablet TAKE 2 TABLETS BY MOUTH TWO TIMES DAILY 360 tablet 0  . minoxidil (LONITEN) 10 MG tablet TAKE 1 TABLET BY MOUTH  DAILY 90 tablet 0  . montelukast (SINGULAIR) 10 MG tablet TAKE 1 TABLET BY MOUTH  EVERY NIGHT AT BEDTIME 90 tablet 1  . ranitidine (ZANTAC) 300 MG tablet TAKE 1 TABLET BY MOUTH AT  BEDTIME 90 tablet 0   No current facility-administered medications on file prior to  visit.     Allergies: No Known Allergies  Current Problems (verified) Patient Active Problem List   Diagnosis Date Noted  . Morbid obesity ([place]) 07/07/2016  . Encounter for Medicare annual wellness exam 09/17/2015  . BMI 33.0-33.9,adult 09/17/2015  . Hyperlipidemia 01/21/2014  . Vitamin D deficiency 01/21/2014  . Medication management 01/21/2014  . T2_NIDDM w/ CKD 2 ([place])   . Hypertension   . OSA (obstructive sleep apnea)   . Gout   . Dilated cardiomyopathy ([place])   . Hemochromatosis 09/27/2011    Screening Tests Immunization History  Administered Date(s) Administered  . DT 01/21/2014  . Influenza Split 09/11/2013  . Influenza,inj,Quad PF,36+ Mos 07/28/2014  . Influenza-Unspecified 08/21/2012  . Pneumococcal Conjugate-13 09/11/2014  . Pneumococcal-Unspecified 08/21/2012   Preventative care: Last colonoscopy: 07/2015  Echo 2011  Prior vaccinations: TD or Tdap: 2015  Influenza: 2017 Pneumococcal: 2013 Prevnar13: 2015 Shingles/Zostavax: declines due to cost  Names of Other Physician/Practitioners you currently use: 1. [facility] here for primary care 2. Dr. Zigmund Daniel, eye doctor, last visit 2017 dec 3. Dr. Marena Chancy, dentist, last visit 2015 Patient Care Team: [name], MD as PCP - General (Internal Medicine) [name], MD as Consulting Physician (Oncology) Lelon Perla, MD as Consulting Physician (Cardiology) [name], MD as Consulting Physician (Ophthalmology)  Surgical: Past Surgical History:  Procedure Laterality Date  . EYE SURGERY Bilateral    Cataract   Family His family history includes Cancer in his maternal uncle; Diabetes in his father and other; Heart disease in his other; Hyperlipidemia in his other; Hypertension in his other. Social history  He reports that he quit smoking about 49 years ago. He has never used smokeless tobacco. He reports that he drinks alcohol. He reports that he does  not use drugs.  MEDICARE WELLNESS OBJECTIVES: Physical activity:   Cardiac risk factors:   Depression/mood screen:   Depression screen [facility] 2/9 03/29/2016  Decreased Interest 0  Down, Depressed, Hopeless 0  PHQ - 2 Score 0    ADLs:  In your present state of health, do you have any difficulty performing the following activities: 03/29/2016  Hearing? N  Vision? N  Difficulty concentrating or making decisions? N  Walking or climbing stairs? N  Dressing or bathing? N  Doing errands, shopping? N  Some recent data might be hidden    Cognitive Testing  Alert? Yes  Normal Appearance?Yes  Oriented to person? Yes  Place? Yes   Time? Yes  Recall of three objects?  Yes  Can perform simple calculations? Yes  Displays appropriate judgment?Yes  Can read the correct time from a watch face?Yes  EOL planning: Does Patient Have a Medical Advance Directive?: Yes Type of Advance Directive: Healthcare Power of Attorney, Living will Copy of [place] in Chart?: No - copy requested  Review of Systems  Constitutional: Negative.   HENT: Negative.   Eyes: Negative.   Respiratory: Negative.   Cardiovascular: Negative.   Gastrointestinal: Negative.   Genitourinary: Negative.   Musculoskeletal: Negative.   Skin: Negative.   Neurological: Negative.   Endo/Heme/Allergies: Negative.   Psychiatric/Behavioral: Negative.      Objective:     Today's Vitals   09/29/16 1101  BP: (!) 150/80  Pulse: 78  Resp: 16  Temp: 97.3 F (36.3 C)  SpO2: 98%  Weight: 194 lb (88 kg)  Height: 5' 4.75" (1.645 m)  PainSc: 0-No pain   Body mass index is 32.53 kg/m.  General appearance: alert, no distress, WD/WN, male HEENT: normocephalic, sclerae anicteric, TMs pearly, nares patent, no discharge or erythema, pharynx normal Oral cavity: MMM, no lesions Neck: supple, no lymphadenopathy, no thyromegaly, no masses Heart: RRR, normal S1, S2, no murmurs Lungs: CTA bilaterally, no wheezes,  rhonchi, or rales Abdomen: +bs, soft, non tender, non distended, no masses, no hepatomegaly, no splenomegaly Musculoskeletal: nontender, no swelling, no obvious deformity Extremities: no edema, no cyanosis, no clubbing Pulses: 2+ symmetric, upper and lower extremities, normal cap refill Neurological: alert, oriented x 3, CN2-12 intact, strength normal upper extremities and lower extremities, sensation normal throughout, DTRs 2+ throughout, no cerebellar signs, gait Normal Psychiatric: normal affect, behavior normal, pleasant   Medicare Attestation I have personally reviewed: The patient's medical and social history Their use of alcohol, tobacco  or illicit drugs Their current medications and supplements The patient's functional ability including ADLs,fall risks, home safety risks, cognitive, and hearing and visual impairment Diet and physical activities Evidence for depression or mood disorders  The patient's weight, height, BMI, and visual acuity have been recorded in the chart.  I have made referrals, counseling, and provided education to the patient based on review of the above and I have provided the patient with a written personalized care plan for preventive services.     Vicie Mutters, PA-C   09/29/2016

## 2016-09-29 NOTE — Patient Instructions (Signed)
We are increasing the ziac to 10mg  daily from 5 mg daily Monitor your blood pressure at home. Go to the ER if any CP, SOB, nausea, dizziness, severe HA, changes vision/speech  Goal BP:  For patients younger than 60: Goal BP < 140/90. For patients 60 and older: Goal BP < 150/90. For patients with diabetes: Goal BP < 140/90. Your most recent BP: BP: (!) 150/80   Take your medications faithfully as instructed. Maintain a healthy weight. Get at least 150 minutes of aerobic exercise per week. Minimize salt intake. Minimize alcohol intake  DASH Eating Plan DASH stands for "Dietary Approaches to Stop Hypertension." The DASH eating plan is a healthy eating plan that has been shown to reduce high blood pressure (hypertension). Additional health benefits may include reducing the risk of type 2 diabetes mellitus, heart disease, and stroke. The DASH eating plan may also help with weight loss. WHAT DO I NEED TO KNOW ABOUT THE DASH EATING PLAN? For the DASH eating plan, you will follow these general guidelines:  Choose foods with a percent daily value for sodium of less than 5% (as listed on the food label).  Use salt-free seasonings or herbs instead of table salt or sea salt.  Check with your health care provider or pharmacist before using salt substitutes.  Eat lower-sodium products, often labeled as "lower sodium" or "no salt added."  Eat fresh foods.  Eat more vegetables, fruits, and low-fat dairy products.  Choose whole grains. Look for the word "whole" as the first word in the ingredient list.  Choose fish and skinless chicken or [place] more often than red meat. Limit fish, poultry, and meat to 6 oz (170 g) each day.  Limit sweets, desserts, sugars, and sugary drinks.  Choose heart-healthy fats.  Limit cheese to 1 oz (28 g) per day.  Eat more home-cooked food and less restaurant, buffet, and fast food.  Limit fried foods.  Cook foods using methods other than frying.  Limit  canned vegetables. If you do use them, rinse them well to decrease the sodium.  When eating at a restaurant, ask that your food be prepared with less salt, or no salt if possible. WHAT FOODS CAN I EAT? Seek help from a dietitian for individual calorie needs. Grains Whole grain or whole wheat bread. Brown rice. Whole grain or whole wheat pasta. Quinoa, bulgur, and whole grain cereals. Low-sodium cereals. Corn or whole wheat flour tortillas. Whole grain cornbread. Whole grain crackers. Low-sodium crackers. Vegetables Fresh or frozen vegetables (raw, steamed, roasted, or grilled). Low-sodium or reduced-sodium tomato and vegetable juices. Low-sodium or reduced-sodium tomato sauce and paste. Low-sodium or reduced-sodium canned vegetables.  Fruits All fresh, canned (in natural juice), or frozen fruits. Meat and Other Protein Products Ground beef (85% or leaner), grass-fed beef, or beef trimmed of fat. Skinless chicken or [place]. Ground chicken or [place]. Pork trimmed of fat. All fish and seafood. Eggs. Dried beans, peas, or lentils. Unsalted nuts and seeds. Unsalted canned beans. Dairy Low-fat dairy products, such as skim or 1% milk, 2% or reduced-fat cheeses, low-fat ricotta or cottage cheese, or plain low-fat yogurt. Low-sodium or reduced-sodium cheeses. Fats and Oils Tub margarines without trans fats. Light or reduced-fat mayonnaise and salad dressings (reduced sodium). Avocado. Safflower, olive, or canola oils. Natural peanut or almond butter. Other Unsalted popcorn and pretzels. The items listed above may not be a complete list of recommended foods or beverages. Contact your dietitian for more options. WHAT FOODS ARE NOT RECOMMENDED? Grains White bread.  White pasta. White rice. Refined cornbread. Bagels and croissants. Crackers that contain trans fat. Vegetables Creamed or fried vegetables. Vegetables in a cheese sauce. Regular canned vegetables. Regular canned tomato sauce and paste. Regular  tomato and vegetable juices. Fruits Dried fruits. Canned fruit in light or heavy syrup. Fruit juice. Meat and Other Protein Products Fatty cuts of meat. Ribs, chicken wings, bacon, sausage, bologna, salami, chitterlings, fatback, hot dogs, bratwurst, and packaged luncheon meats. Salted nuts and seeds. Canned beans with salt. Dairy Whole or 2% milk, cream, half-and-half, and cream cheese. Whole-fat or sweetened yogurt. Full-fat cheeses or blue cheese. Nondairy creamers and whipped toppings. Processed cheese, cheese spreads, or cheese curds. Condiments Onion and garlic salt, seasoned salt, table salt, and sea salt. Canned and packaged gravies. Worcestershire sauce. Tartar sauce. Barbecue sauce. Teriyaki sauce. Soy sauce, including reduced sodium. Steak sauce. Fish sauce. Oyster sauce. Cocktail sauce. Horseradish. Ketchup and mustard. Meat flavorings and tenderizers. Bouillon cubes. Hot sauce. Tabasco sauce. Marinades. Taco seasonings. Relishes. Fats and Oils Butter, stick margarine, lard, shortening, ghee, and bacon fat. Coconut, palm kernel, or palm oils. Regular salad dressings. Other Pickles and olives. Salted popcorn and pretzels. The items listed above may not be a complete list of foods and beverages to avoid. Contact your dietitian for more information. WHERE CAN I FIND MORE INFORMATION? National Heart, Lung, and Blood Institute: travelstabloid.com Document Released: 09/29/2011 Document Revised: 02/24/2014 Document Reviewed: 08/14/2013 [facility] Patient Information 2015 [place], [place]. This information is not intended to replace advice given to you by your health care provider. Make sure you discuss any questions you have with your health care provider.  Recommendations For Diabetic/Prediabetic Patients:   -  Take medications as prescribed  -  Recommend Dr Fara Olden Fuhrman's book "The End of Diabetes "  And "The End of Dieting"- Can get at  www.[place].com and  encourage also get the Audio CD book  - AVOID Animal products, ie. Meat - red/white, Poultry and Dairy/especially cheese - Exercise at least 5 times a week for 30 minutes or preferably daily.  - No Smoking - Drink less than 2 drinks a day.  - Monitor your feet for sores - Have yearly Eye Exams - Recommend annual Flu vaccine  - Recommend Pneumovax and Prevnar vaccines - Shingles Vaccine (Zostavax) if over 74 y.o.  Goals:   - BMI less than 24 - Fasting sugar less than 130 or less than 150 if tapering medicines to lose weight  - Systolic BP less than [id number]  - Diastolic BP less than 80 - Bad LDL Cholesterol less than 70 - Triglycerides less than 150

## 2016-09-30 LAB — BASIC METABOLIC PANEL WITH GFR
BUN: 16 mg/dL (ref 7–25)
CALCIUM: 9.7 mg/dL (ref 8.6–10.3)
CO2: 29 mmol/L (ref 20–31)
Chloride: 101 mmol/L (ref 98–110)
Creat: 1.06 mg/dL (ref 0.70–1.18)
GFR, EST AFRICAN AMERICAN: 82 mL/min (ref >=60)
GFR, Est Non African American: 71 mL/min (ref >=60)
GLUCOSE: 307 mg/dL — AB (ref 65–99)
Potassium: 3.9 mmol/L (ref 3.5–5.3)
Sodium: 139 mmol/L (ref 135–146)

## 2016-09-30 LAB — LIPID PANEL
CHOL/HDL RATIO: 3.3 ratio (ref <5.0)
CHOLESTEROL: 131 mg/dL (ref <200)
HDL: 40 mg/dL — ABNORMAL LOW (ref >40)
LDL CALC: 70 mg/dL (ref <100)
Triglycerides: 106 mg/dL (ref <150)
VLDL: 21 mg/dL (ref <30)

## 2016-09-30 LAB — CBC WITH DIFFERENTIAL/PLATELET
BASOS ABS: 0 {cells}/uL (ref 0–200)
Basophils Relative: 0 %
EOS ABS: 138 {cells}/uL (ref 15–500)
EOS PCT: 2 %
HCT: 44.5 % (ref 38.5–50.0)
HEMOGLOBIN: 15.5 g/dL (ref 13.2–17.1)
LYMPHS ABS: 1449 {cells}/uL (ref 850–3900)
Lymphocytes Relative: 21 %
MCH: 33 pg (ref 27.0–33.0)
MCHC: 34.8 g/dL (ref 32.0–36.0)
MCV: 94.9 fL (ref 80.0–100.0)
MONOS PCT: 7 %
MPV: 9.4 fL (ref 7.5–12.5)
Monocytes Absolute: 483 cells/uL (ref 200–950)
NEUTROS PCT: 70 %
Neutro Abs: 4830 cells/uL (ref 1500–7800)
Platelets: 179 10*3/uL (ref 140–400)
RBC: 4.69 MIL/uL (ref 4.20–5.80)
RDW: 13.9 % (ref 11.0–15.0)
WBC: 6.9 10*3/uL (ref 3.8–10.8)

## 2016-09-30 LAB — HEPATIC FUNCTION PANEL
ALT: 19 U/L (ref 9–46)
AST: 14 U/L (ref 10–35)
Albumin: 4.7 g/dL (ref 3.6–5.1)
Alkaline Phosphatase: 34 U/L — ABNORMAL LOW (ref 40–115)
BILIRUBIN DIRECT: 0.2 mg/dL (ref <=0.2)
BILIRUBIN TOTAL: 0.9 mg/dL (ref 0.2–1.2)
Indirect Bilirubin: 0.7 mg/dL (ref 0.2–1.2)
Total Protein: 7.1 g/dL (ref 6.1–8.1)

## 2016-09-30 LAB — MAGNESIUM: MAGNESIUM: 1.8 mg/dL (ref 1.5–2.5)

## 2016-09-30 LAB — HEMOGLOBIN A1C
Hgb A1c MFr Bld: 7.9 % — ABNORMAL HIGH (ref <5.7)
Mean Plasma Glucose: 180 mg/dL

## 2016-09-30 LAB — TSH: TSH: 2.58 m[IU]/L (ref 0.40–4.50)

## 2016-10-03 DIAGNOSIS — E113292 Type 2 diabetes mellitus with mild nonproliferative diabetic retinopathy without macular edema, left eye: Secondary | ICD-10-CM | POA: Diagnosis not present

## 2016-10-03 DIAGNOSIS — E113391 Type 2 diabetes mellitus with moderate nonproliferative diabetic retinopathy without macular edema, right eye: Secondary | ICD-10-CM | POA: Diagnosis not present

## 2016-10-03 DIAGNOSIS — H35033 Hypertensive retinopathy, bilateral: Secondary | ICD-10-CM | POA: Diagnosis not present

## 2016-10-03 DIAGNOSIS — H35371 Puckering of macula, right eye: Secondary | ICD-10-CM | POA: Diagnosis not present

## 2016-10-03 DIAGNOSIS — H25011 Cortical age-related cataract, right eye: Secondary | ICD-10-CM | POA: Diagnosis not present

## 2016-10-03 DIAGNOSIS — H2511 Age-related nuclear cataract, right eye: Secondary | ICD-10-CM | POA: Diagnosis not present

## 2016-10-03 DIAGNOSIS — H35372 Puckering of macula, left eye: Secondary | ICD-10-CM | POA: Diagnosis not present

## 2016-10-04 ENCOUNTER — Telehealth: Payer: Self-pay

## 2016-10-04 NOTE — Telephone Encounter (Signed)
Pt aware of lab results & voiced understanding of results.

## 2016-10-06 ENCOUNTER — Other Ambulatory Visit: Payer: Self-pay

## 2016-10-06 MED ORDER — SITAGLIPTIN PHOSPHATE 100 MG PO TABS: 100.0000 mg | ORAL_TABLET | Freq: Every day | ORAL | 1 refills | Status: DC

## 2016-11-01 ENCOUNTER — Other Ambulatory Visit: Payer: Self-pay | Admitting: Physician Assistant

## 2016-11-08 DIAGNOSIS — H2511 Age-related nuclear cataract, right eye: Secondary | ICD-10-CM | POA: Diagnosis not present

## 2016-11-08 DIAGNOSIS — H25011 Cortical age-related cataract, right eye: Secondary | ICD-10-CM | POA: Diagnosis not present

## 2016-11-24 ENCOUNTER — Encounter: Payer: Self-pay | Admitting: Internal Medicine

## 2016-11-24 ENCOUNTER — Other Ambulatory Visit (HOSPITAL_BASED_OUTPATIENT_CLINIC_OR_DEPARTMENT_OTHER): Payer: Medicare Other

## 2016-11-24 LAB — FERRITIN: Ferritin: 73 ng/mL (ref 22–316)

## 2016-11-25 ENCOUNTER — Telehealth: Payer: Self-pay | Admitting: *Deleted

## 2016-11-25 NOTE — Telephone Encounter (Signed)
Patient notified per order of Dr. Benay Spice that Ferritin is stable and to f/u as scheduled for lab in June.  Patient appreciative of call and has no questions at this time.

## 2016-11-25 NOTE — Telephone Encounter (Signed)
-----   Message from [name], MD sent at 11/24/2016  8:39 PM EST ----- Please call patient, ferritin is stable, f/u as scheduled for lab in June

## 2016-12-20 ENCOUNTER — Other Ambulatory Visit: Payer: Self-pay | Admitting: Internal Medicine

## 2017-01-18 ENCOUNTER — Encounter: Payer: Self-pay | Admitting: Internal Medicine

## 2017-01-18 ENCOUNTER — Ambulatory Visit (INDEPENDENT_AMBULATORY_CARE_PROVIDER_SITE_OTHER): Payer: Medicare Other | Admitting: Internal Medicine

## 2017-01-18 VITALS — BP 140/80 | HR 64 | Temp 97.5°F | Resp 16 | Ht 65.5 in | Wt 195.6 lb

## 2017-01-18 DIAGNOSIS — E559 Vitamin D deficiency, unspecified: Secondary | ICD-10-CM

## 2017-01-18 DIAGNOSIS — Z136 Encounter for screening for cardiovascular disorders: Secondary | ICD-10-CM

## 2017-01-18 DIAGNOSIS — E782 Mixed hyperlipidemia: Secondary | ICD-10-CM | POA: Diagnosis not present

## 2017-01-18 DIAGNOSIS — N320 Bladder-neck obstruction: Secondary | ICD-10-CM | POA: Diagnosis not present

## 2017-01-18 DIAGNOSIS — M1000 Idiopathic gout, unspecified site: Secondary | ICD-10-CM | POA: Diagnosis not present

## 2017-01-18 DIAGNOSIS — E1122 Type 2 diabetes mellitus with diabetic chronic kidney disease: Secondary | ICD-10-CM | POA: Diagnosis not present

## 2017-01-18 DIAGNOSIS — I10 Essential (primary) hypertension: Secondary | ICD-10-CM

## 2017-01-18 DIAGNOSIS — Z1212 Encounter for screening for malignant neoplasm of rectum: Secondary | ICD-10-CM

## 2017-01-18 DIAGNOSIS — Z79899 Other long term (current) drug therapy: Secondary | ICD-10-CM

## 2017-01-18 DIAGNOSIS — N182 Chronic kidney disease, stage 2 (mild): Secondary | ICD-10-CM | POA: Diagnosis not present

## 2017-01-18 DIAGNOSIS — I420 Dilated cardiomyopathy: Secondary | ICD-10-CM

## 2017-01-18 DIAGNOSIS — G4733 Obstructive sleep apnea (adult) (pediatric): Secondary | ICD-10-CM

## 2017-01-18 DIAGNOSIS — Z125 Encounter for screening for malignant neoplasm of prostate: Secondary | ICD-10-CM | POA: Diagnosis not present

## 2017-01-18 LAB — CBC WITH DIFFERENTIAL/PLATELET
Basophils Absolute: 61 cells/uL (ref 0–200)
Basophils Relative: 1 %
EOS ABS: 183 {cells}/uL (ref 15–500)
EOS PCT: 3 %
HCT: 44 % (ref 38.5–50.0)
HEMOGLOBIN: 15.3 g/dL (ref 13.2–17.1)
LYMPHS ABS: 1525 {cells}/uL (ref 850–3900)
Lymphocytes Relative: 25 %
MCH: 32.7 pg (ref 27.0–33.0)
MCHC: 34.8 g/dL (ref 32.0–36.0)
MCV: 94 fL (ref 80.0–100.0)
MPV: 9.4 fL (ref 7.5–12.5)
Monocytes Absolute: 549 cells/uL (ref 200–950)
Monocytes Relative: 9 %
NEUTROS ABS: 3782 {cells}/uL (ref 1500–7800)
Neutrophils Relative %: 62 %
Platelets: 184 10*3/uL (ref 140–400)
RBC: 4.68 MIL/uL (ref 4.20–5.80)
RDW: 13.8 % (ref 11.0–15.0)
WBC: 6.1 10*3/uL (ref 3.8–10.8)

## 2017-01-18 LAB — LIPID PANEL
Cholesterol: 124 mg/dL (ref <200)
HDL: 37 mg/dL — ABNORMAL LOW (ref >40)
LDL CALC: 61 mg/dL (ref <100)
Total CHOL/HDL Ratio: 3.4 Ratio (ref <5.0)
Triglycerides: 132 mg/dL (ref <150)
VLDL: 26 mg/dL (ref <30)

## 2017-01-18 LAB — HEPATIC FUNCTION PANEL
ALK PHOS: 35 U/L — AB (ref 40–115)
ALT: 22 U/L (ref 9–46)
AST: 14 U/L (ref 10–35)
Albumin: 4.5 g/dL (ref 3.6–5.1)
BILIRUBIN DIRECT: 0.2 mg/dL (ref <=0.2)
BILIRUBIN INDIRECT: 0.6 mg/dL (ref 0.2–1.2)
Total Bilirubin: 0.8 mg/dL (ref 0.2–1.2)
Total Protein: 6.6 g/dL (ref 6.1–8.1)

## 2017-01-18 LAB — BASIC METABOLIC PANEL WITH GFR
BUN: 16 mg/dL (ref 7–25)
CHLORIDE: 99 mmol/L (ref 98–110)
CO2: 29 mmol/L (ref 20–31)
CREATININE: 1.02 mg/dL (ref 0.70–1.18)
Calcium: 9.8 mg/dL (ref 8.6–10.3)
GFR, Est African American: 86 mL/min (ref >=60)
GFR, Est Non African American: 74 mL/min (ref >=60)
Glucose, Bld: 328 mg/dL — ABNORMAL HIGH (ref 65–99)
Potassium: 3.9 mmol/L (ref 3.5–5.3)
SODIUM: 139 mmol/L (ref 135–146)

## 2017-01-18 LAB — PSA: PSA: 0.4 ng/mL (ref <=4.0)

## 2017-01-18 NOTE — Progress Notes (Signed)
[facility]   Brett [name], M.D.    Brett [name], P.A.-C      Brett [name], P.A.-C  Brett [name]                [address] [place] [address] Telephone [phone] Telefax [phone] Comprehensive Evaluation & Examination     This very nice 71 y.o. MWM of [place] ancestry in [place] since 1960 presents for a  comprehensive evaluation and management of multiple medical co-morbidities.  Patient has been followed for HTN, T2_NIDDM  Prediabetes, Hyperlipidemia and Vitamin D Deficiency.     Patient has  Hereditary Hemochromatosis and is followed by Dr Benay Spice for periodic phlebotomies.     HTN predates since 81. Patient's BP has been controlled at home.  He had a negative heart cath in 2006, but was dx/d with a nonischemic cardiomyopathy. Today's BP is upper limits of normal - 140/80. Patient denies any cardiac symptoms as chest pain, palpitations, shortness of breath, dizziness or ankle swelling.     Patient's hyperlipidemia is controlled with diet and medications. Patient denies myalgias or other medication SE's. 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  Dietary compliance has been poor and last A1c was not at goal: Lab Results  Component Value Date   HGBA1C 7.9 (H) 09/29/2016       Finally, patient has history of Vitamin D Deficiency and last vitamin D was at goal: Lab Results  Component Value Date   VD25OH 75 03/29/2016   Current Outpatient Prescriptions on File Prior to Visit  Medication Sig  . allopurinol 300 MG TAKE 1 TAB  DAILY  . aspirin 81 MG  Take   daily.    Marland Kitchen atorvastatin  20 MG TAKE 1 TAB  DAILY  . bisoprolol-hctz 5-6.25  TAKE 1 TAB  DAILY  . VITAMIN D 1000 UNITS Take 5,000 Units  daily.   . TYLENOL PM 25-500 MG  Take 2 tabat bedtime as needed.  . furosemide  20 MG TAKE 1 TAB  EVERY DAY  . glipiZIDE  10 MG TAKE 1 TAB 3  TIMES A DAY WITH MEALS  . Loratadine 10 mg Take daily.    Marland Kitchen losartan  100 MG TAKE 1 TAB  DAILY  . magnesium 400 MG Take daily.  . metFORMIN-XR 500 MG TAKE 2 TAB TWO TIMES DAILY  . minoxidil  10 MG TAKE 1 TAB DAILY  . montelukast  10 MG TAKE 1 TAB  EVERY NIGHT AT BEDTIME  . ranitidine  300 MG TAKE 1 TAB AT  BEDTIME  . sitaGLIPtin (JANUVIA) 100 MG  Take 1 tab daily. - takes 1/2 tab   No Known Allergies  Past Medical History:  Diagnosis Date  . Allergy   . Colon polyp   . Diabetes mellitus without complication ([place])   . Dilated cardiomyopathy ([place])    non isch  . Gout   . Hemochromatosis   . Hypertension   . OSA (obstructive sleep apnea)    Health Maintenance  Topic Date Due  . Hepatitis C Screening  1946/10/09  . HEMOGLOBIN A1C  03/30/2017  . OPHTHALMOLOGY EXAM  09/06/2017  . FOOT EXAM  09/29/2017  . TETANUS/TDAP  01/22/2024  . COLONOSCOPY  08/24/2025  . INFLUENZA VACCINE  Completed  . PNA vac Low Risk Adult  Completed   Immunization History  Administered Date(s) Administered  . DT 01/21/2014  . Influenza Split 09/11/2013  . Influenza,inj,Quad PF,36+ Mos 07/28/2014  . Influenza-Unspecified 08/21/2012  . Pneumococcal Conjugate-13 09/11/2014  . Pneumococcal-Unspecified 08/21/2012   Past Surgical History:  Procedure Laterality Date  . EYE [name] Bilateral    Cataract   Family History  Problem Relation Age of Onset  . Diabetes Father   . Diabetes Other   . Hypertension Other   . Hyperlipidemia Other   . Heart disease Other   . Cancer Maternal Uncle     brain   Social History   Social History  . Marital status: Single    Spouse name: N/A  . Number of children: N/A  . Years of  education: N/A   Occupational History  . Retired Administrator   Social History Main Topics  . Smoking status: Former Smoker    Quit date: 11/24/1966  . Smokeless tobacco: Never Used  . Alcohol use Yes, Occasionally  . Drug use: No  . Sexual activity: Not active    ROS Constitutional: Denies fever, chills, weight loss/gain, headaches, insomnia,  night sweats or change in appetite. Does c/o fatigue. Eyes: Denies redness, blurred vision, diplopia, discharge, itchy or watery eyes.  ENT: Denies discharge, congestion, post nasal drip, epistaxis, sore throat, earache, hearing loss, dental pain, Tinnitus, Vertigo, Sinus pain or snoring.  Cardio: Denies chest pain, palpitations, irregular heartbeat, syncope, dyspnea, diaphoresis, orthopnea, PND, claudication or edema Respiratory: denies cough, dyspnea, DOE, pleurisy, hoarseness, laryngitis or wheezing.  Gastrointestinal: Denies dysphagia, heartburn, reflux, water brash, pain, cramps, nausea, vomiting, bloating, diarrhea, constipation, hematemesis, melena, hematochezia, jaundice or hemorrhoids Genitourinary: Denies dysuria, frequency, urgency, nocturia, hesitancy, discharge, hematuria or flank pain Musculoskeletal: Denies arthralgia, myalgia, stiffness, Jt. Swelling, pain, limp or strain/sprain. Denies Falls. Skin: Denies puritis, rash, hives, warts, acne, eczema or change in skin lesion Neuro: No weakness, tremor, incoordination, spasms, paresthesia or pain Psychiatric: Denies confusion, memory loss or sensory loss. Denies Depression. Endocrine: Denies change in weight, skin, hair change, nocturia, and paresthesia, diabetic polys, visual blurring or hyper / hypo glycemic episodes.  Heme/Lymph: No excessive bleeding, bruising or enlarged lymph nodes.  Physical Exam  BP 140/80   P 64   T 97.5 F    R 16   Ht 5' 5.5"    Wt 195 lb 9.6 oz    BMI 32.05   General Appearance: Over nourished, well- groomed & in no apparent distress.  Eyes:  PERRLA, EOMs, conjunctiva no swelling or erythema, normal fundi and vessels. Sinuses: No frontal/maxillary tenderness ENT/Mouth: EACs patent / TMs  nl. Nares clear without erythema, swelling, mucoid exudates. Oral hygiene is good. No erythema, swelling, or exudate. Tongue normal, non-obstructing. Tonsils not swollen or erythematous. Hearing normal.  Neck: Supple, thyroid normal. No bruits, nodes or JVD. Respiratory: Respiratory effort normal.  BS equal and clear bilateral without rales, rhonci, wheezing or stridor. Cardio: Heart sounds are normal with regular rate and rhythm and no murmurs, rubs or gallops. Peripheral pulses are normal and equal bilaterally without edema. No aortic or femoral bruits. Chest: symmetric with normal excursions and percussion.  Abdomen: Soft, with Nl bowel sounds. Nontender, no guarding,  rebound, hernias, masses, or organomegaly.  Lymphatics: Non tender without lymphadenopathy.  Genitourinary: No hernias.Testes nl. DRE - prostate nl for age - smooth & firm w/o nodules. Musculoskeletal: Full ROM all peripheral extremities, joint stability, 5/5 strength, and normal gait. Skin: Warm and dry without rashes, lesions, cyanosis, clubbing or  ecchymosis.  Neuro: Cranial nerves intact, reflexes equal bilaterally. Normal muscle tone, no cerebellar symptoms. Sensation intact to touch, Vibratory and Monofilament to the toes bilaterally.  Pysch: Alert and oriented X 3 with normal affect, insight and judgment appropriate.   Assessment and Plan  1. Essential hypertension  - EKG 12-Lead - [place], RETROPERITNL ABD,  LTD - Urinalysis, Routine w reflex microscopic - Microalbumin / creatinine urine ratio - CBC with Differential/Platelet - BASIC METABOLIC PANEL WITH GFR - Magnesium - TSH  2. Mixed hyperlipidemia  - EKG 12-Lead - [place], RETROPERITNL ABD,  LTD - Hepatic function panel - Lipid panel - TSH  3. 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 Vitamin D deficiency  - VITAMIN D 25 Hydroxy   5. Hereditary hemochromatosis ([place])   6. OSA (obstructive sleep apnea)   7. Dilated cardiomyopathy ([place])  - EKG 12-Lead  8. Idiopathic gout  - Uric acid  9. Screening for rectal cancer  - POC Hemoccult Bld/Stl  10. Prostate cancer screening  - PSA  11. Bladder neck obstruction  - PSA  12. Screening for ischemic heart disease  - EKG 12-Lead  13. Screening for AAA (aortic abdominal aneurysm)  - [place], RETROPERITNL ABD,  LTD  14. Medication management  - Urinalysis, Routine w reflex microscopic - Uric acid - CBC with Differential/Platelet - BASIC METABOLIC PANEL WITH GFR - Hepatic function panel - Magnesium - Lipid panel - TSH - Hemoglobin A1c - Insulin, random - VITAMIN D 25 Hydroxy       Continue prudent diet as discussed, weight control, BP monitoring, regular exercise, and medications as discussed.  Discussed med effects and SE's. Routine screening labs and tests as requested with regular follow-up as recommended. Over 40 minutes of exam, counseling, chart review and high complex critical decision making was performed

## 2017-01-18 NOTE — Patient Instructions (Signed)

## 2017-01-19 LAB — TSH: TSH: 3.8 m[IU]/L (ref 0.40–4.50)

## 2017-01-19 LAB — URINALYSIS, MICROSCOPIC ONLY
BACTERIA UA: NONE SEEN [HPF]
CASTS: NONE SEEN [LPF]
CRYSTALS: NONE SEEN [HPF]
RBC / HPF: NONE SEEN RBC/HPF (ref <=2)
Squamous Epithelial / LPF: NONE SEEN [HPF] (ref <=5)
WBC, UA: NONE SEEN WBC/HPF (ref <=5)
YEAST: NONE SEEN [HPF]

## 2017-01-19 LAB — URINALYSIS, ROUTINE W REFLEX MICROSCOPIC
Bilirubin Urine: NEGATIVE
HGB URINE DIPSTICK: NEGATIVE
Ketones, ur: NEGATIVE
LEUKOCYTES UA: NEGATIVE
NITRITE: NEGATIVE
PH: 5 (ref 5.0–8.0)
Protein, ur: NEGATIVE
SPECIFIC GRAVITY, URINE: 1.016 (ref 1.001–1.035)

## 2017-01-19 LAB — HEMOGLOBIN A1C
HEMOGLOBIN A1C: 8.2 % — AB (ref <5.7)
Mean Plasma Glucose: 189 mg/dL

## 2017-01-19 LAB — URIC ACID: URIC ACID, SERUM: 4.8 mg/dL (ref 4.0–8.0)

## 2017-01-19 LAB — MICROALBUMIN / CREATININE URINE RATIO
CREATININE, URINE: 70 mg/dL (ref 20–370)
Microalb Creat Ratio: 9 mcg/mg creat (ref <30)
Microalb, Ur: 0.6 mg/dL

## 2017-01-19 LAB — MAGNESIUM: MAGNESIUM: 1.9 mg/dL (ref 1.5–2.5)

## 2017-01-19 LAB — INSULIN, RANDOM: Insulin: 6.4 u[IU]/mL (ref 2.0–19.6)

## 2017-01-19 LAB — VITAMIN D 25 HYDROXY (VIT D DEFICIENCY, FRACTURES): Vit D, 25-Hydroxy: 68 ng/mL (ref 30–100)

## 2017-02-15 ENCOUNTER — Other Ambulatory Visit: Payer: Self-pay

## 2017-02-15 DIAGNOSIS — Z1212 Encounter for screening for malignant neoplasm of rectum: Secondary | ICD-10-CM

## 2017-02-15 LAB — POC HEMOCCULT BLD/STL (HOME/3-CARD/SCREEN)
Card #2 Fecal Occult Blod, POC: NEGATIVE
FECAL OCCULT BLD: NEGATIVE
Fecal Occult Blood, POC: NEGATIVE

## 2017-02-27 ENCOUNTER — Other Ambulatory Visit: Payer: Self-pay | Admitting: Physician Assistant

## 2017-03-07 ENCOUNTER — Encounter: Payer: Self-pay | Admitting: *Deleted

## 2017-03-16 ENCOUNTER — Encounter: Payer: Self-pay | Admitting: *Deleted

## 2017-03-24 ENCOUNTER — Other Ambulatory Visit (HOSPITAL_BASED_OUTPATIENT_CLINIC_OR_DEPARTMENT_OTHER): Payer: Medicare Other

## 2017-03-24 LAB — FERRITIN: FERRITIN: 85 ng/mL (ref 22–316)

## 2017-03-27 ENCOUNTER — Telehealth: Payer: Self-pay | Admitting: *Deleted

## 2017-03-27 NOTE — Telephone Encounter (Signed)
-----   Message from [name], MD sent at 03/24/2017  5:11 PM EDT ----- Please call patient, ferritin slightly higher,but ok to follow for now, f/u as scheduled

## 2017-03-27 NOTE — Telephone Encounter (Signed)
Call placed to patient to notify him that ferritin is slightly higher, but ok to follow for now and to f/u as scheduled per order of Dr. Benay Spice.  Patient appreciative of call and has no questions or concerns at this time.

## 2017-04-17 ENCOUNTER — Other Ambulatory Visit: Payer: Self-pay | Admitting: Internal Medicine

## 2017-05-01 ENCOUNTER — Ambulatory Visit: Payer: Self-pay | Admitting: Internal Medicine

## 2017-05-03 ENCOUNTER — Other Ambulatory Visit: Payer: Self-pay | Admitting: *Deleted

## 2017-05-03 ENCOUNTER — Ambulatory Visit (INDEPENDENT_AMBULATORY_CARE_PROVIDER_SITE_OTHER): Payer: Medicare Other | Admitting: Internal Medicine

## 2017-05-03 VITALS — BP 140/86 | HR 72 | Temp 97.5°F | Resp 16 | Ht 65.5 in | Wt 197.4 lb

## 2017-05-03 DIAGNOSIS — E1122 Type 2 diabetes mellitus with diabetic chronic kidney disease: Secondary | ICD-10-CM

## 2017-05-03 DIAGNOSIS — E559 Vitamin D deficiency, unspecified: Secondary | ICD-10-CM | POA: Diagnosis not present

## 2017-05-03 DIAGNOSIS — M1000 Idiopathic gout, unspecified site: Secondary | ICD-10-CM

## 2017-05-03 DIAGNOSIS — E782 Mixed hyperlipidemia: Secondary | ICD-10-CM | POA: Diagnosis not present

## 2017-05-03 DIAGNOSIS — K219 Gastro-esophageal reflux disease without esophagitis: Secondary | ICD-10-CM

## 2017-05-03 DIAGNOSIS — Z79899 Other long term (current) drug therapy: Secondary | ICD-10-CM | POA: Diagnosis not present

## 2017-05-03 DIAGNOSIS — N182 Chronic kidney disease, stage 2 (mild): Secondary | ICD-10-CM | POA: Diagnosis not present

## 2017-05-03 DIAGNOSIS — R632 Polyphagia: Secondary | ICD-10-CM

## 2017-05-03 DIAGNOSIS — I10 Essential (primary) hypertension: Secondary | ICD-10-CM

## 2017-05-03 LAB — CBC WITH DIFFERENTIAL/PLATELET
BASOS PCT: 0 %
Basophils Absolute: 0 cells/uL (ref 0–200)
EOS PCT: 2 %
Eosinophils Absolute: 148 cells/uL (ref 15–500)
HCT: 42 % (ref 38.5–50.0)
HEMOGLOBIN: 14.8 g/dL (ref 13.2–17.1)
LYMPHS ABS: 1554 {cells}/uL (ref 850–3900)
Lymphocytes Relative: 21 %
MCH: 32.7 pg (ref 27.0–33.0)
MCHC: 35.2 g/dL (ref 32.0–36.0)
MCV: 92.7 fL (ref 80.0–100.0)
MPV: 9.5 fL (ref 7.5–12.5)
Monocytes Absolute: 740 cells/uL (ref 200–950)
Monocytes Relative: 10 %
NEUTROS ABS: 4958 {cells}/uL (ref 1500–7800)
Neutrophils Relative %: 67 %
Platelets: 190 10*3/uL (ref 140–400)
RBC: 4.53 MIL/uL (ref 4.20–5.80)
RDW: 13.1 % (ref 11.0–15.0)
WBC: 7.4 10*3/uL (ref 3.8–10.8)

## 2017-05-03 LAB — BASIC METABOLIC PANEL WITH GFR
BUN: 17 mg/dL (ref 7–25)
CALCIUM: 9.7 mg/dL (ref 8.6–10.3)
CO2: 24 mmol/L (ref 20–31)
Chloride: 94 mmol/L — ABNORMAL LOW (ref 98–110)
Creat: 0.95 mg/dL (ref 0.70–1.18)
GFR, EST NON AFRICAN AMERICAN: 81 mL/min (ref >=60)
GFR, Est African American: >89 mL/min (ref >=60)
Glucose, Bld: 221 mg/dL — ABNORMAL HIGH (ref 65–99)
POTASSIUM: 4 mmol/L (ref 3.5–5.3)
SODIUM: 134 mmol/L — AB (ref 135–146)

## 2017-05-03 LAB — HEPATIC FUNCTION PANEL
ALT: 22 U/L (ref 9–46)
AST: 16 U/L (ref 10–35)
Albumin: 4.6 g/dL (ref 3.6–5.1)
Alkaline Phosphatase: 36 U/L — ABNORMAL LOW (ref 40–115)
BILIRUBIN DIRECT: 0.2 mg/dL (ref <=0.2)
BILIRUBIN INDIRECT: 0.9 mg/dL (ref 0.2–1.2)
Total Bilirubin: 1.1 mg/dL (ref 0.2–1.2)
Total Protein: 6.9 g/dL (ref 6.1–8.1)

## 2017-05-03 LAB — LIPID PANEL
CHOL/HDL RATIO: 3.4 ratio (ref <5.0)
CHOLESTEROL: 121 mg/dL (ref <200)
HDL: 36 mg/dL — ABNORMAL LOW (ref >40)
LDL Cholesterol: 50 mg/dL (ref <100)
Triglycerides: 174 mg/dL — ABNORMAL HIGH (ref <150)
VLDL: 35 mg/dL — AB (ref <30)

## 2017-05-03 LAB — TSH: TSH: 2.76 mIU/L (ref 0.40–4.50)

## 2017-05-03 MED ORDER — TERBINAFINE HCL 250 MG PO TABS: 250.0000 mg | ORAL_TABLET | Freq: Every day | ORAL | 0 refills | Status: DC

## 2017-05-03 NOTE — Progress Notes (Signed)
This very nice 71 y.o. MWM ([place] in [place] since 1960)  presents for 3 month follow up with Hypertension, Hyperlipidemia, T2_NIDDM and Vitamin D Deficiency. Patient's Gout apparently controlled on Allopurinol.      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     Patient is treated for HTN (1970) & BP has been controlled at home. Today's BP is high normal at goal - 140/86. Patient has had no complaints of any cardiac type chest pain, palpitations, dyspnea/orthopnea/PND, dizziness, claudication, or dependent edema.     Hyperlipidemia is controlled with diet & meds. Patient denies myalgias or other med SE's. Last Lipids were  Lab Results  Component Value Date   CHOL 121 05/03/2017   HDL 36 (L) 05/03/2017   LDLCALC 50 05/03/2017   TRIG 174 (H) 05/03/2017   CHOLHDL 3.4 05/03/2017      Also, the patient has history of Morbid Obesity (BMI 32+) and has consequent T2_NIDDM poorly controlled consequent of his Gluttony and poor dietary compliance. He denies  symptoms of reactive hypoglycemia, diabetic polys, paresthesias or visual blurring.  Last A1c was  Lab Results  Component Value Date   HGBA1C 8.5 (H) 05/03/2017      Further, the patient also has history of Vitamin D Deficiency and supplements vitamin D without any suspected side-effects. Last vitamin D was   Lab Results  Component Value Date   VD25OH 78 05/03/2017   Current Outpatient Prescriptions on File Prior to Visit  Medication Sig  . allopurinol (ZYLOPRIM) 300 MG tablet TAKE 1 TABLET BY MOUTH  DAILY  . aspirin 81 MG tablet Take 81 mg by mouth daily.    Marland Kitchen atorvastatin (LIPITOR) 20 MG tablet TAKE 1 TABLET BY MOUTH  DAILY  . BESIVANCE 0.6 % SUSP   . bisoprolol-hydrochlorothiazide (ZIAC) 5-6.25 MG tablet TAKE 1 TABLET BY MOUTH  DAILY  . brimonidine (ALPHAGAN) 0.2 % ophthalmic solution   . Cholecalciferol (VITAMIN D) 1000 UNITS capsule Take 5,000 Units by mouth  daily.   . cyclopentolate (CYCLODRYL,CYCLOGYL) 1 % ophthalmic solution   . diphenhydramine-acetaminophen (TYLENOL PM) 25-500 MG TABS tablet Take 2 tablets by mouth at bedtime as needed.  . fluticasone (FLONASE) 50 MCG/ACT nasal spray USE 2 SPRAYS IN EACH  NOSTRIL DAILY  . furosemide (LASIX) 20 MG tablet TAKE 1 TABLET BY MOUTH  EVERY DAY FOR BLOOD  PRESSURE AND FLUID  . glipiZIDE (GLUCOTROL) 10 MG tablet TAKE 1 TABLET BY MOUTH 3  TIMES A DAY WITH MEALS  . glucose blood test strip Use as instructed  . JANUVIA 100 MG tablet TAKE 1 TABLET BY MOUTH  DAILY  . ketorolac (ACULAR) 0.5 % ophthalmic solution   . Loratadine (CLARITIN PO) Take by mouth daily.    Marland Kitchen losartan (COZAAR) 100 MG tablet TAKE 1 TABLET BY MOUTH  DAILY  . magnesium oxide (MAG-OX) 400 MG tablet Take 400 mg by mouth daily.  . metFORMIN (GLUCOPHAGE-XR) 500 MG 24 hr tablet TAKE 2 TABLETS BY MOUTH TWO TIMES DAILY  . minoxidil (LONITEN) 10 MG tablet TAKE 1 TABLET BY MOUTH  DAILY  . montelukast (SINGULAIR) 10 MG tablet TAKE 1 TABLET BY MOUTH  EVERY NIGHT AT BEDTIME  . prednisoLONE acetate (PRED FORTE) 1 % ophthalmic suspension   . ranitidine (ZANTAC) 300 MG tablet TAKE 1 TABLET BY MOUTH AT  BEDTIME   No current facility-administered medications on file prior to visit.    Not on File PMHx:  Past Medical History:  Diagnosis Date  . Allergy   . Colon polyp   . Diabetes mellitus without complication ([place])   . Dilated cardiomyopathy ([place])    non isch  . Gout   . Hemochromatosis   . Hypertension   . OSA (obstructive sleep apnea)    Immunization History  Administered Date(s) Administered  . DT 01/21/2014  . Influenza Split 09/11/2013  . Influenza,inj,Quad PF,36+ Mos 07/28/2014  . Influenza-Unspecified 08/21/2012  . Pneumococcal Conjugate-13 09/11/2014  . Pneumococcal-Unspecified 08/21/2012   Past Surgical History:  Procedure Laterality Date  . EYE SURGERY Bilateral    Cataract   FHx:    Reviewed / unchanged  SHx:     Reviewed / unchanged  Systems Review:  Constitutional: Denies fever, chills, wt changes, headaches, insomnia, fatigue, night sweats, change in appetite. Eyes: Denies redness, blurred vision, diplopia, discharge, itchy, watery eyes.  ENT: Denies discharge, congestion, post nasal drip, epistaxis, sore throat, earache, hearing loss, dental pain, tinnitus, vertigo, sinus pain, snoring.  CV: Denies chest pain, palpitations, irregular heartbeat, syncope, dyspnea, diaphoresis, orthopnea, PND, claudication or edema. Respiratory: denies cough, dyspnea, DOE, pleurisy, hoarseness, laryngitis, wheezing.  Gastrointestinal: Denies dysphagia, odynophagia, heartburn, reflux, water brash, abdominal pain or cramps, nausea, vomiting, bloating, diarrhea, constipation, hematemesis, melena, hematochezia  or hemorrhoids. Genitourinary: Denies dysuria, frequency, urgency, nocturia, hesitancy, discharge, hematuria or flank pain. Musculoskeletal: Denies arthralgias, myalgias, stiffness, jt. swelling, pain, limping or strain/sprain.  Skin: Denies pruritus, rash, hives, warts, acne, eczema or change in skin lesion(s). Neuro: No weakness, tremor, incoordination, spasms, paresthesia or pain. Psychiatric: Denies confusion, memory loss or sensory loss. Endo: Denies change in weight, skin or hair change.  Heme/Lymph: No excessive bleeding, bruising or enlarged lymph nodes.  Physical Exam  BP 140/86   Pulse 72   Temp (!) 97.5 F (36.4 C)   Resp 16   Ht 5' 5.5" (1.664 m)   Wt 197 lb 6.4 oz (89.5 kg)   BMI 32.35 kg/m   Appears well nourished, well groomed  and in no distress.  Eyes: PERRLA, EOMs, conjunctiva no swelling or erythema. Sinuses: No frontal/maxillary tenderness ENT/Mouth: EAC's clear, TM's nl w/o erythema, bulging. Nares clear w/o erythema, swelling, exudates. Oropharynx clear without erythema or exudates. Oral hygiene is good. Tongue normal, non obstructing. Hearing intact.  Neck: Supple. Thyroid nl. Car  2+/2+ without bruits, nodes or JVD. Chest: Respirations nl with BS clear & equal w/o rales, rhonchi, wheezing or stridor.  Cor: Heart sounds normal w/ regular rate and rhythm without sig. murmurs, gallops, clicks or rubs. Peripheral pulses normal and equal  without edema.  Abdomen: Soft & bowel sounds normal. Non-tender w/o guarding, rebound, hernias, masses or organomegaly.  Lymphatics: Unremarkable.  Musculoskeletal: Full ROM all peripheral extremities, joint stability, 5/5 strength and normal gait.  Skin: Warm, dry without exposed rashes, lesions or ecchymosis apparent.  Neuro: Cranial nerves intact, reflexes equal bilaterally. Sensory-motor testing grossly intact. Tendon reflexes grossly intact.  Pysch: Alert & oriented x 3.  Insight and judgement nl & appropriate. No ideations.  Assessment and Plan:  1. Essential hypertension  - Continue medication, monitor blood pressure at home.  - Continue DASH diet. Reminder to go to the ER if any CP,  SOB, nausea, dizziness, severe HA, changes vision/speech.  - CBC with Differential/Platelet - BASIC METABOLIC PANEL WITH GFR - Magnesium - TSH  2. Hyperlipidemia, mixed  - Continue diet/meds, exercise,& lifestyle modifications.  - Continue monitor periodic cholesterol/liver & renal functions   - Hepatic function panel -  Lipid panel - TSH  3. 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 Vitamin D deficiency  - Continue diet, exercise, lifestyle modifications.  - Monitor appropriate labs. - Continue supplementation. - VITAMIN D 25 Hydroxy   5. Idiopathic gout  - Uric acid  6. Gastroesophageal reflux disease   7. Medication management  - CBC with Differential/Platelet - BASIC METABOLIC PANEL WITH GFR - Hepatic function panel - Magnesium - Lipid panel - TSH - Hemoglobin A1c - Insulin, random - VITAMIN D 25 Hydroxy  - Uric acid  8. Gluttony       Discussed  regular exercise, BP monitoring,  weight control to achieve/maintain BMI less than 25 and discussed med and SE's. Recommended labs to assess and monitor clinical status with further disposition pending results of labs. Over 30 minutes of exam, counseling, chart review was performed.

## 2017-05-03 NOTE — Patient Instructions (Signed)

## 2017-05-04 LAB — HEMOGLOBIN A1C
Hgb A1c MFr Bld: 8.5 % — ABNORMAL HIGH (ref <5.7)
Mean Plasma Glucose: 197 mg/dL

## 2017-05-04 LAB — VITAMIN D 25 HYDROXY (VIT D DEFICIENCY, FRACTURES): Vit D, 25-Hydroxy: 78 ng/mL (ref 30–100)

## 2017-05-04 LAB — URIC ACID: Uric Acid, Serum: 5.1 mg/dL (ref 4.0–8.0)

## 2017-05-04 LAB — MAGNESIUM: Magnesium: 1.6 mg/dL (ref 1.5–2.5)

## 2017-05-04 LAB — INSULIN, RANDOM: INSULIN: 5.6 u[IU]/mL (ref 2.0–19.6)

## 2017-05-06 ENCOUNTER — Encounter: Payer: Self-pay | Admitting: Internal Medicine

## 2017-05-06 DIAGNOSIS — R632 Polyphagia: Secondary | ICD-10-CM | POA: Insufficient documentation

## 2017-05-12 ENCOUNTER — Encounter: Payer: Self-pay | Admitting: Internal Medicine

## 2017-06-12 ENCOUNTER — Telehealth: Payer: Self-pay | Admitting: Oncology

## 2017-06-12 NOTE — Telephone Encounter (Signed)
Called patient regarding appointment in october

## 2017-06-14 ENCOUNTER — Ambulatory Visit (INDEPENDENT_AMBULATORY_CARE_PROVIDER_SITE_OTHER): Payer: Medicare Other | Admitting: *Deleted

## 2017-06-14 ENCOUNTER — Other Ambulatory Visit: Payer: Self-pay | Admitting: *Deleted

## 2017-06-14 DIAGNOSIS — B351 Tinea unguium: Secondary | ICD-10-CM | POA: Diagnosis not present

## 2017-06-14 MED ORDER — TERBINAFINE HCL 250 MG PO TABS: 250.0000 mg | ORAL_TABLET | Freq: Every day | ORAL | 0 refills | Status: AC

## 2017-06-14 NOTE — Progress Notes (Signed)
Patient did not start Terbinafine, therefore no labs needed.  He will get the RX and be checked at his 08/07/2017 OV.

## 2017-06-19 ENCOUNTER — Other Ambulatory Visit: Payer: Self-pay | Admitting: Internal Medicine

## 2017-06-27 ENCOUNTER — Other Ambulatory Visit: Payer: Self-pay | Admitting: Internal Medicine

## 2017-07-25 ENCOUNTER — Other Ambulatory Visit: Payer: Medicare Other

## 2017-07-25 ENCOUNTER — Ambulatory Visit: Payer: Medicare Other | Admitting: Oncology

## 2017-07-27 ENCOUNTER — Other Ambulatory Visit (HOSPITAL_BASED_OUTPATIENT_CLINIC_OR_DEPARTMENT_OTHER): Payer: Medicare Other

## 2017-07-27 ENCOUNTER — Other Ambulatory Visit: Payer: Self-pay | Admitting: *Deleted

## 2017-07-27 ENCOUNTER — Ambulatory Visit (HOSPITAL_BASED_OUTPATIENT_CLINIC_OR_DEPARTMENT_OTHER): Payer: Medicare Other | Admitting: Oncology

## 2017-07-27 ENCOUNTER — Telehealth: Payer: Self-pay | Admitting: Emergency Medicine

## 2017-07-27 DIAGNOSIS — Z23 Encounter for immunization: Secondary | ICD-10-CM | POA: Diagnosis not present

## 2017-07-27 DIAGNOSIS — E119 Type 2 diabetes mellitus without complications: Secondary | ICD-10-CM | POA: Diagnosis not present

## 2017-07-27 DIAGNOSIS — I10 Essential (primary) hypertension: Secondary | ICD-10-CM

## 2017-07-27 DIAGNOSIS — H538 Other visual disturbances: Secondary | ICD-10-CM

## 2017-07-27 LAB — FERRITIN: Ferritin: 91 ng/mL (ref 22–316)

## 2017-07-27 MED ORDER — INFLUENZA VAC SPLIT HIGH-DOSE 0.5 ML IM SUSY
0.5000 mL | PREFILLED_SYRINGE | Freq: Once | INTRAMUSCULAR | Status: AC
  Administered 2017-07-27: 0.5 mL via INTRAMUSCULAR
  Filled 2017-07-27: qty 0.5

## 2017-07-27 NOTE — Telephone Encounter (Addendum)
Verbalized this note to patient. He verbalized back understanding.    ----- Message from [name], MD sent at 07/27/2017  1:56 PM EDT ----- Please call patient, iron level is ok,f/u as scheduled

## 2017-07-27 NOTE — Progress Notes (Signed)
  [place] OFFICE PROGRESS NOTE   Diagnosis: Hemochromatosis  INTERVAL HISTORY:   Brett [name] returns as scheduled. He last underwent phlebotomy therapy 2 years ago. He feels well. He had nausea/vomiting when he was prescribed a medication for nail fungus. He swept the parking lot earlier this week and developed erythema, swelling, and drainage from the left eye beginning 1-2 days ago. The left eye vision is blurred.  Objective:  Vital signs in last 24 hours:  Blood pressure (!) 151/85, pulse 71, temperature 97.6 F (36.4 C), temperature source Oral, resp. rate 17, height 5' 5.5" (1.664 m), weight 198 lb 4.8 oz (89.9 kg), SpO2 99 %.    HEENT: Right conjunctival/scleral erythema with increased clear drainage Resp: Lungs clear bilaterally Cardio: Regular rate and rhythm, distant heart sounds GI: No hepatosplenomegaly Vascular: No leg edema   Lab Results:  Ferritin on 03/25/1999 18-85 Medications: I have reviewed the patient's current medications.  Assessment/Plan: 1. 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 He was last treated with phlebotomy therapy on 06/25/2015 2. History of tobacco use in the remote past. 3. Hypertension. 4. Hypercholesterolemia. 5. Diabetes. 6. Gout. 7. Sleep apnea. 8. Family history of hemochromatosis.   Disposition:  He appears unchanged. We will follow-up on the ferritin level from today. The plan is to initiate phlebotomy therapy for a ferritin of greater than 100. He has inflammation at the right eye today. He will schedule an appointment with his ophthalmologist.  Brett [name] received an influenza vaccine today.  He will return for a ferritin level in 4 months and 8 months. He will be scheduled for an office visit in one year.  Donneta Romberg, MD  07/27/2017  12:12 PM

## 2017-07-28 ENCOUNTER — Telehealth: Payer: Self-pay | Admitting: Oncology

## 2017-07-28 NOTE — Telephone Encounter (Signed)
Spoke to patient regarding upcoming February 2019 appointments. Mailed copy of schedule to patient as requested.

## 2017-08-06 NOTE — Patient Instructions (Signed)

## 2017-08-06 NOTE — Progress Notes (Signed)
This very nice 71 y.o. [place] who presents for 6 month follow up with Hypertension, Hyperlipidemia, Pre-Diabetes and Vitamin D Deficiency. Patient's Gout is quiescent on Allopurinol.     Patient has been followed by Dr Julieanne Manson since 2011 for Hereditary Hemochromatosis (2002) and undergoes periodic Phlebotomies to keep ferritin below 100.      Patient is treated for HTN (1970) & BP has been controlled at home. Today's BP is  138/84. Patient has had no complaints of any cardiac type chest pain, palpitations, dyspnea / orthopnea / PND, dizziness, claudication, or dependent edema.     Hyperlipidemia is controlled with diet & meds. Patient denies myalgias or other med SE's. Last Lipids were at goal albeit sl elevated  Lab Results  Component Value Date   CHOL 121 05/03/2017   HDL 36 (L) 05/03/2017   LDLCALC 50 05/03/2017   TRIG 174 (H) 05/03/2017   CHOLHDL 3.4 05/03/2017      Also, the patient has history of Morbid Obesity (BMI 32+) and consequent T2_NIDDM (2006) w/CKD2  (GFR 81 ml/min) in poor control from his poor dietary compliance and has had no symptoms of reactive hypoglycemia, diabetic polys, paresthesias or visual blurring.  Last A1c was not at goal: Lab Results  Component Value Date   HGBA1C 8.5 (H) 05/03/2017      Further, the patient also has history of Vitamin D Deficiency and supplements vitamin D without any suspected side-effects. Last vitamin D was at goal: Lab Results  Component Value Date   VD25OH 78 05/03/2017   Current Outpatient Prescriptions on File Prior to Visit  Medication Sig  . allopurinol (ZYLOPRIM) 300 MG tablet TAKE 1 TABLET BY MOUTH  DAILY  . aspirin 81 MG tablet Take 81 mg by mouth daily.    Marland Kitchen atorvastatin (LIPITOR) 20 MG tablet TAKE 1 TABLET BY MOUTH  DAILY (Patient taking differently: TAKE HALF TABLET BY MOUTH  DAILY)  . BESIVANCE 0.6 % SUSP   . bisoprolol-hydrochlorothiazide (ZIAC) 5-6.25 MG tablet TAKE 1 TABLET BY MOUTH  DAILY  .  Cholecalciferol (VITAMIN D) 1000 UNITS capsule Take 5,000 Units by mouth daily.   . cyclopentolate (CYCLODRYL,CYCLOGYL) 1 % ophthalmic solution   . diphenhydramine-acetaminophen (TYLENOL PM) 25-500 MG TABS tablet Take 2 tablets by mouth at bedtime as needed.  . fluticasone (FLONASE) 50 MCG/ACT nasal spray USE 2 SPRAYS IN EACH  NOSTRIL DAILY  . furosemide (LASIX) 20 MG tablet TAKE 1 TABLET BY MOUTH  EVERY DAY FOR BLOOD  PRESSURE AND FLUID  . glipiZIDE (GLUCOTROL) 10 MG tablet TAKE 1 TABLET BY MOUTH 3  TIMES A DAY WITH MEALS  . glucose blood test strip Use as instructed  . JANUVIA 100 MG tablet TAKE 1 TABLET BY MOUTH  DAILY  . Loratadine (CLARITIN PO) Take by mouth daily.    Marland Kitchen losartan (COZAAR) 100 MG tablet TAKE 1 TABLET BY MOUTH  DAILY  . magnesium oxide (MAG-OX) 400 MG tablet Take 400 mg by mouth 2 (two) times daily.   . metFORMIN (GLUCOPHAGE-XR) 500 MG 24 hr tablet TAKE 2 TABLETS BY MOUTH TWO TIMES DAILY  . minoxidil (LONITEN) 10 MG tablet TAKE 1 TABLET BY MOUTH  DAILY  . Polyethyl Glycol-Propyl Glycol (SYSTANE OP) Apply to eye as needed.  . ranitidine (ZANTAC) 300 MG tablet TAKE 1 TABLET BY MOUTH AT  BEDTIME   No current facility-administered medications on file prior to visit.    No Known Allergies PMHx:   Past Medical History:  Diagnosis Date  . Allergy   . Colon polyp   . Diabetes mellitus without complication ([place])   . Dilated cardiomyopathy ([place])    non isch  . Gout   . Hemochromatosis   . Hypertension   . OSA (obstructive sleep apnea)    Immunization History  Administered Date(s) Administered  . DT 01/21/2014  . Influenza Split 09/11/2013  . Influenza, High Dose Seasonal PF 07/27/2017  . Influenza,inj,Quad PF,6+ Mos 07/28/2014  . Influenza-Unspecified 08/21/2012  . Pneumococcal Conjugate-13 09/11/2014  . Pneumococcal-Unspecified 08/21/2012   Past Surgical History:  Procedure Laterality Date  . EYE SURGERY Bilateral    Cataract   FHx:    Reviewed /  unchanged  SHx:    Reviewed / unchanged  Systems Review:  Constitutional: Denies fever, chills, wt changes, headaches, insomnia, fatigue, night sweats, change in appetite. Eyes: Denies redness, blurred vision, diplopia, discharge, itchy, watery eyes.  ENT: Denies discharge, congestion, post nasal drip, epistaxis, sore throat, earache, hearing loss, dental pain, tinnitus, vertigo, sinus pain, snoring.  CV: Denies chest pain, palpitations, irregular heartbeat, syncope, dyspnea, diaphoresis, orthopnea, PND, claudication or edema. Respiratory: denies cough, dyspnea, DOE, pleurisy, hoarseness, laryngitis, wheezing.  Gastrointestinal: Denies dysphagia, odynophagia, heartburn, reflux, water brash, abdominal pain or cramps, nausea, vomiting, bloating, diarrhea, constipation, hematemesis, melena, hematochezia  or hemorrhoids. Genitourinary: Denies dysuria, frequency, urgency, nocturia, hesitancy, discharge, hematuria or flank pain. Musculoskeletal: Denies arthralgias, myalgias, stiffness, jt. swelling, pain, limping or strain/sprain.  Skin: Denies pruritus, rash, hives, warts, acne, eczema or change in skin lesion(s). Neuro: No weakness, tremor, incoordination, spasms, paresthesia or pain. Psychiatric: Denies confusion, memory loss or sensory loss. Endo: Denies change in weight, skin or hair change.  Heme/Lymph: No excessive bleeding, bruising or enlarged lymph nodes.  Physical Exam  BP 138/84   Pulse 80   Temp (!) 97.5 F (36.4 C)   Resp 18   Ht 5' 5.5" (1.664 m)   Wt 198 lb 6.4 oz (90 kg)   BMI 32.51 kg/m   Appears over nourished, well groomed  and in no distress.  Eyes: PERRLA, EOMs, conjunctiva no swelling or erythema. Sinuses: No frontal/maxillary tenderness ENT/Mouth: EAC's clear, TM's nl w/o erythema, bulging. Nares clear w/o erythema, swelling, exudates. Oropharynx clear without erythema or exudates. Oral hygiene is good. Tongue normal, non obstructing. Hearing intact.  Neck:  Supple. Thyroid nl. Car 2+/2+ without bruits, nodes or JVD. Chest: Respirations nl with BS clear & equal w/o rales, rhonchi, wheezing or stridor.  Cor: Heart sounds normal w/ regular rate and rhythm without sig. murmurs, gallops, clicks or rubs. Peripheral pulses normal and equal  without edema.  Abdomen: Soft, protuberant  & bowel sounds normal. Non-tender w/o guarding, rebound, hernias, masses or organomegaly.  Lymphatics: Unremarkable.  Musculoskeletal: Full ROM all peripheral extremities, joint stability, 5/5 strength and normal gait.  Skin: Warm, dry without exposed rashes, lesions or ecchymosis apparent.  Neuro: Cranial nerves intact, reflexes equal bilaterally. Sensory-motor testing grossly intact. Tendon reflexes grossly intact.  Pysch: Alert & oriented x 3.  Insight and judgement nl & appropriate. No ideations.  Assessment and Plan:  1. Essential hypertension  - Continue medication, monitor blood pressure at home.  - Continue DASH diet. Reminder to go to the ER if any CP,  SOB, nausea, dizziness, severe HA, changes vision/speech.  - CBC with Differential/Platelet - BASIC METABOLIC PANEL WITH GFR - Magnesium - TSH  2. Hyperlipidemia, mixed  - Continue diet/meds, exercise,& lifestyle modifications.  - Continue monitor periodic cholesterol/liver & renal functions   -  Hepatic function panel - Lipid panel - TSH  3. Type 2 diabetes mellitus with stage 2 chronic kidney disease, without long-term current use of insulin (HCC)  - Hemoglobin A1c - Insulin, random  4. Vitamin D deficiency  - Continue diet, exercise, lifestyle modifications.  - Monitor appropriate labs. - Continue supplementation. - VITAMIN D 25 Hydroxy  5. Idiopathic gout  - Uric acid  6. Gastroesophageal reflux disease  - BASIC METABOLIC PANEL WITH GFR  7. Hereditary hemochromatosis ([place])   8. Medication management  - CBC with Differential/Platelet - BASIC METABOLIC PANEL WITH GFR - Hepatic  function panel - Magnesium - Lipid panel - TSH - Hemoglobin A1c - Insulin, random - VITAMIN D 25 Hydroxy - Uric acid       Discussed  regular exercise, BP monitoring, weight control to achieve/maintain BMI less than 25 and discussed med and SE's. Recommended labs to assess and monitor clinical status with further disposition pending results of labs. Over 30 minutes of exam, counseling, chart review was performed.

## 2017-08-07 ENCOUNTER — Ambulatory Visit (INDEPENDENT_AMBULATORY_CARE_PROVIDER_SITE_OTHER): Payer: Medicare Other | Admitting: Internal Medicine

## 2017-08-07 ENCOUNTER — Encounter: Payer: Self-pay | Admitting: Internal Medicine

## 2017-08-07 VITALS — BP 138/84 | HR 80 | Temp 97.5°F | Resp 18 | Ht 65.5 in | Wt 198.4 lb

## 2017-08-07 DIAGNOSIS — E559 Vitamin D deficiency, unspecified: Secondary | ICD-10-CM

## 2017-08-07 DIAGNOSIS — I10 Essential (primary) hypertension: Secondary | ICD-10-CM

## 2017-08-07 DIAGNOSIS — E782 Mixed hyperlipidemia: Secondary | ICD-10-CM

## 2017-08-07 DIAGNOSIS — N182 Chronic kidney disease, stage 2 (mild): Secondary | ICD-10-CM | POA: Diagnosis not present

## 2017-08-07 DIAGNOSIS — M1000 Idiopathic gout, unspecified site: Secondary | ICD-10-CM | POA: Diagnosis not present

## 2017-08-07 DIAGNOSIS — Z79899 Other long term (current) drug therapy: Secondary | ICD-10-CM

## 2017-08-07 DIAGNOSIS — E1122 Type 2 diabetes mellitus with diabetic chronic kidney disease: Secondary | ICD-10-CM | POA: Diagnosis not present

## 2017-08-07 DIAGNOSIS — K219 Gastro-esophageal reflux disease without esophagitis: Secondary | ICD-10-CM | POA: Diagnosis not present

## 2017-08-08 ENCOUNTER — Encounter: Payer: Self-pay | Admitting: Adult Health

## 2017-08-08 ENCOUNTER — Ambulatory Visit (INDEPENDENT_AMBULATORY_CARE_PROVIDER_SITE_OTHER): Payer: Medicare Other | Admitting: Adult Health

## 2017-08-08 DIAGNOSIS — E1122 Type 2 diabetes mellitus with diabetic chronic kidney disease: Secondary | ICD-10-CM | POA: Insufficient documentation

## 2017-08-08 DIAGNOSIS — E119 Type 2 diabetes mellitus without complications: Secondary | ICD-10-CM | POA: Diagnosis not present

## 2017-08-08 DIAGNOSIS — E1165 Type 2 diabetes mellitus with hyperglycemia: Secondary | ICD-10-CM

## 2017-08-08 DIAGNOSIS — N182 Chronic kidney disease, stage 2 (mild): Secondary | ICD-10-CM

## 2017-08-08 DIAGNOSIS — Z794 Long term (current) use of insulin: Secondary | ICD-10-CM

## 2017-08-08 LAB — BASIC METABOLIC PANEL WITH GFR
BUN: 23 mg/dL (ref 7–25)
CALCIUM: 9.7 mg/dL (ref 8.6–10.3)
CHLORIDE: 94 mmol/L — AB (ref 98–110)
CO2: 29 mmol/L (ref 20–32)
CREATININE: 1.18 mg/dL (ref 0.70–1.18)
GFR, EST NON AFRICAN AMERICAN: 62 mL/min/{1.73_m2} (ref >=60)
GFR, Est African American: 72 mL/min/{1.73_m2} (ref >=60)
Glucose, Bld: 517 mg/dL (ref 65–99)
Potassium: 4.1 mmol/L (ref 3.5–5.3)
Sodium: 134 mmol/L — ABNORMAL LOW (ref 135–146)

## 2017-08-08 LAB — HEPATIC FUNCTION PANEL
AG RATIO: 1.9 (calc) (ref 1.0–2.5)
ALBUMIN MSPROF: 4.6 g/dL (ref 3.6–5.1)
ALT: 18 U/L (ref 9–46)
AST: 12 U/L (ref 10–35)
Alkaline phosphatase (APISO): 38 U/L — ABNORMAL LOW (ref 40–115)
BILIRUBIN DIRECT: 0.1 mg/dL (ref 0.0–0.2)
GLOBULIN: 2.4 g/dL (ref 1.9–3.7)
Indirect Bilirubin: 0.6 mg/dL (calc) (ref 0.2–1.2)
Total Bilirubin: 0.7 mg/dL (ref 0.2–1.2)
Total Protein: 7 g/dL (ref 6.1–8.1)

## 2017-08-08 LAB — CBC WITH DIFFERENTIAL/PLATELET
Basophils Absolute: 50 cells/uL (ref 0–200)
Basophils Relative: 0.7 %
Eosinophils Absolute: 142 cells/uL (ref 15–500)
Eosinophils Relative: 2 %
HCT: 44.8 % (ref 38.5–50.0)
Hemoglobin: 15.6 g/dL (ref 13.2–17.1)
Lymphs Abs: 1519 cells/uL (ref 850–3900)
MCH: 32.4 pg (ref 27.0–33.0)
MCHC: 34.8 g/dL (ref 32.0–36.0)
MCV: 93.1 fL (ref 80.0–100.0)
MPV: 10.6 fL (ref 7.5–12.5)
Monocytes Relative: 8.7 %
Neutro Abs: 4771 cells/uL (ref 1500–7800)
Neutrophils Relative %: 67.2 %
Platelets: 201 10*3/uL (ref 140–400)
RBC: 4.81 10*6/uL (ref 4.20–5.80)
RDW: 12.2 % (ref 11.0–15.0)
Total Lymphocyte: 21.4 %
WBC mixed population: 618 cells/uL (ref 200–950)
WBC: 7.1 10*3/uL (ref 3.8–10.8)

## 2017-08-08 LAB — HEMOGLOBIN A1C
EAG (MMOL/L): 12.5 (calc)
HEMOGLOBIN A1C: 9.5 %{Hb} — AB (ref <5.7)
Mean Plasma Glucose: 226 (calc)

## 2017-08-08 LAB — LIPID PANEL
Cholesterol: 116 mg/dL (ref <200)
HDL: 36 mg/dL — ABNORMAL LOW (ref >40)
LDL Cholesterol (Calc): 52 mg/dL (calc)
Non-HDL Cholesterol (Calc): 80 mg/dL (calc) (ref <130)
Total CHOL/HDL Ratio: 3.2 (calc) (ref <5.0)
Triglycerides: 213 mg/dL — ABNORMAL HIGH (ref <150)

## 2017-08-08 LAB — MAGNESIUM: Magnesium: 1.9 mg/dL (ref 1.5–2.5)

## 2017-08-08 LAB — TSH: TSH: 2.92 m[IU]/L (ref 0.40–4.50)

## 2017-08-08 LAB — POCT GLUCOSE (2 HR PP)

## 2017-08-08 LAB — VITAMIN D 25 HYDROXY (VIT D DEFICIENCY, FRACTURES): Vit D, 25-Hydroxy: 74 ng/mL (ref 30–100)

## 2017-08-08 LAB — INSULIN, RANDOM: Insulin: 12 u[IU]/mL (ref 2.0–19.6)

## 2017-08-08 LAB — URIC ACID: URIC ACID, SERUM: 4.7 mg/dL (ref 4.0–8.0)

## 2017-08-08 NOTE — Progress Notes (Signed)
Assessment and Plan:  Brett [name] was seen today for acute visit.  Diagnoses and all orders for this visit:  Uncontrolled type 2 diabetes mellitus with hyperglycemia, without long-term current use of insulin (HCC) -     POCT Glucose (2 Hr PP) -     Increase januvia from 1/2 tab (50 mg) to 1 full tab daily (100 mg), continue  -     Blood sugar log daily, keep a food diary  POCT glucose in office 288 today. Had a long discussion with patient regarding starting insulin today vs making serious changes in lifestyle. He would like to work on his diet. He has agreed to start walking daily (previously walked ~4 miles daily- he will restart this), and cut out all white "simple" carbs, alternative suggestions and information provided. We will follow up in 3 months as scheduled with a goal of A1C to be below 8.    Further disposition pending results of labs. Discussed med's effects and SE's.   Over 30 minutes of exam, counseling, chart review, and critical decision making was performed.   Future Appointments Date Time Provider [place]  08/08/2017 3:45 PM [name], NP [facility] None  09/07/2017 8:45 AM [name], MD [facility] None  11/09/2017 8:45 AM [name], NP [facility] None  11/30/2017 9:30 AM [facility] 2 [facility] None  02/08/2018 10:00 AM [name], MD [facility] None  03/29/2018 9:30 AM [facility] 2 [facility] None  07/26/2018 11:00 AM [facility] 3 [facility] None  07/26/2018 11:30 AM [name], MD [facility] None    ------------------------------------------------------------------------------------------------------------------   HPI 71 y.o.male presents for follow up after glucose of 517 was noted on BMET yesterday.   POCT glucose today is: 288  T2DM currently managed by metformin 500 mg 2 tabs BID, januvia 50 mg daily, glucotrol 10 mg daily TID with meals. Discussed initiating insulin today vs a serious overhaul to his lifestyle-  the patient feels strongly that he would like to make significant changes to lifestyle.   He describes his currentdiet which notably includes white bread, potatoes, sweet snacks, and very few vegetables. Discussed good/bad carbs at length- given suggestions for alternatives to white bread, cutting out all "white" foods, finding alternatives that are low carb (high fiber), cutting out all sugar/sweets for the next few months until values improve. The patient also agrees to start exercising- he reports he used to walk ~4 miles daily. He plans to restart this tomorrow and continue daily for the next 3 months. We discussed going up on the januvia to 100 mg daily, and make changes to lifestyle with the goal of HbA1C to be below 8 at the next visit.   He denies hypoglycemic episodes, HA, dizziness, vision changes, polydipsia, polyuria, weight changes.   BMET    Component Value Date/Time   NA 134 (L) 08/07/2017 1025   K 4.1 08/07/2017 1025   CL 94 (L) 08/07/2017 1025   CO2 29 08/07/2017 1025   GLUCOSE 517 (HH) 08/07/2017 1025   BUN 23 08/07/2017 1025   CREATININE 1.18 08/07/2017 1025   CALCIUM 9.7 08/07/2017 1025   GFRNONAA 62 08/07/2017 1025   GFRAA 72 08/07/2017 1025   Lab Results  Component Value Date   HGBA1C 9.5 (H) 08/07/2017    Past Medical History:  Diagnosis Date  . Allergy   . Colon polyp   . Diabetes mellitus without complication ([place])   . Dilated cardiomyopathy ([place])    non isch  . Gout   .  Hemochromatosis   . Hypertension   . OSA (obstructive sleep apnea)      No Known Allergies  Current Outpatient Prescriptions on File Prior to Visit  Medication Sig  . allopurinol (ZYLOPRIM) 300 MG tablet TAKE 1 TABLET BY MOUTH  DAILY  . aspirin 81 MG tablet Take 81 mg by mouth daily.    Marland Kitchen atorvastatin (LIPITOR) 20 MG tablet TAKE 1 TABLET BY MOUTH  DAILY (Patient taking differently: TAKE HALF TABLET BY MOUTH  DAILY)  . BESIVANCE 0.6 % SUSP   . bisoprolol-hydrochlorothiazide (ZIAC)  5-6.25 MG tablet TAKE 1 TABLET BY MOUTH  DAILY  . Cholecalciferol (VITAMIN D) 1000 UNITS capsule Take 5,000 Units by mouth daily.   . cyclopentolate (CYCLODRYL,CYCLOGYL) 1 % ophthalmic solution   . diphenhydramine-acetaminophen (TYLENOL PM) 25-500 MG TABS tablet Take 2 tablets by mouth at bedtime as needed.  . fluticasone (FLONASE) 50 MCG/ACT nasal spray USE 2 SPRAYS IN EACH  NOSTRIL DAILY  . furosemide (LASIX) 20 MG tablet TAKE 1 TABLET BY MOUTH  EVERY DAY FOR BLOOD  PRESSURE AND FLUID  . glipiZIDE (GLUCOTROL) 10 MG tablet TAKE 1 TABLET BY MOUTH 3  TIMES A DAY WITH MEALS  . glucose blood test strip Use as instructed  . JANUVIA 100 MG tablet TAKE 1 TABLET BY MOUTH  DAILY  . Loratadine (CLARITIN PO) Take by mouth daily.    Marland Kitchen losartan (COZAAR) 100 MG tablet TAKE 1 TABLET BY MOUTH  DAILY  . magnesium oxide (MAG-OX) 400 MG tablet Take 400 mg by mouth 2 (two) times daily.   . metFORMIN (GLUCOPHAGE-XR) 500 MG 24 hr tablet TAKE 2 TABLETS BY MOUTH TWO TIMES DAILY  . minoxidil (LONITEN) 10 MG tablet TAKE 1 TABLET BY MOUTH  DAILY  . montelukast (SINGULAIR) 10 MG tablet   . Polyethyl Glycol-Propyl Glycol (SYSTANE OP) Apply to eye as needed.  . ranitidine (ZANTAC) 300 MG tablet TAKE 1 TABLET BY MOUTH AT  BEDTIME   No current facility-administered medications on file prior to visit.     ROS: all negative except above.   Physical Exam:  BP 136/80   Pulse 78   Resp 18   Ht 5' 5.5" (1.664 m)   Wt 196 lb 6.4 oz (89.1 kg)   SpO2 99%   BMI 32.19 kg/m   General Appearance: Well nourished, in no apparent distress. Neck: Supple, thyroid normal.  Respiratory: Respiratory effort normal, BS equal bilaterally without rales, rhonchi, wheezing or stridor.  Cardio: RRR with no MRGs. Brisk peripheral pulses without edema.  Abdomen: Soft, + BS.  Non tender, no guarding, rebound, hernias, masses. Lymphatics: Non tender without lymphadenopathy.  Musculoskeletal: Full ROM, 5/5 strength, normal gait.  Skin:  Warm, dry without rashes, lesions, ecchymosis.  Neuro: Cranial nerves intact. Normal muscle tone, no cerebellar symptoms. Sensation intact.  Psych: Awake and oriented X 3, normal affect, Insight and Judgment appropriate.   Izora Ribas, NP 3:42 PM [facility]

## 2017-08-08 NOTE — Patient Instructions (Addendum)
No white bread, no white potatoes, no white rice.  No pastries, desserts, cookies.   You can have whole wheat brown bread (look for [employer] bread band), limit slices to 2.   You can have unlimited vegetables EXCEPT: potatoes, corn  Keep a food diary, check your blood sugar 2-3 times every day and make.     Bad carbs also include fruit juice, alcohol, and sweet tea. These are empty calories that do not signal to your brain that you are full.   Please remember the good carbs are still carbs which convert into sugar. So please measure them out no more than 1/2-1 cup of rice, oatmeal, pasta, and beans  Veggies are however free foods! Pile them on.   Not all fruit is created equal. Please see the list below, the fruit at the bottom is higher in sugars than the fruit at the top. Please avoid all dried fruits.

## 2017-08-16 ENCOUNTER — Encounter: Payer: Self-pay | Admitting: Physician Assistant

## 2017-08-16 DIAGNOSIS — E11319 Type 2 diabetes mellitus with unspecified diabetic retinopathy without macular edema: Secondary | ICD-10-CM | POA: Insufficient documentation

## 2017-08-16 DIAGNOSIS — E113293 Type 2 diabetes mellitus with mild nonproliferative diabetic retinopathy without macular edema, bilateral: Secondary | ICD-10-CM

## 2017-09-07 ENCOUNTER — Ambulatory Visit (INDEPENDENT_AMBULATORY_CARE_PROVIDER_SITE_OTHER): Payer: Medicare Other | Admitting: Ophthalmology

## 2017-09-07 ENCOUNTER — Encounter: Payer: Self-pay | Admitting: Physician Assistant

## 2017-09-07 DIAGNOSIS — H35033 Hypertensive retinopathy, bilateral: Secondary | ICD-10-CM

## 2017-09-07 DIAGNOSIS — I10 Essential (primary) hypertension: Secondary | ICD-10-CM

## 2017-09-07 DIAGNOSIS — E113293 Type 2 diabetes mellitus with mild nonproliferative diabetic retinopathy without macular edema, bilateral: Secondary | ICD-10-CM

## 2017-09-07 DIAGNOSIS — E11319 Type 2 diabetes mellitus with unspecified diabetic retinopathy without macular edema: Secondary | ICD-10-CM | POA: Diagnosis not present

## 2017-09-07 DIAGNOSIS — H43813 Vitreous degeneration, bilateral: Secondary | ICD-10-CM

## 2017-09-07 LAB — HM DIABETES EYE EXAM

## 2017-11-08 NOTE — Progress Notes (Deleted)
MEDICARE ANNUAL WELLNESS VISIT AND FOLLOW UP Assessment:   Diagnoses and all orders for this visit:  Encounter for Medicare annual wellness exam  Essential hypertension At goal; continue medications Monitor blood pressure at home; call if consistently over 130/80 Continue DASH diet.   Reminder to go to the ER if any CP, SOB, nausea, dizziness, severe HA, changes vision/speech, left arm numbness and tingling and jaw pain.  Type 2 diabetes mellitus with hemoglobin A1c goal of less than 8.0% (HCC) Type 2 Diabetes Mellitus - poorly controlled Education: Reviewed 'ABCs' of diabetes management (respective goals in parentheses):  A1C (<7), blood pressure (<130/80), and cholesterol (LDL <70) Eye Exam yearly and Dental Exam every 6 months. Dietary recommendations Physical Activity recommendations - continue walking daily - Strongly advisedto start checking sugars at different times of the day - check 2 times a day, rotating checks - given sugar log and advised how to fill it and to bring it at next appt  - given foot care handout and explained the principles  - given instructions for hypoglycemia management  Foot exam performed -     BASIC METABOLIC PANEL WITH GFR -     Hemoglobin A1c  Hereditary hemochromatosis ([place]) -     CBC with Differential/Platelet  Hyperlipidemia At LDL goal; continue statin medication- discussed diet considerations for triglycerides Continue low cholesterol diet and exercise.  -     Lipid panel -     TSH  Vitamin D deficiency At goal at recent check; continue supplementation for goal of 70-100 Defer vitamin D level  Medication management -     CBC with Differential/Platelet -     BASIC METABOLIC PANEL WITH GFR -     Hepatic function panel  Obesity (BMI 30.0-34.9) Long discussion about weight loss, diet, and exercise Recommended diet heavy in fruits and veggies and low in animal meats, cheeses, and dairy products, appropriate calorie intake Patient will  work on *** Discussed appropriate weight for height (below***) and initial goal (***) Follow up in 3 months  Dilated cardiomyopathy ([place]) Weight stable, continue follow up, Control blood pressure, cholesterol, glucose, increase exercise.   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  Idiopathic gout, unspecified chronicity, unspecified site Continue allopurinol Diet discussed Check uric acid as needed  Gluttony Discussed weight, diet, exercise.   Over 30 minutes of exam, counseling, chart review, and critical decision making was performed  Future Appointments  Date Time Provider [place]  11/09/2017 10:00 AM [name], NP [facility] None  11/30/2017  9:30 AM [facility] 2 [facility] None  01/05/2018  8:30 AM [name], MD [facility] None  02/08/2018 10:00 AM [name], MD [facility] None  03/29/2018  9:30 AM [facility] 2 [facility] None  07/26/2018 11:00 AM [facility] 3 [facility] None  07/26/2018 11:30 AM [name], MD [facility] None     Plan:   During the course of the visit the patient was educated and counseled about appropriate screening and preventive services including:    Pneumococcal vaccine   Influenza vaccine  Prevnar 13  Td vaccine  Screening electrocardiogram  Colorectal cancer screening  Diabetes screening  Glaucoma screening  Nutrition counseling    Subjective:  Brett [name] is a 72 y.o. male who presents for Medicare Annual Wellness Visit and 3 month follow up for HTN, hyperlipidemia,T2DM with CKD stage 2 and retinopathy, and vitamin D Def.   Sleep  apnea?  Increase januvia from 1/2 tab (50 mg) to 1 full tab daily (100 mg), continue  -     Blood sugar log daily, keep a food diary  POCT glucose in office 288 today. Had a long discussion with patient regarding starting insulin today vs making serious changes in lifestyle. He would like to work on his diet. He has agreed to start walking daily (previously walked ~4 miles daily- he will restart this), and cut out all white "simple" carbs, alternative suggestions and information provided. We will follow up in 3 months as scheduled with a goal of A1C to be below 8.      His blood pressure {HAS HAS NOT:18834} been controlled at home, today their BP is   He {DOES_DOES ZDG:64403} workout. He denies chest pain, shortness of breath, dizziness.   He is on cholesterol medication and denies myalgias. His LDL cholesterol is at goal; triglycerides remain elevated. The cholesterol last visit was:   Lab Results  Component Value Date   CHOL 116 08/07/2017   HDL 36 (L) 08/07/2017   LDLCALC 50 05/03/2017   TRIG 213 (H) 08/07/2017   CHOLHDL 3.2 08/07/2017   He {Has/has not:18111} been working on diet and exercise for T2 prediabetes, and denies {Symptoms; diabetes w/o none:19199}. Last A1C in the office was:  Lab Results  Component Value Date   HGBA1C 9.5 (H) 08/07/2017   Last GFR Lab Results  Component Value Date   GFRNONAA 62 08/07/2017    Patient is on Vitamin D supplement and at goal at recent check:     Lab Results  Component Value Date   VD25OH 74 08/07/2017      Medication Review: Current Outpatient Medications on File Prior to Visit  Medication Sig Dispense Refill  . allopurinol (ZYLOPRIM) 300 MG tablet TAKE 1 TABLET BY MOUTH  DAILY 90 tablet 1  . aspirin 81 MG tablet Take 81 mg by mouth daily.      Marland Kitchen atorvastatin (LIPITOR) 20 MG tablet TAKE 1 TABLET BY MOUTH  DAILY (Patient taking differently: TAKE HALF TABLET BY MOUTH  DAILY) 90 tablet 1  . BESIVANCE 0.6 % SUSP     .  bisoprolol-hydrochlorothiazide (ZIAC) 5-6.25 MG tablet TAKE 1 TABLET BY MOUTH  DAILY 90 tablet 1  . Cholecalciferol (VITAMIN D) 1000 UNITS capsule Take 5,000 Units by mouth daily.     . cyclopentolate (CYCLODRYL,CYCLOGYL) 1 % ophthalmic solution     . diphenhydramine-acetaminophen (TYLENOL PM) 25-500 MG TABS tablet Take 2 tablets by mouth at bedtime as needed.    . fluticasone (FLONASE) 50 MCG/ACT nasal spray USE 2 SPRAYS IN EACH  NOSTRIL DAILY 48 g 3  . furosemide (LASIX) 20 MG tablet TAKE 1 TABLET BY MOUTH  EVERY DAY FOR BLOOD  PRESSURE AND FLUID 90 tablet 1  . glipiZIDE (GLUCOTROL) 10 MG tablet TAKE 1 TABLET BY MOUTH 3  TIMES A DAY WITH MEALS 270 tablet 1  . glucose blood test strip Use as instructed 100 each 6  . JANUVIA 100 MG tablet TAKE 1 TABLET BY MOUTH  DAILY 90 tablet 0  . Loratadine (CLARITIN PO) Take by mouth daily.      Marland Kitchen losartan (COZAAR) 100 MG tablet TAKE 1 TABLET BY MOUTH  DAILY 90 tablet 1  .  magnesium oxide (MAG-OX) 400 MG tablet Take 400 mg by mouth 2 (two) times daily.     . metFORMIN (GLUCOPHAGE-XR) 500 MG 24 hr tablet TAKE 2 TABLETS BY MOUTH TWO TIMES DAILY 360 tablet 1  . minoxidil (LONITEN) 10 MG tablet TAKE 1 TABLET BY MOUTH  DAILY 90 tablet 1  . montelukast (SINGULAIR) 10 MG tablet     . Polyethyl Glycol-Propyl Glycol (SYSTANE OP) Apply to eye as needed.    . ranitidine (ZANTAC) 300 MG tablet TAKE 1 TABLET BY MOUTH AT  BEDTIME 90 tablet 1   No current facility-administered medications on file prior to visit.     Allergies: No Known Allergies  Current Problems (verified) has Hemochromatosis; CKD stage 2 due to type 2 diabetes mellitus ([place]); Hypertension; OSA (obstructive sleep apnea); Gout; Dilated cardiomyopathy ([place]); Hyperlipidemia; Vitamin D deficiency; Medication management; Obesity (BMI 30.0-34.9); Gluttony; Type 2 diabetes mellitus with hemoglobin A1c goal of less than 8.0% ([place]); and Diabetic retinopathy ([place]) on their problem list.  Screening  Tests Immunization History  Administered Date(s) Administered  . DT 01/21/2014  . Influenza Split 09/11/2013  . Influenza, High Dose Seasonal PF 07/27/2017  . Influenza,inj,Quad PF,6+ Mos 07/28/2014  . Influenza-Unspecified 08/21/2012  . Pneumococcal Conjugate-13 09/11/2014  . Pneumococcal-Unspecified 08/21/2012   Preventative care: Last colonoscopy: 07/2015 Echo 2011 CXR 2015  Prior vaccinations: TD or Tdap: 2015  Influenza: 2018 Pneumococcal: 2013 Prevnar13: 2015 Shingles/Zostavax: declines due to cost  Names of Other Physician/Practitioners you currently use: 1. [facility] here for primary care 2. Dr. Zigmund Daniel, eye doctor, last visit 09/07/2017 - report in computer and abstracted 3. Dr. Marena Chancy, dentist, last visit 2015  Patient Care Team: [name], MD as PCP - General (Internal Medicine) [name], MD as Consulting Physician (Oncology) Stanford Breed Denice Bors, MD as Consulting Physician (Cardiology) [name], MD as Consulting Physician (Ophthalmology)  Surgical: He  has a past surgical history that includes Eye surgery (Bilateral). Family His family history includes Cancer in his maternal uncle; Diabetes in his father and other; Heart disease in his other; Hyperlipidemia in his other; Hypertension in his other. Social history  He reports that he quit smoking about 50 years ago. he has never used smokeless tobacco. He reports that he drinks alcohol. He reports that he does not use drugs.  MEDICARE WELLNESS OBJECTIVES: Physical activity:   Cardiac risk factors:   Depression/mood screen:   Depression screen [facility] 2/9 08/07/2017  Decreased Interest 0  Down, Depressed, Hopeless 0  PHQ - 2 Score 0    ADLs:  In your present state of health, do you have any difficulty performing the following activities: 08/07/2017 05/06/2017  Hearing? N N  Vision? N N  Difficulty concentrating or making decisions? N N  Walking or  climbing stairs? N N  Dressing or bathing? N N  Doing errands, shopping? N N  Some recent data might be hidden     Cognitive Testing  Alert? Yes  Normal Appearance?Yes  Oriented to person? Yes  Place? Yes   Time? Yes  Recall of three objects?  Yes  Can perform simple calculations? Yes  Displays appropriate judgment?Yes  Can read the correct time from a watch face?Yes  EOL planning:     Objective:   There were no vitals filed for this visit. There is no height or weight on file to calculate BMI.  General appearance: alert, no distress, WD/WN, male HEENT: normocephalic, sclerae anicteric, TMs pearly, nares patent, no  discharge or erythema, pharynx normal Oral cavity: MMM, no lesions Neck: supple, no lymphadenopathy, no thyromegaly, no masses Heart: RRR, normal S1, S2, no murmurs Lungs: CTA bilaterally, no wheezes, rhonchi, or rales Abdomen: +bs, soft, non tender, non distended, no masses, no hepatomegaly, no splenomegaly Musculoskeletal: nontender, no swelling, no obvious deformity Extremities: no edema, no cyanosis, no clubbing Pulses: 2+ symmetric, upper and lower extremities, normal cap refill Neurological: alert, oriented x 3, CN2-12 intact, strength normal upper extremities and lower extremities, sensation normal throughout, DTRs 2+ throughout, no cerebellar signs, gait normal Psychiatric: normal affect, behavior normal, pleasant   Medicare Attestation I have personally reviewed: The patient's medical and social history Their use of alcohol, tobacco or illicit drugs Their current medications and supplements The patient's functional ability including ADLs,fall risks, home safety risks, cognitive, and hearing and visual impairment Diet and physical activities Evidence for depression or mood disorders  The patient's weight, height, BMI, and visual acuity have been recorded in the chart.  I have made referrals, counseling, and provided education to the patient based on  review of the above and I have provided the patient with a written personalized care plan for preventive services.     Izora Ribas, NP   11/08/2017

## 2017-11-08 NOTE — Progress Notes (Deleted)
FOLLOW UP  Assessment and Plan:   Hypertension Well controlled with current medications  Monitor blood pressure at home; patient to call if consistently greater than 130/80 Continue DASH diet.   Reminder to go to the ER if any CP, SOB, nausea, dizziness, severe HA, changes vision/speech, left arm numbness and tingling and jaw pain.  Cholesterol Currently at LDL goal; discussed diet modifications for elevated triglycerides Continue low cholesterol diet and exercise.  Check lipid panel.   Diabetes with diabetic chronic kidney disease Continue medication: Continue diet and exercise.  Perform daily foot/skin check, notify office of any concerning changes.  Check A1C  Obesity with co morbidities Long discussion about weight loss, diet, and exercise Recommended diet heavy in fruits and veggies and low in animal meats, cheeses, and dairy products, appropriate calorie intake Discussed ideal weight for height (below ***) and initial weight goal (***) Patient will work on *** Will follow up in 3 months  Vitamin D Def At goal at last visit; continue supplementation to maintain goal of 70-100 Defer Vit D level  Continue diet and meds as discussed. Further disposition pending results of labs. Discussed med's effects and SE's.   Over 30 minutes of exam, counseling, chart review, and critical decision making was performed.   Future Appointments  Date Time Provider [place]  11/09/2017  8:45 AM [name], NP [facility] None  11/30/2017  9:30 AM [facility] None  01/05/2018  8:30 AM [name], MD [facility] None  02/08/2018 10:00 AM [name], MD [facility] None  03/29/2018  9:30 AM [facility] None  07/26/2018 11:00 AM [facility] None  07/26/2018 11:30 AM [name], MD [facility] None     ----------------------------------------------------------------------------------------------------------------------  HPI 72 y.o. male  presents for 3 month follow up on hypertension, cholesterol, diabetes, obesity and vitamin D deficiency.   ncrease januvia from 1/2 tab (50 mg) to 1 full tab daily (100 mg), continue  -     Blood sugar log daily, keep a food diary  POCT glucose in office 288 today. Had a long discussion with patient regarding starting insulin today vs making serious changes in lifestyle. He would like to work on his diet. He has agreed to start walking daily (previously walked ~4 miles daily- he will restart this), and cut out all white "simple" carbs, alternative suggestions and information provided. We will follow up in 3 months as scheduled with a goal of A1C to be below 8.         BMI is There is no height or weight on file to calculate BMI., he {HAS HAS FYB:01751} been working on diet and exercise. Wt Readings from Last 3 Encounters:  08/08/17 196 lb 6.4 oz (89.1 kg)  08/07/17 198 lb 6.4 oz (90 kg)  07/27/17 198 lb 4.8 oz (89.9 kg)   His blood pressure {HAS HAS NOT:18834} been controlled at home, today their BP is    He {DOES_DOES WCH:85277} workout. He denies chest pain, shortness of breath, dizziness.   He is on cholesterol medication and denies myalgias. His cholesterol is at LDL goal; triglycerides remain elevated. The cholesterol last visit was:   Lab Results  Component Value Date   CHOL 116 08/07/2017   HDL 36 (L) 08/07/2017   LDLCALC 50 05/03/2017   TRIG 213 (H) 08/07/2017   CHOLHDL 3.2 08/07/2017    He {Has/has not:18111} been working on diet and exercise for T2 diabetes, and denies {Symptoms; diabetes w/o  none:19199}. Last A1C in the office was:  Lab Results  Component Value Date   HGBA1C 9.5 (H) 08/07/2017   Patient is on Vitamin D supplement and at goal at the last check:   Lab Results  Component Value Date   VD25OH 74 08/07/2017         Current Medications:  Current Outpatient Medications on File Prior to Visit  Medication Sig  . allopurinol (ZYLOPRIM) 300 MG tablet TAKE 1 TABLET BY MOUTH  DAILY  . aspirin 81 MG tablet Take 81 mg by mouth daily.    Marland Kitchen atorvastatin (LIPITOR) 20 MG tablet TAKE 1 TABLET BY MOUTH  DAILY (Patient taking differently: TAKE HALF TABLET BY MOUTH  DAILY)  . BESIVANCE 0.6 % SUSP   . bisoprolol-hydrochlorothiazide (ZIAC) 5-6.25 MG tablet TAKE 1 TABLET BY MOUTH  DAILY  . Cholecalciferol (VITAMIN D) 1000 UNITS capsule Take 5,000 Units by mouth daily.   . cyclopentolate (CYCLODRYL,CYCLOGYL) 1 % ophthalmic solution   . diphenhydramine-acetaminophen (TYLENOL PM) 25-500 MG TABS tablet Take 2 tablets by mouth at bedtime as needed.  . fluticasone (FLONASE) 50 MCG/ACT nasal spray USE 2 SPRAYS IN EACH  NOSTRIL DAILY  . furosemide (LASIX) 20 MG tablet TAKE 1 TABLET BY MOUTH  EVERY DAY FOR BLOOD  PRESSURE AND FLUID  . glipiZIDE (GLUCOTROL) 10 MG tablet TAKE 1 TABLET BY MOUTH 3  TIMES A DAY WITH MEALS  . glucose blood test strip Use as instructed  . JANUVIA 100 MG tablet TAKE 1 TABLET BY MOUTH  DAILY  . Loratadine (CLARITIN PO) Take by mouth daily.    Marland Kitchen losartan (COZAAR) 100 MG tablet TAKE 1 TABLET BY MOUTH  DAILY  . magnesium oxide (MAG-OX) 400 MG tablet Take 400 mg by mouth 2 (two) times daily.   . metFORMIN (GLUCOPHAGE-XR) 500 MG 24 hr tablet TAKE 2 TABLETS BY MOUTH TWO TIMES DAILY  . minoxidil (LONITEN) 10 MG tablet TAKE 1 TABLET BY MOUTH  DAILY  . montelukast (SINGULAIR) 10 MG tablet   . Polyethyl Glycol-Propyl Glycol (SYSTANE OP) Apply to eye as needed.  . ranitidine (ZANTAC) 300 MG tablet TAKE 1 TABLET BY MOUTH AT  BEDTIME   No current facility-administered medications on file prior to visit.      Allergies: No Known Allergies   Medical History:  Past Medical History:  Diagnosis Date  . Allergy   . Colon polyp   . Diabetes mellitus without complication ([place])   . Dilated cardiomyopathy  ([place])    non isch  . Gout   . Hemochromatosis   . Hypertension   . OSA (obstructive sleep apnea)    Family history- Reviewed and unchanged Social history- Reviewed and unchanged   Review of Systems:  ROS    Physical Exam: There were no vitals taken for this visit. Wt Readings from Last 3 Encounters:  08/08/17 196 lb 6.4 oz (89.1 kg)  08/07/17 198 lb 6.4 oz (90 kg)  07/27/17 198 lb 4.8 oz (89.9 kg)   General Appearance: Well nourished, in no apparent distress. Eyes: PERRLA, EOMs, conjunctiva no swelling or erythema Sinuses: No Frontal/maxillary tenderness ENT/Mouth: Ext aud canals clear, TMs without erythema, bulging. No erythema, swelling, or exudate on post pharynx.  Tonsils not swollen or erythematous. Hearing normal.  Neck: Supple, thyroid normal.  Respiratory: Respiratory effort normal, BS equal bilaterally without rales, rhonchi, wheezing or stridor.  Cardio: RRR with no MRGs. Brisk peripheral pulses without edema.  Abdomen: Soft, + BS.  Non tender, no guarding, rebound,  hernias, masses. Lymphatics: Non tender without lymphadenopathy.  Musculoskeletal: Full ROM, 5/5 strength, {PSY - GAIT AND STATION:22860} gait Skin: Warm, dry without rashes, lesions, ecchymosis.  Neuro: Cranial nerves intact. No cerebellar symptoms.  Psych: Awake and oriented X 3, normal affect, Insight and Judgment appropriate.    Izora Ribas, NP 8:47 AM [facility]

## 2017-11-09 ENCOUNTER — Encounter: Payer: Self-pay | Admitting: Adult Health

## 2017-11-09 ENCOUNTER — Ambulatory Visit: Payer: Self-pay | Admitting: Adult Health

## 2017-11-09 ENCOUNTER — Ambulatory Visit (INDEPENDENT_AMBULATORY_CARE_PROVIDER_SITE_OTHER): Payer: Medicare Other | Admitting: Adult Health

## 2017-11-09 VITALS — BP 150/80 | HR 74 | Temp 97.5°F | Ht 65.5 in | Wt 194.0 lb

## 2017-11-09 DIAGNOSIS — R6889 Other general symptoms and signs: Secondary | ICD-10-CM | POA: Diagnosis not present

## 2017-11-09 DIAGNOSIS — Z0001 Encounter for general adult medical examination with abnormal findings: Secondary | ICD-10-CM | POA: Diagnosis not present

## 2017-11-09 DIAGNOSIS — I10 Essential (primary) hypertension: Secondary | ICD-10-CM | POA: Diagnosis not present

## 2017-11-09 DIAGNOSIS — G4733 Obstructive sleep apnea (adult) (pediatric): Secondary | ICD-10-CM | POA: Diagnosis not present

## 2017-11-09 DIAGNOSIS — M1000 Idiopathic gout, unspecified site: Secondary | ICD-10-CM

## 2017-11-09 DIAGNOSIS — E66811 Obesity, class 1: Secondary | ICD-10-CM

## 2017-11-09 DIAGNOSIS — I420 Dilated cardiomyopathy: Secondary | ICD-10-CM

## 2017-11-09 DIAGNOSIS — E669 Obesity, unspecified: Secondary | ICD-10-CM | POA: Diagnosis not present

## 2017-11-09 DIAGNOSIS — N182 Chronic kidney disease, stage 2 (mild): Secondary | ICD-10-CM | POA: Diagnosis not present

## 2017-11-09 DIAGNOSIS — Z79899 Other long term (current) drug therapy: Secondary | ICD-10-CM | POA: Diagnosis not present

## 2017-11-09 DIAGNOSIS — E782 Mixed hyperlipidemia: Secondary | ICD-10-CM

## 2017-11-09 DIAGNOSIS — E1122 Type 2 diabetes mellitus with diabetic chronic kidney disease: Secondary | ICD-10-CM | POA: Diagnosis not present

## 2017-11-09 DIAGNOSIS — Z0000 Encounter for general adult medical examination without abnormal findings: Secondary | ICD-10-CM

## 2017-11-09 DIAGNOSIS — E113293 Type 2 diabetes mellitus with mild nonproliferative diabetic retinopathy without macular edema, bilateral: Secondary | ICD-10-CM

## 2017-11-09 DIAGNOSIS — E559 Vitamin D deficiency, unspecified: Secondary | ICD-10-CM | POA: Diagnosis not present

## 2017-11-09 DIAGNOSIS — E119 Type 2 diabetes mellitus without complications: Secondary | ICD-10-CM | POA: Diagnosis not present

## 2017-11-09 DIAGNOSIS — R632 Polyphagia: Secondary | ICD-10-CM

## 2017-11-09 MED ORDER — "INSULIN SYRINGE-NEEDLE U-100 31G X 15/64"" 0.5 ML MISC": 3 refills | Status: DC

## 2017-11-09 MED ORDER — BISOPROLOL-HYDROCHLOROTHIAZIDE 10-6.25 MG PO TABS: 1.0000 | ORAL_TABLET | Freq: Every day | ORAL | 1 refills | Status: DC

## 2017-11-09 MED ORDER — INSULIN PEN NEEDLE 31G X 8 MM MISC: 3 refills | Status: DC

## 2017-11-09 MED ORDER — INSULIN NPH ISOPHANE & REGULAR (70-30) 100 UNIT/ML ~~LOC~~ SUSP: SUBCUTANEOUS | 4 refills | Status: DC

## 2017-11-09 MED ORDER — ATORVASTATIN CALCIUM 20 MG PO TABS: ORAL_TABLET | ORAL | 1 refills | Status: DC

## 2017-11-09 NOTE — Patient Instructions (Addendum)
Take 2 tabs of ziac (bisoprolol/hydrochlorothiazide 5/6.25) until you run out, new prescription will be filled at a higher dose (10 mg/6.25 mg) - go back to 1 tab daily when you fill this.   I am prescribing insulin (novolin 70/30) for you. I am also writing a prescription for supplies you will need. The pharmacy can help you figure out the cheapest way to get these supplies, which may be over the counter. Please pick this up at the pharmacy and [place]. Please continue with current diabetic medications until then.   Please start walking daily as we discussed and agreed upon at last visit.     Monitor your blood pressure at home. Go to the ER if any CP, SOB, nausea, dizziness, severe HA, changes vision/speech  Goal BP:  For patients younger than 60: Goal BP < 140/90. For patients 60 and older: Goal BP < 150/90. For patients with diabetes: Goal BP < 140/90. Your most recent BP: BP: (!) 150/80   Take your medications faithfully as instructed. Maintain a healthy weight. Get at least 150 minutes of aerobic exercise per week. Minimize salt intake. Minimize alcohol intake  DASH Eating Plan DASH stands for "Dietary Approaches to Stop Hypertension." The DASH eating plan is a healthy eating plan that has been shown to reduce high blood pressure (hypertension). Additional health benefits may include reducing the risk of type 2 diabetes mellitus, heart disease, and stroke. The DASH eating plan may also help with weight loss. WHAT DO I NEED TO KNOW ABOUT THE DASH EATING PLAN? For the DASH eating plan, you will follow these general guidelines:  Choose foods with a percent daily value for sodium of less than 5% (as listed on the food label).  Use salt-free seasonings or herbs instead of table salt or sea salt.  Check with your health care provider or pharmacist before using salt substitutes.  Eat lower-sodium products, often labeled as "lower sodium" or "no salt  added."  Eat fresh foods.  Eat more vegetables, fruits, and low-fat dairy products.  Choose whole grains. Look for the word "whole" as the first word in the ingredient list.  Choose fish and skinless chicken or [place] more often than red meat. Limit fish, poultry, and meat to 6 oz (170 g) each day.  Limit sweets, desserts, sugars, and sugary drinks.  Choose heart-healthy fats.  Limit cheese to 1 oz (28 g) per day.  Eat more home-cooked food and less restaurant, buffet, and fast food.  Limit fried foods.  Cook foods using methods other than frying.  Limit canned vegetables. If you do use them, rinse them well to decrease the sodium.  When eating at a restaurant, ask that your food be prepared with less salt, or no salt if possible. WHAT FOODS CAN I EAT? Seek help from a dietitian for individual calorie needs. Grains Whole grain or whole wheat bread. Brown rice. Whole grain or whole wheat pasta. Quinoa, bulgur, and whole grain cereals. Low-sodium cereals. Corn or whole wheat flour tortillas. Whole grain cornbread. Whole grain crackers. Low-sodium crackers. Vegetables Fresh or frozen vegetables (raw, steamed, roasted, or grilled). Low-sodium or reduced-sodium tomato and vegetable juices. Low-sodium or reduced-sodium tomato sauce and paste. Low-sodium or reduced-sodium canned vegetables.  Fruits All fresh, canned (in natural juice), or frozen fruits. Meat and Other Protein Products Ground beef (85% or leaner), grass-fed beef, or beef trimmed of fat. Skinless chicken or [place]. Ground chicken or [place]. Pork trimmed of fat. All  fish and seafood. Eggs. Dried beans, peas, or lentils. Unsalted nuts and seeds. Unsalted canned beans. Dairy Low-fat dairy products, such as skim or 1% milk, 2% or reduced-fat cheeses, low-fat ricotta or cottage cheese, or plain low-fat yogurt. Low-sodium or reduced-sodium cheeses. Fats and Oils Tub margarines without trans fats. Light or reduced-fat  mayonnaise and salad dressings (reduced sodium). Avocado. Safflower, olive, or canola oils. Natural peanut or almond butter. Other Unsalted popcorn and pretzels. The items listed above may not be a complete list of recommended foods or beverages. Contact your dietitian for more options. WHAT FOODS ARE NOT RECOMMENDED? Grains White bread. White pasta. White rice. Refined cornbread. Bagels and croissants. Crackers that contain trans fat. Vegetables Creamed or fried vegetables. Vegetables in a cheese sauce. Regular canned vegetables. Regular canned tomato sauce and paste. Regular tomato and vegetable juices. Fruits Dried fruits. Canned fruit in light or heavy syrup. Fruit juice. Meat and Other Protein Products Fatty cuts of meat. Ribs, chicken wings, bacon, sausage, bologna, salami, chitterlings, fatback, hot dogs, bratwurst, and packaged luncheon meats. Salted nuts and seeds. Canned beans with salt. Dairy Whole or 2% milk, cream, half-and-half, and cream cheese. Whole-fat or sweetened yogurt. Full-fat cheeses or blue cheese. Nondairy creamers and whipped toppings. Processed cheese, cheese spreads, or cheese curds. Condiments Onion and garlic salt, seasoned salt, table salt, and sea salt. Canned and packaged gravies. Worcestershire sauce. Tartar sauce. Barbecue sauce. Teriyaki sauce. Soy sauce, including reduced sodium. Steak sauce. Fish sauce. Oyster sauce. Cocktail sauce. Horseradish. Ketchup and mustard. Meat flavorings and tenderizers. Bouillon cubes. Hot sauce. Tabasco sauce. Marinades. Taco seasonings. Relishes. Fats and Oils Butter, stick margarine, lard, shortening, ghee, and bacon fat. Coconut, palm kernel, or palm oils. Regular salad dressings. Other Pickles and olives. Salted popcorn and pretzels. The items listed above may not be a complete list of foods and beverages to avoid. Contact your dietitian for more information. WHERE CAN I FIND MORE INFORMATION? National Heart, Lung, and  Blood Institute: travelstabloid.com Document Released: 09/29/2011 Document Revised: 02/24/2014 Document Reviewed: 08/14/2013 [facility] Patient Information 2015 [place], [place]. This information is not intended to replace advice given to you by your health care provider. Make sure you discuss any questions you have with your health care provider.           Targets for Glucose Readings: Time of Check Usual Target for Most People  Before Meals  70-130  Two hours after meals  Less than 180  Bedtime  90-150   Why Should You Check Your Blood Glucose? -The A1C tells you how your diabetes is doing over a 3 month period.  Home blood glucose monitoring (or checking) gives you information about your diabetes on a daily basis.  You will learn how well your diabetes care plan is working and whether your blood glucose is in your target range throughout the day.   -Reviewing daily blood glucose levels will help you and your healthcare team make any needed changes to you meal plan, physical activity and medications.  Meter Supplies: - A glucose meter was provided at your visit today along with strips and lancets. The blood glucose meter strips and lancets are usually covered by health insurance. Please let us know if they are not and contact your insurance to see which meter they prefer.  How to get a good blood sample: -Wash your hands in warm water.  You do not need to use alcohol wipes. -Massage your hands. -Choose which finger you will use.  It helps to  use a different finger each time to avoid soreness. -Keep you hand below your wrist when using you lancet to "prick" your finger. -Apply gentle pressure, but do NOT squeeze your finger.  Checking your blood glucose Important Reminders: -Make sure your strips are not expired.  Check the date on the bottle. -Make sure the code on bottle matches the code on your machine. -Make sure your hands are clean and  dry. -Do not use the center of your finger, it is the most sensitive area.  Use a spot to the side of the center of your fingertip. -Completely fill the strip target area with blood (until it beeps) to make sure the results are accurate. -You will need a prescription to have your glucose strips and lancets covered by insurance. -There is usually an 800 number on the back of your meter for help with meter issues.  How often to check: -If you take diabetes pills or take one injection of insulin each day, you will usually be asked to check twice a day, before breakfast and 2 hours after one meal, or as directed. -If you take several insulin injections each day, you will usually be asked to check four times a day before meals and at bedtime every day.

## 2017-11-09 NOTE — Progress Notes (Signed)
MEDICARE ANNUAL WELLNESS VISIT AND FOLLOW UP Assessment:   Diagnoses and all orders for this visit:  Encounter for Medicare annual wellness exam  Essential hypertension Not at goal on multiple agents; will increase ziac dose to 10/6.25 Monitor blood pressure at home; call if consistently over 130/80 Continue DASH diet.   Reminder to go to the ER if any CP, SOB, nausea, dizziness, severe HA, changes vision/speech, left arm numbness and tingling and jaw pain. Follow up in 1 month  Type 2 diabetes mellitus with hemoglobin A1c goal of less than 8.0% (HCC) Type 2 Diabetes Mellitus - poorly controlled  Remains very poorly controlled with no signs of serious commitment to lifestyle modification. Cost of medication is also a barrier. Diabetic retinopathy and PVD, high risk for complications. Discussed starting insulin today. Will bring him back in 1 month to start this. Novolin 70/30 and supplies prescribed.   Reviewed 'ABCs' of diabetes management (respective goals in parentheses):  A1C (<7), blood pressure (<130/80), and cholesterol (LDL <70) Eye Exam yearly and Dental Exam every 6 months. Dietary recommendations Physical Activity recommendations - START walking daily as discussed previously - Strongly advisedto start checking sugars at different times of the day - check 2 times a day, rotating checks - given sugar log and advised how to fill it and to bring it at next appt  - given foot care handout and explained the principles  - given instructions for hypoglycemia management  Foot exam performed -     BASIC METABOLIC PANEL WITH GFR -     Hemoglobin A1c  Hereditary hemochromatosis ([place]) -     CBC with Differential/Platelet  Hyperlipidemia At LDL goal; continue statin medication- discussed diet considerations for triglycerides Continue low cholesterol diet and exercise.  -     Lipid panel -     TSH  Vitamin D deficiency At goal at recent check; continue supplementation for goal of  70-100 Defer vitamin D level  Medication management -     CBC with Differential/Platelet -     BASIC METABOLIC PANEL WITH GFR -     Hepatic function panel  Obesity (BMI 30.0-34.9) Long discussion about weight loss, diet, and exercise Recommended diet heavy in fruits and veggies and low in animal meats, cheeses, and dairy products, appropriate calorie intake Patient will work on start exercising daily as discussed last visit but never initiated Discussed appropriate weight for height Follow up in 3 months  Dilated cardiomyopathy ([place]) Weight stable, continue follow up, Control blood pressure, cholesterol, glucose, increase exercise.   OSA (obstructive sleep apnea) Has machine but not wearing secondary to poor mask fitment/discomfort.  Will refer back for sleep study as last was over 20 years ago and we do not have access to report.   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  Idiopathic gout, unspecified chronicity, unspecified site Continue allopurinol Diet discussed Check uric acid as needed  Gluttony Discussed weight, diet, exercise.   Over 30 minutes of exam, counseling, chart review, and critical decision making was performed  Future Appointments  Date Time Provider [place]  11/30/2017  9:30 AM [facility] None  01/05/2018  8:30 AM [name], MD [facility] None  02/08/2018 10:00 AM [name], MD [facility]  None  03/29/2018  9:30 AM [facility] None  07/26/2018 11:00 AM [facility] None  07/26/2018 11:30 AM [name], MD [facility] None     Plan:   During the course of the visit the patient  was educated and counseled about appropriate screening and preventive services including:    Pneumococcal vaccine   Influenza vaccine  Prevnar 13  Td vaccine  Screening electrocardiogram  Colorectal cancer screening  Diabetes screening  Glaucoma screening  Nutrition counseling    Subjective:  Brett [name] is a 72 y.o. male who presents for Medicare Annual Wellness Visit and 3 month follow up for HTN, hyperlipidemia,T2DM with CKD stage 2 and retinopathy, and vitamin D Def. He has dx of OSA with CPAP at home given over 20 years ago but he reports he has not been wearing secondary to poor mask fitment.    BMI is Body mass index is 31.79 kg/m., he has not been working on diet or exercise.  Wt Readings from Last 3 Encounters:  11/09/17 194 lb (88 kg)  08/08/17 196 lb 6.4 oz (89.1 kg)  08/07/17 198 lb 6.4 oz (90 kg)   His blood pressure has not been controlled at home, today their BP is BP: (!) 150/80 He does not workout. He denies chest pain, shortness of breath, dizziness.   He is on cholesterol medication and denies myalgias. His LDL cholesterol is at goal; triglycerides remain elevated. The cholesterol last visit was:   Lab Results  Component Value Date   CHOL 116 08/07/2017   HDL 36 (L) 08/07/2017   LDLCALC 50 05/03/2017   TRIG 213 (H) 08/07/2017   CHOLHDL 3.2 08/07/2017   He has not  been working on diet or exercise for T2 prediabetes, and denies hypoglycemia , increased appetite, nausea, paresthesia of the feet, polydipsia, polyuria and visual disturbances. He presents with sugar logs (checks 2-3 times daily) demonstrating fasting values ranging from 120s to 304, post prandial/random checks as high as 456. He checks Last A1C in the office was:  Lab Results  Component Value Date   HGBA1C 9.5 (H) 08/07/2017   Last GFR Lab Results  Component Value Date   GFRNONAA 62 08/07/2017    Patient is on Vitamin D supplement and at goal at recent check:     Lab Results   Component Value Date   VD25OH 74 08/07/2017      Medication Review: Current Outpatient Medications on File Prior to Visit  Medication Sig Dispense Refill  . allopurinol (ZYLOPRIM) 300 MG tablet TAKE 1 TABLET BY MOUTH  DAILY 90 tablet 1  . aspirin 81 MG tablet Take 81 mg by mouth daily.      Marland Kitchen BESIVANCE 0.6 % SUSP     . Cholecalciferol (VITAMIN D) 1000 UNITS capsule Take 5,000 Units by mouth daily.     . cyclopentolate (CYCLODRYL,CYCLOGYL) 1 % ophthalmic solution     . diphenhydramine-acetaminophen (TYLENOL PM) 25-500 MG TABS tablet Take 2 tablets by mouth at bedtime as needed.    . fluticasone (FLONASE) 50 MCG/ACT nasal spray USE 2 SPRAYS IN EACH  NOSTRIL DAILY 48 g 3  . furosemide (LASIX) 20 MG tablet TAKE 1 TABLET BY MOUTH  EVERY DAY FOR BLOOD  PRESSURE AND FLUID 90 tablet 1  . glipiZIDE (GLUCOTROL) 10 MG tablet TAKE 1 TABLET BY MOUTH 3  TIMES A DAY WITH MEALS 270 tablet 1  . glucose blood test strip Use as instructed 100 each 6  .  JANUVIA 100 MG tablet TAKE 1 TABLET BY MOUTH  DAILY 90 tablet 0  . Loratadine (CLARITIN PO) Take by mouth daily.      Marland Kitchen losartan (COZAAR) 100 MG tablet TAKE 1 TABLET BY MOUTH  DAILY 90 tablet 1  . magnesium oxide (MAG-OX) 400 MG tablet Take 400 mg by mouth 2 (two) times daily.     . metFORMIN (GLUCOPHAGE-XR) 500 MG 24 hr tablet TAKE 2 TABLETS BY MOUTH TWO TIMES DAILY 360 tablet 1  . minoxidil (LONITEN) 10 MG tablet TAKE 1 TABLET BY MOUTH  DAILY 90 tablet 1  . montelukast (SINGULAIR) 10 MG tablet     . Polyethyl Glycol-Propyl Glycol (SYSTANE OP) Apply to eye as needed.    . ranitidine (ZANTAC) 300 MG tablet TAKE 1 TABLET BY MOUTH AT  BEDTIME 90 tablet 1   No current facility-administered medications on file prior to visit.     Allergies: No Known Allergies  Current Problems (verified) has Hemochromatosis; CKD stage 2 due to type 2 diabetes mellitus ([place]); Hypertension; OSA (obstructive sleep apnea); Gout; Dilated cardiomyopathy ([place]); Hyperlipidemia;  Vitamin D deficiency; Medication management; Obesity (BMI 30.0-34.9); Gluttony; Type 2 diabetes mellitus with hemoglobin A1c goal of less than 8.0% ([place]); and Diabetic retinopathy ([place]) on their problem list.  Screening Tests Immunization History  Administered Date(s) Administered  . DT 01/21/2014  . Influenza Split 09/11/2013  . Influenza, High Dose Seasonal PF 07/27/2017  . Influenza,inj,Quad PF,6+ Mos 07/28/2014  . Influenza-Unspecified 08/21/2012  . Pneumococcal Conjugate-13 09/11/2014  . Pneumococcal-Unspecified 08/21/2012   Preventative care: Last colonoscopy: 07/2015 Echo 2011 CXR 2015  Prior vaccinations: TD or Tdap: 2015  Influenza: 2018 Pneumococcal: 2013 Prevnar13: 2015 Shingles/Zostavax: declines due to cost  Names of Other Physician/Practitioners you currently use: 1. [facility] here for primary care 2. Dr. Zigmund Daniel, eye doctor, last visit 09/07/2017 - report in computer and abstracted 3. Dr. Marena Chancy, dentist, last visit 2015  Patient Care Team: [name], MD as PCP - General (Internal Medicine) [name], MD as Consulting Physician (Oncology) Stanford Breed Denice Bors, MD as Consulting Physician (Cardiology) [name], MD as Consulting Physician (Ophthalmology)  Surgical: He  has a past surgical history that includes Eye surgery (Bilateral). Family His family history includes Cancer in his maternal uncle; Diabetes in his brother, father, and other; Heart attack in his brother; Heart disease in his other; Hyperlipidemia in his other; Hypertension in his other. Social history  He reports that he quit smoking about 40 years ago. His smoking use included cigarettes. He started smoking about 60 years ago. He has a 57.00 pack-year smoking history. he has never used smokeless tobacco. He reports that he drinks alcohol. He reports that he does not use drugs.  MEDICARE WELLNESS OBJECTIVES: Physical activity: Current  Exercise Habits: The patient does not participate in regular exercise at present, Exercise limited by: None identified Cardiac risk factors: Cardiac Risk Factors include: advanced age (>58men, >2 women);diabetes mellitus;dyslipidemia;hypertension;male gender;obesity (BMI >30kg/m2);sedentary lifestyle;family history of premature cardiovascular disease;smoking/ tobacco exposure Depression/mood screen:   Depression screen [facility] 2/9 11/09/2017  Decreased Interest 0  Down, Depressed, Hopeless 0  PHQ - 2 Score 0    ADLs:  In your present state of health, do you have any difficulty performing the following activities: 11/09/2017 08/07/2017  Hearing? N N  Vision? N N  Difficulty concentrating or making decisions? N N  Walking or climbing stairs? N N  Dressing or bathing? N N  Doing errands, shopping?  N N  Some recent data might be hidden     Cognitive Testing  Alert? Yes  Normal Appearance?Yes  Oriented to person? Yes  Place? Yes   Time? Yes  Recall of three objects?  Yes  Can perform simple calculations? Yes  Displays appropriate judgment?Yes  Can read the correct time from a watch face?Yes  EOL planning: Does Patient Have a Medical Advance Directive?: Yes Type of Advance Directive: Healthcare Power of Attorney, Living will Does patient want to make changes to medical advance directive?: No - Patient declined Copy of [place] in Chart?: No - copy requested   Objective:   Today's Vitals   11/09/17 1002  BP: (!) 150/80  Pulse: 74  Temp: (!) 97.5 F (36.4 C)  SpO2: 98%  Weight: 194 lb (88 kg)  Height: 5' 5.5" (1.664 m)   Body mass index is 31.79 kg/m.  General appearance: alert, no distress, WD/WN, male HEENT: normocephalic, sclerae anicteric, TMs pearly, nares patent, no discharge or erythema, pharynx normal Oral cavity: MMM, no lesions Neck: supple, no lymphadenopathy, no thyromegaly, no masses Heart: RRR, normal S1, S2, no murmurs Lungs: CTA  bilaterally, no wheezes, rhonchi, or rales Abdomen: +bs, soft, non tender, non distended, no masses, no hepatomegaly, no splenomegaly Musculoskeletal: nontender, no swelling, no obvious deformity Extremities: no edema, no cyanosis, no clubbing Pulses: 1+ symmetric lower extremities,  normal cap refill Neurological: alert, oriented x 3, CN2-12 intact, strength normal upper extremities and lower extremities, sensation normal throughout except feet with 8/10 bilaterally with monofilament, DTRs 2+ throughout, no cerebellar signs, gait normal Psychiatric: normal affect, behavior normal, pleasant   Medicare Attestation I have personally reviewed: The patient's medical and social history Their use of alcohol, tobacco or illicit drugs Their current medications and supplements The patient's functional ability including ADLs,fall risks, home safety risks, cognitive, and hearing and visual impairment Diet and physical activities Evidence for depression or mood disorders  The patient's weight, height, BMI, and visual acuity have been recorded in the chart.  I have made referrals, counseling, and provided education to the patient based on review of the above and I have provided the patient with a written personalized care plan for preventive services.     Izora Ribas, NP   11/09/2017

## 2017-11-10 LAB — CBC WITH DIFFERENTIAL/PLATELET
BASOS ABS: 30 {cells}/uL (ref 0–200)
Basophils Relative: 0.5 %
EOS PCT: 2 %
Eosinophils Absolute: 118 cells/uL (ref 15–500)
HCT: 40.4 % (ref 38.5–50.0)
Hemoglobin: 14.4 g/dL (ref 13.2–17.1)
Lymphs Abs: 1422 cells/uL (ref 850–3900)
MCH: 32.4 pg (ref 27.0–33.0)
MCHC: 35.6 g/dL (ref 32.0–36.0)
MCV: 90.8 fL (ref 80.0–100.0)
MPV: 10 fL (ref 7.5–12.5)
Monocytes Relative: 8.3 %
NEUTROS PCT: 65.1 %
Neutro Abs: 3841 cells/uL (ref 1500–7800)
PLATELETS: 166 10*3/uL (ref 140–400)
RBC: 4.45 10*6/uL (ref 4.20–5.80)
RDW: 12.9 % (ref 11.0–15.0)
TOTAL LYMPHOCYTE: 24.1 %
WBC mixed population: 490 cells/uL (ref 200–950)
WBC: 5.9 10*3/uL (ref 3.8–10.8)

## 2017-11-10 LAB — BASIC METABOLIC PANEL WITH GFR
BUN: 22 mg/dL (ref 7–25)
CALCIUM: 9.4 mg/dL (ref 8.6–10.3)
CHLORIDE: 100 mmol/L (ref 98–110)
CO2: 27 mmol/L (ref 20–32)
Creat: 0.88 mg/dL (ref 0.70–1.18)
GFR, EST AFRICAN AMERICAN: 100 mL/min/{1.73_m2} (ref >=60)
GFR, EST NON AFRICAN AMERICAN: 86 mL/min/{1.73_m2} (ref >=60)
Glucose, Bld: 310 mg/dL — ABNORMAL HIGH (ref 65–99)
POTASSIUM: 3.7 mmol/L (ref 3.5–5.3)
SODIUM: 138 mmol/L (ref 135–146)

## 2017-11-10 LAB — HEPATIC FUNCTION PANEL
AG Ratio: 2 (calc) (ref 1.0–2.5)
ALBUMIN MSPROF: 4.4 g/dL (ref 3.6–5.1)
ALKALINE PHOSPHATASE (APISO): 34 U/L — AB (ref 40–115)
ALT: 17 U/L (ref 9–46)
AST: 13 U/L (ref 10–35)
BILIRUBIN DIRECT: 0.2 mg/dL (ref 0.0–0.2)
BILIRUBIN TOTAL: 0.8 mg/dL (ref 0.2–1.2)
Globulin: 2.2 g/dL (calc) (ref 1.9–3.7)
Indirect Bilirubin: 0.6 mg/dL (calc) (ref 0.2–1.2)
Total Protein: 6.6 g/dL (ref 6.1–8.1)

## 2017-11-10 LAB — LIPID PANEL
CHOLESTEROL: 112 mg/dL (ref <200)
HDL: 39 mg/dL — ABNORMAL LOW (ref >40)
LDL CHOLESTEROL (CALC): 50 mg/dL
Non-HDL Cholesterol (Calc): 73 mg/dL (calc) (ref <130)
Total CHOL/HDL Ratio: 2.9 (calc) (ref <5.0)
Triglycerides: 144 mg/dL (ref <150)

## 2017-11-10 LAB — HEMOGLOBIN A1C
EAG (MMOL/L): 11.2 (calc)
HEMOGLOBIN A1C: 8.7 %{Hb} — AB (ref <5.7)
MEAN PLASMA GLUCOSE: 203 (calc)

## 2017-11-10 LAB — TSH: TSH: 2.74 m[IU]/L (ref 0.40–4.50)

## 2017-11-20 ENCOUNTER — Other Ambulatory Visit: Payer: Self-pay

## 2017-11-20 DIAGNOSIS — E119 Type 2 diabetes mellitus without complications: Secondary | ICD-10-CM

## 2017-11-20 MED ORDER — INSULIN ASPART PROT & ASPART (70-30 MIX) 100 UNIT/ML ~~LOC~~ SUSP: SUBCUTANEOUS | 11 refills | Status: DC

## 2017-11-27 ENCOUNTER — Other Ambulatory Visit: Payer: Self-pay

## 2017-11-27 NOTE — Telephone Encounter (Signed)
Entered in error

## 2017-11-30 ENCOUNTER — Other Ambulatory Visit: Payer: Medicare Other

## 2017-12-13 NOTE — Progress Notes (Signed)
Diabetes Education and Follow-Up Visit  72 y.o.male presents for diabetic education. He has Diabetes Mellitus type 2:  with other circulatory complications, he is on bASA, and denies increased appetite, nausea, paresthesia of the feet, polydipsia, polyuria, visual disturbances, vomiting and weight loss.  Last hemoglobin A1c was: Lab Results  Component Value Date   HGBA1C 8.7 (H) 11/09/2017   HGBA1C 9.5 (H) 08/07/2017   HGBA1C 8.5 (H) 05/03/2017   BMI is Body mass index is 31.3 kg/m., he has not been working on diet and exercise. Wt Readings from Last 3 Encounters:  12/14/17 191 lb (86.6 kg)  11/09/17 194 lb (88 kg)  08/08/17 196 lb 6.4 oz (89.1 kg)   Pt is on a regimen of:  Metformin, januvia, glipizide  Pt checks his sugars 3 x day - but forgot to bring log today Lowest sugar was 150s.  He does not has hypoglycemia awareness at this level. He reports single episode of hypoglycemia in the past with glucose in 40s.   Highest sugar was 500+.  Glucometer: Not sure   Exercise: hasn't started exercise, but plans to walk around pond every other day for 1 hour  Patient does not have CKD He is on ACE/ARB  Lab Results  Component Value Date   GFRAA 100 11/09/2017   Lab Results  Component Value Date   GFRNONAA 86 11/09/2017   Lab Results  Component Value Date   CREATININE 0.88 11/09/2017   BUN 22 11/09/2017   NA 138 11/09/2017   K 3.7 11/09/2017   CL 100 11/09/2017   CO2 27 11/09/2017   Lab Results  Component Value Date   MICROALBUR 0.6 01/18/2017    He is on a Statin.  He is at goal of less than 70.  Lab Results  Component Value Date   CHOL 112 11/09/2017   HDL 39 (L) 11/09/2017   LDLCALC 50 05/03/2017   TRIG 144 11/09/2017   CHOLHDL 2.9 11/09/2017   Problem List has Hemochromatosis; CKD stage 2 due to type 2 diabetes mellitus ([place]); Hypertension; OSA (obstructive sleep apnea); Gout; Dilated cardiomyopathy ([place]); Hyperlipidemia; Vitamin D deficiency; Medication  management; Obesity (BMI 30.0-34.9); Gluttony; Type 2 diabetes mellitus with hemoglobin A1c goal of less than 8.0% (HCC); and Diabetic retinopathy ([place]) on their problem list.  Medications Current Outpatient Medications on File Prior to Visit  Medication Sig  . allopurinol (ZYLOPRIM) 300 MG tablet TAKE 1 TABLET BY MOUTH  DAILY  . aspirin 81 MG tablet Take 81 mg by mouth daily.    Marland Kitchen atorvastatin (LIPITOR) 20 MG tablet Take 1/2-1 tab daily as directed by mouth. (Patient taking differently: Take 1 tablet every other day)  . bisoprolol-hydrochlorothiazide (ZIAC) 10-6.25 MG tablet Take 1 tablet by mouth daily.  . Cholecalciferol (VITAMIN D) 1000 UNITS capsule Take 5,000 Units by mouth daily.   . diphenhydramine-acetaminophen (TYLENOL PM) 25-500 MG TABS tablet Take 1 tablet by mouth at bedtime as needed.   . furosemide (LASIX) 20 MG tablet TAKE 1 TABLET BY MOUTH  EVERY DAY FOR BLOOD  PRESSURE AND FLUID  . glipiZIDE (GLUCOTROL) 10 MG tablet TAKE 1 TABLET BY MOUTH 3  TIMES A DAY WITH MEALS  . glucose blood test strip Use as instructed  . insulin NPH-regular Human (NOVOLIN 70/30) (70-30) 100 UNIT/ML injection Inject 20 units in the AM [place] with food, and inject 10 units [place] with evening meal.  . Insulin Pen Needle 31G X 8 MM MISC Use one daily [place]  . Insulin Syringe-Needle  U-100 31G X 15/64" 0.5 ML MISC Use two pens daily with insulin  . JANUVIA 100 MG tablet TAKE 1 TABLET BY MOUTH  DAILY  . Loratadine (CLARITIN PO) Take by mouth daily.    Marland Kitchen losartan (COZAAR) 100 MG tablet TAKE 1 TABLET BY MOUTH  DAILY  . magnesium oxide (MAG-OX) 400 MG tablet Take 400 mg by mouth 2 (two) times daily.   . metFORMIN (GLUCOPHAGE-XR) 500 MG 24 hr tablet TAKE 2 TABLETS BY MOUTH TWO TIMES DAILY  . minoxidil (LONITEN) 10 MG tablet TAKE 1 TABLET BY MOUTH  DAILY  . montelukast (SINGULAIR) 10 MG tablet   . ranitidine (ZANTAC) 300 MG tablet TAKE 1 TABLET BY MOUTH AT  BEDTIME  . BESIVANCE 0.6 % SUSP   . cyclopentolate  (CYCLODRYL,CYCLOGYL) 1 % ophthalmic solution   . fluticasone (FLONASE) 50 MCG/ACT nasal spray USE 2 SPRAYS IN EACH  NOSTRIL DAILY  . insulin aspart protamine- aspart (NOVOLOG MIX 70/30) (70-30) 100 UNIT/ML injection Inject 20 units in the morning with food and inject 10 units [place] with evening meal. (Patient not taking: Reported on 12/14/2017)  . Polyethyl Glycol-Propyl Glycol (SYSTANE OP) Apply to eye as needed.   No current facility-administered medications on file prior to visit.     ROS- see HPI  Physical Exam: Blood pressure 138/72, pulse 72, temperature (!) 97.5 F (36.4 C), height 5' 5.5" (1.664 m), weight 191 lb (86.6 kg), SpO2 97 %. Body mass index is 31.3 kg/m. General Appearance: Well nourished, in no apparent distress. Eyes: PERRLA, EOMs, conjunctiva no swelling or erythema ENT/Mouth: Ext aud canals clear, TMs without erythema, bulging. No erythema, swelling, or exudate on post pharynx.  Tonsils not swollen or erythematous. Hearing normal.  Respiratory: Respiratory effort normal, BS equal bilaterally without rales, rhonchi, wheezing or stridor.  Cardio: RRR with no MRGs. Brisk peripheral pulses without edema.  Abdomen: Soft, + BS.  Non tender, no guarding, rebound, hernias, masses. Musculoskeletal: Full ROM, 5/5 strength, normal gait.  Skin: Warm, dry without rashes, lesions, ecchymosis.  Neuro: Cranial nerves intact. Normal muscle tone, no cerebellar symptoms. Sensation intact.    Plan and Assessment: Diabetes Education: Reviewed 'ABCs' of diabetes management (respective goals in parentheses):  A1C (<7), blood pressure (<130/80), and cholesterol (LDL <70) Eye Exam yearly and Dental Exam every 6 months - verified today and patient is up to date on preventive care  Dietary recommendations reviewed Physical Activity recommendations - start walking daily or every other day  - Continue checking sugars - fasting + rotating throughout day - reminded to bring sugar log at follow up   - Will have him bring his meter to compare with that in office for accuracy  - given instructions for hypoglycemia management - patient able to verbalize signs of hypoglycemia  Inititated novolog 70/30 - 20 units AM, 10 units PM - this was demonstrated and patient demonstrated back with NS in office today D/c glipizide, will have complete remaining Januvia then stop for cost considerations Follow up in 1-2 weeks to evaluate progress with insulin Discussed if we can control A1C below 7 with weight loss will taper back off insulin for oral agents only   Future Appointments  Date Time Provider [place]  01/05/2018  8:30 AM [name], MD [facility] None  02/08/2018 10:00 AM [name], MD [facility] None  03/29/2018  9:30 AM [facility] None  07/26/2018 11:00 AM [facility] 3 [facility] None  07/26/2018 11:30 AM [name], MD [facility]  None

## 2017-12-14 ENCOUNTER — Encounter: Payer: Self-pay | Admitting: Adult Health

## 2017-12-14 ENCOUNTER — Ambulatory Visit (INDEPENDENT_AMBULATORY_CARE_PROVIDER_SITE_OTHER): Payer: Medicare Other | Admitting: Adult Health

## 2017-12-14 VITALS — BP 138/72 | HR 72 | Temp 97.5°F | Ht 65.5 in | Wt 191.0 lb

## 2017-12-14 DIAGNOSIS — E113293 Type 2 diabetes mellitus with mild nonproliferative diabetic retinopathy without macular edema, bilateral: Secondary | ICD-10-CM | POA: Diagnosis not present

## 2017-12-14 DIAGNOSIS — I10 Essential (primary) hypertension: Secondary | ICD-10-CM

## 2017-12-14 DIAGNOSIS — E119 Type 2 diabetes mellitus without complications: Secondary | ICD-10-CM | POA: Diagnosis not present

## 2017-12-14 DIAGNOSIS — N182 Chronic kidney disease, stage 2 (mild): Secondary | ICD-10-CM

## 2017-12-14 DIAGNOSIS — E669 Obesity, unspecified: Secondary | ICD-10-CM | POA: Diagnosis not present

## 2017-12-14 DIAGNOSIS — E1122 Type 2 diabetes mellitus with diabetic chronic kidney disease: Secondary | ICD-10-CM | POA: Diagnosis not present

## 2017-12-14 DIAGNOSIS — E782 Mixed hyperlipidemia: Secondary | ICD-10-CM

## 2017-12-14 NOTE — Patient Instructions (Addendum)
This medication forces your blood sugar down no matter what it is starting at. Let us know if you start to have blood sugars getting too low, call the office and let us know.   Stop GLIPIZIDE medication -   Complete JANUVIA that you have left, then will stop  Start insulin 20 units in the morning and 10 units in the evening,   Continue drinking plenty of water -   Start walking every other day as discussed.   Hypoglycemia Hypoglycemia occurs when the level of sugar (glucose) in the blood is too low. Glucose is a type of sugar that provides the body's main source of energy. Certain hormones (insulin and glucagon) control the level of glucose in the blood. Insulin lowers blood glucose, and glucagon increases blood glucose. Hypoglycemia can result from having too much insulin in the bloodstream, or from not eating enough food that contains glucose. Hypoglycemia can happen in people who do or do not have diabetes. It can develop quickly, and it can be a medical emergency. What are the causes? Hypoglycemia occurs most often in people who have diabetes. If you have diabetes, hypoglycemia may be caused by:  Diabetes medicine.  Not eating enough, or not eating often enough.  Increased physical activity.  Drinking alcohol, especially when you have not eaten recently.  If you do not have diabetes, hypoglycemia may be caused by:  A tumor in the pancreas. The pancreas is the organ that makes insulin.  Not eating enough, or not eating for long periods at a time (fasting).  Severe infection or illness that affects the liver, heart, or kidneys.  Certain medicines.  You may also have reactive hypoglycemia. This condition causes hypoglycemia within 4 hours of eating a meal. This may occur after having stomach surgery. Sometimes, the cause of reactive hypoglycemia is not known. What increases the risk? Hypoglycemia is more likely to develop in:  People who have diabetes and take medicines to  lower blood glucose.  People who abuse alcohol.  People who have a severe illness.  What are the signs or symptoms? Hypoglycemia may not cause any symptoms. If you have symptoms, they may include:  Hunger.  Anxiety.  Sweating and feeling clammy.  Confusion.  Dizziness or feeling light-headed.  Sleepiness.  Nausea.  Increased heart rate.  Headache.  Blurry vision.  Seizure.  Nightmares.  Tingling or numbness around the mouth, lips, or tongue.  A change in speech.  Decreased ability to concentrate.  A change in coordination.  Restless sleep.  Tremors or shakes.  Fainting.  Irritability.  How is this diagnosed? Hypoglycemia is diagnosed with a blood test to measure your blood glucose level. This blood test is done while you are having symptoms. Your health care provider may also do a physical exam and review your medical history. If you do not have diabetes, other tests may be done to find the cause of your hypoglycemia. How is this treated? This condition can often be treated by immediately eating or drinking something that contains glucose, such as:  3-4 sugar tablets (glucose pills).  Glucose gel, 15-gram tube.  Fruit juice, 4 oz (120 mL).  Regular soda (not diet soda), 4 oz (120 mL).  Low-fat milk, 4 oz (120 mL).  Several pieces of hard candy.  Sugar or honey, 1 Tbsp.  Treating Hypoglycemia If You Have Diabetes  If you are alert and able to swallow safely, follow the 15:15 rule:  Take 15 grams of a rapid-acting carbohydrate. Rapid-acting  options include: ? 1 tube of glucose gel. ? 3 glucose pills. ? 6-8 pieces of hard candy. ? 4 oz (120 mL) of fruit juice. ? 4 oz (120 ml) of regular (not diet) soda.  Check your blood glucose 15 minutes after you take the carbohydrate.  If the repeat blood glucose level is still at or below 70 mg/dL (3.9 mmol/L), take 15 grams of a carbohydrate again.  If your blood glucose level does not increase  above 70 mg/dL (3.9 mmol/L) after 3 tries, seek emergency medical care.  After your blood glucose level returns to normal, eat a meal or a snack within 1 hour.  Treating Severe Hypoglycemia Severe hypoglycemia is when your blood glucose level is at or below 54 mg/dL (3 mmol/L). Severe hypoglycemia is an emergency. Do not wait to see if the symptoms will go away. Get medical help right away. Call your local emergency services (911 in the [place] Do not drive yourself to the hospital. If you have severe hypoglycemia and you cannot eat or drink, you may need an injection of glucagon. A family member or close friend should learn how to check your blood glucose and how to give you a glucagon injection. Ask your health care provider if you need to have an emergency glucagon injection kit available. Severe hypoglycemia may need to be treated in a hospital. The treatment may include getting glucose through an IV tube. You may also need treatment for the cause of your hypoglycemia. Follow these instructions at home: General instructions  Avoid any diets that cause you to not eat enough food. Talk with your health care provider before you start any new diet.  Take over-the-counter and prescription medicines only as told by your health care provider.  Limit alcohol intake to no more than 1 drink per day for nonpregnant women and 2 drinks per day for men. One drink equals 12 oz of beer, 5 oz of wine, or 1 oz of hard liquor.  Keep all follow-up visits as told by your health care provider. This is important. If You Have Diabetes:   Make sure you know the symptoms of hypoglycemia.  Always have a rapid-acting carbohydrate snack with you to treat low blood sugar.  Follow your diabetes management plan, as told by your health care provider. Make sure you: ? Take your medicines as directed. ? Follow your exercise plan. ? Follow your meal plan. Eat on time, and do not skip meals. ? Check your blood glucose  as often as directed. Make sure to check your blood glucose before and after exercise. If you exercise longer or in a different way than usual, check your blood glucose more often. ? Follow your sick day plan whenever you cannot eat or drink normally. Make this plan in advance with your health care provider.  Share your diabetes management plan with people in your workplace, school, and household.  Check your urine for ketones when you are ill and as told by your health care provider.  Carry a medical alert card or wear medical alert jewelry. If You Have Reactive Hypoglycemia or Low Blood Sugar From Other Causes:  Monitor your blood glucose as told by your health care provider.  Follow instructions from your health care provider about eating or drinking restrictions. Contact a health care provider if:  You have problems keeping your blood glucose in your target range.  You have frequent episodes of hypoglycemia. Get help right away if:  You continue to have hypoglycemia symptoms  after eating or drinking something containing glucose.  Your blood glucose is at or below 54 mg/dL (3 mmol/L).  You have a seizure.  You faint. These symptoms may represent a serious problem that is an emergency. Do not wait to see if the symptoms will go away. Get medical help right away. Call your local emergency services (911 in the [place] Do not drive yourself to the hospital. This information is not intended to replace advice given to you by your health care provider. Make sure you discuss any questions you have with your health care provider. Document Released: 10/10/2005 Document Revised: 03/23/2016 Document Reviewed: 11/13/2015 Elsevier Interactive Patient Education  [employer].

## 2017-12-21 NOTE — Progress Notes (Signed)
Assessment and Plan:  Type 2 diabetes mellitus with hemoglobin A1c goal of less than 8.0% (HCC), Insulin dose changed (HCC) Continue medications: metformin, increase insulin to 25 units and 15 units  Keep food log with sugar log  Continue diet and exercise.  Perform daily foot/skin check, notify office of any concerning changes.  Check A1C at next visit   Further disposition pending results of labs. Discussed med's effects and SE's.   Over 15 minutes of exam, counseling, chart review, and critical decision making was performed.   Future Appointments  Date Time Provider [place]  01/05/2018  8:30 AM [name], MD [facility] None  02/08/2018 10:00 AM [name], MD [facility] None  03/29/2018  9:30 AM [facility] None  07/26/2018 11:00 AM [facility] 3 [facility] None  07/26/2018 11:30 AM [name], MD [facility] None    ------------------------------------------------------------------------------------------------------------------   HPI BP 136/68   Pulse 69   Temp 97.9 F (36.6 C)   Ht 5' 5.5" (1.664 m)   Wt 196 lb (88.9 kg)   SpO2 96%   BMI 32.12 kg/m   72 y.o.male presents for 1 week close follow up after newly starting on insulin therapy for T2DM. At the last visit, novolin 70/30 was initiated at 20 units AM, 10 units PM   He has been working on diet and exercise for diabetes, and denies foot ulcerations, hypoglycemia , increased appetite, nausea, paresthesia of the feet, polydipsia, polyuria, visual disturbances, vomiting and weight loss. Last A1C in the office was:  Lab Results  Component Value Date   HGBA1C 8.7 (H) 11/09/2017   He has logged sugars and presents with log today - fasting ranges from 111- 288 averaging 150-180s   Breakfast: 2 slices of rye bread with coffee with milk, chocolate protein shake (1g carb)  Lunch: 2 eggs, [place] sausage, 1 slice rye bread toast 3pm: open faced turkey/cheese sandwich  Reports  he drinks water and coffee only  Reports he snacks on apples and other fruit during the day  He has not been walking "much" - 0-2 miles a day  BMI is Body mass index is 32.12 kg/m. Wt Readings from Last 3 Encounters:  12/22/17 196 lb (88.9 kg)  12/14/17 191 lb (86.6 kg)  11/09/17 194 lb (88 kg)    Past Medical History:  Diagnosis Date  . Allergy   . Colon polyp   . Diabetes mellitus without complication ([place])   . 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 Gout   . Hemochromatosis   . Hypertension   . OSA (obstructive sleep apnea)      No Known Allergies  Current Outpatient Medications on File Prior to Visit  Medication Sig  . allopurinol (ZYLOPRIM) 300 MG tablet TAKE 1 TABLET BY MOUTH  DAILY  . aspirin 81 MG tablet Take 81 mg by mouth daily.    Marland Kitchen atorvastatin (LIPITOR) 20 MG tablet Take 1/2-1 tab daily as directed by mouth. (Patient taking differently: Take 1 tablet every other day)  . BESIVANCE 0.6 % SUSP   . bisoprolol-hydrochlorothiazide (ZIAC) 10-6.25 MG tablet Take 1 tablet by mouth daily.  . Cholecalciferol (VITAMIN D) 1000 UNITS capsule Take 5,000 Units by mouth daily.   . cyclopentolate (CYCLODRYL,CYCLOGYL) 1 % ophthalmic solution   . diphenhydramine-acetaminophen (TYLENOL PM) 25-500 MG TABS tablet Take 1 tablet by mouth at bedtime as needed.   . furosemide (LASIX) 20 MG tablet TAKE 1 TABLET BY MOUTH  EVERY DAY  FOR BLOOD  PRESSURE AND FLUID  . glucose blood test strip Use as instructed  . insulin NPH-regular Human (NOVOLIN 70/30) (70-30) 100 UNIT/ML injection Inject 20 units in the AM [place] with food, and inject 10 units [place] with evening meal.  . Insulin Pen Needle 31G X 8 MM MISC Use one daily [place]  . Insulin Syringe-Needle U-100 31G X 15/64" 0.5 ML MISC Use two pens daily with insulin  . Loratadine (CLARITIN PO) Take by mouth daily.    Marland Kitchen losartan (COZAAR) 100 MG tablet TAKE 1 TABLET BY MOUTH  DAILY  . magnesium oxide (MAG-OX) 400 MG tablet Take 400 mg by mouth 2  (two) times daily.   . metFORMIN (GLUCOPHAGE-XR) 500 MG 24 hr tablet TAKE 2 TABLETS BY MOUTH TWO TIMES DAILY  . minoxidil (LONITEN) 10 MG tablet TAKE 1 TABLET BY MOUTH  DAILY  . montelukast (SINGULAIR) 10 MG tablet   . Polyethyl Glycol-Propyl Glycol (SYSTANE OP) Apply to eye as needed.  . ranitidine (ZANTAC) 300 MG tablet TAKE 1 TABLET BY MOUTH AT  BEDTIME   No current facility-administered medications on file prior to visit.     ROS: all negative except above.   Physical Exam:  BP 136/68   Pulse 69   Temp 97.9 F (36.6 C)   Ht 5' 5.5" (1.664 m)   Wt 196 lb (88.9 kg)   SpO2 96%   BMI 32.12 kg/m   General Appearance: Well nourished, in no apparent distress. Eyes: PERRLA, EOMs, conjunctiva no swelling or erythema Sinuses: No Frontal/maxillary tenderness ENT/Mouth: Ext aud canals clear, TMs without erythema, bulging. No erythema, swelling, or exudate on post pharynx.  Tonsils not swollen or erythematous. Hearing normal.  Neck: Supple, thyroid normal.  Respiratory: Respiratory effort normal, BS equal bilaterally without rales, rhonchi, wheezing or stridor.  Cardio: RRR with no MRGs. Brisk peripheral pulses without edema.  Abdomen: Soft, + BS.  Non tender, no guarding, rebound, hernias, masses. Lymphatics: Non tender without lymphadenopathy.  Musculoskeletal: Full ROM, 5/5 strength, normal gait.  Skin: Warm, dry without rashes, lesions, ecchymosis.  Neuro: Cranial nerves intact. Normal muscle tone, no cerebellar symptoms. Sensation intact.  Psych: Awake and oriented X 3, normal affect, Insight and Judgment appropriate.     Izora Ribas, NP 9:35 AM [facility]

## 2017-12-22 ENCOUNTER — Encounter: Payer: Self-pay | Admitting: Adult Health

## 2017-12-22 ENCOUNTER — Ambulatory Visit (INDEPENDENT_AMBULATORY_CARE_PROVIDER_SITE_OTHER): Payer: Medicare Other | Admitting: Adult Health

## 2017-12-22 VITALS — BP 136/68 | HR 69 | Temp 97.9°F | Ht 65.5 in | Wt 196.0 lb

## 2017-12-22 DIAGNOSIS — Z794 Long term (current) use of insulin: Secondary | ICD-10-CM | POA: Diagnosis not present

## 2017-12-22 DIAGNOSIS — E119 Type 2 diabetes mellitus without complications: Secondary | ICD-10-CM

## 2017-12-22 NOTE — Patient Instructions (Addendum)
  Increase AM dose to 25 units and PM dose to 15 units  If you check your sugars after a meal - wait until 2 hours after meals to check    Please keep a food log - write down every thing that you eat, and write your sugars down as well     Targets for Glucose Readings: Time of Check Usual Target for Most People  Before Meals  70-130  Two hours after meals  Less than 180  Bedtime  90-150   Why Should You Check Your Blood Glucose? -The A1C tells you how your diabetes is doing over a 3 month period.  Home blood   glucose monitoring (or checking) gives you information about your diabetes on a daily basis.  You will learn how well your diabetes care plan is working and whether your blood glucose is in your target range throughout the day.   -Reviewing daily blood glucose levels will help you and your healthcare team make any needed changes to you meal plan, physical activity and medications.  Meter Supplies: - A glucose meter was provided at your visit today along with strips and lancets. The blood glucose meter strips and lancets are usually covered by health insurance. Please let us know if they are not and contact your insurance to see which meter they prefer.  How to get a good blood sample: -Wash your hands in warm water.  You do not need to use alcohol wipes. -Massage your hands. -Choose which finger you will use.  It helps to use a different finger each time to avoid soreness. -Keep you hand below your wrist when using you lancet to "prick" your finger. -Apply gentle pressure, but do NOT squeeze your finger.  Checking your blood glucose Important Reminders: -Make sure your strips are not expired.  Check the date on the bottle. -Make sure the code on bottle matches the code on your machine. -Make sure your hands are clean and dry.   -Do not use the center of your finger, it is the most sensitive area.  Use a spot to the side of the center of your fingertip. -Completely fill  the strip target area with blood (until it beeps) to make sure the results are accurate. -You will need a prescription to have your glucose strips and lancets covered by insurance. -There is usually an 800 number on the back of your meter for help with meter issues.  How often to check: -If you take diabetes pills or take one injection of insulin each day, you will usually be asked to check twice a day, before breakfast and 2 hours after one meal, or as directed. -If you take several insulin injections each day, you will usually be asked to check four times a day before meals and at bedtime every day.

## 2018-01-05 ENCOUNTER — Other Ambulatory Visit: Payer: Self-pay | Admitting: Physician Assistant

## 2018-01-05 ENCOUNTER — Encounter (INDEPENDENT_AMBULATORY_CARE_PROVIDER_SITE_OTHER): Payer: Medicare Other | Admitting: Ophthalmology

## 2018-01-05 DIAGNOSIS — E11319 Type 2 diabetes mellitus with unspecified diabetic retinopathy without macular edema: Secondary | ICD-10-CM | POA: Diagnosis not present

## 2018-01-05 DIAGNOSIS — H43813 Vitreous degeneration, bilateral: Secondary | ICD-10-CM

## 2018-01-05 DIAGNOSIS — H35372 Puckering of macula, left eye: Secondary | ICD-10-CM

## 2018-01-05 DIAGNOSIS — I10 Essential (primary) hypertension: Secondary | ICD-10-CM | POA: Diagnosis not present

## 2018-01-05 DIAGNOSIS — E113393 Type 2 diabetes mellitus with moderate nonproliferative diabetic retinopathy without macular edema, bilateral: Secondary | ICD-10-CM | POA: Diagnosis not present

## 2018-01-05 DIAGNOSIS — H35033 Hypertensive retinopathy, bilateral: Secondary | ICD-10-CM

## 2018-01-05 LAB — HM DIABETES EYE EXAM

## 2018-01-10 ENCOUNTER — Encounter: Payer: Self-pay | Admitting: *Deleted

## 2018-02-07 ENCOUNTER — Encounter: Payer: Self-pay | Admitting: Internal Medicine

## 2018-02-07 NOTE — Patient Instructions (Signed)

## 2018-02-07 NOTE — Progress Notes (Signed)
[facility]   [name], M.D.     Uvaldo Bristle. Silverio Lay, P.A.-C [name], [place]                [address] [place], [place] [address] Telephone [phone] Telefax [phone]  Comprehensive Evaluation & Examination     This very nice 72 y.o. MWM of [place] descent (in [place] since 1960) presents for & comprehensive evaluation and management of multiple medical co-morbidities.  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 He also has gout controlled on his meds.      Patient is followed by Dr Benay Spice  for Hereditary Hemochromatosis  for periodic phlebotomies.     HTN predates since 67. Patient's BP has been controlled at home.  Today's BP is 142/84. Heart cath in 2006 was negative. Patient denies any cardiac symptoms as chest pain, palpitations, shortness of breath, dizziness or ankle swelling.     Patient's hyperlipidemia is controlled with diet and medications. Patient denies myalgias or other medication SE's. Last lipids were at goal and he was advised to taper his Lipitor: Lab Results  Component Value Date   CHOL 112 11/09/2017   HDL 39 (L) 11/09/2017   LDLCALC 50 11/09/2017   TRIG 144 11/09/2017   CHOLHDL 2.9 11/09/2017      Patient hasT2_DM since 2006 and recently in February was switched from oral agents to insulin.  He admits afternoon glucoses range up to 200-300 mg%. Currently , he's on Novolin 70/30 at 25 units qam and 20 units qpm. 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 He denies reactive hypoglycemic symptoms, visual blurring, diabetic polys or paresthesias. Last A1c in January prior to starting insulin was not at goal: Lab Results  Component Value Date   HGBA1C 8.7 (H) 11/09/2017       Finally, patient has history of Vitamin D Deficiency of    and last vitamin D was  Lab Results  Component Value Date   VD25OH 74 08/07/2017   Current Outpatient  Medications on File Prior to Visit  Medication Sig  . allopurinol (ZYLOPRIM) 300 MG tablet TAKE 1 TABLET BY MOUTH  DAILY (Patient not taking: Reported on 02/08/2018)  . aspirin 81 MG tablet Take 81 mg by mouth daily.    Marland Kitchen atorvastatin (LIPITOR) 20 MG tablet Take 1/2-1 tab daily as directed by mouth. (Patient not taking: Reported on 02/08/2018)  . BESIVANCE 0.6 % SUSP   . bisoprolol-hydrochlorothiazide (ZIAC) 10-6.25 MG tablet Take 1 tablet by mouth daily. (Patient not taking: Reported on 02/08/2018)  . Cholecalciferol (VITAMIN D) 1000 UNITS capsule Take 5,000 Units by mouth daily.   . cyclopentolate (CYCLODRYL,CYCLOGYL) 1 % ophthalmic solution   . diphenhydramine-acetaminophen (TYLENOL PM) 25-500 MG TABS tablet Take 1 tablet by mouth at bedtime as needed.   . furosemide (LASIX) 20 MG tablet TAKE 1 TABLET BY MOUTH  EVERY DAY FOR BLOOD  PRESSURE AND FLUID (Patient not taking: Reported on 02/08/2018)  . glucose blood test strip Use as instructed (Patient not taking: Reported on 02/08/2018)  . insulin NPH-regular Human (NOVOLIN 70/30) (70-30) 100 UNIT/ML injection Inject 20 units in the AM [place] with food, and inject 10 units [place] with evening meal. (Patient not taking: Reported on 02/08/2018)  . Insulin Pen Needle 31G X 8 MM MISC Use one daily  (Patient not taking:  Reported on 02/08/2018)  . Insulin Syringe-Needle U-100 31G X 15/64" 0.5 ML MISC Use two pens daily with insulin (Patient not taking: Reported on 02/08/2018)  . Loratadine (CLARITIN PO) Take by mouth daily.    Marland Kitchen losartan (COZAAR) 100 MG tablet TAKE 1 TABLET BY MOUTH  DAILY (Patient not taking: Reported on 02/08/2018)  . magnesium oxide (MAG-OX) 400 MG tablet Take 400 mg by mouth 2 (two) times daily.   . metFORMIN (GLUCOPHAGE-XR) 500 MG 24 hr tablet TAKE 2 TABLETS BY MOUTH TWO TIMES DAILY (Patient not taking: Reported on 02/08/2018)  . minoxidil (LONITEN) 10 MG tablet TAKE 1 TABLET BY MOUTH  DAILY (Patient not taking: Reported on 02/08/2018)  .  montelukast (SINGULAIR) 10 MG tablet   . Polyethyl Glycol-Propyl Glycol (SYSTANE OP) Apply to eye as needed.  . ranitidine (ZANTAC) 300 MG tablet TAKE 1 TABLET BY MOUTH AT  BEDTIME (Patient not taking: Reported on 02/08/2018)   No current facility-administered medications on file prior to visit.    No Known Allergies   Past Medical History:  Diagnosis Date  . Allergy   . Colon polyp   . Diabetes mellitus without complication ([place])   . Dilated cardiomyopathy ([place])    non isch  . Gout   . Hemochromatosis   . Hypertension   . OSA (obstructive sleep apnea)    Health Maintenance  Topic Date Due  . Hepatitis C Screening  11/09/2018 (Originally [birth year])  . HEMOGLOBIN A1C  05/09/2018  . INFLUENZA VACCINE  05/24/2018  . OPHTHALMOLOGY EXAM  01/06/2019  . FOOT EXAM  02/08/2019  . COLONOSCOPY  08/24/2020  . TETANUS/TDAP  01/22/2024  . PNA vac Low Risk Adult  Completed   Immunization History  Administered Date(s) Administered  . DT 01/21/2014  . Influenza Split 09/11/2013  . Influenza, High Dose Seasonal PF 07/27/2017  . Influenza,inj,Quad PF,6+ Mos 07/28/2014  . Influenza-Unspecified 08/21/2012  . Pneumococcal Conjugate-13 09/11/2014  . Pneumococcal-Unspecified 08/21/2012   Last Colon - 08/25/2015 Dr Earlean Shawl recc 5 yr f/u due Nov 2021.  Past Surgical History:  Procedure Laterality Date  . EYE SURGERY Bilateral    Cataract   Family History  Problem Relation Age of Onset  . Diabetes Father   . Diabetes Other   . Hypertension Other   . Hyperlipidemia Other   . Heart disease Other   . Cancer Maternal Uncle        brain  . Heart attack Brother        74  . Diabetes Brother    Socioeconomic History  . Marital status: Single    Spouse name: Not on file  . Number of children: Not on file  . Years of education: Not on file  . Highest education level: Not on file  Tobacco Use  . Smoking status: Former Smoker    Packs/day: 3.00    Years: 19.00    Pack years: 57.00     Types: Cigarettes    Start date: 10/24/1957    Last attempt to quit: 11/24/1976    Years since quitting: 41.2  . Smokeless tobacco: Never Used  Substance and Sexual Activity  . Alcohol use: Yes, Occasionally    ROS Constitutional: Denies fever, chills, weight loss/gain, headaches, insomnia,  night sweats or change in appetite. Does c/o fatigue. Eyes: Denies redness, blurred vision, diplopia, discharge, itchy or watery eyes.  ENT: Denies discharge, congestion, post nasal drip, epistaxis, sore throat, earache, hearing loss, dental pain, Tinnitus, Vertigo, Sinus pain or snoring.  Cardio: Denies chest pain, palpitations, irregular heartbeat, syncope, dyspnea, diaphoresis, orthopnea, PND, claudication or edema Respiratory: denies cough, dyspnea, DOE, pleurisy, hoarseness, laryngitis or wheezing.  Gastrointestinal: Denies dysphagia, heartburn, reflux, water brash, pain, cramps, nausea, vomiting, bloating, diarrhea, constipation, hematemesis, melena, hematochezia, jaundice or hemorrhoids Genitourinary: Denies dysuria, frequency, urgency, nocturia, hesitancy, discharge, hematuria or flank pain Musculoskeletal: Denies arthralgia, myalgia, stiffness, Jt. Swelling, pain, limp or strain/sprain. Denies Falls. Skin: Denies puritis, rash, hives, warts, acne, eczema or change in skin lesion Neuro: No weakness, tremor, incoordination, spasms, paresthesia or pain Psychiatric: Denies confusion, memory loss or sensory loss. Denies Depression. Endocrine: Denies change in weight, skin, hair change, nocturia, and paresthesia, diabetic polys, visual blurring or hyper / hypo glycemic episodes.  Heme/Lymph: No excessive bleeding, bruising or enlarged lymph nodes.  Physical Exam  BP (!) 142/84   Pulse 64   Temp (!) 97.4 F (36.3 C)   Resp 18   Ht 5\' 5"  (1.651 m)   Wt 196 lb 3.2 oz (89 kg)   BMI 32.65 kg/m   General Appearance: Well nourished and well groomed and in no apparent distress.  Eyes: PERRLA, EOMs,  conjunctiva no swelling or erythema, normal fundi and vessels. Sinuses: No frontal/maxillary tenderness ENT/Mouth: EACs patent / TMs  nl. Nares clear without erythema, swelling, mucoid exudates. Oral hygiene is good. No erythema, swelling, or exudate. Tongue normal, non-obstructing. Tonsils not swollen or erythematous. Hearing normal.  Neck: Supple, thyroid not palpable. No bruits, nodes or JVD. Respiratory: Respiratory effort normal.  BS equal and clear bilateral without rales, rhonci, wheezing or stridor. Cardio: Heart sounds are normal with regular rate and rhythm and no murmurs, rubs or gallops. Peripheral pulses are normal and equal bilaterally without edema. No aortic or femoral bruits. Chest: symmetric with normal excursions and percussion.  Abdomen: Soft, with Nl bowel sounds. Nontender, no guarding, rebound, hernias, masses, or organomegaly.  Lymphatics: Non tender without lymphadenopathy.  Genitourinary: No hernias.Testes nl. DRE - prostate nl for age - smooth & firm w/o nodules. Musculoskeletal: Full ROM all peripheral extremities, joint stability, 5/5 strength, and normal gait. Skin: Warm and dry without rashes, lesions, cyanosis, clubbing or  ecchymosis.  Neuro: Cranial nerves intact, reflexes equal bilaterally. Normal muscle tone, no cerebellar symptoms. Sensation intact to touch, vibratory and Monofilament to the toes. Pysch: Alert and oriented X 3 with normal affect, insight and judgment appropriate.   Assessment and Plan  1. Essential hypertension  - EKG 12-Lead - [place], RETROPERITNL ABD,  LTD - Urinalysis, Routine w reflex microscopic - Microalbumin / creatinine urine ratio - CBC with Differential/Platelet - COMPLETE METABOLIC PANEL WITH GFR - Magnesium - TSH  2. Hyperlipidemia, mixed  - EKG 12-Lead - [place], RETROPERITNL ABD,  LTD - COMPLETE METABOLIC PANEL WITH GFR - Lipid panel - TSH  3. Type 2 diabetes mellitus with stage 2 chronic kidney disease, without long-term  current use of insulin (HCC)  - Recommended restart his Metformin  - EKG 12-Lead - [place], RETROPERITNL ABD,  LTD - Urinalysis, Routine w reflex microscopic - Microalbumin / creatinine urine ratio  - HM DIABETES FOOT EXAM - LOW EXTREMITY NEUR EXAM DOCUM - Hemoglobin A1c - Insulin, random  4. Vitamin D deficiency  - VITAMIN D 25 Hydroxyl  5. Gastroesophageal reflux disease  - CBC with Differential/Platelet  6. Idiopathic gout  - Uric acid  7. Screening for colorectal cancer  - POC Hemoccult Bld/Stl  8. Bladder neck obstruction  - PSA  9. 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 Prostate cancer screening - PSA  11. Former smoker  - EKG 12-Lead - [place], RETROPERITNL ABD,  LTD  12. FHx: heart disease  - EKG 12-Lead - [place], RETROPERITNL ABD,  LTD  13. Screening for ischemic heart disease  - EKG 12-Lead  14. Hereditary hemochromatosis ([place])  15. Screening for AAA (aortic abdominal aneurysm) - [place], RETROPERITNL ABD,  LTD  16. Medication management  - Urinalysis, Routine w reflex microscopic - Microalbumin / creatinine urine ratio - Uric acid - CBC with Differential/Platelet - COMPLETE METABOLIC PANEL WITH GFR - Magnesium - Lipid panel - TSH - Hemoglobin A1c - Insulin, random - VITAMIN D 25 Hydroxyl               Patient was counseled in prudent diet, weight control to achieve/maintain BMI less than 25, BP monitoring, regular exercise and medications as discussed.  Discussed med effects and SE's. Routine screening labs and tests as requested with regular follow-up as recommended. Over 40 minutes of exam, counseling, chart review and high complex critical decision making was performed

## 2018-02-08 ENCOUNTER — Ambulatory Visit (INDEPENDENT_AMBULATORY_CARE_PROVIDER_SITE_OTHER): Payer: Medicare Other | Admitting: Internal Medicine

## 2018-02-08 VITALS — BP 142/84 | HR 64 | Temp 97.4°F | Resp 18 | Ht 65.0 in | Wt 196.2 lb

## 2018-02-08 DIAGNOSIS — N182 Chronic kidney disease, stage 2 (mild): Secondary | ICD-10-CM

## 2018-02-08 DIAGNOSIS — E1122 Type 2 diabetes mellitus with diabetic chronic kidney disease: Secondary | ICD-10-CM

## 2018-02-08 DIAGNOSIS — Z125 Encounter for screening for malignant neoplasm of prostate: Secondary | ICD-10-CM | POA: Diagnosis not present

## 2018-02-08 DIAGNOSIS — M1000 Idiopathic gout, unspecified site: Secondary | ICD-10-CM | POA: Diagnosis not present

## 2018-02-08 DIAGNOSIS — E782 Mixed hyperlipidemia: Secondary | ICD-10-CM

## 2018-02-08 DIAGNOSIS — I10 Essential (primary) hypertension: Secondary | ICD-10-CM | POA: Diagnosis not present

## 2018-02-08 DIAGNOSIS — Z1212 Encounter for screening for malignant neoplasm of rectum: Secondary | ICD-10-CM

## 2018-02-08 DIAGNOSIS — Z8249 Family history of ischemic heart disease and other diseases of the circulatory system: Secondary | ICD-10-CM

## 2018-02-08 DIAGNOSIS — N320 Bladder-neck obstruction: Secondary | ICD-10-CM | POA: Diagnosis not present

## 2018-02-08 DIAGNOSIS — Z136 Encounter for screening for cardiovascular disorders: Secondary | ICD-10-CM | POA: Diagnosis not present

## 2018-02-08 DIAGNOSIS — E113293 Type 2 diabetes mellitus with mild nonproliferative diabetic retinopathy without macular edema, bilateral: Secondary | ICD-10-CM

## 2018-02-08 DIAGNOSIS — E559 Vitamin D deficiency, unspecified: Secondary | ICD-10-CM | POA: Diagnosis not present

## 2018-02-08 DIAGNOSIS — Z79899 Other long term (current) drug therapy: Secondary | ICD-10-CM | POA: Diagnosis not present

## 2018-02-08 DIAGNOSIS — K219 Gastro-esophageal reflux disease without esophagitis: Secondary | ICD-10-CM | POA: Diagnosis not present

## 2018-02-08 DIAGNOSIS — Z1211 Encounter for screening for malignant neoplasm of colon: Secondary | ICD-10-CM

## 2018-02-08 DIAGNOSIS — Z87891 Personal history of nicotine dependence: Secondary | ICD-10-CM

## 2018-02-08 MED ORDER — AZITHROMYCIN 250 MG PO TABS: ORAL_TABLET | ORAL | 1 refills | Status: DC

## 2018-02-08 MED ORDER — PREDNISONE 10 MG PO TABS: ORAL_TABLET | ORAL | 0 refills | Status: DC

## 2018-02-09 LAB — CBC WITH DIFFERENTIAL/PLATELET
BASOS PCT: 0.6 %
Basophils Absolute: 43 cells/uL (ref 0–200)
EOS PCT: 3.2 %
Eosinophils Absolute: 227 cells/uL (ref 15–500)
HEMATOCRIT: 40.7 % (ref 38.5–50.0)
Hemoglobin: 14.6 g/dL (ref 13.2–17.1)
LYMPHS ABS: 1541 {cells}/uL (ref 850–3900)
MCH: 32 pg (ref 27.0–33.0)
MCHC: 35.9 g/dL (ref 32.0–36.0)
MCV: 89.3 fL (ref 80.0–100.0)
MPV: 10.2 fL (ref 7.5–12.5)
Monocytes Relative: 8.6 %
NEUTROS ABS: 4679 {cells}/uL (ref 1500–7800)
Neutrophils Relative %: 65.9 %
Platelets: 188 10*3/uL (ref 140–400)
RBC: 4.56 10*6/uL (ref 4.20–5.80)
RDW: 12.5 % (ref 11.0–15.0)
Total Lymphocyte: 21.7 %
WBC mixed population: 611 cells/uL (ref 200–950)
WBC: 7.1 10*3/uL (ref 3.8–10.8)

## 2018-02-09 LAB — COMPLETE METABOLIC PANEL WITH GFR
AG Ratio: 2.1 (calc) (ref 1.0–2.5)
ALKALINE PHOSPHATASE (APISO): 40 U/L (ref 40–115)
ALT: 17 U/L (ref 9–46)
AST: 15 U/L (ref 10–35)
Albumin: 4.6 g/dL (ref 3.6–5.1)
BUN: 15 mg/dL (ref 7–25)
CO2: 29 mmol/L (ref 20–32)
Calcium: 9.7 mg/dL (ref 8.6–10.3)
Chloride: 103 mmol/L (ref 98–110)
Creat: 1.01 mg/dL (ref 0.70–1.18)
GFR, Est African American: 86 mL/min/{1.73_m2} (ref >=60)
GFR, Est Non African American: 74 mL/min/{1.73_m2} (ref >=60)
GLOBULIN: 2.2 g/dL (ref 1.9–3.7)
GLUCOSE: 174 mg/dL — AB (ref 65–99)
Potassium: 3.8 mmol/L (ref 3.5–5.3)
SODIUM: 141 mmol/L (ref 135–146)
Total Bilirubin: 1 mg/dL (ref 0.2–1.2)
Total Protein: 6.8 g/dL (ref 6.1–8.1)

## 2018-02-09 LAB — MICROALBUMIN / CREATININE URINE RATIO
Creatinine, Urine: 29 mg/dL (ref 20–320)
Microalb Creat Ratio: 24 mcg/mg creat (ref <30)
Microalb, Ur: 0.7 mg/dL

## 2018-02-09 LAB — TSH: TSH: 3.24 mIU/L (ref 0.40–4.50)

## 2018-02-09 LAB — URINALYSIS, ROUTINE W REFLEX MICROSCOPIC
BILIRUBIN URINE: NEGATIVE
GLUCOSE, UA: NEGATIVE
Hgb urine dipstick: NEGATIVE
KETONES UR: NEGATIVE
Leukocytes, UA: NEGATIVE
Nitrite: NEGATIVE
PROTEIN: NEGATIVE
Specific Gravity, Urine: 1.012 (ref 1.001–1.03)
pH: <=5 (ref 5.0–8.0)

## 2018-02-09 LAB — HEMOGLOBIN A1C
EAG (MMOL/L): 10.6 (calc)
Hgb A1c MFr Bld: 8.3 % of total Hgb — ABNORMAL HIGH (ref <5.7)
MEAN PLASMA GLUCOSE: 192 (calc)

## 2018-02-09 LAB — URIC ACID: URIC ACID, SERUM: 4.4 mg/dL (ref 4.0–8.0)

## 2018-02-09 LAB — LIPID PANEL
CHOLESTEROL: 117 mg/dL (ref <200)
HDL: 41 mg/dL (ref >40)
LDL CHOLESTEROL (CALC): 62 mg/dL
Non-HDL Cholesterol (Calc): 76 mg/dL (calc) (ref <130)
Total CHOL/HDL Ratio: 2.9 (calc) (ref <5.0)
Triglycerides: 67 mg/dL (ref <150)

## 2018-02-09 LAB — MAGNESIUM: MAGNESIUM: 1.8 mg/dL (ref 1.5–2.5)

## 2018-02-09 LAB — INSULIN, RANDOM: Insulin: 7 u[IU]/mL (ref 2.0–19.6)

## 2018-02-09 LAB — VITAMIN D 25 HYDROXY (VIT D DEFICIENCY, FRACTURES): VIT D 25 HYDROXY: 74 ng/mL (ref 30–100)

## 2018-02-09 LAB — PSA: PSA: 0.5 ng/mL (ref <=4.0)

## 2018-02-10 ENCOUNTER — Encounter: Payer: Self-pay | Admitting: Internal Medicine

## 2018-02-12 ENCOUNTER — Encounter: Payer: Self-pay | Admitting: *Deleted

## 2018-03-05 ENCOUNTER — Other Ambulatory Visit: Payer: Self-pay

## 2018-03-05 DIAGNOSIS — Z1212 Encounter for screening for malignant neoplasm of rectum: Principal | ICD-10-CM

## 2018-03-05 DIAGNOSIS — Z1211 Encounter for screening for malignant neoplasm of colon: Secondary | ICD-10-CM

## 2018-03-05 LAB — POC HEMOCCULT BLD/STL (HOME/3-CARD/SCREEN)
FECAL OCCULT BLD: NEGATIVE
FECAL OCCULT BLD: NEGATIVE
Fecal Occult Blood, POC: NEGATIVE

## 2018-03-06 DIAGNOSIS — Z1212 Encounter for screening for malignant neoplasm of rectum: Secondary | ICD-10-CM | POA: Diagnosis not present

## 2018-03-13 ENCOUNTER — Ambulatory Visit: Payer: Self-pay | Admitting: Adult Health

## 2018-03-27 ENCOUNTER — Other Ambulatory Visit: Payer: Self-pay | Admitting: Internal Medicine

## 2018-03-27 ENCOUNTER — Other Ambulatory Visit: Payer: Self-pay | Admitting: Adult Health

## 2018-03-27 DIAGNOSIS — I10 Essential (primary) hypertension: Secondary | ICD-10-CM

## 2018-03-29 ENCOUNTER — Inpatient Hospital Stay: Payer: Medicare Other | Attending: Oncology

## 2018-05-14 NOTE — Progress Notes (Signed)
FOLLOW UP  Assessment and Plan:   Hypertension Well controlled with current medications, increasing lasix for edema Monitor blood pressure at home; patient to call if consistently greater than 130/80 Continue DASH diet.   Reminder to go to the ER if any CP, SOB, nausea, dizziness, severe HA, changes vision/speech, left arm numbness and tingling and jaw pain.  Cholesterol Currently at goal; continue atorvastatin 20 mg daily Continue low cholesterol diet and exercise.  Check lipid panel.   Diabetes with diabetic chronic kidney disease Continue medication: metformin, novolin 70/30 25 units AM, 10 units at lunch if elevated, 10-15 units PM Continue diet and exercise.  Perform daily foot/skin check, notify office of any concerning changes.  Check A1C  Obesity with co morbidities Long discussion about weight loss, diet, and exercise Recommended diet heavy in fruits and veggies and low in animal meats, cheeses, and dairy products, appropriate calorie intake Discussed ideal weight for height  Patient will work on cutting down on steak size (recommended reduce to 6 oz from 22 oz), emphasized needs to start walking 30 min daily  Will follow up in 3 months  Gout Continue allopurinol Diet discussed Check uric acid as needed  Vitamin D Def At goal at last visit; continue supplementation to maintain goal of 70-100 Defer Vit D level  Peripheral edema Increase lasix to 40 mg daily - elevate legs TID, increase activity, increase water, decrease sodium intake.  Wear compression socks more routinely if available. Return to the office if no change with symptoms. Check labs.  Continue diet and meds as discussed. Further disposition pending results of labs. Discussed med's effects and SE's.   Over 30 minutes of exam, counseling, chart review, and critical decision making was performed.   Future Appointments  Date Time Provider [place]  07/09/2018  8:30 AM [name], MD  [facility] None  07/26/2018 11:00 AM [facility] 3 [facility] None  07/26/2018 11:30 AM [name], MD [facility] None  08/15/2018  9:30 AM [name], MD [facility] None  03/07/2019  9:00 AM [name], MD [facility] None    ----------------------------------------------------------------------------------------------------------------------  HPI 72 y.o. male  presents for 3 month follow up on hypertension, cholesterol, diabetes, obesity and vitamin D deficiency.   BMI is Body mass index is 33.61 kg/m., he has been working on diet, he admits he has not been exercising much due to heat. He reports his granddaughter feeds him salads and chicken.  Wt Readings from Last 3 Encounters:  05/16/18 202 lb (91.6 kg)  02/08/18 196 lb 3.2 oz (89 kg)  12/22/17 196 lb (88.9 kg)   His blood pressure has been controlled at home, today their BP is BP: 136/70  He does not workout. He denies chest pain, shortness of breath, dizziness.   He is on cholesterol medication (atorvastatin 20 mg every other day) and denies myalgias. His cholesterol is at goal. The cholesterol last visit was:   Lab Results  Component Value Date   CHOL 117 02/08/2018   HDL 41 02/08/2018   LDLCALC 62 02/08/2018   TRIG 67 02/08/2018   CHOLHDL 2.9 02/08/2018    He has been working on diet for T2 diabetes (on metformin and novolin 70/30, 25 units AM, may take 10 units at noon time if running very high (above 250) and takes another 10-15 units PM depending on sugar reading), and denies foot ulcerations, increased appetite, nausea, paresthesia of the feet, polydipsia, polyuria, visual disturbances, vomiting and weight loss. He reports fasting glucose  has been running 90-130 with rare outliers being higher. He denies any hypoglycemic episodes Last A1C in the office was:  Lab Results  Component Value Date   HGBA1C 8.3 (H) 02/08/2018   Patient is on Vitamin D supplement.   Lab Results  Component Value Date    VD25OH 74 02/08/2018     Patient is on allopurinol for gout and does not report a recent flare.  Lab Results  Component Value Date   LABURIC 4.4 02/08/2018      Current Medications:  Current Outpatient Medications on File Prior to Visit  Medication Sig  . allopurinol (ZYLOPRIM) 300 MG tablet TAKE 1 TABLET BY MOUTH  DAILY  . aspirin 81 MG tablet Take 81 mg by mouth daily.    Marland Kitchen atorvastatin (LIPITOR) 20 MG tablet TAKE 1 TABLET BY MOUTH  DAILY  . azithromycin (ZITHROMAX) 250 MG tablet Take 2 tablets (500 mg) on  Day 1,  followed by 1 tablet (250 mg) once daily on Days 2 through 5.  . BESIVANCE 0.6 % SUSP   . bisoprolol-hydrochlorothiazide (ZIAC) 10-6.25 MG tablet TAKE 1 TABLET BY MOUTH  DAILY  . Cholecalciferol (VITAMIN D) 1000 UNITS capsule Take 5,000 Units by mouth daily.   . cyclopentolate (CYCLODRYL,CYCLOGYL) 1 % ophthalmic solution   . diphenhydramine-acetaminophen (TYLENOL PM) 25-500 MG TABS tablet Take 1 tablet by mouth at bedtime as needed.   . furosemide (LASIX) 20 MG tablet TAKE 1 TABLET BY MOUTH  EVERY DAY FOR BLOOD  PRESSURE AND FLUID  . glucose blood test strip Use as instructed  . insulin NPH-regular Human (NOVOLIN 70/30) (70-30) 100 UNIT/ML injection Inject 20 units in the AM [place] with food, and inject 10 units [place] with evening meal.  . Insulin Pen Needle 31G X 8 MM MISC Use one daily [place]  . Insulin Syringe-Needle U-100 31G X 15/64" 0.5 ML MISC Use two pens daily with insulin  . Loratadine (CLARITIN PO) Take by mouth daily.    Marland Kitchen losartan (COZAAR) 100 MG tablet TAKE 1 TABLET BY MOUTH  DAILY  . magnesium oxide (MAG-OX) 400 MG tablet Take 400 mg by mouth 2 (two) times daily.   . metFORMIN (GLUCOPHAGE-XR) 500 MG 24 hr tablet TAKE 2 TABLETS BY MOUTH TWO TIMES DAILY  . minoxidil (LONITEN) 10 MG tablet TAKE 1 TABLET BY MOUTH  DAILY  . minoxidil (LONITEN) 10 MG tablet TAKE 1 TABLET BY MOUTH  DAILY  . montelukast (SINGULAIR) 10 MG tablet TAKE 1 TABLET BY MOUTH  EVERY NIGHT AT BEDTIME   . Polyethyl Glycol-Propyl Glycol (SYSTANE OP) Apply to eye as needed.  . predniSONE (DELTASONE) 10 MG tablet 1 tab 3 x day for 2 days, then 1 tab 2 x day for 2 days, then 1 tab 1 x day for 3 days  . ranitidine (ZANTAC) 300 MG tablet TAKE 1 TABLET BY MOUTH AT  BEDTIME   No current facility-administered medications on file prior to visit.      Allergies: No Known Allergies   Medical History:  Past Medical History:  Diagnosis Date  . Allergy   . Colon polyp   . Diabetes mellitus without complication ([place])   . Dilated cardiomyopathy ([place])    non isch  . Gout   . Hemochromatosis   . Hypertension   . OSA (obstructive sleep apnea)    Family history- Reviewed and unchanged Social history- Reviewed and unchanged   Review of Systems:  Review of Systems  Constitutional: Negative for malaise/fatigue and weight loss.  HENT: Negative for hearing loss and tinnitus.   Eyes: Negative for blurred vision and double vision.  Respiratory: Negative for cough, shortness of breath and wheezing.   Cardiovascular: Positive for leg swelling. Negative for chest pain, palpitations, orthopnea and claudication.  Gastrointestinal: Negative for abdominal pain, blood in stool, constipation, diarrhea, heartburn, melena, nausea and vomiting.  Genitourinary: Negative.   Musculoskeletal: Negative for joint pain and myalgias.  Skin: Negative for rash.  Neurological: Negative for dizziness, tingling, sensory change, weakness and headaches.  Endo/Heme/Allergies: Negative for polydipsia.  Psychiatric/Behavioral: Negative.   All other systems reviewed and are negative.     Physical Exam: BP 136/70   Pulse 64   Temp (!) 97.5 F (36.4 C)   Ht 5\' 5"  (1.651 m)   Wt 202 lb (91.6 kg)   SpO2 96%   BMI 33.61 kg/m  Wt Readings from Last 3 Encounters:  05/16/18 202 lb (91.6 kg)  02/08/18 196 lb 3.2 oz (89 kg)  12/22/17 196 lb (88.9 kg)   General Appearance: Well nourished, in no apparent distress. Eyes:  PERRLA, EOMs, conjunctiva no swelling or erythema Sinuses: No Frontal/maxillary tenderness ENT/Mouth: Ext aud canals clear, TMs without erythema, bulging. No erythema, swelling, or exudate on post pharynx.  Tonsils not swollen or erythematous. Hearing normal.  Neck: Supple, thyroid normal.  Respiratory: Respiratory effort normal, BS equal bilaterally without rales, rhonchi, wheezing or stridor.  Cardio: RRR with no MRGs. Brisk peripheral pulses with 1+ pitting edema bilateral ankles and feet  Abdomen: Soft, + BS.  Non tender, no guarding, rebound, hernias, masses. Lymphatics: Non tender without lymphadenopathy.  Musculoskeletal: Full ROM, 5/5 strength, Normal gait Skin: Warm, dry without rashes, lesions, ecchymosis.  Neuro: Cranial nerves intact. No cerebellar symptoms.  Psych: Awake and oriented X 3, normal affect, Insight and Judgment appropriate.    Izora Ribas, NP 9:41 AM [facility]

## 2018-05-16 ENCOUNTER — Ambulatory Visit (INDEPENDENT_AMBULATORY_CARE_PROVIDER_SITE_OTHER): Payer: Medicare Other | Admitting: Adult Health

## 2018-05-16 ENCOUNTER — Encounter: Payer: Self-pay | Admitting: Adult Health

## 2018-05-16 VITALS — BP 136/70 | HR 64 | Temp 97.5°F | Ht 65.0 in | Wt 202.0 lb

## 2018-05-16 DIAGNOSIS — E782 Mixed hyperlipidemia: Secondary | ICD-10-CM

## 2018-05-16 DIAGNOSIS — E559 Vitamin D deficiency, unspecified: Secondary | ICD-10-CM

## 2018-05-16 DIAGNOSIS — Z79899 Other long term (current) drug therapy: Secondary | ICD-10-CM

## 2018-05-16 DIAGNOSIS — E119 Type 2 diabetes mellitus without complications: Secondary | ICD-10-CM

## 2018-05-16 DIAGNOSIS — E669 Obesity, unspecified: Secondary | ICD-10-CM | POA: Diagnosis not present

## 2018-05-16 DIAGNOSIS — I10 Essential (primary) hypertension: Secondary | ICD-10-CM

## 2018-05-16 DIAGNOSIS — E1122 Type 2 diabetes mellitus with diabetic chronic kidney disease: Secondary | ICD-10-CM

## 2018-05-16 DIAGNOSIS — M1000 Idiopathic gout, unspecified site: Secondary | ICD-10-CM | POA: Diagnosis not present

## 2018-05-16 DIAGNOSIS — N182 Chronic kidney disease, stage 2 (mild): Secondary | ICD-10-CM

## 2018-05-16 MED ORDER — FUROSEMIDE 40 MG PO TABS: 40.0000 mg | ORAL_TABLET | Freq: Every day | ORAL | 1 refills | Status: DC

## 2018-05-16 NOTE — Patient Instructions (Signed)
Drink 80+ fluid ounces of water daily  Start walking 30 min daily - go early before it gets too hot       drink a tall glass of water 30 min before meals  Don't eat until you're stuffed- listen to your stomach and eat until you are 80% full   Try eating off of a salad plate; wait 10 min after finishing before going back for seconds  Start by eating the vegetables on your plate; aim for 50% of your meals to be fruits or vegetables  Then eat your protein - lean meats (grass fed if possible), fish, beans, nuts in moderation  Eat your carbs/starch last ONLY if you still are hungry. If you can, stop before finishing it all  Avoid sugar and flour - the closer it looks to it's original form in nature, typically the better it is for you  Splurge in moderation - "assign" days when you get to splurge and have the "bad stuff" - I like to follow a 80% - 20% plan- "good" choices 80 % of the time, "bad" choices in moderation 20% of the time  Simple equation is: Calories out > calories in = weight loss - even if you eat the bad stuff, if you limit portions, you will still lose weight        Edema Edema is when you have too much fluid in your body or under your skin. Edema may make your legs, feet, and ankles swell up. Swelling is also common in looser tissues, like around your eyes. This is a common condition. It gets more common as you get older. There are many possible causes of edema. Eating too much salt (sodium) and being on your feet or sitting for a long time can cause edema in your legs, feet, and ankles. Hot weather may make edema worse. Edema is usually painless. Your skin may look swollen or shiny. Follow these instructions at home:  Keep the swollen body part raised (elevated) above the level of your heart when you are sitting or lying down.  Do not sit still or stand for a long time.  Do not wear tight clothes. Do not wear garters on your upper legs.  Exercise your legs.  This can help the swelling go down.  Wear elastic bandages or support stockings as told by your doctor.  Eat a low-salt (low-sodium) diet to reduce fluid as told by your doctor.  Depending on the cause of your swelling, you may need to limit how much fluid you drink (fluid restriction).  Take over-the-counter and prescription medicines only as told by your doctor. Contact a doctor if:  Treatment is not working.  You have heart, liver, or kidney disease and have symptoms of edema.  You have sudden and unexplained weight gain. Get help right away if:  You have shortness of breath or chest pain.  You cannot breathe when you lie down.  You have pain, redness, or warmth in the swollen areas.  You have heart, liver, or kidney disease and get edema all of a sudden.  You have a fever and your symptoms get worse all of a sudden. Summary  Edema is when you have too much fluid in your body or under your skin.  Edema may make your legs, feet, and ankles swell up. Swelling is also common in looser tissues, like around your eyes.  Raise (elevate) the swollen body part above the level of your heart when you are sitting or lying  down.  Follow your doctor's instructions about diet and how much fluid you can drink (fluid restriction). This information is not intended to replace advice given to you by your health care provider. Make sure you discuss any questions you have with your health care provider. Document Released: 03/28/2008 Document Revised: 10/28/2016 Document Reviewed: 10/28/2016 Elsevier Interactive Patient Education  2017 [employer].

## 2018-05-17 ENCOUNTER — Other Ambulatory Visit: Payer: Self-pay

## 2018-05-17 DIAGNOSIS — E119 Type 2 diabetes mellitus without complications: Secondary | ICD-10-CM

## 2018-05-17 LAB — CBC WITH DIFFERENTIAL/PLATELET
Basophils Absolute: 58 cells/uL (ref 0–200)
Basophils Relative: 0.8 %
EOS PCT: 2.4 %
Eosinophils Absolute: 173 cells/uL (ref 15–500)
HCT: 42.3 % (ref 38.5–50.0)
HEMOGLOBIN: 15.3 g/dL (ref 13.2–17.1)
Lymphs Abs: 1613 cells/uL (ref 850–3900)
MCH: 32.8 pg (ref 27.0–33.0)
MCHC: 36.2 g/dL — ABNORMAL HIGH (ref 32.0–36.0)
MCV: 90.8 fL (ref 80.0–100.0)
MPV: 10.2 fL (ref 7.5–12.5)
Monocytes Relative: 9.2 %
Neutro Abs: 4694 cells/uL (ref 1500–7800)
Neutrophils Relative %: 65.2 %
Platelets: 192 10*3/uL (ref 140–400)
RBC: 4.66 10*6/uL (ref 4.20–5.80)
RDW: 13.1 % (ref 11.0–15.0)
Total Lymphocyte: 22.4 %
WBC mixed population: 662 cells/uL (ref 200–950)
WBC: 7.2 10*3/uL (ref 3.8–10.8)

## 2018-05-17 LAB — LIPID PANEL
Cholesterol: 129 mg/dL (ref <200)
HDL: 46 mg/dL (ref >40)
LDL Cholesterol (Calc): 57 mg/dL (calc)
Non-HDL Cholesterol (Calc): 83 mg/dL (calc) (ref <130)
Total CHOL/HDL Ratio: 2.8 (calc) (ref <5.0)
Triglycerides: 179 mg/dL — ABNORMAL HIGH (ref <150)

## 2018-05-17 LAB — COMPLETE METABOLIC PANEL WITH GFR
AG RATIO: 2.2 (calc) (ref 1.0–2.5)
ALKALINE PHOSPHATASE (APISO): 37 U/L — AB (ref 40–115)
ALT: 22 U/L (ref 9–46)
AST: 17 U/L (ref 10–35)
Albumin: 4.7 g/dL (ref 3.6–5.1)
BILIRUBIN TOTAL: 0.8 mg/dL (ref 0.2–1.2)
BUN: 20 mg/dL (ref 7–25)
CHLORIDE: 101 mmol/L (ref 98–110)
CO2: 33 mmol/L — ABNORMAL HIGH (ref 20–32)
CREATININE: 1.05 mg/dL (ref 0.70–1.18)
Calcium: 10 mg/dL (ref 8.6–10.3)
GFR, Est African American: 82 mL/min/{1.73_m2} (ref >=60)
GFR, Est Non African American: 71 mL/min/{1.73_m2} (ref >=60)
GLOBULIN: 2.1 g/dL (ref 1.9–3.7)
Glucose, Bld: 134 mg/dL — ABNORMAL HIGH (ref 65–99)
POTASSIUM: 4.2 mmol/L (ref 3.5–5.3)
SODIUM: 139 mmol/L (ref 135–146)
Total Protein: 6.8 g/dL (ref 6.1–8.1)

## 2018-05-17 LAB — MAGNESIUM: MAGNESIUM: 2.1 mg/dL (ref 1.5–2.5)

## 2018-05-17 LAB — HEMOGLOBIN A1C
HEMOGLOBIN A1C: 7.3 %{Hb} — AB (ref <5.7)
MEAN PLASMA GLUCOSE: 163 (calc)
eAG (mmol/L): 9 (calc)

## 2018-05-17 LAB — TSH: TSH: 2.62 m[IU]/L (ref 0.40–4.50)

## 2018-05-17 MED ORDER — INSULIN NPH ISOPHANE & REGULAR (70-30) 100 UNIT/ML ~~LOC~~ SUSP: SUBCUTANEOUS | 4 refills | Status: DC

## 2018-06-27 ENCOUNTER — Other Ambulatory Visit: Payer: Self-pay

## 2018-06-27 MED ORDER — FUROSEMIDE 40 MG PO TABS: 40.0000 mg | ORAL_TABLET | Freq: Every day | ORAL | 0 refills | Status: DC

## 2018-06-27 MED ORDER — FUROSEMIDE 40 MG PO TABS: 40.0000 mg | ORAL_TABLET | Freq: Every day | ORAL | 1 refills | Status: DC

## 2018-06-27 NOTE — Telephone Encounter (Signed)
Patient needs a two week supply for Lasix be sent to [facility] until his mail order sends out rx

## 2018-07-09 ENCOUNTER — Encounter (INDEPENDENT_AMBULATORY_CARE_PROVIDER_SITE_OTHER): Payer: Medicare Other | Admitting: Ophthalmology

## 2018-07-09 DIAGNOSIS — H43813 Vitreous degeneration, bilateral: Secondary | ICD-10-CM | POA: Diagnosis not present

## 2018-07-09 DIAGNOSIS — E11319 Type 2 diabetes mellitus with unspecified diabetic retinopathy without macular edema: Secondary | ICD-10-CM | POA: Diagnosis not present

## 2018-07-09 DIAGNOSIS — I10 Essential (primary) hypertension: Secondary | ICD-10-CM | POA: Diagnosis not present

## 2018-07-09 DIAGNOSIS — E113393 Type 2 diabetes mellitus with moderate nonproliferative diabetic retinopathy without macular edema, bilateral: Secondary | ICD-10-CM

## 2018-07-09 DIAGNOSIS — H26491 Other secondary cataract, right eye: Secondary | ICD-10-CM | POA: Diagnosis not present

## 2018-07-09 DIAGNOSIS — H35033 Hypertensive retinopathy, bilateral: Secondary | ICD-10-CM | POA: Diagnosis not present

## 2018-07-11 ENCOUNTER — Other Ambulatory Visit: Payer: Self-pay | Admitting: Internal Medicine

## 2018-07-25 ENCOUNTER — Telehealth: Payer: Self-pay | Admitting: Oncology

## 2018-07-25 NOTE — Telephone Encounter (Signed)
Returned pts call to r/s appts   °

## 2018-07-26 ENCOUNTER — Inpatient Hospital Stay: Payer: Medicare Other | Admitting: Oncology

## 2018-07-26 ENCOUNTER — Inpatient Hospital Stay: Payer: Medicare Other

## 2018-08-01 ENCOUNTER — Encounter (INDEPENDENT_AMBULATORY_CARE_PROVIDER_SITE_OTHER): Payer: Medicare Other | Admitting: Ophthalmology

## 2018-08-01 DIAGNOSIS — H2701 Aphakia, right eye: Secondary | ICD-10-CM

## 2018-08-06 ENCOUNTER — Encounter: Payer: Self-pay | Admitting: Nurse Practitioner

## 2018-08-06 ENCOUNTER — Inpatient Hospital Stay: Payer: Medicare Other | Attending: Nurse Practitioner

## 2018-08-06 ENCOUNTER — Inpatient Hospital Stay (HOSPITAL_BASED_OUTPATIENT_CLINIC_OR_DEPARTMENT_OTHER): Payer: Medicare Other | Admitting: Nurse Practitioner

## 2018-08-06 ENCOUNTER — Telehealth: Payer: Self-pay | Admitting: Nurse Practitioner

## 2018-08-06 DIAGNOSIS — Z23 Encounter for immunization: Secondary | ICD-10-CM | POA: Diagnosis not present

## 2018-08-06 DIAGNOSIS — F17210 Nicotine dependence, cigarettes, uncomplicated: Secondary | ICD-10-CM | POA: Insufficient documentation

## 2018-08-06 DIAGNOSIS — E7800 Pure hypercholesterolemia, unspecified: Secondary | ICD-10-CM | POA: Diagnosis not present

## 2018-08-06 DIAGNOSIS — M109 Gout, unspecified: Secondary | ICD-10-CM

## 2018-08-06 DIAGNOSIS — G4730 Sleep apnea, unspecified: Secondary | ICD-10-CM | POA: Diagnosis not present

## 2018-08-06 DIAGNOSIS — I10 Essential (primary) hypertension: Secondary | ICD-10-CM

## 2018-08-06 DIAGNOSIS — E119 Type 2 diabetes mellitus without complications: Secondary | ICD-10-CM | POA: Diagnosis not present

## 2018-08-06 MED ORDER — INFLUENZA VAC SPLIT QUAD 0.5 ML IM SUSY
0.5000 mL | PREFILLED_SYRINGE | Freq: Once | INTRAMUSCULAR | Status: AC
  Administered 2018-08-06: 0.5 mL via INTRAMUSCULAR

## 2018-08-06 MED ORDER — INFLUENZA VAC SPLIT QUAD 0.5 ML IM SUSY
PREFILLED_SYRINGE | INTRAMUSCULAR | Status: AC
  Filled 2018-08-06: qty 0.5

## 2018-08-06 MED ORDER — INFLUENZA VAC SPLIT HIGH-DOSE 0.5 ML IM SUSY
0.5000 mL | PREFILLED_SYRINGE | Freq: Once | INTRAMUSCULAR | Status: DC
  Filled 2018-08-06: qty 0.5

## 2018-08-06 NOTE — Progress Notes (Signed)
  [place] OFFICE PROGRESS NOTE   Diagnosis: Hemochromatosis  INTERVAL HISTORY:   Brett [name] returns as scheduled.  He was last seen in our office approximately 1 year ago.  He did not return for scheduled lab appointments at 4 and 52-month intervals.  He reports that he feels well.  No interim illnesses or infections.  No shortness of breath.  No chest pain.  No bowel or bladder problems.  No rash.  Objective:  Vital signs in last 24 hours:  Blood pressure (!) 149/78, pulse 70, temperature 98.8 F (37.1 C), temperature source Oral, resp. rate 18, height 5\' 5"  (1.651 m), weight 211 lb 4.8 oz (95.8 kg), SpO2 100 %.   Resp: Lungs clear bilaterally. Cardio: Regular rate and rhythm. GI: No hepatospleno megaly. Vascular: Trace edema at the lower legs bilaterally.   Lab Results:  Lab Results  Component Value Date   WBC 7.2 05/16/2018   HGB 15.3 05/16/2018   HCT 42.3 05/16/2018   MCV 90.8 05/16/2018   PLT 192 05/16/2018   NEUTROABS 4,694 05/16/2018   07/27/2017 ferritin 91 Imaging:  No results found.  Medications: I have reviewed the patient's current medications.  Assessment/Plan: 1. 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 He was last treated with phlebotomy therapy on 06/25/2015 2. History of tobacco use in the remote past. 3. Hypertension. 4. Hypercholesterolemia. 5. Diabetes. 6. Gout. 7. Sleep apnea. 8. Family history of hemochromatosis.  Disposition: Brett [name] appears unchanged.  We will follow-up on the ferritin level from today.  If the ferritin is greater than 100 the plan is to initiate phlebotomy therapy.  He will receive the influenza vaccine today.  He will return for ferritin levels in 4 months and 8 months.  He will return for a follow-up visit in 1 year.    Ned Card ANP/GNP-BC   08/06/2018  2:08 PM

## 2018-08-06 NOTE — Telephone Encounter (Signed)
Scheduled appt per 10/14 los - gave patient AVS And calender per los.

## 2018-08-07 LAB — FERRITIN: FERRITIN: 65 ng/mL (ref 24–336)

## 2018-08-08 ENCOUNTER — Telehealth: Payer: Self-pay | Admitting: Emergency Medicine

## 2018-08-08 NOTE — Telephone Encounter (Addendum)
Pt verbalized understanding   ----- Message from Owens Shark, NP sent at 08/08/2018  8:40 AM EDT ----- Please let him know ferritin is in goal range; f/u as scheduled.

## 2018-08-14 NOTE — Patient Instructions (Signed)

## 2018-08-14 NOTE — Progress Notes (Signed)
This very nice 72 y.o.  MWM (pole)  presents for 6 month follow up with HTN, HLD, T2_IDDM and Vitamin D Deficiency. Patient has hx/o Gout controlled on his allopurinol. He is followed by Dr Benay Spice for Hereditary Hemochromatosis with periodic phlebotomies.       Patient is treated for HTN (1970)  & BP has been controlled at home. Today's BP is at goal - 140/74. Patient had a negative heart cath in 2006. Patient has had no complaints of any cardiac type chest pain, palpitations, dyspnea / orthopnea / PND, dizziness, claudication, or dependent edema.     Hyperlipidemia is controlled with diet & meds. Patient denies myalgias or other med SE's. Last Lipids were at goal albeit elevated trig's: Lab Results  Component Value Date   CHOL 129 05/16/2018   HDL 46 05/16/2018   LDLCALC 57 05/16/2018   TRIG 179 (H) 05/16/2018   CHOLHDL 2.8 05/16/2018      Also, the patient has history of  Insulin requiring T2_DM (2006) and has had no symptoms of reactive hypoglycemia, diabetic polys, paresthesias or visual blurring. He also has CKD2. Last A1c was not at goal: Lab Results  Component Value Date   HGBA1C 7.3 (H) 05/16/2018      Further, the patient also has history of Vitamin D Deficiency and supplements vitamin D without any suspected side-effects. Last vitamin D was at goal: Lab Results  Component Value Date   VD25OH 74 02/08/2018   Current Outpatient Medications on File Prior to Visit  Medication Sig  . allopurinol (ZYLOPRIM) 300 MG tablet TAKE 1 TABLET BY MOUTH  DAILY  . aspirin 81 MG tablet Take 81 mg by mouth daily.    Marland Kitchen atorvastatin (LIPITOR) 20 MG tablet TAKE 1 TABLET BY MOUTH  DAILY  . BESIVANCE 0.6 % SUSP   . bisoprolol-hydrochlorothiazide (ZIAC) 10-6.25 MG tablet TAKE 1 TABLET BY MOUTH  DAILY  . Cholecalciferol (VITAMIN D PO) Take 5,000 Units by mouth daily.  . cyclopentolate (CYCLODRYL,CYCLOGYL) 1 % ophthalmic solution   . diphenhydramine-acetaminophen (TYLENOL PM) 25-500 MG  TABS tablet Take 1 tablet by mouth at bedtime as needed.   . furosemide (LASIX) 40 MG tablet Take 1 tablet (40 mg total) by mouth daily.  Marland Kitchen glucose blood test strip Use as instructed  . insulin NPH-regular Human (NOVOLIN 70/30) (70-30) 100 UNIT/ML injection Inject 25 units in the morning, 10 units at lunch if elevated, 10-15 units in the evening.  . Insulin Pen Needle 31G X 8 MM MISC Use one daily [place]  . Insulin Syringe-Needle U-100 31G X 15/64" 0.5 ML MISC Use two pens daily with insulin  . Loratadine (CLARITIN PO) Take by mouth daily.    Marland Kitchen losartan (COZAAR) 100 MG tablet TAKE 1 TABLET BY MOUTH  DAILY  . magnesium oxide (MAG-OX) 400 MG tablet Take 400 mg by mouth 2 (two) times daily.   . metFORMIN (GLUCOPHAGE-XR) 500 MG 24 hr tablet TAKE 2 TABLETS BY MOUTH TWO TIMES DAILY  . minoxidil (LONITEN) 10 MG tablet TAKE 1 TABLET BY MOUTH  DAILY  . montelukast (SINGULAIR) 10 MG tablet TAKE 1 TABLET BY MOUTH  EVERY NIGHT AT BEDTIME  . Polyethyl Glycol-Propyl Glycol (SYSTANE OP) Apply to eye as needed.  . ranitidine (ZANTAC) 300 MG tablet TAKE 1 TABLET BY MOUTH AT  BEDTIME   No current facility-administered medications on file prior to visit.    No Known Allergies   PMHx:   Past Medical History:  Diagnosis  Date  . Allergy   . Colon polyp   . Diabetes mellitus without complication ([place])   . Dilated cardiomyopathy ([place])    non isch  . Gout   . Hemochromatosis   . Hypertension   . OSA (obstructive sleep apnea)    Immunization History  Administered Date(s) Administered  . DT 01/21/2014  . Influenza Split 09/11/2013  . Influenza, High Dose Seasonal PF 07/27/2017  . Influenza,inj,Quad PF,6+ Mos 07/28/2014, 08/06/2018  . Influenza-Unspecified 08/21/2012  . Pneumococcal Conjugate-13 09/11/2014  . Pneumococcal-Unspecified 08/21/2012   Past Surgical History:  Procedure Laterality Date  . EYE SURGERY Bilateral    Cataract   FHx:    Reviewed / unchanged  SHx:    Reviewed / unchanged    Systems Review:  Constitutional: Denies fever, chills, wt changes, headaches, insomnia, fatigue, night sweats, change in appetite. Eyes: Denies redness, blurred vision, diplopia, discharge, itchy, watery eyes.  ENT: Denies discharge, congestion, post nasal drip, epistaxis, sore throat, earache, hearing loss, dental pain, tinnitus, vertigo, sinus pain, snoring.  CV: Denies chest pain, palpitations, irregular heartbeat, syncope, dyspnea, diaphoresis, orthopnea, PND, claudication or edema. Respiratory: denies cough, dyspnea, DOE, pleurisy, hoarseness, laryngitis, wheezing.  Gastrointestinal: Denies dysphagia, odynophagia, heartburn, reflux, water brash, abdominal pain or cramps, nausea, vomiting, bloating, diarrhea, constipation, hematemesis, melena, hematochezia  or hemorrhoids. Genitourinary: Denies dysuria, frequency, urgency, nocturia, hesitancy, discharge, hematuria or flank pain. Musculoskeletal: Denies arthralgias, myalgias, stiffness, jt. swelling, pain, limping or strain/sprain.  Skin: Denies pruritus, rash, hives, warts, acne, eczema or change in skin lesion(s). Neuro: No weakness, tremor, incoordination, spasms, paresthesia or pain. Psychiatric: Denies confusion, memory loss or sensory loss. Endo: Denies change in weight, skin or hair change.  Heme/Lymph: No excessive bleeding, bruising or enlarged lymph nodes.  Physical Exam  BP 140/74   Pulse 72   Temp 97.8 F (36.6 C)   Resp 18   Ht 5\' 5"  (1.651 m)   Wt 213 lb 3.2 oz (96.7 kg)   BMI 35.48 kg/m   Appears  well nourished, well groomed  and in no distress.  Eyes: PERRLA, EOMs, conjunctiva no swelling or erythema. Sinuses: No frontal/maxillary tenderness ENT/Mouth: EAC's clear, TM's nl w/o erythema, bulging. Nares clear w/o erythema, swelling, exudates. Oropharynx clear without erythema or exudates. Oral hygiene is good. Tongue normal, non obstructing. Hearing intact.  Neck: Supple. Thyroid not palpable. Car 2+/2+ without  bruits, nodes or JVD. Chest: Respirations nl with BS clear & equal w/o rales, rhonchi, wheezing or stridor.  Cor: Heart sounds normal w/ regular rate and rhythm without sig. murmurs, gallops, clicks or rubs. Peripheral pulses normal and equal  without edema.  Abdomen: Soft & bowel sounds normal. Non-tender w/o guarding, rebound, hernias, masses or organomegaly.  Lymphatics: Unremarkable.  Musculoskeletal: Full ROM all peripheral extremities, joint stability, 5/5 strength and normal gait.  Skin: Warm, dry without exposed rashes, lesions or ecchymosis apparent.  Neuro: Cranial nerves intact, reflexes equal bilaterally. Sensory-motor testing grossly intact. Tendon reflexes grossly intact.  Pysch: Alert & oriented x 3.  Insight and judgement nl & appropriate. No ideations.  Assessment and Plan:  1. Essential hypertension  - Continue medication, monitor blood pressure at home.  - Continue DASH diet.  Reminder to go to the ER if any CP,  SOB, nausea, dizziness, severe HA, changes vision/speech.  - CBC with Differential/Platelet - COMPLETE METABOLIC PANEL WITH GFR - Magnesium - TSH  2. H - Continue diet/meds, exercise,& lifestyle modifications.  - Continue monitor periodic cholesterol/liver & renal functions  yperlipidemia, mixed  - Lipid panel - TSH  3. Type 2 diabetes mellitus with stage 2 chronic kidney disease, with long-term current use of insulin (HCC)  - Continue diet, exercise,  - lifestyle modifications.  - Monitor appropriate labs.  - Hemoglobin A1c  4. Vitamin D deficiency  - Continue supplementation.   - VITAMIN D 25 Hydroxyl  5. Idiopathic gout  - Uric acid  6. Hereditary hemochromatosis ([place])  - CBC with Differential/Platelet  7. Gastroesophageal reflux disease  - CBC with Differential/Platelet  8. Medication management  - CBC with Differential/Platelet - COMPLETE METABOLIC PANEL WITH GFR - Magnesium - Lipid panel - TSH - Hemoglobin A1c -  VITAMIN D 25 Hydroxyl - Uric acid       Discussed  regular exercise, BP monitoring, weight control to achieve/maintain BMI less than 25 and discussed med and SE's. Recommended labs to assess and monitor clinical status with further disposition pending results of labs. Over 30 minutes of exam, counseling, chart review was performed.

## 2018-08-15 ENCOUNTER — Encounter: Payer: Self-pay | Admitting: Internal Medicine

## 2018-08-15 ENCOUNTER — Ambulatory Visit (INDEPENDENT_AMBULATORY_CARE_PROVIDER_SITE_OTHER): Payer: Medicare Other | Admitting: Internal Medicine

## 2018-08-15 VITALS — BP 140/74 | HR 72 | Temp 97.8°F | Resp 18 | Ht 65.0 in | Wt 213.2 lb

## 2018-08-15 DIAGNOSIS — Z87891 Personal history of nicotine dependence: Secondary | ICD-10-CM | POA: Insufficient documentation

## 2018-08-15 DIAGNOSIS — M1000 Idiopathic gout, unspecified site: Secondary | ICD-10-CM

## 2018-08-15 DIAGNOSIS — E559 Vitamin D deficiency, unspecified: Secondary | ICD-10-CM | POA: Diagnosis not present

## 2018-08-15 DIAGNOSIS — E1122 Type 2 diabetes mellitus with diabetic chronic kidney disease: Secondary | ICD-10-CM | POA: Diagnosis not present

## 2018-08-15 DIAGNOSIS — E782 Mixed hyperlipidemia: Secondary | ICD-10-CM

## 2018-08-15 DIAGNOSIS — Z8249 Family history of ischemic heart disease and other diseases of the circulatory system: Secondary | ICD-10-CM | POA: Insufficient documentation

## 2018-08-15 DIAGNOSIS — K219 Gastro-esophageal reflux disease without esophagitis: Secondary | ICD-10-CM

## 2018-08-15 DIAGNOSIS — Z794 Long term (current) use of insulin: Secondary | ICD-10-CM | POA: Diagnosis not present

## 2018-08-15 DIAGNOSIS — Z79899 Other long term (current) drug therapy: Secondary | ICD-10-CM

## 2018-08-15 DIAGNOSIS — I10 Essential (primary) hypertension: Secondary | ICD-10-CM

## 2018-08-15 DIAGNOSIS — N182 Chronic kidney disease, stage 2 (mild): Secondary | ICD-10-CM

## 2018-08-16 LAB — COMPLETE METABOLIC PANEL WITH GFR
AG Ratio: 2.1 (calc) (ref 1.0–2.5)
ALT: 25 U/L (ref 9–46)
AST: 18 U/L (ref 10–35)
Albumin: 4.5 g/dL (ref 3.6–5.1)
Alkaline phosphatase (APISO): 33 U/L — ABNORMAL LOW (ref 40–115)
BUN: 20 mg/dL (ref 7–25)
CALCIUM: 9.7 mg/dL (ref 8.6–10.3)
CO2: 27 mmol/L (ref 20–32)
CREATININE: 1.02 mg/dL (ref 0.70–1.18)
Chloride: 103 mmol/L (ref 98–110)
GFR, EST AFRICAN AMERICAN: 85 mL/min/{1.73_m2} (ref >=60)
GFR, EST NON AFRICAN AMERICAN: 73 mL/min/{1.73_m2} (ref >=60)
Globulin: 2.1 g/dL (calc) (ref 1.9–3.7)
Glucose, Bld: 153 mg/dL — ABNORMAL HIGH (ref 65–99)
POTASSIUM: 3.7 mmol/L (ref 3.5–5.3)
Sodium: 143 mmol/L (ref 135–146)
Total Bilirubin: 0.8 mg/dL (ref 0.2–1.2)
Total Protein: 6.6 g/dL (ref 6.1–8.1)

## 2018-08-16 LAB — LIPID PANEL
CHOLESTEROL: 118 mg/dL (ref <200)
HDL: 44 mg/dL (ref >40)
LDL Cholesterol (Calc): 53 mg/dL (calc)
Non-HDL Cholesterol (Calc): 74 mg/dL (calc) (ref <130)
TRIGLYCERIDES: 119 mg/dL (ref <150)
Total CHOL/HDL Ratio: 2.7 (calc) (ref <5.0)

## 2018-08-16 LAB — CBC WITH DIFFERENTIAL/PLATELET
BASOS PCT: 0.6 %
Basophils Absolute: 49 cells/uL (ref 0–200)
EOS ABS: 98 {cells}/uL (ref 15–500)
Eosinophils Relative: 1.2 %
HEMATOCRIT: 40.8 % (ref 38.5–50.0)
Hemoglobin: 14.8 g/dL (ref 13.2–17.1)
LYMPHS ABS: 1706 {cells}/uL (ref 850–3900)
MCH: 32.9 pg (ref 27.0–33.0)
MCHC: 36.3 g/dL — ABNORMAL HIGH (ref 32.0–36.0)
MCV: 90.7 fL (ref 80.0–100.0)
MPV: 9.8 fL (ref 7.5–12.5)
Monocytes Relative: 7.9 %
NEUTROS PCT: 69.5 %
Neutro Abs: 5699 cells/uL (ref 1500–7800)
PLATELETS: 209 10*3/uL (ref 140–400)
RBC: 4.5 10*6/uL (ref 4.20–5.80)
RDW: 12.9 % (ref 11.0–15.0)
TOTAL LYMPHOCYTE: 20.8 %
WBC: 8.2 10*3/uL (ref 3.8–10.8)
WBCMIX: 648 {cells}/uL (ref 200–950)

## 2018-08-16 LAB — URIC ACID: URIC ACID, SERUM: 5.4 mg/dL (ref 4.0–8.0)

## 2018-08-16 LAB — VITAMIN D 25 HYDROXY (VIT D DEFICIENCY, FRACTURES): VIT D 25 HYDROXY: 64 ng/mL (ref 30–100)

## 2018-08-16 LAB — TSH: TSH: 3.22 mIU/L (ref 0.40–4.50)

## 2018-08-16 LAB — MAGNESIUM: Magnesium: 1.9 mg/dL (ref 1.5–2.5)

## 2018-08-16 LAB — HEMOGLOBIN A1C
EAG (MMOL/L): 9.7 (calc)
Hgb A1c MFr Bld: 7.7 % of total Hgb — ABNORMAL HIGH (ref <5.7)
Mean Plasma Glucose: 174 (calc)

## 2018-08-29 ENCOUNTER — Other Ambulatory Visit: Payer: Self-pay | Admitting: Internal Medicine

## 2018-08-29 DIAGNOSIS — I10 Essential (primary) hypertension: Secondary | ICD-10-CM

## 2018-11-13 ENCOUNTER — Other Ambulatory Visit: Payer: Self-pay | Admitting: Adult Health

## 2018-11-13 ENCOUNTER — Other Ambulatory Visit: Payer: Self-pay | Admitting: Internal Medicine

## 2018-11-19 NOTE — Patient Instructions (Addendum)
[name] , Thank you for taking time to come for your Medicare Wellness Visit. I appreciate your ongoing commitment to your health goals. Please review the following plan we discussed and let me know if I can assist you in the future.   These are the goals we discussed: Goals    . Exercise 150 min/wk Moderate Activity     Increase activity, walking is great for your heart health and will help with your weight.  Involve your friends or family.         This is a list of the screening recommended for you and due dates:  Health Maintenance  Topic Date Due  .  Hepatitis C: One time screening is recommended by Center for Disease Control  (CDC) for  adults born from [birth year] through 1965.   03-09-1946  . Eye exam for diabetics  01/06/2019  . Complete foot exam   02/08/2019  . Hemoglobin A1C  02/14/2019  . Colon Cancer Screening  08/24/2020  . Tetanus Vaccine  01/22/2024  . Flu Shot  Completed  . Pneumonia vaccines  Completed      We Do NOT Approve of  [facility], [employer]  Our Patients  To Do Home Visits  & We Do NOT Approve of [facility] > > > > > > > > > > > > > > > > > > > > > > > > > > > > > > > > > > >  > > > >   Preventive Care for Adults  A healthy lifestyle and preventive care can promote health and wellness. Preventive health guidelines for men include the following key practices:  A routine yearly physical is a good way to check with your health care provider about your health and preventative screening. It is a chance to share any concerns and updates on your health and to receive a thorough exam.  Visit your dentist for a routine exam and preventative care every 6 months. Brush your teeth twice a day and floss once a day. Good oral hygiene prevents tooth decay and gum disease.  The frequency of eye exams is based on your age, health, family medical history, use of contact lenses, and other factors. Follow your health care provider's  recommendations for frequency of eye exams.  Eat a healthy diet. Foods such as vegetables, fruits, whole grains, low-fat dairy products, and lean protein foods contain the nutrients you need without too many calories. Decrease your intake of foods high in solid fats, added sugars, and salt. Eat the right amount of calories for you. Get information about a proper diet from your health care provider, if necessary.  Regular physical exercise is one of the most important things you can do for your health. Most adults should get at least 150 minutes of moderate-intensity exercise (any activity that increases your heart rate and causes you to sweat) each week. In addition, most adults need muscle-strengthening exercises on 2 or more days a week.  Maintain a healthy weight. The body mass index (BMI) is a screening tool to identify possible weight problems. It provides an estimate of body fat based on height and weight. Your health care provider can find your BMI and can help you achieve or maintain a healthy weight. For adults 20 years and older:  A BMI below 18.5 is considered underweight.  A BMI of 18.5 to 24.9 is normal.  A BMI of 25 to 29.9 is considered overweight.  A BMI  of 30 and above is considered obese.  Maintain normal blood lipids and cholesterol levels by exercising and minimizing your intake of saturated fat. Eat a balanced diet with plenty of fruit and vegetables. Blood tests for lipids and cholesterol should begin at age 24 and be repeated every 5 years. If your lipid or cholesterol levels are high, you are over 50, or you are at high risk for heart disease, you may need your cholesterol levels checked more frequently. Ongoing high lipid and cholesterol levels should be treated with medicines if diet and exercise are not working.  If you smoke, find out from your health care provider how to quit. If you do not use tobacco, do not start.  Lung cancer screening is recommended for adults  aged 66-80 years who are at high risk for developing lung cancer because of a history of smoking. A yearly low-dose CT scan of the lungs is recommended for people who have at least a 30-pack-year history of smoking and are a current smoker or have quit within the past 15 years. A pack year of smoking is smoking an average of 1 pack of cigarettes a day for 1 year (for example: 1 pack a day for 30 years or 2 packs a day for 15 years). Yearly screening should continue until the smoker has stopped smoking for at least 15 years. Yearly screening should be stopped for people who develop a health problem that would prevent them from having lung cancer treatment.  If you choose to drink alcohol, do not have more than 2 drinks per day. One drink is considered to be 12 ounces (355 mL) of beer, 5 ounces (148 mL) of wine, or 1.5 ounces (44 mL) of liquor.  Avoid use of street drugs. Do not share needles with anyone. Ask for help if you need support or instructions about stopping the use of drugs.  High blood pressure causes heart disease and increases the risk of stroke. Your blood pressure should be checked at least every 1-2 years. Ongoing high blood pressure should be treated with medicines, if weight loss and exercise are not effective.  If you are 57-54 years old, ask your health care provider if you should take aspirin to prevent heart disease.  Diabetes screening involves taking a blood sample to check your fasting blood sugar level. Testing should be considered at a younger age or be carried out more frequently if you are overweight and have at least 1 risk factor for diabetes.  Colorectal cancer can be detected and often prevented. Most routine colorectal cancer screening begins at the age of 46 and continues through age 77. However, your health care provider may recommend screening at an earlier age if you have risk factors for colon cancer. On a yearly basis, your health care provider may provide home test  kits to check for hidden blood in the stool. Use of a small camera at the end of a tube to directly examine the colon (sigmoidoscopy or colonoscopy) can detect the earliest forms of colorectal cancer. Talk to your health care provider about this at age 11, when routine screening begins. Direct exam of the colon should be repeated every 5-10 years through age 45, unless early forms of precancerous polyps or small growths are found.  Hepatitis C blood testing is recommended for all people born from [birth year] through 1965 and any individual with known risks for hepatitis C.  Screening for abdominal aortic aneurysm (AAA)  by ultrasound is recommended for people who  have history of high blood pressure or who are current or former smokers.  Healthy men should  receive prostate-specific antigen (PSA) blood tests as part of routine cancer screening. Talk with your health care provider about prostate cancer screening.  Testicular cancer screening is  recommended for adult males. Screening includes self-exam, a health care provider exam, and other screening tests. Consult with your health care provider about any symptoms you have or any concerns you have about testicular cancer.  Use sunscreen. Apply sunscreen liberally and repeatedly throughout the day. You should seek shade when your shadow is shorter than you. Protect yourself by wearing long sleeves, pants, a wide-brimmed hat, and sunglasses year round, whenever you are outdoors.  Once a month, do a whole-body skin exam, using a mirror to look at the skin on your back. Tell your health care provider about new moles, moles that have irregular borders, moles that are larger than a pencil eraser, or moles that have changed in shape or color.  Stay current with required vaccines (immunizations).  Influenza vaccine. All adults should be immunized every year.  Tetanus, diphtheria, and acellular pertussis (Td, Tdap) vaccine. An adult who has not previously received  Tdap or who does not know his vaccine status should receive 1 dose of Tdap. This initial dose should be followed by tetanus and diphtheria toxoids (Td) booster doses every 10 years. Adults with an unknown or incomplete history of completing a 3-dose immunization series with Td-containing vaccines should begin or complete a primary immunization series including a Tdap dose. Adults should receive a Td booster every 10 years.  Zoster vaccine. One dose is recommended for adults aged 79 years or older unless certain conditions are present.    PREVNAR - Pneumococcal 13-valent conjugate (PCV13) vaccine. When indicated, a person who is uncertain of his immunization history and has no record of immunization should receive the PCV13 vaccine. An adult aged 26 years or older who has certain medical conditions and has not been previously immunized should receive 1 dose of PCV13 vaccine. This PCV13 should be followed with a dose of pneumococcal polysaccharide (PPSV23) vaccine. The PPSV23 vaccine dose should be obtained 1 or more year(s)after the dose of PCV13 vaccine. An adult aged 77 years or older who has certain medical conditions and previously received 1 or more doses of PPSV23 vaccine should receive 1 dose of PCV13. The PCV13 vaccine dose should be obtained 1 or more years after the last PPSV23 vaccine dose.    PNEUMOVAX - Pneumococcal polysaccharide (PPSV23) vaccine. When PCV13 is also indicated, PCV13 should be obtained first. All adults aged 14 years and older should be immunized. An adult younger than age 85 years who has certain medical conditions should be immunized. Any person who resides in a nursing home or long-term care facility should be immunized. An adult smoker should be immunized. People with an immunocompromised condition and certain other conditions should receive both PCV13 and PPSV23 vaccines. People with human immunodeficiency virus (HIV) infection should be immunized as soon as possible after  diagnosis. Immunization during chemotherapy or radiation therapy should be avoided. Routine use of PPSV23 vaccine is not recommended for American Indians, [place] Natives, or people younger than 65 years unless there are medical conditions that require PPSV23 vaccine. When indicated, people who have unknown immunization and have no record of immunization should receive PPSV23 vaccine. One-time revaccination 5 years after the first dose of PPSV23 is recommended for people aged 19-64 years who have chronic kidney failure, nephrotic syndrome,  asplenia, or immunocompromised conditions. People who received 1-2 doses of PPSV23 before age 40 years should receive another dose of PPSV23 vaccine at age 97 years or later if at least 5 years have passed since the previous dose. Doses of PPSV23 are not needed for people immunized with PPSV23 at or after age 3 years.    Hepatitis A vaccine. Adults who wish to be protected from this disease, have certain high-risk conditions, work with hepatitis A-infected animals, work in hepatitis A research labs, or travel to or work in countries with a high rate of hepatitis A should be immunized. Adults who were previously unvaccinated and who anticipate close contact with an international adoptee during the first 60 days after arrival in the [place] from a country with a high rate of hepatitis A should be immunized.    Hepatitis B vaccine. Adults should be immunized if they wish to be protected from this disease, have certain high-risk conditions, may be exposed to blood or other infectious body fluids, are household contacts or sex partners of hepatitis B positive people, are clients or workers in certain care facilities, or travel to or work in countries with a high rate of hepatitis B.   Preventive Service / Frequency   Ages 106 and over  Blood pressure check.  Lipid and cholesterol check.  Lung cancer screening. / Every year if you are aged 50-80 years and have a  30-pack-year history of smoking and currently smoke or have quit within the past 15 years. Yearly screening is stopped once you have quit smoking for at least 15 years or develop a health problem that would prevent you from having lung cancer treatment.  Fecal occult blood test (FOBT) of stool. You may not have to do this test if you get a colonoscopy every 10 years.  Flexible sigmoidoscopy** or colonoscopy.** / Every 5 years for a flexible sigmoidoscopy or every 10 years for a colonoscopy beginning at age 53 and continuing until age 4.  Hepatitis C blood test.** / For all people born from [birth year] through 1965 and any individual with known risks for hepatitis C.  Abdominal aortic aneurysm (AAA) screening./ Screening current or former smokers or have Hypertension.  Skin self-exam. / Monthly.  Influenza vaccine. / Every year.  Tetanus, diphtheria, and acellular pertussis (Tdap/Td) vaccine.** / 1 dose of Td every 10 years.   Zoster vaccine.** / 1 dose for adults aged 82 years or older.         Pneumococcal 13-valent conjugate (PCV13) vaccine.    Pneumococcal polysaccharide (PPSV23) vaccine.     Hepatitis A vaccine.** / Consult your health care provider.  Hepatitis B vaccine.** / Consult your health care provider. Screening for abdominal aortic aneurysm (AAA)  by ultrasound is recommended for people who have history of high blood pressure or who are current or former smokers. ++++++++++ Recommend Adult Low Dose Aspirin or  coated  Aspirin 81 mg daily  To reduce risk of Colon Cancer 20 %,  Skin Cancer 26 % ,  Malignant Melanoma 46%  and  Pancreatic cancer 60% ++++++++++++++++++++++ Vitamin D goal  is between 70-100.  Please make sure that you are taking your Vitamin D as directed.  It is very important as a natural anti-inflammatory  helping hair, skin, and nails, as well as reducing stroke and heart attack risk.  It helps your bones and helps with mood. It also decreases  numerous cancer risks so please take it as directed.  Low Vit D is  associated with a 200-300% higher risk for CANCER  and 200-300% higher risk for [place].   .....................................Marland Kitchen It is also associated with higher death rate at younger ages,  autoimmune diseases like Rheumatoid arthritis, Lupus, Multiple Sclerosis.    Also many other serious conditions, like depression, Alzheimer's Dementia, infertility, muscle aches, fatigue, fibromyalgia - just to name a few. ++++++++++++++++++++++ Recommend the book "The END of DIETING" by Dr Excell Seltzer  & the book "The END of DIABETES " by Dr Excell Seltzer At [email or web address] - get book & Audio CD's    Being diabetic has a  300% increased risk for heart attack, stroke, cancer, and alzheimer- type vascular dementia. It is very important that you work harder with diet by avoiding all foods that are white. Avoid white rice (brown & wild rice is OK), white potatoes (sweetpotatoes in moderation is OK), White bread or wheat bread or anything made out of white flour like bagels, donuts, rolls, buns, biscuits, cakes, pastries, cookies, pizza crust, and pasta (made from white flour & egg whites) - vegetarian pasta or spinach or wheat pasta is OK. Multigrain breads like Arnold's or Pepper[employer], or multigrain sandwich thins or flatbreads.  Diet, exercise and weight loss can reverse and cure diabetes in the early stages.  Diet, exercise and weight loss is very important in the control and prevention of complications of diabetes which affects every system in your body, ie. Brain - dementia/stroke, eyes - glaucoma/blindness, heart - heart attack/heart failure, kidneys - dialysis, stomach - gastric paralysis, intestines - malabsorption, nerves - severe painful neuritis, circulation - gangrene & loss of a leg(s), and finally cancer and Alzheimers.    I recommend avoid fried & greasy foods,  sweets/candy, white rice (brown or wild rice or Quinoa is  OK), white potatoes (sweet potatoes are OK) - anything made from white flour - bagels, doughnuts, rolls, buns, biscuits,white and wheat breads, pizza crust and traditional pasta made of white flour & egg white(vegetarian pasta or spinach or wheat pasta is OK).  Multi-grain bread is OK - like multi-grain flat bread or sandwich thins. Avoid alcohol in excess. Exercise is also important.    Eat all the vegetables you want - avoid meat, especially red meat and dairy - especially cheese.  Cheese is the most concentrated form of trans-fats which is the worst thing to clog up our arteries. Veggie cheese is OK which can be found in the fresh produce section at [employer] or [employer] or [employer]  ++++++++++++++++++++++ DASH Eating Plan  DASH stands for "Dietary Approaches to Stop Hypertension."   The DASH eating plan is a healthy eating plan that has been shown to reduce high blood pressure (hypertension). Additional health benefits may include reducing the risk of type 2 diabetes mellitus, heart disease, and stroke. The DASH eating plan may also help with weight loss. WHAT DO I NEED TO KNOW ABOUT THE DASH EATING PLAN? For the DASH eating plan, you will follow these general guidelines:  Choose foods with a percent daily value for sodium of less than 5% (as listed on the food label).  Use salt-free seasonings or herbs instead of table salt or sea salt.  Check with your health care provider or pharmacist before using salt substitutes.  Eat lower-sodium products, often labeled as "lower sodium" or "no salt added."  Eat fresh foods.  Eat more vegetables, fruits, and low-fat dairy products.  Choose whole grains. Look for the word "whole" as  the first word in the ingredient list.  Choose fish   Limit sweets, desserts, sugars, and sugary drinks.  Choose heart-healthy fats.  Eat veggie cheese   Eat more home-cooked food and less restaurant, buffet, and fast food.  Limit fried  foods.  Cook foods using methods other than frying.  Limit canned vegetables. If you do use them, rinse them well to decrease the sodium.  When eating at a restaurant, ask that your food be prepared with less salt, or no salt if possible.                      WHAT FOODS CAN I EAT? Read Dr Fara Olden Fuhrman's books on The End of Dieting & The End of Diabetes  Grains Whole grain or whole wheat bread. Brown rice. Whole grain or whole wheat pasta. Quinoa, bulgur, and whole grain cereals. Low-sodium cereals. Corn or whole wheat flour tortillas. Whole grain cornbread. Whole grain crackers. Low-sodium crackers.  Vegetables Fresh or frozen vegetables (raw, steamed, roasted, or grilled). Low-sodium or reduced-sodium tomato and vegetable juices. Low-sodium or reduced-sodium tomato sauce and paste. Low-sodium or reduced-sodium canned vegetables.   Fruits All fresh, canned (in natural juice), or frozen fruits.  Protein Products  All fish and seafood.  Dried beans, peas, or lentils. Unsalted nuts and seeds. Unsalted canned beans.  Dairy Low-fat dairy products, such as skim or 1% milk, 2% or reduced-fat cheeses, low-fat ricotta or cottage cheese, or plain low-fat yogurt. Low-sodium or reduced-sodium cheeses.  Fats and Oils Tub margarines without trans fats. Light or reduced-fat mayonnaise and salad dressings (reduced sodium). Avocado. Safflower, olive, or canola oils. Natural peanut or almond butter.  Other Unsalted popcorn and pretzels. The items listed above may not be a complete list of recommended foods or beverages. Contact your dietitian for more options.  ++++++++++++++++++++  WHAT FOODS ARE NOT RECOMMENDED? Grains/ White flour or wheat flour White bread. White pasta. White rice. Refined cornbread. Bagels and croissants. Crackers that contain trans fat.  Vegetables  Creamed or fried vegetables. Vegetables in a . Regular canned vegetables. Regular canned tomato sauce and paste. Regular  tomato and vegetable juices.  Fruits Dried fruits. Canned fruit in light or heavy syrup. Fruit juice.  Meat and Other Protein Products Meat in general - RED meat & White meat.  Fatty cuts of meat. Ribs, chicken wings, all processed meats as bacon, sausage, bologna, salami, fatback, hot dogs, bratwurst and packaged luncheon meats.  Dairy Whole or 2% milk, cream, half-and-half, and cream cheese. Whole-fat or sweetened yogurt. Full-fat cheeses or blue cheese. Non-dairy creamers and whipped toppings. Processed cheese, cheese spreads, or cheese curds.  Condiments Onion and garlic salt, seasoned salt, table salt, and sea salt. Canned and packaged gravies. Worcestershire sauce. Tartar sauce. Barbecue sauce. Teriyaki sauce. Soy sauce, including reduced sodium. Steak sauce. Fish sauce. Oyster sauce. Cocktail sauce. Horseradish. Ketchup and mustard. Meat flavorings and tenderizers. Bouillon cubes. Hot sauce. Tabasco sauce. Marinades. Taco seasonings. Relishes.  Fats and Oils Butter, stick margarine, lard, shortening and bacon fat. Coconut, palm kernel, or palm oils. Regular salad dressings.  Pickles and olives. Salted popcorn and pretzels.  The items listed above may not be a complete list of foods and beverages to avoid.

## 2018-11-19 NOTE — Progress Notes (Signed)
MEDICARE ANNUAL WELLNESS VISIT AND FOLLOW UP Assessment:   Brett [name] was seen today for follow-up and medicare wellness.  Diagnoses and all orders for this visit:  Encounter for Medicare annual wellness exam Yearly  Essential hypertension - continue medications, DASH diet, exercise and monitor at home. Call if greater than 130/80.   -     CBC with Differential/Platelet -     COMPLETE METABOLIC PANEL WITH GFR -     Magnesium  Hyperlipidemia, mixed -     Lipid panel  Type 2 diabetes mellitus with stage 2 chronic kidney disease, with long-term current use of insulin (HCC) -     Hemoglobin A1c -     Insulin, random  Vitamin D deficiency -     VITAMIN D 25 Hydroxy (Vit-D Deficiency, Fractures)  Hereditary hemochromatosis ([place]) Follow up in one month for ferritin Q60month schedule at this time. -     CBC with Differential/Platelet  Gastroesophageal reflux disease, esophagitis presence not specified Doing well on current regiment, continue with benefit -     Magnesium  CKD stage 2 due to type 2 diabetes mellitus (HCC) Increase fluids,  Avoid NSAIDS,  Monitor sugars, will continue to monitor  Type 2 diabetes mellitus with hemoglobin A1c goal of less than 8.0% (HCC) Continue medications: monitor glucose AC & HS Continue diet and exercise.  Perform daily foot/skin check, notify office of any concerning changes.  Check A1C  OSA (obstructive sleep apnea) Sleep apnea- continue CPAP,  Wearing 100% of time and doing well Denies any issues, continue with benefit weight loss still advised.   Medication management -     CBC with Differential/Platelet -     COMPLETE METABOLIC PANEL WITH GFR -     Magnesium -     Lipid panel -     TSH -     Hemoglobin A1c -     Insulin, random -     VITAMIN D 25 Hydroxy (Vit-D Deficiency, Fractures)    Over 30 minutes of exam, counseling, chart review, and critical decision making was performed  Future Appointments  Date Time Provider  [place]  12/07/2018  7:45 AM [facility] None  02/04/2019  7:30 AM [name], MD [facility] None  03/07/2019  9:00 AM [name], MD [facility] None  04/08/2019  7:45 AM [facility] None  08/09/2019  8:00 AM [facility] None  08/09/2019  8:30 AM [name], MD [facility] None     Plan:   During the course of the visit the patient was educated and counseled about appropriate screening and preventive services including:    Pneumococcal vaccine   Influenza vaccine  Prevnar 13  Td vaccine  Screening electrocardiogram  Colorectal cancer screening  Diabetes screening  Glaucoma screening  Nutrition counseling    Subjective:  Brett [name] is a 73 y.o. male who presents for Medicare Annual Wellness Visit and 3 month follow up for HTN, HLD, T2_IDDM and Vitamin D Deficiency. Patient has hx/o Gout controlled on his allopurinol. He is followed by Dr Benay Spice for Hereditary Hemochromatosis with periodic phlebotomies and due for ferritin recheck in one month and continuing at four month intervals.   Patients HTN predates 1970 His blood pressure has been controlled at home, today their BP is BP: (!) 158/82  He does not workout. He denies chest pain, shortness of breath, dizziness.  He is on cholesterol medication and denies myalgias. His cholesterol is at goal.  The cholesterol last visit was:   Lab Results  Component Value Date   CHOL 114 11/20/2018   HDL 39 (L) 11/20/2018   LDLCALC 59 11/20/2018   TRIG 79 11/20/2018   CHOLHDL 2.9 11/20/2018   He has not been working on diet and exercise for DM II (2006), and denies hyperglycemia, hypoglycemia , increased appetite, nausea, polydipsia, polyuria and visual disturbances. Last A1C in the office was:  Lab Results  Component Value Date   HGBA1C 7.2 (H) 11/20/2018   Last GFR Lab Results  Component Value Date   GFRNONAA 65 11/20/2018     Lab Results   Component Value Date   GFRAA 75 11/20/2018   Patient is on Vitamin D supplement.   Lab Results  Component Value Date   VD25OH 62 11/20/2018      Medication Review:  Current Outpatient Medications (Endocrine & Metabolic):  .  insulin NPH-regular Human (NOVOLIN 70/30) (70-30) 100 UNIT/ML injection, Inject 25 units in the morning, 10 units at lunch if elevated, 10-15 units in the evening. .  metFORMIN (GLUCOPHAGE-XR) 500 MG 24 hr tablet, TAKE 2 TABLETS BY MOUTH TWO TIMES DAILY .  glipiZIDE (GLUCOTROL) 10 MG tablet, TAKE 1 TABLET BY MOUTH 3  TIMES A DAY WITH MEALS  Current Outpatient Medications (Cardiovascular):  .  atorvastatin (LIPITOR) 20 MG tablet, TAKE 1 TABLET BY MOUTH  DAILY .  bisoprolol-hydrochlorothiazide (ZIAC) 10-6.25 MG tablet, TAKE 1 TABLET BY MOUTH  DAILY .  furosemide (LASIX) 40 MG tablet, TAKE 1 TABLET BY MOUTH  DAILY .  losartan (COZAAR) 100 MG tablet, TAKE 1 TABLET BY MOUTH  DAILY .  minoxidil (LONITEN) 10 MG tablet, TAKE 1 TABLET BY MOUTH  DAILY  Current Outpatient Medications (Respiratory):  Marland Kitchen  Loratadine (CLARITIN PO), Take by mouth daily.   .  montelukast (SINGULAIR) 10 MG tablet, TAKE 1 TABLET BY MOUTH  EVERY NIGHT AT BEDTIME  Current Outpatient Medications (Analgesics):  .  allopurinol (ZYLOPRIM) 300 MG tablet, TAKE 1 TABLET BY MOUTH  DAILY .  aspirin 81 MG tablet, Take 81 mg by mouth daily.     Current Outpatient Medications (Other):  Marland Kitchen  BESIVANCE 0.6 % SUSP,  .  Cholecalciferol (VITAMIN D PO), Take 5,000 Units by mouth daily. .  cyclopentolate (CYCLODRYL,CYCLOGYL) 1 % ophthalmic solution,  .  diphenhydramine-acetaminophen (TYLENOL PM) 25-500 MG TABS tablet, Take 1 tablet by mouth at bedtime as needed.  Marland Kitchen  glucose blood test strip, Use as instructed .  Insulin Pen Needle 31G X 8 MM MISC, Use one daily  .  Insulin Syringe-Needle U-100 31G X 15/64" 0.5 ML MISC, Use two pens daily with insulin .  magnesium oxide (MAG-OX) 400 MG tablet, Take 400 mg by  mouth 2 (two) times daily.  Vladimir Faster Glycol-Propyl Glycol (SYSTANE OP), Apply to eye as needed. .  ranitidine (ZANTAC) 300 MG tablet, TAKE 1 TABLET BY MOUTH AT  BEDTIME  Allergies: No Known Allergies  Current Problems (verified) has Hemochromatosis; CKD stage 2 due to type 2 diabetes mellitus ([place]); Hypertension; OSA (obstructive sleep apnea); Gout; Dilated cardiomyopathy ([place]); Hyperlipidemia, mixed; Vitamin D deficiency; Medication management; Obesity (BMI 30.0-34.9); Gluttony; Type 2 diabetes mellitus with stage 2 chronic kidney disease, with long-term current use of insulin ([place]); Diabetic retinopathy ([place]); Former smoker; FHx: heart disease; and Gastroesophageal reflux disease on their problem list.  Screening Tests Immunization History  Administered Date(s) Administered  . DT 01/21/2014  . Influenza Split 09/11/2013  . Influenza, High Dose Seasonal PF  07/27/2017  . Influenza,inj,Quad PF,6+ Mos 07/28/2014, 08/06/2018  . Influenza-Unspecified 08/21/2012  . Pneumococcal Conjugate-13 09/11/2014  . Pneumococcal-Unspecified 08/21/2012    Preventative care: Last colonoscopy: 2016  Chest X-ray: 2015 EKG: 01/2018 ECHO 12/2009  Prior vaccinations: TD or Tdap: 2015  Influenza: 2019 Pneumococcal: 2013 Prevnar13: 2-15 Shingrix: Discussed with patient, reports insurance does not cover.  Names of Other Physician/Practitioners you currently use: 1. [facility] here for primary care 2. Diabetic Eye Exam 12/2017 3. Dental Exam 08/2018 Patient Care Team: [name], MD as PCP - General (Internal Medicine) [name], MD as Consulting Physician (Oncology) Stanford Breed Denice Bors, MD as Consulting Physician (Cardiology) [name], MD as Consulting Physician (Ophthalmology)  Surgical: He  has a past surgical history that includes Eye surgery (Bilateral). Family His family history includes Cancer in his maternal uncle; Diabetes in  his brother, father, and another family member; Heart attack in his brother; Heart disease in an other family member; Hyperlipidemia in an other family member; Hypertension in an other family member. Social history  He reports that he quit smoking about 42 years ago. His smoking use included cigarettes. He started smoking about 61 years ago. He has a 57.00 pack-year smoking history. He has never used smokeless tobacco. He reports current alcohol use. He reports that he does not use drugs.  MEDICARE WELLNESS OBJECTIVES: Physical activity:   Cardiac risk factors:   Depression/mood screen:   Depression screen [facility] 2/9 08/15/2018  Decreased Interest 0  Down, Depressed, Hopeless 0  PHQ - 2 Score 0    ADLs:  In your present state of health, do you have any difficulty performing the following activities: 08/15/2018 02/10/2018  Hearing? N -  Vision? N N  Difficulty concentrating or making decisions? N N  Walking or climbing stairs? N N  Dressing or bathing? N N  Doing errands, shopping? N N  Some recent data might be hidden     Cognitive Testing  Alert? Yes  Normal Appearance?Yes  Oriented to person? Yes  Place? Yes   Time? Yes  Recall of three objects?  Yes  Can perform simple calculations? Yes  Displays appropriate judgment?Yes  Can read the correct time from a watch face?Yes  EOL planning: Does Patient Have a Medical Advance Directive?: Yes Type of Advance Directive: Living will Does patient want to make changes to medical advance directive?: No - Patient declined   Objective:   Today's Vitals   11/20/18 1345  BP: (!) 158/82  Pulse: 61  Temp: 97.9 F (36.6 C)  SpO2: 95%  Weight: 217 lb (98.4 kg)  Height: 5\' 5"  (1.651 m)   Body mass index is 36.11 kg/m.  General appearance: alert, no distress, WD/WN, male HEENT: normocephalic, sclerae anicteric, TMs pearly, nares patent, no discharge or erythema, pharynx normal Oral cavity: MMM, no lesions Neck: supple, no  lymphadenopathy, no thyromegaly, no masses Heart: RRR, normal S1, S2, no murmurs Lungs: CTA bilaterally, no wheezes, rhonchi, or rales Abdomen: +bs, soft, non tender, non distended, no masses, no hepatomegaly, no splenomegaly Musculoskeletal: nontender, no swelling, no obvious deformity Extremities: no edema, no cyanosis, no clubbing Pulses: 2+ symmetric, upper and lower extremities, normal cap refill Neurological: alert, oriented x 3, CN2-12 intact, strength normal upper extremities and lower extremities, sensation normal throughout, DTRs 2+ throughout, no cerebellar signs, gait normal Psychiatric: normal affect, behavior normal, pleasant   Medicare Attestation I have personally reviewed: The patient's medical and social history Their use of alcohol,  tobacco or illicit drugs Their current medications and supplements The patient's functional ability including ADLs,fall risks, home safety risks, cognitive, and hearing and visual impairment Diet and physical activities Evidence for depression or mood disorders  The patient's weight, height, BMI, and visual acuity have been recorded in the chart.  I have made referrals, counseling, and provided education to the patient based on review of the above and I have provided the patient with a written personalized care plan for preventive services.     Garnet Sierras, NP   11/23/2018  [facility]

## 2018-11-20 ENCOUNTER — Encounter: Payer: Self-pay | Admitting: Adult Health Nurse Practitioner

## 2018-11-20 ENCOUNTER — Ambulatory Visit (INDEPENDENT_AMBULATORY_CARE_PROVIDER_SITE_OTHER): Payer: Medicare Other | Admitting: Adult Health Nurse Practitioner

## 2018-11-20 ENCOUNTER — Ambulatory Visit: Payer: Self-pay | Admitting: Adult Health

## 2018-11-20 ENCOUNTER — Ambulatory Visit: Payer: Self-pay | Admitting: Adult Health Nurse Practitioner

## 2018-11-20 VITALS — BP 158/82 | HR 61 | Temp 97.9°F | Ht 65.0 in | Wt 217.0 lb

## 2018-11-20 DIAGNOSIS — Z0001 Encounter for general adult medical examination with abnormal findings: Secondary | ICD-10-CM | POA: Diagnosis not present

## 2018-11-20 DIAGNOSIS — I10 Essential (primary) hypertension: Secondary | ICD-10-CM | POA: Diagnosis not present

## 2018-11-20 DIAGNOSIS — N182 Chronic kidney disease, stage 2 (mild): Secondary | ICD-10-CM

## 2018-11-20 DIAGNOSIS — Z794 Long term (current) use of insulin: Secondary | ICD-10-CM | POA: Diagnosis not present

## 2018-11-20 DIAGNOSIS — G4733 Obstructive sleep apnea (adult) (pediatric): Secondary | ICD-10-CM | POA: Diagnosis not present

## 2018-11-20 DIAGNOSIS — E782 Mixed hyperlipidemia: Secondary | ICD-10-CM

## 2018-11-20 DIAGNOSIS — E1122 Type 2 diabetes mellitus with diabetic chronic kidney disease: Secondary | ICD-10-CM

## 2018-11-20 DIAGNOSIS — E119 Type 2 diabetes mellitus without complications: Secondary | ICD-10-CM

## 2018-11-20 DIAGNOSIS — E559 Vitamin D deficiency, unspecified: Secondary | ICD-10-CM | POA: Diagnosis not present

## 2018-11-20 DIAGNOSIS — K219 Gastro-esophageal reflux disease without esophagitis: Secondary | ICD-10-CM | POA: Diagnosis not present

## 2018-11-20 DIAGNOSIS — R6889 Other general symptoms and signs: Secondary | ICD-10-CM | POA: Diagnosis not present

## 2018-11-20 DIAGNOSIS — Z0000 Encounter for general adult medical examination without abnormal findings: Secondary | ICD-10-CM

## 2018-11-20 DIAGNOSIS — Z79899 Other long term (current) drug therapy: Secondary | ICD-10-CM

## 2018-11-21 ENCOUNTER — Other Ambulatory Visit: Payer: Self-pay | Admitting: Internal Medicine

## 2018-11-21 LAB — MAGNESIUM: Magnesium: 2 mg/dL (ref 1.5–2.5)

## 2018-11-21 LAB — CBC WITH DIFFERENTIAL/PLATELET
Absolute Monocytes: 618 {cells}/uL (ref 200–950)
Basophils Absolute: 43 {cells}/uL (ref 0–200)
Basophils Relative: 0.6 %
Eosinophils Absolute: 213 cells/uL (ref 15–500)
Eosinophils Relative: 3 %
HCT: 42.3 % (ref 38.5–50.0)
Hemoglobin: 15.4 g/dL (ref 13.2–17.1)
Lymphs Abs: 1654 cells/uL (ref 850–3900)
MCH: 33.5 pg — ABNORMAL HIGH (ref 27.0–33.0)
MCHC: 36.4 g/dL — ABNORMAL HIGH (ref 32.0–36.0)
MCV: 92 fL (ref 80.0–100.0)
MPV: 9.4 fL (ref 7.5–12.5)
Monocytes Relative: 8.7 %
Neutro Abs: 4572 cells/uL (ref 1500–7800)
Neutrophils Relative %: 64.4 %
Platelets: 214 10*3/uL (ref 140–400)
RBC: 4.6 10*6/uL (ref 4.20–5.80)
RDW: 12.9 % (ref 11.0–15.0)
Total Lymphocyte: 23.3 %
WBC: 7.1 10*3/uL (ref 3.8–10.8)

## 2018-11-21 LAB — COMPLETE METABOLIC PANEL WITH GFR
AG Ratio: 2.3 (calc) (ref 1.0–2.5)
ALT: 26 U/L (ref 9–46)
Albumin: 4.5 g/dL (ref 3.6–5.1)
CO2: 26 mmol/L (ref 20–32)
Calcium: 9.4 mg/dL (ref 8.6–10.3)
Chloride: 104 mmol/L (ref 98–110)
Creat: 1.13 mg/dL (ref 0.70–1.18)
GFR, Est African American: 75 mL/min/{1.73_m2} (ref >=60)
GFR, Est Non African American: 65 mL/min/{1.73_m2} (ref >=60)
Globulin: 2 g/dL (calc) (ref 1.9–3.7)
Potassium: 3.8 mmol/L (ref 3.5–5.3)
Sodium: 140 mmol/L (ref 135–146)
Total Bilirubin: 1 mg/dL (ref 0.2–1.2)
Total Protein: 6.5 g/dL (ref 6.1–8.1)

## 2018-11-21 LAB — COMPLETE METABOLIC PANEL WITHOUT GFR
AST: 20 U/L (ref 10–35)
Alkaline phosphatase (APISO): 34 U/L — ABNORMAL LOW (ref 40–115)
BUN: 21 mg/dL (ref 7–25)
Glucose, Bld: 119 mg/dL — ABNORMAL HIGH (ref 65–99)

## 2018-11-21 LAB — LIPID PANEL
Cholesterol: 114 mg/dL (ref <200)
HDL: 39 mg/dL — ABNORMAL LOW (ref >40)
LDL Cholesterol (Calc): 59 mg/dL
Non-HDL Cholesterol (Calc): 75 mg/dL (calc) (ref <130)
Total CHOL/HDL Ratio: 2.9 (calc) (ref <5.0)
Triglycerides: 79 mg/dL (ref <150)

## 2018-11-21 LAB — INSULIN, RANDOM: Insulin: 25 u[IU]/mL — ABNORMAL HIGH (ref 2.0–19.6)

## 2018-11-21 LAB — HEMOGLOBIN A1C
Hgb A1c MFr Bld: 7.2 % of total Hgb — ABNORMAL HIGH (ref <5.7)
Mean Plasma Glucose: 160 (calc)
eAG (mmol/L): 8.9 (calc)

## 2018-11-21 LAB — TSH: TSH: 2.66 m[IU]/L (ref 0.40–4.50)

## 2018-11-21 LAB — VITAMIN D 25 HYDROXY (VIT D DEFICIENCY, FRACTURES): Vit D, 25-Hydroxy: 62 ng/mL (ref 30–100)

## 2018-11-23 ENCOUNTER — Encounter: Payer: Self-pay | Admitting: Adult Health Nurse Practitioner

## 2018-11-23 DIAGNOSIS — K219 Gastro-esophageal reflux disease without esophagitis: Secondary | ICD-10-CM | POA: Insufficient documentation

## 2018-11-27 ENCOUNTER — Telehealth: Payer: Self-pay | Admitting: Internal Medicine

## 2018-11-27 NOTE — Telephone Encounter (Signed)
Patient and daughter, Brett [name] called to request supplies for [employer]'. Patient got monitor and supplies  from [place] [phone] on Truman 21, 2020. Daughter will call medical supply and advise them to fax renewal request for dm supplies to our office.

## 2018-12-07 ENCOUNTER — Inpatient Hospital Stay: Payer: Medicare Other | Attending: Oncology

## 2018-12-07 LAB — FERRITIN: Ferritin: 76 ng/mL (ref 24–336)

## 2018-12-10 ENCOUNTER — Other Ambulatory Visit: Payer: Self-pay | Admitting: Physician Assistant

## 2018-12-10 ENCOUNTER — Telehealth: Payer: Self-pay

## 2018-12-10 DIAGNOSIS — I10 Essential (primary) hypertension: Secondary | ICD-10-CM

## 2018-12-10 NOTE — Telephone Encounter (Signed)
TC to pt per Lattie Haw to let him know that his ferritin is stable, and to  f/u as scheduled. Pt verbalized understanding. No further problems or concerns at this time.

## 2019-02-04 ENCOUNTER — Encounter (INDEPENDENT_AMBULATORY_CARE_PROVIDER_SITE_OTHER): Payer: Medicare Other | Admitting: Ophthalmology

## 2019-03-06 ENCOUNTER — Encounter: Payer: Self-pay | Admitting: Internal Medicine

## 2019-03-06 NOTE — Patient Instructions (Signed)
Coronavirus (COVID-19) Are you at risk?  Are you at risk for the Coronavirus (COVID-19)?  To be considered HIGH RISK for Coronavirus (COVID-19), you have to meet the following criteria:  . Traveled to [place], [place], [place], [place] or [place]; or in the [place] to [place], [place], [place]  . or [place]; and have fever, cough, and shortness of breath within the last 2 weeks of travel OR . Been in close contact with a person diagnosed with COVID-19 within the last 2 weeks and have  . fever, cough,and shortness of breath .  . IF YOU DO NOT MEET THESE CRITERIA, YOU ARE CONSIDERED LOW RISK FOR COVID-19.  What to do if you are HIGH RISK for COVID-19?  . If you are having a medical emergency, call 911. . Seek medical care right away. Before you go to a doctor's office, urgent care or emergency department, .  call ahead and tell them about your recent travel, contact with someone diagnosed with COVID-19  .  and your symptoms.  . You should receive instructions from your physician's office regarding next steps of care.  . When you arrive at healthcare provider, tell the healthcare staff immediately you have returned from  . visiting [place], [place], [place], [place] or [place]; or traveled in the [place] to [place], [place],  . [place] or [place] in the last two weeks or you have been in close contact with a person diagnosed with  . COVID-19 in the last 2 weeks.   . Tell the health care staff about your symptoms: fever, cough and shortness of breath. . After you have been seen by a medical provider, you will be either: o Tested for (COVID-19) and discharged home on quarantine except to seek medical care if  o symptoms worsen, and asked to  - Stay home and avoid contact with others until you get your results (4-5 days)  - Avoid travel on public transportation if possible (such as bus, train, or airplane) or o Sent to the Emergency Department by EMS for evaluation,  COVID-19 testing  and  o possible admission depending on your condition and test results.  What to do if you are LOW RISK for COVID-19?  Reduce your risk of any infection by using the same precautions used for avoiding the common cold or flu:  . Wash your hands often with soap and warm water for at least 20 seconds.  If soap and water are not readily available,  . use an alcohol-based hand sanitizer with at least 60% alcohol.  . If coughing or sneezing, cover your mouth and nose by coughing or sneezing into the elbow areas of your shirt or coat, .  into a tissue or into your sleeve (not your hands). . Avoid shaking hands with others and consider head nods or verbal greetings only. . Avoid touching your eyes, nose, or mouth with unwashed hands.  . Avoid close contact with people who are sick. . Avoid places or events with large numbers of people in one location, like concerts or sporting events. . Carefully consider travel plans you have or are making. . If you are planning any travel outside or inside the [place], visit the CDC's Travelers' Health webpage for the latest health notices. . If you have some symptoms but not all symptoms, continue to monitor at home and seek medical attention  . if your symptoms worsen. . If you are having a medical emergency, call 911. >>>>>>>>>>>>>>>>>>>>>>>>>>>>>>>>>>>>>>>>>>>>>>>>>>>>>>>   We Do NOT Approve of  [facility] [place] Soliciting Our Patients  To Do Home Visits  & We Do NOT Approve of [employer] > > > > > > > > > > > > > > > > > > > > > > > > > > > > > > > > > > >  > > > >   Preventive Care for Adults  A healthy lifestyle and preventive care can promote health and wellness. Preventive health guidelines for men include the following key practices:  A routine yearly physical is a good way to check with your health care provider about your health and preventative screening. It is a chance to share any concerns and updates on  your health and to receive a thorough exam.  Visit your dentist for a routine exam and preventative care every 6 months. Brush your teeth twice a day and floss once a day. Good oral hygiene prevents tooth decay and gum disease.  The frequency of eye exams is based on your age, health, family medical history, use of contact lenses, and other factors. Follow your health care provider's recommendations for frequency of eye exams.  Eat a healthy diet. Foods such as vegetables, fruits, whole grains, low-fat dairy products, and lean protein foods contain the nutrients you need without too many calories. Decrease your intake of foods high in solid fats, added sugars, and salt. Eat the right amount of calories for you. Get information about a proper diet from your health care provider, if necessary.  Regular physical exercise is one of the most important things you can do for your health. Most adults should get at least 150 minutes of moderate-intensity exercise (any activity that increases your heart rate and causes you to sweat) each week. In addition, most adults need muscle-strengthening exercises on 2 or more days a week.  Maintain a healthy weight. The body mass index (BMI) is a screening tool to identify possible weight problems. It provides an estimate of body fat based on height and weight. Your health care provider can find your BMI and can help you achieve or maintain a healthy weight. For adults 20 years and older:  A BMI below 18.5 is considered underweight.  A BMI of 18.5 to 24.9 is normal.  A BMI of 25 to 29.9 is considered overweight.  A BMI of 30 and above is considered obese.  Maintain normal blood lipids and cholesterol levels by exercising and minimizing your intake of saturated fat. Eat a balanced diet with plenty of fruit and vegetables. Blood tests for lipids and cholesterol should begin at age 20 and be repeated every 5 years. If your lipid or cholesterol levels are high, you are  over 50, or you are at high risk for heart disease, you may need your cholesterol levels checked more frequently. Ongoing high lipid and cholesterol levels should be treated with medicines if diet and exercise are not working.  If you smoke, find out from your health care provider how to quit. If you do not use tobacco, do not start.  Lung cancer screening is recommended for adults aged 55-80 years who are at high risk for developing lung cancer because of a history of smoking. A yearly low-dose CT scan of the lungs is recommended for people who have at least a 30-pack-year history of smoking and are a current smoker or have quit within the past 15 years. A pack year of smoking is smoking an average of 1   pack of cigarettes a day for 1 year (for example: 1 pack a day for 30 years or 2 packs a day for 15 years). Yearly screening should continue until the smoker has stopped smoking for at least 15 years. Yearly screening should be stopped for people who develop a health problem that would prevent them from having lung cancer treatment.  If you choose to drink alcohol, do not have more than 2 drinks per day. One drink is considered to be 12 ounces (355 mL) of beer, 5 ounces (148 mL) of wine, or 1.5 ounces (44 mL) of liquor.  Avoid use of street drugs. Do not share needles with anyone. Ask for help if you need support or instructions about stopping the use of drugs.  High blood pressure causes heart disease and increases the risk of stroke. Your blood pressure should be checked at least every 1-2 years. Ongoing high blood pressure should be treated with medicines, if weight loss and exercise are not effective.  If you are 45-79 years old, ask your health care provider if you should take aspirin to prevent heart disease.  Diabetes screening involves taking a blood sample to check your fasting blood sugar level. Testing should be considered at a younger age or be carried out more frequently if you are  overweight and have at least 1 risk factor for diabetes.  Colorectal cancer can be detected and often prevented. Most routine colorectal cancer screening begins at the age of 50 and continues through age 75. However, your health care provider may recommend screening at an earlier age if you have risk factors for colon cancer. On a yearly basis, your health care provider may provide home test kits to check for hidden blood in the stool. Use of a small camera at the end of a tube to directly examine the colon (sigmoidoscopy or colonoscopy) can detect the earliest forms of colorectal cancer. Talk to your health care provider about this at age 50, when routine screening begins. Direct exam of the colon should be repeated every 5-10 years through age 75, unless early forms of precancerous polyps or small growths are found.  Hepatitis C blood testing is recommended for all people born from 1945 through 1965 and any individual with known risks for hepatitis C.  Screening for abdominal aortic aneurysm (AAA)  by ultrasound is recommended for people who have history of high blood pressure or who are current or former smokers.  Healthy men should  receive prostate-specific antigen (PSA) blood tests as part of routine cancer screening. Talk with your health care provider about prostate cancer screening.  Testicular cancer screening is  recommended for adult males. Screening includes self-exam, a health care provider exam, and other screening tests. Consult with your health care provider about any symptoms you have or any concerns you have about testicular cancer.  Use sunscreen. Apply sunscreen liberally and repeatedly throughout the day. You should seek shade when your shadow is shorter than you. Protect yourself by wearing long sleeves, pants, a wide-brimmed hat, and sunglasses year round, whenever you are outdoors.  Once a month, do a whole-body skin exam, using a mirror to look at the skin on your back. Tell  your health care provider about new moles, moles that have irregular borders, moles that are larger than a pencil eraser, or moles that have changed in shape or color.  Stay current with required vaccines (immunizations).  Influenza vaccine. All adults should be immunized every year.  Tetanus, diphtheria, and acellular   pertussis (Td, Tdap) vaccine. An adult who has not previously received Tdap or who does not know his vaccine status should receive 1 dose of Tdap. This initial dose should be followed by tetanus and diphtheria toxoids (Td) booster doses every 10 years. Adults with an unknown or incomplete history of completing a 3-dose immunization series with Td-containing vaccines should begin or complete a primary immunization series including a Tdap dose. Adults should receive a Td booster every 10 years.  Zoster vaccine. One dose is recommended for adults aged 60 years or older unless certain conditions are present.    PREVNAR - Pneumococcal 13-valent conjugate (PCV13) vaccine. When indicated, a person who is uncertain of his immunization history and has no record of immunization should receive the PCV13 vaccine. An adult aged 19 years or older who has certain medical conditions and has not been previously immunized should receive 1 dose of PCV13 vaccine. This PCV13 should be followed with a dose of pneumococcal polysaccharide (PPSV23) vaccine. The PPSV23 vaccine dose should be obtained 1 or more year(s)after the dose of PCV13 vaccine. An adult aged 19 years or older who has certain medical conditions and previously received 1 or more doses of PPSV23 vaccine should receive 1 dose of PCV13. The PCV13 vaccine dose should be obtained 1 or more years after the last PPSV23 vaccine dose.    PNEUMOVAX - Pneumococcal polysaccharide (PPSV23) vaccine. When PCV13 is also indicated, PCV13 should be obtained first. All adults aged 65 years and older should be immunized. An adult younger than age 65 years who  has certain medical conditions should be immunized. Any person who resides in a nursing home or long-term care facility should be immunized. An adult smoker should be immunized. People with an immunocompromised condition and certain other conditions should receive both PCV13 and PPSV23 vaccines. People with human immunodeficiency virus (HIV) infection should be immunized as soon as possible after diagnosis. Immunization during chemotherapy or radiation therapy should be avoided. Routine use of PPSV23 vaccine is not recommended for American Indians, [place] Natives, or people younger than 65 years unless there are medical conditions that require PPSV23 vaccine. When indicated, people who have unknown immunization and have no record of immunization should receive PPSV23 vaccine. One-time revaccination 5 years after the first dose of PPSV23 is recommended for people aged 19-64 years who have chronic kidney failure, nephrotic syndrome, asplenia, or immunocompromised conditions. People who received 1-2 doses of PPSV23 before age 65 years should receive another dose of PPSV23 vaccine at age 65 years or later if at least 5 years have passed since the previous dose. Doses of PPSV23 are not needed for people immunized with PPSV23 at or after age 65 years.    Hepatitis A vaccine. Adults who wish to be protected from this disease, have certain high-risk conditions, work with hepatitis A-infected animals, work in hepatitis A research labs, or travel to or work in countries with a high rate of hepatitis A should be immunized. Adults who were previously unvaccinated and who anticipate close contact with an international adoptee during the first 60 days after arrival in the [place] from a country with a high rate of hepatitis A should be immunized.    Hepatitis B vaccine. Adults should be immunized if they wish to be protected from this disease, have certain high-risk conditions, may be exposed to blood or other  infectious body fluids, are household contacts or sex partners of hepatitis B positive people, are clients or workers in certain care facilities,   or travel to or work in countries with a high rate of hepatitis B.   Preventive Service / Frequency   Ages 65 and over  Blood pressure check.  Lipid and cholesterol check.  Lung cancer screening. / Every year if you are aged 55-80 years and have a 30-pack-year history of smoking and currently smoke or have quit within the past 15 years. Yearly screening is stopped once you have quit smoking for at least 15 years or develop a health problem that would prevent you from having lung cancer treatment.  Fecal occult blood test (FOBT) of stool. You may not have to do this test if you get a colonoscopy every 10 years.  Flexible sigmoidoscopy** or colonoscopy.** / Every 5 years for a flexible sigmoidoscopy or every 10 years for a colonoscopy beginning at age 50 and continuing until age 75.  Hepatitis C blood test.** / For all people born from 1945 through 1965 and any individual with known risks for hepatitis C.  Abdominal aortic aneurysm (AAA) screening./ Screening current or former smokers or have Hypertension.  Skin self-exam. / Monthly.  Influenza vaccine. / Every year.  Tetanus, diphtheria, and acellular pertussis (Tdap/Td) vaccine.** / 1 dose of Td every 10 years.   Zoster vaccine.** / 1 dose for adults aged 60 years or older.         Pneumococcal 13-valent conjugate (PCV13) vaccine.    Pneumococcal polysaccharide (PPSV23) vaccine.     Hepatitis A vaccine.** / Consult your health care provider.  Hepatitis B vaccine.** / Consult your health care provider. Screening for abdominal aortic aneurysm (AAA)  by ultrasound is recommended for people who have history of high blood pressure or who are current or former smokers. ++++++++++ Recommend Adult Low Dose Aspirin or  coated  Aspirin 81 mg daily  To reduce risk of Colon Cancer 20 %,   Skin Cancer 26 % ,  Malignant Melanoma 46%  and  Pancreatic cancer 60% ++++++++++++++++++++++ Vitamin D goal  is between 70-100.  Please make sure that you are taking your Vitamin D as directed.  It is very important as a natural anti-inflammatory  helping hair, skin, and nails, as well as reducing stroke and heart attack risk.  It helps your bones and helps with mood. It also decreases numerous cancer risks so please take it as directed.  Low Vit D is associated with a 200-300% higher risk for CANCER  and 200-300% higher risk for HEART   ATTACK  &  STROKE.   ...................................... It is also associated with higher death rate at younger ages,  autoimmune diseases like Rheumatoid arthritis, Lupus, Multiple Sclerosis.    Also many other serious conditions, like depression, Alzheimer's Dementia, infertility, muscle aches, fatigue, fibromyalgia - just to name a few. ++++++++++++++++++++++ Recommend the book "The END of DIETING" by Dr Joel Fuhrman  & the book "The END of DIABETES " by Dr Joel Fuhrman At [employer] - get book & Audio CD's    Being diabetic has a  300% increased risk for heart attack, stroke, cancer, and alzheimer- type vascular dementia. It is very important that you work harder with diet by avoiding all foods that are white. Avoid white rice (brown & wild rice is OK), white potatoes (sweetpotatoes in moderation is OK), White bread or wheat bread or anything made out of white flour like bagels, donuts, rolls, buns, biscuits, cakes, pastries, cookies, pizza crust, and pasta (made from white flour & egg whites) - vegetarian pasta or spinach or wheat   pasta is OK. Multigrain breads like Arnold's or [employer], or multigrain sandwich thins or flatbreads.  Diet, exercise and weight loss can reverse and cure diabetes in the early stages.  Diet, exercise and weight loss is very important in the control and prevention of complications of diabetes which affects every  system in your body, ie. Brain - dementia/stroke, eyes - glaucoma/blindness, heart - heart attack/heart failure, kidneys - dialysis, stomach - gastric paralysis, intestines - malabsorption, nerves - severe painful neuritis, circulation - gangrene & loss of a leg(s), and finally cancer and Alzheimers.    I recommend avoid fried & greasy foods,  sweets/candy, white rice (brown or wild rice or Quinoa is OK), white potatoes (sweet potatoes are OK) - anything made from white flour - bagels, doughnuts, rolls, buns, biscuits,white and wheat breads, pizza crust and traditional pasta made of white flour & egg white(vegetarian pasta or spinach or wheat pasta is OK).  Multi-grain bread is OK - like multi-grain flat bread or sandwich thins. Avoid alcohol in excess. Exercise is also important.    Eat all the vegetables you want - avoid meat, especially red meat and dairy - especially cheese.  Cheese is the most concentrated form of trans-fats which is the worst thing to clog up our arteries. Veggie cheese is OK which can be found in the fresh produce section at [employer] or [employer] or [employer]  ++++++++++++++++++++++ DASH Eating Plan  DASH stands for "Dietary Approaches to Stop Hypertension."   The DASH eating plan is a healthy eating plan that has been shown to reduce high blood pressure (hypertension). Additional health benefits may include reducing the risk of type 2 diabetes mellitus, heart disease, and stroke. The DASH eating plan may also help with weight loss. WHAT DO I NEED TO KNOW ABOUT THE DASH EATING PLAN? For the DASH eating plan, you will follow these general guidelines:  Choose foods with a percent daily value for sodium of less than 5% (as listed on the food label).  Use salt-free seasonings or herbs instead of table salt or sea salt.  Check with your health care provider or pharmacist before using salt substitutes.  Eat lower-sodium products, often labeled as "lower sodium" or "no  salt added."  Eat fresh foods.  Eat more vegetables, fruits, and low-fat dairy products.  Choose whole grains. Look for the word "whole" as the first word in the ingredient list.  Choose fish   Limit sweets, desserts, sugars, and sugary drinks.  Choose heart-healthy fats.  Eat veggie cheese   Eat more home-cooked food and less restaurant, buffet, and fast food.  Limit fried foods.  Cook foods using methods other than frying.  Limit canned vegetables. If you do use them, rinse them well to decrease the sodium.  When eating at a restaurant, ask that your food be prepared with less salt, or no salt if possible.                      WHAT FOODS CAN I EAT? Read Dr Joel Fuhrman's books on The End of Dieting & The End of Diabetes  Grains Whole grain or whole wheat bread. Brown rice. Whole grain or whole wheat pasta. Quinoa, bulgur, and whole grain cereals. Low-sodium cereals. Corn or whole wheat flour tortillas. Whole grain cornbread. Whole grain crackers. Low-sodium crackers.  Vegetables Fresh or frozen vegetables (raw, steamed, roasted, or grilled). Low-sodium or reduced-sodium tomato and vegetable juices. Low-sodium or reduced-sodium tomato sauce and paste. Low-sodium   or reduced-sodium canned vegetables.   Fruits All fresh, canned (in natural juice), or frozen fruits.  Protein Products  All fish and seafood.  Dried beans, peas, or lentils. Unsalted nuts and seeds. Unsalted canned beans.  Dairy Low-fat dairy products, such as skim or 1% milk, 2% or reduced-fat cheeses, low-fat ricotta or cottage cheese, or plain low-fat yogurt. Low-sodium or reduced-sodium cheeses.  Fats and Oils Tub margarines without trans fats. Light or reduced-fat mayonnaise and salad dressings (reduced sodium). Avocado. Safflower, olive, or canola oils. Natural peanut or almond butter.  Other Unsalted popcorn and pretzels. The items listed above may not be a complete list of recommended foods or  beverages. Contact your dietitian for more options.  ++++++++++++++++++++  WHAT FOODS ARE NOT RECOMMENDED? Grains/ White flour or wheat flour White bread. White pasta. White rice. Refined cornbread. Bagels and croissants. Crackers that contain trans fat.  Vegetables  Creamed or fried vegetables. Vegetables in a . Regular canned vegetables. Regular canned tomato sauce and paste. Regular tomato and vegetable juices.  Fruits Dried fruits. Canned fruit in light or heavy syrup. Fruit juice.  Meat and Other Protein Products Meat in general - RED meat & White meat.  Fatty cuts of meat. Ribs, chicken wings, all processed meats as bacon, sausage, bologna, salami, fatback, hot dogs, bratwurst and packaged luncheon meats.  Dairy Whole or 2% milk, cream, half-and-half, and cream cheese. Whole-fat or sweetened yogurt. Full-fat cheeses or blue cheese. Non-dairy creamers and whipped toppings. Processed cheese, cheese spreads, or cheese curds.  Condiments Onion and garlic salt, seasoned salt, table salt, and sea salt. Canned and packaged gravies. Worcestershire sauce. Tartar sauce. Barbecue sauce. Teriyaki sauce. Soy sauce, including reduced sodium. Steak sauce. Fish sauce. Oyster sauce. Cocktail sauce. Horseradish. Ketchup and mustard. Meat flavorings and tenderizers. Bouillon cubes. Hot sauce. Tabasco sauce. Marinades. Taco seasonings. Relishes.  Fats and Oils Butter, stick margarine, lard, shortening and bacon fat. Coconut, palm kernel, or palm oils. Regular salad dressings.  Pickles and olives. Salted popcorn and pretzels.  The items listed above may not be a complete list of foods and beverages to avoid.    

## 2019-03-06 NOTE — Progress Notes (Signed)
[facility]   [name], M.D.     Uvaldo Bristle. Silverio Lay, P.A.-C [name], [place]                [place] [address] Telephone [phone] Telefax [phone] Comprehensive Evaluation & Examination  History of Present Illness:     This very nice 73 y.o. divorced WM of  [place] (in [place] since 1960)  presents for a  comprehensive evaluation and management of multiple medical co-morbidities.  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 Patient has hx/o Gout & has been on & off Allopurinol.      HTN predates circa 1970. Patient's BP has been controlled at home.  Today's BP was initially slightly elevated & rechecked at goal - 136/86. In 2006, he had a Negative Heart Cath.  Patient denies any cardiac symptoms as chest pain, palpitations, shortness of breath, dizziness or ankle swelling.     Patient's hyperlipidemia is controlled with diet and Atorvastatin. Patient denies myalgias or other medication SE's. 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 Patient denies reactive hypoglycemic symptoms, visual blurring, diabetic polys or paresthesias.  Patient is not dietary compliant and today's A1c was not at goal: Lab Results  Component Value Date   HGBA1C 7.8 (H) 03/07/2019       Finally, patient has history of Vitamin D Deficiency and current vitamin D was at goal: Lab Results  Component Value Date   VD25OH 100 03/07/2019   Current Outpatient Medications on File Prior to Visit  Medication Sig  . allopurinol  (ZYLOPRIM) 300 MG tablet TAKE 1 TABLET BY MOUTH  DAILY  . aspirin 81 MG tablet Take 81 mg by mouth daily.    Marland Kitchen atorvastatin (LIPITOR) 20 MG tablet TAKE 1 TABLET BY MOUTH  DAILY  . bisoprolol-hydrochlorothiazide (ZIAC) 10-6.25 MG tablet TAKE 1 TABLET BY MOUTH  DAILY  . Cholecalciferol (VITAMIN D PO) Take 5,000 Units by mouth daily.  . diphenhydramine-acetaminophen (TYLENOL PM) 25-500 MG TABS tablet Take 1 tablet by mouth at bedtime as needed.   . furosemide (LASIX) 40 MG tablet TAKE 1 TABLET BY MOUTH  DAILY  . glucose blood test strip Use as instructed  . insulin NPH-regular Human (NOVOLIN 70/30) (70-30) 100 UNIT/ML injection Inject 25 units in the morning, 10 units at lunch if elevated, 10-15 units in the evening.  . Insulin Pen Needle 31G X 8 MM MISC Use one daily [place]  . Insulin Syringe-Needle U-100 31G X 15/64" 0.5 ML MISC Use two pens daily with insulin  . Loratadine (CLARITIN PO) Take by mouth daily.    Marland Kitchen losartan (COZAAR) 100 MG tablet TAKE 1 TABLET BY MOUTH  DAILY  . magnesium oxide (MAG-OX) 400 MG tablet Take 400 mg by mouth 2 (two) times daily.   . metFORMIN (GLUCOPHAGE-XR) 500 MG 24 hr tablet TAKE 2 TABLETS BY MOUTH TWO TIMES DAILY  . minoxidil (LONITEN) 10 MG tablet TAKE 1 TABLET  BY MOUTH  DAILY  . montelukast (SINGULAIR) 10 MG tablet TAKE 1 TABLET BY MOUTH  EVERY NIGHT AT BEDTIME  . Polyethyl Glycol-Propyl Glycol (SYSTANE OP) Apply to eye as needed.  . ranitidine (ZANTAC) 300 MG tablet TAKE 1 TABLET BY MOUTH AT  BEDTIME  . glipiZIDE (GLUCOTROL) 10 MG tablet TAKE 1 TABLET BY MOUTH 3  TIMES A DAY WITH MEALS (Patient not taking: Reported on 03/07/2019)   No current facility-administered medications on file prior to visit.    No Known Allergies   Past Medical History:  Diagnosis Date  . Allergy   . Colon polyp   . Diabetes mellitus without complication ([place])   . Dilated cardiomyopathy ([place])    non isch  . Gout   . Hemochromatosis   . Hypertension   . OSA (obstructive sleep  apnea)    Health Maintenance  Topic Date Due  . Hepatitis C Screening  29-Nov-1945  . OPHTHALMOLOGY EXAM  01/06/2019  . INFLUENZA VACCINE  05/25/2019  . HEMOGLOBIN A1C  09/07/2019  . FOOT EXAM  03/05/2020  . COLONOSCOPY  08/24/2020  . TETANUS/TDAP  01/22/2024  . PNA vac Low Risk Adult  Completed   Immunization History  Administered Date(s) Administered  . DT 01/21/2014  . Influenza Split 09/11/2013  . Influenza, High Dose Seasonal PF 07/27/2017  . Influenza,inj,Quad PF,6+ Mos 07/28/2014, 08/06/2018  . Influenza-Unspecified 08/21/2012  . Pneumococcal Conjugate-13 09/11/2014  . Pneumococcal-Unspecified 08/21/2012   Last Colon -  08/25/2015 - Dr Earlean Shawl recc 5 yr f/u due Nov 2021.  Past Surgical History:  Procedure Laterality Date  . EYE SURGERY Bilateral    Cataract   Family History  Problem Relation Age of Onset  . Diabetes Father   . Diabetes Other   . Hypertension Other   . Hyperlipidemia Other   . Heart disease Other   . Cancer Maternal Uncle        brain  . Heart attack Brother        6  . Diabetes Brother    Social History   Socioeconomic History  . Marital status: Single  . Number of children: 3 sons   Occupational History  . Retired  Tobacco Use  . Smoking status: Former Smoker    Packs/day: 3.00    Years: 19.00    Pack years: 57.00    Types: Cigarettes    Start date: 10/24/1957    Last attempt to quit: 11/24/1976    Years since quitting: 42.3  . Smokeless tobacco: Never Used  Substance and Sexual Activity  . Alcohol use: Yes    Comment: Occasionally  . Drug use: No  . Sexual activity: Not on file    ROS Constitutional: Denies fever, chills, weight loss/gain, headaches, insomnia,  night sweats or change in appetite. Does c/o fatigue. Eyes: Denies redness, blurred vision, diplopia, discharge, itchy or watery eyes.  ENT: Denies discharge, congestion, post nasal drip, epistaxis, sore throat, earache, hearing loss, dental pain, Tinnitus, Vertigo, Sinus  pain or snoring.  Cardio: Denies chest pain, palpitations, irregular heartbeat, syncope, dyspnea, diaphoresis, orthopnea, PND, claudication or edema Respiratory: denies cough, dyspnea, DOE, pleurisy, hoarseness, laryngitis or wheezing.  Gastrointestinal: Denies dysphagia, heartburn, reflux, water brash, pain, cramps, nausea, vomiting, bloating, diarrhea, constipation, hematemesis, melena, hematochezia, jaundice or hemorrhoids Genitourinary: Denies dysuria, frequency, discharge, hematuria or flank pain. Has urgency, nocturia x 2-3 & occasional hesitancy. Musculoskeletal: Denies arthralgia, myalgia, stiffness, Jt. Swelling, pain, limp or strain/sprain. Denies Falls. Skin: Denies puritis, rash, hives, warts, acne,  eczema or change in skin lesion Neuro: No weakness, tremor, incoordination, spasms, paresthesia or pain Psychiatric: Denies confusion, memory loss or sensory loss. Denies Depression. Endocrine: Denies change in weight, skin, hair change, nocturia, and paresthesia, diabetic polys, visual blurring or hyper / hypo glycemic episodes.  Heme/Lymph: No excessive bleeding, bruising or enlarged lymph nodes.  Physical Exam  BP 136/86   Pulse 60   Temp 97.6 F (36.4 C)   Resp 16   Ht 5\' 6"  (1.676 m)   Wt 216 lb (98 kg)   BMI 34.86 kg/m   General Appearance: Well nourished and well groomed and in no apparent distress.  Eyes: PERRLA, EOMs, conjunctiva no swelling or erythema, normal fundi and vessels. Sinuses: No frontal/maxillary tenderness ENT/Mouth: EACs patent / TMs  nl. Nares clear without erythema, swelling, mucoid exudates. Oral hygiene is good. No erythema, swelling, or exudate. Tongue normal, non-obstructing. Tonsils not swollen or erythematous. Hearing normal.  Neck: Supple, thyroid not palpable. No bruits, nodes or JVD. Respiratory: Respiratory effort normal.  BS equal and clear bilateral without rales, rhonci, wheezing or stridor. Cardio: Heart sounds are normal with regular rate  and rhythm and no murmurs, rubs or gallops. Peripheral pulses are normal and equal bilaterally without edema. No aortic or femoral bruits. Chest: symmetric with normal excursions and percussion.  Abdomen: Soft, with Nl bowel sounds. Nontender, no guarding, rebound, hernias, masses, or organomegaly.  Lymphatics: Non tender without lymphadenopathy.  Musculoskeletal: Full ROM all peripheral extremities, joint stability, 5/5 strength, and normal gait. Skin: Warm and dry without rashes, lesions, cyanosis, clubbing or  ecchymosis.  Neuro: Cranial nerves intact, reflexes equal bilaterally. Normal muscle tone, no cerebellar symptoms. Sensation intact to touch, vibratory and Monofilament to the toes bilaterally. Pysch: Alert and oriented X 3 with normal affect, insight and judgment appropriate.   Assessment and Plan  1. Essential hypertension  - EKG 12-Lead - [place], RETROPERITNL ABD,  LTD - Urinalysis, Routine w reflex microscopic - Microalbumin / creatinine urine ratio - CBC with Differential/Platelet - COMPLETE METABOLIC PANEL WITH GFR - Magnesium - TSH  2. Hyperlipidemia, mixed  - EKG 12-Lead - [place], RETROPERITNL ABD,  LTD - Lipid panel - TSH  3. 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 Vitamin D deficiency  - VITAMIN D 25 Hydroxyl  5. Hereditary hemochromatosis ([place])  - CBC with Differential/Platelet  6. Gastroesophageal reflux diseasel  - CBC with Differential/Platelet  7. Idiopathic gout  - Uric acid  8. OSA (obstructive sleep apnea)   9. Former smoker  - EKG 12-Lead - [place], RETROPERITNL ABD,  LTD  10. Screening for AAA (aortic abdominal aneurysm)  -  [place], RETROPERITNL ABD,  LTD  11. Screening for colorectal cancer  - POC Hemoccult Bld/Stl   12. Prostate cancer screening  - PSA  13. Bladder neck obstruction  - PSA  14. Screening for ischemic heart disease  - EKG 12-Lead  15. FHx: heart disease  - EKG 12-Lead - [place], RETROPERITNL ABD,  LTD  16. Medication management  - Urinalysis, Routine w reflex microscopic - Microalbumin / creatinine urine ratio - CBC with Differential/Platelet - COMPLETE METABOLIC PANEL WITH GFR - Magnesium - Lipid panel - TSH -  Hemoglobin A1c - VITAMIN D 25 Hydroxyl        Patient was counseled in prudent diet, weight control to achieve/maintain BMI less than 25, BP monitoring, regular exercise and medications as discussed.  Discussed med effects and SE's. Routine screening labs and tests as requested with regular follow-up as recommended. I discussed the assessment and treatment plan as above with the patient. The patient was provided an opportunity to ask questions and all were answered. The patient agreed with the plan and demonstrated an understanding of the instructions. I provided  over 40 minutes of exam, counseling, chart review and  complex critical decision making was performed  Kirtland Bouchard, MD

## 2019-03-07 ENCOUNTER — Other Ambulatory Visit: Payer: Self-pay

## 2019-03-07 ENCOUNTER — Ambulatory Visit (INDEPENDENT_AMBULATORY_CARE_PROVIDER_SITE_OTHER): Payer: Medicare Other | Admitting: Internal Medicine

## 2019-03-07 VITALS — BP 136/86 | HR 60 | Temp 97.6°F | Resp 16 | Ht 66.0 in | Wt 216.0 lb

## 2019-03-07 DIAGNOSIS — N183 Chronic kidney disease, stage 3 unspecified: Secondary | ICD-10-CM

## 2019-03-07 DIAGNOSIS — M1000 Idiopathic gout, unspecified site: Secondary | ICD-10-CM | POA: Diagnosis not present

## 2019-03-07 DIAGNOSIS — Z1211 Encounter for screening for malignant neoplasm of colon: Secondary | ICD-10-CM

## 2019-03-07 DIAGNOSIS — E0822 Diabetes mellitus due to underlying condition with diabetic chronic kidney disease: Secondary | ICD-10-CM | POA: Diagnosis not present

## 2019-03-07 DIAGNOSIS — Z794 Long term (current) use of insulin: Secondary | ICD-10-CM

## 2019-03-07 DIAGNOSIS — I10 Essential (primary) hypertension: Secondary | ICD-10-CM | POA: Diagnosis not present

## 2019-03-07 DIAGNOSIS — Z8249 Family history of ischemic heart disease and other diseases of the circulatory system: Secondary | ICD-10-CM

## 2019-03-07 DIAGNOSIS — Z136 Encounter for screening for cardiovascular disorders: Secondary | ICD-10-CM

## 2019-03-07 DIAGNOSIS — E559 Vitamin D deficiency, unspecified: Secondary | ICD-10-CM

## 2019-03-07 DIAGNOSIS — G4733 Obstructive sleep apnea (adult) (pediatric): Secondary | ICD-10-CM | POA: Diagnosis not present

## 2019-03-07 DIAGNOSIS — Z87891 Personal history of nicotine dependence: Secondary | ICD-10-CM

## 2019-03-07 DIAGNOSIS — Z125 Encounter for screening for malignant neoplasm of prostate: Secondary | ICD-10-CM | POA: Diagnosis not present

## 2019-03-07 DIAGNOSIS — K219 Gastro-esophageal reflux disease without esophagitis: Secondary | ICD-10-CM | POA: Diagnosis not present

## 2019-03-07 DIAGNOSIS — Z79899 Other long term (current) drug therapy: Secondary | ICD-10-CM

## 2019-03-07 DIAGNOSIS — N320 Bladder-neck obstruction: Secondary | ICD-10-CM

## 2019-03-07 DIAGNOSIS — E782 Mixed hyperlipidemia: Secondary | ICD-10-CM

## 2019-03-07 MED ORDER — TADALAFIL 20 MG PO TABS: ORAL_TABLET | ORAL | 12 refills | Status: DC

## 2019-03-08 LAB — URIC ACID: Uric Acid, Serum: 6.1 mg/dL (ref 4.0–8.0)

## 2019-03-08 LAB — URINALYSIS, ROUTINE W REFLEX MICROSCOPIC
Bilirubin Urine: NEGATIVE
Glucose, UA: NEGATIVE
Hgb urine dipstick: NEGATIVE
Ketones, ur: NEGATIVE
Leukocytes,Ua: NEGATIVE
Nitrite: NEGATIVE
Protein, ur: NEGATIVE
Specific Gravity, Urine: 1.011 (ref 1.001–1.03)
pH: 6 (ref 5.0–8.0)

## 2019-03-08 LAB — COMPLETE METABOLIC PANEL WITH GFR
AG Ratio: 1.9 (calc) (ref 1.0–2.5)
ALT: 37 U/L (ref 9–46)
AST: 27 U/L (ref 10–35)
Albumin: 4.5 g/dL (ref 3.6–5.1)
Alkaline phosphatase (APISO): 38 U/L (ref 35–144)
BUN: 14 mg/dL (ref 7–25)
CO2: 32 mmol/L (ref 20–32)
Calcium: 10.1 mg/dL (ref 8.6–10.3)
Chloride: 102 mmol/L (ref 98–110)
Creat: 1.05 mg/dL (ref 0.70–1.18)
GFR, Est African American: 82 mL/min/{1.73_m2} (ref >=60)
GFR, Est Non African American: 71 mL/min/{1.73_m2} (ref >=60)
Globulin: 2.4 g/dL (calc) (ref 1.9–3.7)
Glucose, Bld: 85 mg/dL (ref 65–99)
Potassium: 4 mmol/L (ref 3.5–5.3)
Sodium: 143 mmol/L (ref 135–146)
Total Bilirubin: 0.8 mg/dL (ref 0.2–1.2)
Total Protein: 6.9 g/dL (ref 6.1–8.1)

## 2019-03-08 LAB — CBC WITH DIFFERENTIAL/PLATELET
Absolute Monocytes: 641 cells/uL (ref 200–950)
Basophils Absolute: 53 cells/uL (ref 0–200)
Basophils Relative: 0.6 %
Eosinophils Absolute: 125 cells/uL (ref 15–500)
Eosinophils Relative: 1.4 %
HCT: 42.6 % (ref 38.5–50.0)
Hemoglobin: 15.2 g/dL (ref 13.2–17.1)
Lymphs Abs: 1744 cells/uL (ref 850–3900)
MCH: 32.7 pg (ref 27.0–33.0)
MCHC: 35.7 g/dL (ref 32.0–36.0)
MCV: 91.6 fL (ref 80.0–100.0)
MPV: 10.1 fL (ref 7.5–12.5)
Monocytes Relative: 7.2 %
Neutro Abs: 6337 cells/uL (ref 1500–7800)
Neutrophils Relative %: 71.2 %
Platelets: 229 10*3/uL (ref 140–400)
RBC: 4.65 10*6/uL (ref 4.20–5.80)
RDW: 13.1 % (ref 11.0–15.0)
Total Lymphocyte: 19.6 %
WBC: 8.9 10*3/uL (ref 3.8–10.8)

## 2019-03-08 LAB — LIPID PANEL
Cholesterol: 112 mg/dL (ref <200)
HDL: 38 mg/dL — ABNORMAL LOW (ref >=40)
LDL Cholesterol (Calc): 51 mg/dL (calc)
Non-HDL Cholesterol (Calc): 74 mg/dL (calc) (ref <130)
Total CHOL/HDL Ratio: 2.9 (calc) (ref <5.0)
Triglycerides: 150 mg/dL — ABNORMAL HIGH (ref <150)

## 2019-03-08 LAB — HEMOGLOBIN A1C
Hgb A1c MFr Bld: 7.8 % of total Hgb — ABNORMAL HIGH (ref <5.7)
Mean Plasma Glucose: 177 (calc)
eAG (mmol/L): 9.8 (calc)

## 2019-03-08 LAB — PTH, INTACT AND CALCIUM
Calcium: 10.1 mg/dL (ref 8.6–10.3)
PTH: 37 pg/mL (ref 14–64)

## 2019-03-08 LAB — MICROALBUMIN / CREATININE URINE RATIO
Creatinine, Urine: 25 mg/dL (ref 20–320)
Microalb Creat Ratio: 12 ug/mg{creat} (ref <30)
Microalb, Ur: 0.3 mg/dL

## 2019-03-08 LAB — PSA: PSA: 0.5 ng/mL (ref <=4.0)

## 2019-03-08 LAB — TSH: TSH: 2.97 mIU/L (ref 0.40–4.50)

## 2019-03-08 LAB — VITAMIN D 25 HYDROXY (VIT D DEFICIENCY, FRACTURES): Vit D, 25-Hydroxy: 100 ng/mL (ref 30–100)

## 2019-03-08 LAB — MAGNESIUM: Magnesium: 2 mg/dL (ref 1.5–2.5)

## 2019-03-10 ENCOUNTER — Encounter: Payer: Self-pay | Admitting: Internal Medicine

## 2019-03-11 ENCOUNTER — Other Ambulatory Visit: Payer: Self-pay | Admitting: Physician Assistant

## 2019-03-11 ENCOUNTER — Telehealth: Payer: Self-pay

## 2019-03-11 NOTE — Telephone Encounter (Signed)
Patient has been informed to use [facility] card. (tadalafil) Spoke wit Mr. Thurner on May 18 th 2020.

## 2019-03-19 ENCOUNTER — Other Ambulatory Visit: Payer: Self-pay | Admitting: Internal Medicine

## 2019-03-26 ENCOUNTER — Other Ambulatory Visit: Payer: Self-pay

## 2019-03-26 DIAGNOSIS — Z1211 Encounter for screening for malignant neoplasm of colon: Secondary | ICD-10-CM

## 2019-03-26 LAB — POC HEMOCCULT BLD/STL (HOME/3-CARD/SCREEN)
Card #2 Fecal Occult Blod, POC: NEGATIVE
Card #3 Fecal Occult Blood, POC: NEGATIVE
Fecal Occult Blood, POC: NEGATIVE

## 2019-03-27 DIAGNOSIS — Z1211 Encounter for screening for malignant neoplasm of colon: Secondary | ICD-10-CM | POA: Diagnosis not present

## 2019-04-02 ENCOUNTER — Encounter: Payer: Self-pay | Admitting: Adult Health Nurse Practitioner

## 2019-04-02 ENCOUNTER — Other Ambulatory Visit: Payer: Self-pay

## 2019-04-02 ENCOUNTER — Telehealth: Payer: Self-pay

## 2019-04-02 ENCOUNTER — Ambulatory Visit (INDEPENDENT_AMBULATORY_CARE_PROVIDER_SITE_OTHER): Payer: Medicare Other | Admitting: Adult Health Nurse Practitioner

## 2019-04-02 VITALS — BP 140/100 | HR 62 | Temp 97.7°F | Wt 217.4 lb

## 2019-04-02 DIAGNOSIS — R600 Localized edema: Secondary | ICD-10-CM | POA: Diagnosis not present

## 2019-04-02 DIAGNOSIS — Z6835 Body mass index (BMI) 35.0-35.9, adult: Secondary | ICD-10-CM

## 2019-04-02 DIAGNOSIS — E1122 Type 2 diabetes mellitus with diabetic chronic kidney disease: Secondary | ICD-10-CM

## 2019-04-02 DIAGNOSIS — N182 Chronic kidney disease, stage 2 (mild): Secondary | ICD-10-CM

## 2019-04-02 DIAGNOSIS — R350 Frequency of micturition: Secondary | ICD-10-CM

## 2019-04-02 DIAGNOSIS — I10 Essential (primary) hypertension: Secondary | ICD-10-CM

## 2019-04-02 DIAGNOSIS — K219 Gastro-esophageal reflux disease without esophagitis: Secondary | ICD-10-CM | POA: Diagnosis not present

## 2019-04-02 DIAGNOSIS — E11649 Type 2 diabetes mellitus with hypoglycemia without coma: Secondary | ICD-10-CM

## 2019-04-02 MED ORDER — FAMOTIDINE 20 MG PO TABS: 40.0000 mg | ORAL_TABLET | Freq: Every day | ORAL | 1 refills | Status: DC

## 2019-04-02 MED ORDER — FAMOTIDINE 20 MG PO TABS: ORAL_TABLET | ORAL | 1 refills | Status: DC

## 2019-04-02 MED ORDER — INSULIN NPH ISOPHANE & REGULAR (70-30) 100 UNIT/ML ~~LOC~~ SUSP: SUBCUTANEOUS | 4 refills | Status: DC

## 2019-04-02 NOTE — Progress Notes (Signed)
Assessment and Plan:  Brett [name] was seen today for evaluation of BLE, Diabetes management, and frequency of micturition.  Discussed at length with patient importance of following insulin prescription and monitoring glucose and informing office with hyper and especially hypoglycemia for adjustment.  Discussed increased urination is from edema, elevated blood pressure and uncontrolled glucose.  Will check UA today to ensure no bacteria involvement.  We have reduced overall insulin to prevent hypoglycemia.  Strict instructions to contact office should this persist with new regiment!!!  Close follow up via phone with daughter present to review glucose log and check on edema.  Diagnoses and all orders for this visit:   Uncontrolled type 2 diabetes mellitus with hypoglycemia without coma ([place]) Lowering overall dosing related to hypoglycemia and not providing for patient self adjustment at this time.  Will likely need to increase this related to poor self care. -     insulin NPH-regular Human (NOVOLIN 70/30) (70-30) 100 UNIT/ML injection; Inject 15 units in the morning, 8 units at lunch if above 100, 8 units in the evening. Continue Metformin 1,000mg  BID Discussed keeping glucose written log to better visualize what patient is eating and effect on blood glucose.  Bilateral lower extremity edema Increase lasix:  Lasix 40mg  twice a day for 5 days, may need to increase amount if no diuresis? Discussed increasing activity, walk daily, DO NOT SIT IN CHAIR ALL DAY! Compression stockings to assist with edema, knee or thigh high. Elevate legs above heart to help reduce edema  Essential hypertension Monitor blood pressure at home [place]!  Frequency of micturition Likely form combination of HTN uncontrolled, BLE and hyperglycemia.  Also needs to increase amount of water he drinks during the day.  Reports maybe one glass, THIS IS NOT ENGOUGH!!!  80oz of water daily, fill waterbottle as  reminder. -     Urinalysis w microscopic + reflex cultur Controlling B/P, glucose and edema will help to reduce this  Gastroesophageal reflux disease without esophagitis -     Discontinue: famotidine (PEPCID) 20 MG tablet; Take one tablet by mouth once a day       -  D/C ranitidine  Monitor symptoms Avoid triggers and monitor diet  CKD stage 2 due to type 2 diabetes mellitus (HCC) Increase fluids, avoid NSAIDS, monitor sugars, will monitor   BMI 35.0-35.9,adult Discussed dietary and exercise modifications     Further disposition pending results of labs. Discussed med's effects and SE's.   Over 30 minutes of interview, exam, counseling, chart review, and critical decision making was performed.   Future Appointments  Date Time Provider [place]  04/08/2019  7:45 AM [facility] 3 [facility] None  04/09/2019  8:15 AM [name], MD [facility] None  06/10/2019  9:30 AM [name], NP [facility] None  08/09/2019  8:00 AM [facility] 2 [facility] None  08/09/2019  8:30 AM [name], MD [facility] None  09/12/2019  9:30 AM [name], MD [facility] None  03/10/2020  9:00 AM [name], MD [facility] None    ------------------------------------------------------------------------------------------------------------------   HPI 73 y.o.male presents for evaluation of both lower extremities for edema.  He reports his left leg has started hurting with some calf muscle tightness making it difficult to walk.  This has been progressively getting worse over the past three weeks.  He has not taken anything for this.  His granddaughter assisted him with epson salt soaks for both legs and that seemed to help some.  Reports that during the day he sits in a chair on the computer.  He does not participate in purposeful exercise or walking.  Family concerned about this and have tried to encourage him to move around more and elevate his legs.  The only time he  elevates his legs is when he goes to sleep.  They are also concerned about the management of his diabetes.  He is currently giving himself 45units of 70/30 around lunchtime for glucose reading in the 350's-380's.  He is having hypoglycemia in the mornings 40's-50's.  He is wearing a freestyle and able to check his glucose regularly.  Although he reports he does not look back at the history log to review.  During the appointment I reviewed this log and it is accurate to values above.  Reports he makes changes to amount of insulin based on his readings.  Family reports he tells them he will just give himself more insulin to cover poor dietary choices.  He is not compliant with dietary adjustments for his diabetes and does not monitor sodium intake.  He is accompanied by his granddaughter and his daughter joined [place] during the appointment. There is concern that the patient is taking zantac for reflux and this has been recalled.  They are dissatisfied that they were not contacted for this.  Discussed this at length with patient and family.  Dr. Melford Aase also joined the appointment to discuss this with patient and family. Medication regiment will be changed to famotidine daily.  All parties were given opportunity to ask questions, all were answered and al agree on plan going forward.  Granddaughter left the room after this conversation. Patient's blood pressure is elevated today, he is asymptomatic.  He is not sure if he took his blood pressure medication or not.  He also reports that he is going to the bathroom frequently and all through the night. We discussed at length diabetes management, diet, checking glucose and dangers of hypoglycemia.  He can not tell when his sugar is low which makes this even more concerning.  Discussed this current management will lead him to hospitalization if not changed.  He has agreed to allow his daughter to assist with his medication management.  Past Medical History:  Diagnosis  Date  . Allergy   . Colon polyp   . Diabetes mellitus without complication ([place])   . Dilated cardiomyopathy ([place])    non isch  . Gout   . Hemochromatosis   . Hypertension   . OSA (obstructive sleep apnea)      No Known Allergies  Current Outpatient Medications on File Prior to Visit  Medication Sig  . allopurinol (ZYLOPRIM) 300 MG tablet TAKE 1 TABLET BY MOUTH  DAILY  . aspirin 81 MG tablet Take 81 mg by mouth daily.    Marland Kitchen atorvastatin (LIPITOR) 20 MG tablet TAKE 1 TABLET BY MOUTH  DAILY  . bisoprolol-hydrochlorothiazide (ZIAC) 10-6.25 MG tablet TAKE 1 TABLET BY MOUTH  DAILY  . Cholecalciferol (VITAMIN D PO) Take 5,000 Units by mouth daily.  . diphenhydramine-acetaminophen (TYLENOL PM) 25-500 MG TABS tablet Take 1 tablet by mouth at bedtime as needed.   . furosemide (LASIX) 40 MG tablet TAKE 1 TABLET BY MOUTH  DAILY  . glipiZIDE (GLUCOTROL) 10 MG tablet TAKE 1 TABLET BY MOUTH 3  TIMES A DAY WITH MEALS  . glucose blood test strip Use as instructed  . insulin NPH-regular Human (NOVOLIN 70/30) (70-30) 100 UNIT/ML injection Inject 25 units in the morning,  10 units at lunch if elevated, 10-15 units in the evening.  . Insulin Pen Needle 31G X 8 MM MISC Use one daily   . Insulin Syringe-Needle U-100 31G X 15/64" 0.5 ML MISC Use two pens daily with insulin  . Loratadine (CLARITIN PO) Take by mouth daily.    Marland Kitchen losartan (COZAAR) 100 MG tablet TAKE 1 TABLET BY MOUTH  DAILY  . magnesium oxide (MAG-OX) 400 MG tablet Take 400 mg by mouth 2 (two) times daily.   . metFORMIN (GLUCOPHAGE-XR) 500 MG 24 hr tablet TAKE 2 TABLETS BY MOUTH TWO TIMES DAILY  . minoxidil (LONITEN) 10 MG tablet Take 1 tablet Daily for BP  . montelukast (SINGULAIR) 10 MG tablet TAKE 1 TABLET BY MOUTH  EVERY NIGHT AT BEDTIME  . Polyethyl Glycol-Propyl Glycol (SYSTANE OP) Apply to eye as needed.  . ranitidine (ZANTAC) 300 MG tablet TAKE 1 TABLET BY MOUTH AT  BEDTIME  . tadalafil (CIALIS) 20 MG tablet Take 1/2 to 1 tablet  every 2 to 3 days as needed for XXXX (Sex)   No current facility-administered medications on file prior to visit.     ROS: Review of Systems  Constitutional: Negative for chills, diaphoresis, fever, malaise/fatigue and weight loss.  HENT: Negative for congestion, ear discharge, ear pain, hearing loss, nosebleeds, sinus pain, sore throat and tinnitus.   Eyes: Negative for blurred vision, double vision, photophobia, pain, discharge and redness.  Respiratory: Negative for cough, hemoptysis, sputum production, shortness of breath, wheezing and stridor.   Cardiovascular: Positive for leg swelling. Negative for chest pain, palpitations, orthopnea, claudication and PND.  Gastrointestinal: Negative for abdominal pain, blood in stool, constipation, diarrhea, heartburn, melena, nausea and vomiting.  Genitourinary: Positive for frequency. Negative for dysuria, flank pain, hematuria and urgency.  Musculoskeletal: Negative for back pain, falls, joint pain, myalgias and neck pain.  Skin: Negative for itching and rash.    Physical Exam:  BP (!) 140/100   Pulse 62   Temp 97.7 F (36.5 C)   Wt 217 lb 6.4 oz (98.6 kg)   SpO2 96%   BMI 35.09 kg/m   General Appearance: Well nourished, in no apparent distress. Eyes: PERRLA, EOMs, conjunctiva no swelling or erythema Sinuses: No Frontal/maxillary tenderness ENT/Mouth: Ext aud canals clear, TMs without erythema, bulging. No erythema, swelling, or exudate on post pharynx.  Tonsils not swollen or erythematous. Hearing normal.  Neck: Supple, thyroid normal.  Respiratory: Respiratory effort normal, BS equal bilaterally without rales, rhonchi, wheezing or stridor.  Cardio: RRR with no MRGs. Brisk peripheral pulses, no erythema noted, negative Homan's sign, +3 pitting edema isolated feet to knee. Abdomen: Soft obese, + BS.  Non tender, no guarding, rebound, hernias, masses. Lymphatics: Non tender without lymphadenopathy.  Musculoskeletal: Full ROM, 5/5  strength, normal gait.  Skin: Warm, dry without rashes, lesions, ecchymosis.  Neuro: Cranial nerves intact. Normal muscle tone, no cerebellar symptoms. Sensation intact.  Psych: Awake and oriented X 3, normal affect, Insight and Judgment appropriate.     Garnet Sierras, NP 2:43 PM [facility]

## 2019-04-02 NOTE — Telephone Encounter (Signed)
ENTERED IN ERROR

## 2019-04-02 NOTE — Patient Instructions (Addendum)
Tonight 5 units tonight before bed.  This is for one day only   Wednesday please start  Insulin 70/30   Please follow these instructions  Inject 15 units in the morning  Inject 8 units at lunch IF above 100.  IF lower DO NOT GIVE!  Inject 8 units at bedtime    Friday - Phone appointment with Garnet Sierras, NP    [place]   Do not stand or sit in one position for long periods of time. Do not sit with your legs crossed. Rest with your legs raised during the day.  Your legs have to be higher than your heart so that gravity will force the valves to open, so please really elevate your legs.   Wear elastic stockings or support hose. Do not wear other tight, encircling garments around the legs, pelvis, or waist.  [employer]  has a wide variety of well priced compression stockings. [place], [place] [address] [address] [place] has a good cheap selection, I like the socks, they are not as hard to get on  Walk as much as possible to increase blood flow.  Raise the foot of your bed at night with 2-inch blocks. SEEK MEDICAL CARE IF:   The skin around your ankle starts to break down.  You have pain, redness, tenderness, or hard swelling developing in your leg over a vein.  You are uncomfortable due to leg pain. Document Released: 07/20/2005 Document Revised: 01/02/2012 Document Reviewed: 12/06/2010 [facility] Patient Information 2014 [place].   Lasix 40mg , take twice a day for 5 days.  THEN we will resume Lasix 40mg  once a day.    We have changed your zantac to Pepsid (Famotidine) 20mg  daily.

## 2019-04-03 ENCOUNTER — Other Ambulatory Visit: Payer: Self-pay | Admitting: *Deleted

## 2019-04-03 LAB — URINALYSIS W MICROSCOPIC + REFLEX CULTURE
Bacteria, UA: NONE SEEN /HPF
Bilirubin Urine: NEGATIVE
Hgb urine dipstick: NEGATIVE
Hyaline Cast: NONE SEEN /LPF
Leukocyte Esterase: NEGATIVE
Nitrites, Initial: NEGATIVE
Protein, ur: NEGATIVE
RBC / HPF: NONE SEEN /HPF (ref 0–2)
Specific Gravity, Urine: 1.029 (ref 1.001–1.03)
Squamous Epithelial / HPF: NONE SEEN /HPF (ref <=5)
WBC, UA: NONE SEEN /HPF (ref 0–5)
pH: <=5 (ref 5.0–8.0)

## 2019-04-03 LAB — NO CULTURE INDICATED

## 2019-04-03 MED ORDER — FAMOTIDINE 40 MG PO TABS: ORAL_TABLET | ORAL | 3 refills | Status: DC

## 2019-04-04 NOTE — Progress Notes (Signed)
I connected with  Brett [name] on 04/04/19 by telephone and verified that I am speaking with the correct person using two identifiers.  Call was on speaker and his daughter Brett [name] was present during this phone call.  Patient giver verbal permission for daughter to be involved in the phone call and care management.   I discussed the limitations of evaluation and management by telemedicine. The patient expressed understanding and agreed to proceed.  Patient: Home Practitioner: Home office  Assessment and Plan:  Brett [name] was seen today for follow up on evaluation of BLE, Diabetes management, and frequency of micturition.     Discussed at length with patient importance of following insulin prescription and monitoring glucose and informing office with hyper and especially hypoglycemia for adjustment. Close follow up in four days via phone.  Change to in office if insulin pen education is requested.  Diagnoses and all orders for this visit:   Uncontrolled type 2 diabetes mellitus with hypoglycemia without coma ([place]) No hypoglycemia noted. Will increase dosing at this time.   Previous regiment:-     insulin NPH-regular Human (NOVOLIN 70/30) (70-30) 100 UNIT/ML injection; Inject 15 units in the morning, 8 units at lunch if above 100, 8 units in the evening. NOW: 20 units in morning, 18 at lunch and 16 at dinner. RX-Novolin 70/30 flex pen Concern of ability to draw up insulin amounts correctly. Will check cost. Continue Metformin 1,000mg  BID Continue glucose log  Bilateral lower extremity edema Improved as reported by daughter, +2? Lasix 40mg  twice a day for 5 days, has three days remaining. Discussed increasing activity, walk daily, DO NOT SIT IN CHAIR ALL DAY! Compression stockings to assist with edema, knee or thigh high. Elevate legs above heart to help reduce edema Patient to weight self daily and record to help monitor symptoms Will reassess effectiveness in four days.  Essential  hypertension Monitor blood pressure at home, to purchase new B/P cuff BE SURE TO TAKE YOUR MEDICATION DAILY!  Frequency of micturition Likely form combination of HTN uncontrolled, BLE and hyperglycemia.  Also needs to increase amount of water he drinks during the day.   Has increase water intake to 80oz Controlling B/P, glucose and edema will help to reduce this  Gastroesophageal reflux disease without esophagitis -      famotidine (PEPCID) 20 MG tablet; Take one tablet by mouth once a day. Sent to mail order has not received yet.       -  D/C ranitidine  Monitor symptoms Avoid triggers and monitor diet  CKD stage 2 due to type 2 diabetes mellitus (HCC) Increase fluids, avoid NSAIDS, monitor sugars, will monitor   BMI 35.0-35.9,adult Discussed dietary and exercise modifications    Follow Up Instructions:  Discussed assessment and treatment plan with the patient. The patient was provided an opportunity to ask questions and all were answered. The patient agrees with the plan of care and demonstrates an understanding of the instructions.   The patient was advised to call back or seek an in-person evaluation if the symptoms worsen or if the condition fails to improve as anticipated.   I provided 30 minutes of non-face-to-face time during this encounter including interview, counseling, chart review, and critical decision making was preformed.    Future Appointments  Date Time Provider [place]  04/05/2019 10:00 AM Garnet Sierras, NP [facility] None  04/08/2019  7:45 AM [facility] 3 [facility] None  04/09/2019  8:15 AM [name], MD [facility] None  06/10/2019  9:30 AM [name], NP [facility] None  08/09/2019  8:00 AM [facility] LAB 2 [facility] None  08/09/2019  8:30 AM [name], MD [facility] None  09/12/2019  9:30 AM [name], MD [facility] None  03/10/2020  9:00 AM [name], MD [facility] None     ------------------------------------------------------------------------------------------------------------------   HPI 73 y.o.male presents for follow up on evaluation of both lower extremities for edema, diabetes management and frequency of micturition and was last seen three days ago.  He reports his left leg has has no longer been hurting or any pains with walking.  Reports the swelling is bilateral lower extremities has improved.  He has increased his water intake and been elevating his feel more.  He has not started any walking or exercise at this time.  He had not purchased any compression stockings as of today. Report he has had an increase in urination taking Lasix 40mg  BID.  He has three days remaining of the total 5 day increase as he forgot to increase this on Wed, day after our appointment.  His blood glucose ranges from 160-251, afternoon range 229-288 and evening 287-378 and reports eating macaroni and cheese the evening of the hyperglycemia.  He is no longer adjusting his insulin on his own and has followed prescription.  He has trouble drawing up the insulin in his syringe reporting difficulty seeing the numbers.  He has not used insulin pens in the past.  Discussed these at length and will send in prescription for medicine and pen needles to [facility].  Check on price to see if this is affordable.  We will have a close follow up on glucose readings and can change this to in person appointment should further instruction of insulin pens be necessary for diabetic teaching.  We discussed at length diabetes management, diet, checking glucose and dangers of hypoglycemia.  He can not tell when his sugar is low which makes this even more concerning.  Discussed this current management will lead him to hospitalization if not changed.  He has agreed to allow his daughter to assist with his medication management.  Past Medical History:  Diagnosis Date  . Allergy   . Colon polyp   .  Diabetes mellitus without complication ([place])   . Dilated cardiomyopathy ([place])    non isch  . Gout   . Hemochromatosis   . Hypertension   . OSA (obstructive sleep apnea)      No Known Allergies  Current Outpatient Medications on File Prior to Visit  Medication Sig  . allopurinol (ZYLOPRIM) 300 MG tablet TAKE 1 TABLET BY MOUTH  DAILY  . aspirin 81 MG tablet Take 81 mg by mouth daily.    Marland Kitchen atorvastatin (LIPITOR) 20 MG tablet TAKE 1 TABLET BY MOUTH  DAILY  . bisoprolol-hydrochlorothiazide (ZIAC) 10-6.25 MG tablet TAKE 1 TABLET BY MOUTH  DAILY  . Cholecalciferol (VITAMIN D PO) Take 5,000 Units by mouth daily.  . diphenhydramine-acetaminophen (TYLENOL PM) 25-500 MG TABS tablet Take 1 tablet by mouth at bedtime as needed.   . famotidine (PEPCID) 40 MG tablet Takes 1 tablet daily for indigestion and heart burn.  . furosemide (LASIX) 40 MG tablet TAKE 1 TABLET BY MOUTH  DAILY  . glucose blood test strip Use as instructed  . insulin NPH-regular Human (NOVOLIN 70/30) (70-30) 100 UNIT/ML injection Inject 15 units in the morning, 8 units at lunch if elevated, 8 units in the evening.  . Insulin Pen Needle 31G X 8 MM MISC Use one  daily [place]  . Insulin Syringe-Needle U-100 31G X 15/64" 0.5 ML MISC Use two pens daily with insulin  . Loratadine (CLARITIN PO) Take by mouth daily.    Marland Kitchen losartan (COZAAR) 100 MG tablet TAKE 1 TABLET BY MOUTH  DAILY  . magnesium oxide (MAG-OX) 400 MG tablet Take 400 mg by mouth 2 (two) times daily.   . metFORMIN (GLUCOPHAGE-XR) 500 MG 24 hr tablet TAKE 2 TABLETS BY MOUTH TWO TIMES DAILY  . minoxidil (LONITEN) 10 MG tablet Take 1 tablet Daily for BP  . montelukast (SINGULAIR) 10 MG tablet TAKE 1 TABLET BY MOUTH  EVERY NIGHT AT BEDTIME  . Polyethyl Glycol-Propyl Glycol (SYSTANE OP) Apply to eye as needed.  . tadalafil (CIALIS) 20 MG tablet Take 1/2 to 1 tablet every 2 to 3 days as needed for XXXX (Sex)   No current facility-administered medications on file prior to visit.      ROS: Review of Systems  Constitutional: Negative for chills, diaphoresis, fever, malaise/fatigue and weight loss.  HENT: Negative for congestion, ear discharge, ear pain, hearing loss, nosebleeds, sinus pain, sore throat and tinnitus.   Eyes: Negative for blurred vision, double vision, photophobia, pain, discharge and redness.  Respiratory: Negative for cough, hemoptysis, sputum production, shortness of breath, wheezing and stridor.   Cardiovascular: Positive for leg swelling. Negative for chest pain, palpitations, orthopnea, claudication and PND.  Gastrointestinal: Negative for abdominal pain, blood in stool, constipation, diarrhea, heartburn, melena, nausea and vomiting.  Genitourinary: Positive for frequency. Negative for dysuria, flank pain, hematuria and urgency.  Musculoskeletal: Negative for back pain, falls, joint pain, myalgias and neck pain.  Skin: Negative for itching and rash.    Physical Exam:  There were no vitals taken for this visit.  General Appearance: Well nourished, in no apparent distress. Eyes: PERRLA, EOMs, conjunctiva no swelling or erythema Sinuses: No Frontal/maxillary tenderness ENT/Mouth: Ext aud canals clear, TMs without erythema, bulging. No erythema, swelling, or exudate on post pharynx.  Tonsils not swollen or erythematous. Hearing normal.  Neck: Supple, thyroid normal.  Respiratory: Respiratory effort normal, BS equal bilaterally without rales, rhonchi, wheezing or stridor.  Cardio: RRR with no MRGs. Brisk peripheral pulses, no erythema noted, negative Homan's sign, +3 pitting edema isolated feet to knee. Abdomen: Soft obese, + BS.  Non tender, no guarding, rebound, hernias, masses. Lymphatics: Non tender without lymphadenopathy.  Musculoskeletal: Full ROM, 5/5 strength, normal gait.  Skin: Warm, dry without rashes, lesions, ecchymosis.  Neuro: Cranial nerves intact. Normal muscle tone, no cerebellar symptoms. Sensation intact.  Psych: Awake and  oriented X 3, normal affect, Insight and Judgment appropriate.     Garnet Sierras, NP 10:21 PM [facility]

## 2019-04-05 ENCOUNTER — Ambulatory Visit: Payer: Medicare Other | Admitting: Adult Health Nurse Practitioner

## 2019-04-05 ENCOUNTER — Other Ambulatory Visit: Payer: Self-pay

## 2019-04-05 DIAGNOSIS — N182 Chronic kidney disease, stage 2 (mild): Secondary | ICD-10-CM | POA: Diagnosis not present

## 2019-04-05 DIAGNOSIS — E11649 Type 2 diabetes mellitus with hypoglycemia without coma: Secondary | ICD-10-CM | POA: Diagnosis not present

## 2019-04-05 DIAGNOSIS — R350 Frequency of micturition: Secondary | ICD-10-CM | POA: Diagnosis not present

## 2019-04-05 DIAGNOSIS — Z6835 Body mass index (BMI) 35.0-35.9, adult: Secondary | ICD-10-CM | POA: Diagnosis not present

## 2019-04-05 DIAGNOSIS — I10 Essential (primary) hypertension: Secondary | ICD-10-CM | POA: Diagnosis not present

## 2019-04-05 DIAGNOSIS — E1122 Type 2 diabetes mellitus with diabetic chronic kidney disease: Secondary | ICD-10-CM

## 2019-04-05 DIAGNOSIS — K219 Gastro-esophageal reflux disease without esophagitis: Secondary | ICD-10-CM | POA: Diagnosis not present

## 2019-04-05 DIAGNOSIS — R600 Localized edema: Secondary | ICD-10-CM | POA: Diagnosis not present

## 2019-04-05 MED ORDER — NOVOLIN 70/30 FLEXPEN RELION (70-30) 100 UNIT/ML ~~LOC~~ SUPN: PEN_INJECTOR | SUBCUTANEOUS | 11 refills | Status: DC

## 2019-04-05 MED ORDER — INSULIN PEN NEEDLE 31G X 8 MM MISC: 3 refills | Status: DC

## 2019-04-06 ENCOUNTER — Encounter: Payer: Self-pay | Admitting: Adult Health Nurse Practitioner

## 2019-04-06 DIAGNOSIS — E11649 Type 2 diabetes mellitus with hypoglycemia without coma: Secondary | ICD-10-CM

## 2019-04-06 NOTE — Patient Instructions (Addendum)
We have sent in Novolin 70/30 flexPen prescription to [facility].  Should you want further education on this pen please contact office to change telephone visit to in office visit.  You can let me know if this is an affordable option for you on Tuesday when we follow up.  Daily insulin dosing as of 04/05/19  70/30 insulin: Breakfast:  20units Lunch:  18units Dinner:  16units  You may use up the insulin you have.  Consider having help to draw up the amount of insulin in the needles.  Then they will be "pre-filled" for you.  For example you can draw up the morning lunch and dinner insulin in the syringes so it is ready for you to give.   Continue to record your blood sugars until we have completed our adjustments.  Record your weight.  Weight yourself every morning.  This will help [place] to know if the lasix is effective in reducing your swelling. Put on compression stockings in the morning and take office at night. Start a walking routine.  Some walking is better than no walking.  Start with a short distance or amount of time, example 57minutes everyday.  Increase this every week by a few minutes.  We will follow up in four days to check on the leg swelling and glucose readings as well as blood pressure.

## 2019-04-08 ENCOUNTER — Other Ambulatory Visit: Payer: Self-pay

## 2019-04-08 ENCOUNTER — Inpatient Hospital Stay: Payer: Medicare Other | Attending: Oncology

## 2019-04-08 LAB — FERRITIN: Ferritin: 99 ng/mL (ref 24–336)

## 2019-04-08 MED ORDER — INSULIN PEN NEEDLE 31G X 8 MM MISC: 3 refills | Status: DC

## 2019-04-08 MED ORDER — NOVOLIN 70/30 FLEXPEN RELION (70-30) 100 UNIT/ML ~~LOC~~ SUPN: PEN_INJECTOR | SUBCUTANEOUS | 11 refills | Status: DC

## 2019-04-09 ENCOUNTER — Encounter: Payer: Self-pay | Admitting: Adult Health Nurse Practitioner

## 2019-04-09 ENCOUNTER — Ambulatory Visit: Payer: Medicare Other | Admitting: Adult Health Nurse Practitioner

## 2019-04-09 ENCOUNTER — Encounter (INDEPENDENT_AMBULATORY_CARE_PROVIDER_SITE_OTHER): Payer: Medicare Other | Admitting: Ophthalmology

## 2019-04-09 VITALS — BP 167/87 | HR 62 | Wt 211.2 lb

## 2019-04-09 DIAGNOSIS — N182 Chronic kidney disease, stage 2 (mild): Secondary | ICD-10-CM

## 2019-04-09 DIAGNOSIS — H35033 Hypertensive retinopathy, bilateral: Secondary | ICD-10-CM

## 2019-04-09 DIAGNOSIS — E11319 Type 2 diabetes mellitus with unspecified diabetic retinopathy without macular edema: Secondary | ICD-10-CM | POA: Diagnosis not present

## 2019-04-09 DIAGNOSIS — H43813 Vitreous degeneration, bilateral: Secondary | ICD-10-CM

## 2019-04-09 DIAGNOSIS — E1122 Type 2 diabetes mellitus with diabetic chronic kidney disease: Secondary | ICD-10-CM

## 2019-04-09 DIAGNOSIS — Z6834 Body mass index (BMI) 34.0-34.9, adult: Secondary | ICD-10-CM

## 2019-04-09 DIAGNOSIS — I10 Essential (primary) hypertension: Secondary | ICD-10-CM | POA: Diagnosis not present

## 2019-04-09 DIAGNOSIS — K219 Gastro-esophageal reflux disease without esophagitis: Secondary | ICD-10-CM

## 2019-04-09 DIAGNOSIS — R600 Localized edema: Secondary | ICD-10-CM | POA: Diagnosis not present

## 2019-04-09 DIAGNOSIS — R350 Frequency of micturition: Secondary | ICD-10-CM | POA: Diagnosis not present

## 2019-04-09 DIAGNOSIS — E113393 Type 2 diabetes mellitus with moderate nonproliferative diabetic retinopathy without macular edema, bilateral: Secondary | ICD-10-CM

## 2019-04-09 DIAGNOSIS — E11649 Type 2 diabetes mellitus with hypoglycemia without coma: Secondary | ICD-10-CM

## 2019-04-09 LAB — HM DIABETES EYE EXAM

## 2019-04-09 NOTE — Progress Notes (Signed)
I connected with  Brett [name] on 04/09/19 by telephone and verified that I am speaking with the correct person using two identifiers.  Call was on speaker and his daughter Brett [name] was present during this phone call.  Patient giver verbal permission for daughter to be involved in the phone call and care management.     I discussed the limitations of evaluation and management by telemedicine. The patient expressed understanding and agreed to proceed.  Patient: Home Practitioner: Home office  Assessment and Plan:  Leaf was seen today for follow up on evaluation of BLE, Diabetes management, and frequency of micturition.     Discussed at length with patient importance of following insulin prescription and monitoring glucose and informing office with hyper and especially hypoglycemia for adjustment. Close follow up in four days via phone.  Change to in office if insulin pen education is requested.  Diagnoses and all orders for this visit:   Uncontrolled type 2 diabetes mellitus with hypoglycemia without coma ([place]) No hypoglycemia noted. Will increase dosing at this time.   Previous regiment:-     insulin NPH-regular Human (NOVOLIN 70/30) (70-30) 100 UNIT/ML injection; Inject 15 units in the morning, 8 units at lunch if above 100, 8 units in the evening. NOW: 26 units in morning, 20 at lunch and 20 at dinner. RX-Novolin 70/30 flex pen Concern of ability to draw up insulin amounts correctly. Will check cost. Continue Metformin 1,000mg  BID Continue glucose log    Bilateral lower extremity edema Improved as reported by daughter, +2? Lasix 40mg  twice a day for 5 days, has three days remaining. Discussed increasing activity, walk daily, DO NOT SIT IN CHAIR ALL DAY! Compression stockings to assist with edema, knee or thigh high. Elevate legs above heart to help reduce edema Patient to weight self daily and record to help monitor symptoms Will reassess effectiveness in four days.  Essential  hypertension Monitor blood pressure at home, to purchase new B/P cuff BE SURE TO TAKE YOUR MEDICATION DAILY!  Frequency of micturition Mild improvement Likely form combination of HTN uncontrolled, BLE and hyperglycemia.  Also needs to increase amount of water he drinks during the day.   Has increase water intake to 80oz Controlling B/P, glucose and edema will help to reduce this  Gastroesophageal reflux disease without esophagitis -      famotidine (PEPCID) 20 MG tablet; Take one tablet by mouth once a day. Sent to mail order has not received yet.  Monitor symptoms Avoid triggers and monitor diet  CKD stage 2 due to type 2 diabetes mellitus (HCC) Increase fluids, avoid NSAIDS, monitor sugars, will monitor   BMI 35.0-35.9,adult Discussed dietary and exercise modifications    Follow Up Instructions:  Discussed assessment and treatment plan with the patient. The patient was provided an opportunity to ask questions and all were answered. The patient agrees with the plan of care and demonstrates an understanding of the instructions.   The patient was advised to call back or seek an in-person evaluation if the symptoms worsen or if the condition fails to improve as anticipated.   I provided 30 minutes of non-face-to-face time during this encounter including interview, counseling, chart review, and critical decision making was preformed.    Future Appointments  Date Time Provider Brett [name]  06/10/2019  9:30 AM Brett Comber, NP [facility] None  08/09/2019  8:00 AM [facility] 2 [facility] None  08/09/2019  8:30 AM Brett Pier, MD [facility] None  09/12/2019  9:30 AM Brett [name],  Brett Saxon, MD [facility] None  10/09/2019  8:15 AM Brett Pedro, MD [facility] None  03/10/2020  9:00 AM Brett Pinto, MD [facility] None    ------------------------------------------------------------------------------------------------------------------   HPI 73 y.o.male presents  for follow up on evaluation of both lower extremities for edema, diabetes management and frequency of micturition and was last seen three days ago.  He reports his left leg has has no longer been hurting or any pains with walking.  Reports the swelling is bilateral lower extremities has improved.  He has increased his water intake and been elevating his feel more.  He has not started any walking or exercise at this time.  He had not purchased any compression stockings as of today. Report he has had an increase in urination taking Lasix 40mg  BID.  He has three days remaining of the total 5 day increase as he forgot to increase this on Wed, day after our appointment.  His blood glucose ranges from 165-245, afternoon range 182-360 and evening 235-379.  He has not made any modifications to his diet.  He has been sticking to the prescribed insulin regiment. He is no longer adjusting his insulin on his own.  He has trouble drawing up the insulin in his syringe reporting difficulty seeing the numbers.  He has not used insulin pens in the past.  Discussed these at length and will send in prescription for medicine and pen needles to [facility]. Has not picked this up yet. They will check on price to see if this is affordable.  We will have a close follow up on glucose readings and can change this to in person appointment should further instruction of insulin pens be necessary for diabetic teaching.  We discussed at length diabetes management, diet, checking glucose and dangers of hypoglycemia.  He can not tell when his sugar is low which makes this even more concerning.  Discussed this current management will lead him to hospitalization if not changed.  He has agreed to allow his daughter to assist with his medication management.  Past Medical History:  Diagnosis Date  . Allergy   . Colon polyp   . Diabetes mellitus without complication ([place])   . 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 Gout   .  Hemochromatosis   . Hypertension   . OSA (obstructive sleep apnea)      No Known Allergies  Current Outpatient Medications on File Prior to Visit  Medication Sig  . allopurinol (ZYLOPRIM) 300 MG tablet TAKE 1 TABLET BY MOUTH  DAILY  . aspirin 81 MG tablet Take 81 mg by mouth daily.    Marland Kitchen atorvastatin (LIPITOR) 20 MG tablet TAKE 1 TABLET BY MOUTH  DAILY  . bisoprolol-hydrochlorothiazide (ZIAC) 10-6.25 MG tablet TAKE 1 TABLET BY MOUTH  DAILY  . Cholecalciferol (VITAMIN D PO) Take 5,000 Units by mouth daily.  . diphenhydramine-acetaminophen (TYLENOL PM) 25-500 MG TABS tablet Take 1 tablet by mouth at bedtime as needed.   . famotidine (PEPCID) 40 MG tablet Takes 1 tablet daily for indigestion and heart burn.  . furosemide (LASIX) 40 MG tablet TAKE 1 TABLET BY MOUTH  DAILY  . glucose blood test strip Use as instructed  . Insulin Pen Needle 31G X 8 MM MISC Use one daily [place]  . Insulin Syringe-Needle U-100 31G X 15/64" 0.5 ML MISC Use two pens daily with insulin  . Loratadine (CLARITIN PO) Take by mouth daily.    Marland Kitchen losartan (COZAAR) 100 MG tablet TAKE 1 TABLET  BY MOUTH  DAILY  . magnesium oxide (MAG-OX) 400 MG tablet Take 400 mg by mouth 2 (two) times daily.   . metFORMIN (GLUCOPHAGE-XR) 500 MG 24 hr tablet TAKE 2 TABLETS BY MOUTH TWO TIMES DAILY  . minoxidil (LONITEN) 10 MG tablet Take 1 tablet Daily for BP  . montelukast (SINGULAIR) 10 MG tablet TAKE 1 TABLET BY MOUTH  EVERY NIGHT AT BEDTIME  . Polyethyl Glycol-Propyl Glycol (SYSTANE OP) Apply to eye as needed.  . tadalafil (CIALIS) 20 MG tablet Take 1/2 to 1 tablet every 2 to 3 days as needed for XXXX (Sex)  . Insulin Isophane & Regular Human (NOVOLIN 70/30 FLEXPEN RELION) (70-30) 100 UNIT/ML PEN Inject 20units into subcutaneous skin 73min before breakfast, 18units at lunch and 16 units at dinner. (Patient not taking: Reported on 04/09/2019)   No current facility-administered medications on file prior to visit.     ROS: Review of Systems   Constitutional: Negative for chills, diaphoresis, fever, malaise/fatigue and weight loss.  HENT: Negative for congestion, ear discharge, ear pain, hearing loss, nosebleeds, sinus pain, sore throat and tinnitus.   Eyes: Negative for blurred vision, double vision, photophobia, pain, discharge and redness.  Respiratory: Negative for cough, hemoptysis, sputum production, shortness of breath, wheezing and stridor.   Cardiovascular: Positive for leg swelling. Negative for chest pain, palpitations, orthopnea, claudication and PND.  Gastrointestinal: Negative for abdominal pain, blood in stool, constipation, diarrhea, heartburn, melena, nausea and vomiting.  Genitourinary: Positive for frequency. Negative for dysuria, flank pain, hematuria and urgency.  Musculoskeletal: Negative for back pain, falls, joint pain, myalgias and neck pain.  Skin: Negative for itching and rash.    Physical Exam:  BP (!) 167/87   Pulse 62   Wt 211 lb 3.2 oz (95.8 kg)   BMI 34.09 kg/m   General : Well sounding patient in no apparent distress HEENT: no hoarseness, no cough for duration of visit Lungs: speaks in complete sentences, no audible wheezing, no apparent distress Neurological: alert, oriented x 3 Psychiatric: pleasant, judgement appropriate    Garnet Sierras, NP 3:34 PM [facility]

## 2019-04-12 ENCOUNTER — Ambulatory Visit: Payer: Medicare Other | Admitting: Adult Health Nurse Practitioner

## 2019-04-12 ENCOUNTER — Encounter: Payer: Self-pay | Admitting: Adult Health Nurse Practitioner

## 2019-04-12 ENCOUNTER — Other Ambulatory Visit: Payer: Self-pay

## 2019-04-12 DIAGNOSIS — I10 Essential (primary) hypertension: Secondary | ICD-10-CM | POA: Diagnosis not present

## 2019-04-12 MED ORDER — AMLODIPINE BESYLATE 5 MG PO TABS: 5.0000 mg | ORAL_TABLET | Freq: Every day | ORAL | 1 refills | Status: DC

## 2019-04-12 NOTE — Progress Notes (Signed)
7I connected with  [name] on 04/12/19 by telephone and verified that I am speaking with the correct person using two identifiers. His daughter [name] was present on the phone cal and has been assisting with his care management.   Patient gave verbal permission for daughter to be involved in the phone call and care management.    I discussed the limitations of evaluation and management by telemedicine. The patient expressed understanding and agreed to proceed.  Patient: Home Practitioner: Home office  Assessment and Plan:  Izear was seen today for follow up on evaluation of BLE, Diabetes management, and frequency of micturition.     Discussed at length with patient importance of following insulin prescription and monitoring glucose and informing office with hyper and especially hypoglycemia for adjustment. Close follow up in four days via phone.  Change to in office if insulin pen education is requested.  Diagnoses and all orders for this visit:   Uncontrolled type 2 diabetes mellitus with hypoglycemia without coma ([place]) No hypoglycemia noted. Will increase dosing at this time.   Previous regiment:-     insulin NPH-regular Human (NOVOLIN 70/30) (70-30) 100 UNIT/ML injection; Inject 15 units in the morning, 8 units at lunch if above 100, 8 units in the evening.  NOW: 26 units in morning, 20 at lunch and 20 at dinner. RX-Novolin 70/30 flex pen Concern of ability to draw up insulin amounts correctly. Will check cost. Continue Metformin 1,000mg  BID Continue glucose log    Bilateral lower extremity edema Improved as reported by daughter, +1? Lasix 40mg  twice a day for 5 days, has three days remaining. Discussed increasing activity, walk daily, DO NOT SIT IN CHAIR ALL DAY! Compression stockings to assist with edema, knee or thigh high. Elevate legs above heart to help reduce edema Patient to weight self daily and record to help monitor symptoms Will reassess effectiveness in four  days. Avoid sodium Increase water intake  Essential hypertension Monitor blood pressure at home and record Taking Ziac 10-6.25mg  daily, Lasix 40mg  daily, Cozaar 100mg  daily ADD: Norvasc 5mg  nightly Focus on dietarty changes as well.  Frequency of micturition Slight improvemnt Likely form combination of HTN uncontrolled, BLE and hyperglycemia.  Also needs to continue to increase amount of water he drinks during the day.   Controlling B/P, glucose and edema will help to reduce this  Gastroesophageal reflux disease without esophagitis -      famotidine (PEPCID) 20 MG tablet; Take one tablet by mouth once a day. Sent to mail order has not received yet.       -  D/C ranitidine  Monitor symptoms Avoid triggers and monitor diet  CKD stage 2 due to type 2 diabetes mellitus (HCC) Increase fluids, avoid NSAIDS, monitor sugars, will monitor   BMI 35.0-35.9,adult Discussed dietary and exercise modifications Discussed specific dietary modifications Provided electronic educational materials   Follow Up Instructions: Follow up in two weeks for follow up on glucose and B/P. Discussed assessment and treatment plan with the patient. The patient was provided an opportunity to ask questions and all were answered. The patient agrees with the plan of care and demonstrates an understanding of the instructions.   The patient was advised to call back or seek an in-person evaluation if the symptoms worsen or if the condition fails to improve as anticipated.   I provided 30 minutes of non-face-to-face time during this encounter including interview, counseling, chart review, and critical decision making was preformed.    Future Appointments  Date Time Provider [place]  06/10/2019  9:30 AM [name], NP [facility] None  08/09/2019  8:00 AM [facility] 2 [facility] None  08/09/2019  8:30 AM [name], MD [facility] None  09/12/2019  9:30 AM [name], MD [facility]  None  10/09/2019  8:15 AM [name], MD [facility] None  03/10/2020  9:00 AM [name], MD [facility] None    ------------------------------------------------------------------------------------------------------------------   HPI 73 y.o.male presents for follow up on evaluation of both lower extremities for edema, diabetes management and frequency of micturition and was last seen three days ago via telephone visit for close follow up.  His BLE edema has improved overall and he has been wearing OTC compression socks.  He has not started any type of walking regiment or increased activity.   His blood glucose ranges from 190-245, afternoon range 290-360 and evening [id number] and reports eating macaroni and cheese the evening of the hyperglycemia.  He is no longer adjusting his insulin on his own and has followed prescription.  He has trouble drawing up the insulin in his syringe reporting difficulty seeing the numbers.  He is taking metformin and maximized on this.  Discussed restarting glipizide 10mg  BID at breakfast and lunch. We discussed at length diabetes management, diet, checking glucose and dangers of hypoglycemia.  He can not tell when his sugar is low which makes this even more concerning.  Discussed this current management will lead him to hospitalization if not changed.  He has agreed to allow his daughter to assist with his medication management.  Past Medical History:  Diagnosis Date  . Allergy   . Colon polyp   . Diabetes mellitus without complication ([place])   . Dilated cardiomyopathy ([place])    non isch  . Gout   . Hemochromatosis   . Hypertension   . OSA (obstructive sleep apnea)      No Known Allergies  Current Outpatient Medications on File Prior to Visit  Medication Sig  . allopurinol (ZYLOPRIM) 300 MG tablet TAKE 1 TABLET BY MOUTH  DAILY  . aspirin 81 MG tablet Take 81 mg by mouth daily.    Marland Kitchen atorvastatin (LIPITOR) 20 MG tablet TAKE 1 TABLET BY MOUTH  DAILY   . bisoprolol-hydrochlorothiazide (ZIAC) 10-6.25 MG tablet TAKE 1 TABLET BY MOUTH  DAILY  . Cholecalciferol (VITAMIN D PO) Take 5,000 Units by mouth daily.  . diphenhydramine-acetaminophen (TYLENOL PM) 25-500 MG TABS tablet Take 1 tablet by mouth at bedtime as needed.   . famotidine (PEPCID) 40 MG tablet Takes 1 tablet daily for indigestion and heart burn.  . furosemide (LASIX) 40 MG tablet TAKE 1 TABLET BY MOUTH  DAILY  . glucose blood test strip Use as instructed  . Insulin Isophane & Regular Human (NOVOLIN 70/30 FLEXPEN RELION) (70-30) 100 UNIT/ML PEN Inject 20units into subcutaneous skin 45min before breakfast, 18units at lunch and 16 units at dinner. (Patient not taking: Reported on 04/09/2019)  . Insulin Pen Needle 31G X 8 MM MISC Use one daily [place]  . Insulin Syringe-Needle U-100 31G X 15/64" 0.5 ML MISC Use two pens daily with insulin  . Loratadine (CLARITIN PO) Take by mouth daily.    Marland Kitchen losartan (COZAAR) 100 MG tablet TAKE 1 TABLET BY MOUTH  DAILY  . magnesium oxide (MAG-OX) 400 MG tablet Take 400 mg by mouth 2 (two) times daily.   . metFORMIN (GLUCOPHAGE-XR) 500 MG 24 hr tablet TAKE 2 TABLETS BY MOUTH TWO TIMES DAILY  . minoxidil (LONITEN) 10  MG tablet Take 1 tablet Daily for BP  . montelukast (SINGULAIR) 10 MG tablet TAKE 1 TABLET BY MOUTH  EVERY NIGHT AT BEDTIME  . Polyethyl Glycol-Propyl Glycol (SYSTANE OP) Apply to eye as needed.  . tadalafil (CIALIS) 20 MG tablet Take 1/2 to 1 tablet every 2 to 3 days as needed for XXXX (Sex)   No current facility-administered medications on file prior to visit.     ROS: Review of Systems  Constitutional: Negative for chills, diaphoresis, fever, malaise/fatigue and weight loss.  HENT: Negative for congestion, ear discharge, ear pain, hearing loss, nosebleeds, sinus pain, sore throat and tinnitus.   Eyes: Negative for blurred vision, double vision, photophobia, pain, discharge and redness.  Respiratory: Negative for cough, hemoptysis, sputum  production, shortness of breath, wheezing and stridor.   Cardiovascular: Positive for leg swelling. Negative for chest pain, palpitations, orthopnea, claudication and PND.  Gastrointestinal: Negative for abdominal pain, blood in stool, constipation, diarrhea, heartburn, melena, nausea and vomiting.  Genitourinary: Positive for frequency. Negative for dysuria, flank pain, hematuria and urgency.  Musculoskeletal: Negative for back pain, falls, joint pain, myalgias and neck pain.  Skin: Negative for itching and rash.    Physical Exam:  There were no vitals taken for this visit.  General Appearance: Well nourished, in no apparent distress. Eyes: PERRLA, EOMs, conjunctiva no swelling or erythema Sinuses: No Frontal/maxillary tenderness ENT/Mouth: Ext aud canals clear, TMs without erythema, bulging. No erythema, swelling, or exudate on post pharynx.  Tonsils not swollen or erythematous. Hearing normal.  Neck: Supple, thyroid normal.  Respiratory: Respiratory effort normal, BS equal bilaterally without rales, rhonchi, wheezing or stridor.  Cardio: RRR with no MRGs. Brisk peripheral pulses, no erythema noted, negative Homan's sign, +3 pitting edema isolated feet to knee. Abdomen: Soft obese, + BS.  Non tender, no guarding, rebound, hernias, masses. Lymphatics: Non tender without lymphadenopathy.  Musculoskeletal: Full ROM, 5/5 strength, normal gait.  Skin: Warm, dry without rashes, lesions, ecchymosis.  Neuro: Cranial nerves intact. Normal muscle tone, no cerebellar symptoms. Sensation intact.  Psych: Awake and oriented X 3, normal affect, Insight and Judgment appropriate.     Garnet Sierras, NP 10:31 AM [facility]

## 2019-04-12 NOTE — Patient Instructions (Addendum)
   Today you had a telephone visit with Garnet Sierras, DNP.  Below is a summary of your visit.  70/30 Insulin:  Breakfast 26 units, Lunch 20 units, Dinner 20 units  Continue Metformin 500mg  two tablets with breakfast and two with dinner  Start taking Glipizide 10mg , one tablet with food at breakfast and and one tablet with lunch.  Start looking closley at your diet!  It is not that you CAN'T have foods, we just need to make modifications to improve your health.  Try a boiled egg with one croissant.  Add protein to your breakfast, [place] bacon (low sodium)  Continue to take your blood pressure twice a day. We are going to start Norvasc 5mg .  Take this every night before bed.  Continue to weigh self daily, check glucose three times a day and morning weights.  Follow up will be in two weeks via telephone.  If you have and new or worsening symptoms or any questions please don't hesitate to reach out.  We will contact you early next week to schedule this two week appointment   Sincerely,         Garnet Sierras, NP

## 2019-04-12 NOTE — Patient Instructions (Signed)
Your new insulin regiment is 70/30 Insulin. Inject 26 units in the morning, 20 units at lunch and 20 units at dinner.   Continue to record blood glucose readings three times a day.  Continue to monitor weight daily.  Take first thing in the morning.  Wear compression socks daily.  Increase activity and start a walking regiment for exercise.  Continue to check blood pressure daily in the morning and evening.  We will follow up in three day on glucose levels.

## 2019-04-17 ENCOUNTER — Encounter: Payer: Self-pay | Admitting: *Deleted

## 2019-04-30 ENCOUNTER — Encounter: Payer: Self-pay | Admitting: Adult Health Nurse Practitioner

## 2019-04-30 ENCOUNTER — Ambulatory Visit (INDEPENDENT_AMBULATORY_CARE_PROVIDER_SITE_OTHER): Payer: Medicare Other | Admitting: Adult Health Nurse Practitioner

## 2019-04-30 ENCOUNTER — Other Ambulatory Visit: Payer: Self-pay

## 2019-04-30 VITALS — BP 146/82 | HR 83 | Temp 97.7°F | Ht 66.0 in | Wt 210.0 lb

## 2019-04-30 DIAGNOSIS — Z6833 Body mass index (BMI) 33.0-33.9, adult: Secondary | ICD-10-CM | POA: Diagnosis not present

## 2019-04-30 DIAGNOSIS — E1122 Type 2 diabetes mellitus with diabetic chronic kidney disease: Secondary | ICD-10-CM | POA: Diagnosis not present

## 2019-04-30 DIAGNOSIS — Z794 Long term (current) use of insulin: Secondary | ICD-10-CM | POA: Diagnosis not present

## 2019-04-30 DIAGNOSIS — E11649 Type 2 diabetes mellitus with hypoglycemia without coma: Secondary | ICD-10-CM

## 2019-04-30 DIAGNOSIS — K219 Gastro-esophageal reflux disease without esophagitis: Secondary | ICD-10-CM | POA: Diagnosis not present

## 2019-04-30 DIAGNOSIS — N182 Chronic kidney disease, stage 2 (mild): Secondary | ICD-10-CM

## 2019-04-30 DIAGNOSIS — I10 Essential (primary) hypertension: Secondary | ICD-10-CM

## 2019-04-30 DIAGNOSIS — R350 Frequency of micturition: Secondary | ICD-10-CM | POA: Diagnosis not present

## 2019-04-30 DIAGNOSIS — R600 Localized edema: Secondary | ICD-10-CM | POA: Diagnosis not present

## 2019-04-30 MED ORDER — NOVOLIN 70/30 FLEXPEN RELION (70-30) 100 UNIT/ML ~~LOC~~ SUPN: PEN_INJECTOR | SUBCUTANEOUS | 11 refills | Status: DC

## 2019-04-30 MED ORDER — AMLODIPINE BESYLATE 10 MG PO TABS: ORAL_TABLET | ORAL | 1 refills | Status: DC

## 2019-04-30 NOTE — Patient Instructions (Addendum)
Amlodipine (Norvasc) 5mg , change the way you take this medication.  Take two tablets 10mg  nightly.  Once the 5mg  tablets are out we will send in a prescription for the 10mg  tablets.  Continue to check your blood pressure twice a day.  Today your blood pressure was 146/82.  Please contact us via phone or by MyChart IF your blood pressure remains high.  Top number 180 or higher and bottom number 100 or higher.   Novolin 70/30 NOW: 26 units in morning, 22 at lunch and 14 at dinner.  Continue Metformin 1,000mg  BID  Glipizide 10mg  at breakfast  And 10mg  at lunch  Continue glucose log  We will follow up in little over a week via telelphone appointment July 17.   IF you continue to have LOW blood sugars in morning or not able to read.  Please contact the office so your medication can be adjusted.

## 2019-04-30 NOTE — Progress Notes (Signed)
Follow up for Diabetes and Blood Pressure  Assessment and Plan:  Brett [name] was seen today for follow up on evaluation of BLE, Diabetes management, and frequency of micturition.    Diagnoses and all orders for this visit:   Uncontrolled type 2 diabetes mellitus with hypoglycemia without coma ([place]) No hypoglycemia noted. Will decrease evening dosing dosing at this time.   Previous regiment:-     insulin NPH-regular Human (NOVOLIN 70/30) (70-30) 100 UNIT/ML injection; Inject 26 units in the morning, 22 units at lunch and 20 at dinner.  Change the dose of this medication NOW: 26 units in morning, 22 at lunch and 14 at dinner. RX-Novolin 70/30 flex pen Teaching completed with test pen in office today. Continue Metformin 1,000mg  BID Glipizide 10mg  at breakfast  And 10mg  at lunch Continue glucose log  Bilateral lower extremity edema Improved  Lasix 40mg  daily Will check CMP today Discussed increasing activity, walk daily, DO NOT SIT IN CHAIR ALL DAY! ContinuecCompression socks Elevate legs above heart to help reduce edema Patient to weight self daily and record to help monitor symptoms.  Weight slowly decreasing with improving blood pressure and glucose. Avoid sodium Increase water intake  Uncontrolled hypertension Monitor blood pressure at home and record Taking Ziac 10-6.25mg  daily, Lasix 40mg  daily, Cozaar 100mg  daily Norvasc 5mg  nightly, chang to Norvasc 10mg  nightly Focus on dietarty changes as well.  Frequency of micturition Slight improvemnt Likely form combination of HTN uncontrolled, BLE and hyperglycemia.  Also needs to continue to increase amount of water he drinks during the day.   Controlling B/P, glucose and edema will help to reduce this  Gastroesophageal reflux disease without esophagitis taking   famotidine (PEPCID) 20 MG tablet; Take one tablet by mouth once a day.    Doing well with this  Monitor symptoms Avoid triggers and monitor diet  CKD stage 2 due to type 2  diabetes mellitus (HCC) Increase fluids, avoid NSAIDS, monitor sugars, will monitor Will check CMP today  BMI 33.0-33.9,adult, decreased from BMI of 35 Discussed dietary and exercise modifications Discussed specific dietary modifications Down 6lbs over past two months.   Follow Up Instructions: Follow up via telephone in 10days for blood pressure and glucose logs.  Parameters given to contact office.  Hospital precautions provided.  Follow up in two weeks for follow up on glucose and B/P. Discussed assessment and treatment plan with the patient. The patient was provided an opportunity to ask questions and all were answered. The patient agrees with the plan of care and demonstrates an understanding of the instructions.   The patient was advised to call back or seek an in-person evaluation if the symptoms worsen or if the condition fails to improve as anticipated.   I provided 40 minutes of face-to-face time during this encounter including interview, counseling, chart review, and critical decision making was preformed.     Future Appointments  Date Time Provider [place]  04/30/2019  9:00 AM Garnet Sierras, NP [facility] None  06/10/2019  9:30 AM [name], NP [facility] None  08/09/2019  8:00 AM [facility] 2 [facility] None  08/09/2019  8:30 AM [name], MD [facility] None  09/12/2019  9:30 AM [name], MD [facility] None  10/09/2019  8:15 AM [name], MD [facility] None  03/10/2020  9:00 AM [name], MD [facility] None    ------------------------------------------------------------------------------------------------------------------   HPI 73 y.o.male presents for follow up on evaluation of both lower extremities for edema, diabetes management and frequency of micturition and  was last two weeks ago via telephone visit with medication adjustments and continued monitoring.  Glucose monitor and log, blood pressure and weight logs  with patient today and reviewed by Probation officer.  His fasting blood glucose has decreased from 168-215 to hypoglycemia range from 60-115 with some unreadable lows.  The patient reports being asymptomatic and is unable to tell if he has high or lower glucose levels.  Afternoon range decreased from  290-360 to 150-240 and evening decreased from 270-336 to 215-208.  He has trouble drawing up the insulin in his syringe reporting difficulty seeing the numbers.  He is able to afford the insulin pens and he will be starting these tomorrow morning for the first time. He is taking metformin and maximized on this and also added glipizide 10mg  BID at breakfast and lunch which are his largest meals.  We discussed at length diabetes management, diet, checking glucose and dangers of hypoglycemia.  Importance of eating when he has the low numbers and types of foods to eat to prevent large spikes and drops.  Concerned that he is unable to tell with hypo or hyperglycemic events. He has not made any adjustment to his diet or exercise at this time.  Discussed this current management will lead him to hospitalization if not changed.  He has agreed to allow his daughter to assist with his medication management and she had accompanied him to the appointment today.  He has been checking his blood pressure twice a day and has decreased from 190-180 over 100-90's to 180-168 over 90-89.  Denies any headaches, chest pain or shortness of breath.  Last appointment amlodipine 5mg  nightly was added.  Denies any side effects to this medication.  His BLE edema has improved overall and he has been wearing OTC compression socks daily.  He reports he still has increased frequency of micturition although it has decreased.  Reports he wakes frequently at night.  Endorses some urge incontinence.  Denies any dysuria, difficulty starting or stopping stream.     Past Medical History:  Diagnosis Date  . Allergy   . Colon polyp   . Diabetes mellitus  without complication ([place])   . Dilated cardiomyopathy ([place])    non isch  . Gout   . Hemochromatosis   . Hypertension   . OSA (obstructive sleep apnea)      No Known Allergies  Current Outpatient Medications on File Prior to Visit  Medication Sig  . allopurinol (ZYLOPRIM) 300 MG tablet TAKE 1 TABLET BY MOUTH  DAILY  . amLODipine (NORVASC) 5 MG tablet Take 1 tablet (5 mg total) by mouth daily.  Marland Kitchen aspirin 81 MG tablet Take 81 mg by mouth daily.    Marland Kitchen atorvastatin (LIPITOR) 20 MG tablet TAKE 1 TABLET BY MOUTH  DAILY  . bisoprolol-hydrochlorothiazide (ZIAC) 10-6.25 MG tablet TAKE 1 TABLET BY MOUTH  DAILY  . Cholecalciferol (VITAMIN D PO) Take 5,000 Units by mouth daily.  . diphenhydramine-acetaminophen (TYLENOL PM) 25-500 MG TABS tablet Take 1 tablet by mouth at bedtime as needed.   . famotidine (PEPCID) 40 MG tablet Takes 1 tablet daily for indigestion and heart burn.  . furosemide (LASIX) 40 MG tablet TAKE 1 TABLET BY MOUTH  DAILY  . glucose blood test strip Use as instructed  . Insulin Isophane & Regular Human (NOVOLIN 70/30 FLEXPEN RELION) (70-30) 100 UNIT/ML PEN Inject 20units into subcutaneous skin 36min before breakfast, 18units at lunch and 16 units at dinner. (Patient not taking: Reported on 04/09/2019)  .  Insulin Pen Needle 31G X 8 MM MISC Use one daily [place]  . Insulin Syringe-Needle U-100 31G X 15/64" 0.5 ML MISC Use two pens daily with insulin  . Loratadine (CLARITIN PO) Take by mouth daily.    Marland Kitchen losartan (COZAAR) 100 MG tablet TAKE 1 TABLET BY MOUTH  DAILY  . magnesium oxide (MAG-OX) 400 MG tablet Take 400 mg by mouth 2 (two) times daily.   . metFORMIN (GLUCOPHAGE-XR) 500 MG 24 hr tablet TAKE 2 TABLETS BY MOUTH TWO TIMES DAILY  . minoxidil (LONITEN) 10 MG tablet Take 1 tablet Daily for BP  . montelukast (SINGULAIR) 10 MG tablet TAKE 1 TABLET BY MOUTH  EVERY NIGHT AT BEDTIME  . Polyethyl Glycol-Propyl Glycol (SYSTANE OP) Apply to eye as needed.  . tadalafil (CIALIS) 20 MG tablet  Take 1/2 to 1 tablet every 2 to 3 days as needed for XXXX (Sex)   No current facility-administered medications on file prior to visit.     ROS: Review of Systems  Constitutional: Negative for chills, diaphoresis, fever, malaise/fatigue and weight loss.  HENT: Negative for congestion, ear discharge, ear pain, hearing loss, nosebleeds, sinus pain, sore throat and tinnitus.   Eyes: Negative for blurred vision, double vision, photophobia, pain, discharge and redness.  Respiratory: Negative for cough, hemoptysis, sputum production, shortness of breath, wheezing and stridor.   Cardiovascular: Positive for leg swelling. Negative for chest pain, palpitations, orthopnea, claudication and PND.  Gastrointestinal: Negative for abdominal pain, blood in stool, constipation, diarrhea, heartburn, melena, nausea and vomiting.  Genitourinary: Positive for frequency. Negative for dysuria, flank pain, hematuria and urgency.  Musculoskeletal: Negative for back pain, falls, joint pain, myalgias and neck pain.  Skin: Negative for itching and rash.    Physical Exam:  There were no vitals taken for this visit.  General Appearance: Well nourished, in no apparent distress. Eyes: PERRLA, EOMs, conjunctiva no swelling or erythema Sinuses: No Frontal/maxillary tenderness ENT/Mouth: Ext aud canals clear, TMs without erythema, bulging. No erythema, swelling, or exudate on post pharynx.  Tonsils not swollen or erythematous. Hearing normal.  Neck: Supple, thyroid normal.  Respiratory: Respiratory effort normal, BS equal bilaterally without rales, rhonchi, wheezing or stridor.  Cardio: RRR with no MRGs. Brisk peripheral pulses, +1 edema, wearing compression socks. Abdomen: Soft obese, + BS.  Non tender, no guarding, rebound, hernias, masses. Lymphatics: Non tender without lymphadenopathy.  Musculoskeletal: Full ROM, 5/5 strength, normal gait.  Skin: Warm, dry without rashes, lesions, ecchymosis.  Neuro: Cranial nerves  intact. Normal muscle tone, no cerebellar symptoms. Sensation intact.  Psych: Awake and oriented X 3, normal affect, Insight and Judgment appropriate.     Garnet Sierras, NP 9:30 AM [facility]

## 2019-05-01 LAB — COMPLETE METABOLIC PANEL WITH GFR
AG Ratio: 2.2 (calc) (ref 1.0–2.5)
ALT: 22 U/L (ref 9–46)
AST: 13 U/L (ref 10–35)
Albumin: 4.7 g/dL (ref 3.6–5.1)
Alkaline phosphatase (APISO): 37 U/L (ref 35–144)
BUN: 21 mg/dL (ref 7–25)
CO2: 27 mmol/L (ref 20–32)
Calcium: 10.2 mg/dL (ref 8.6–10.3)
Chloride: 103 mmol/L (ref 98–110)
Creat: 0.95 mg/dL (ref 0.70–1.18)
GFR, Est African American: 92 mL/min/{1.73_m2} (ref >=60)
GFR, Est Non African American: 80 mL/min/{1.73_m2} (ref >=60)
Globulin: 2.1 g/dL (calc) (ref 1.9–3.7)
Glucose, Bld: 157 mg/dL — ABNORMAL HIGH (ref 65–99)
Potassium: 3.8 mmol/L (ref 3.5–5.3)
Sodium: 141 mmol/L (ref 135–146)
Total Bilirubin: 0.9 mg/dL (ref 0.2–1.2)
Total Protein: 6.8 g/dL (ref 6.1–8.1)

## 2019-05-05 ENCOUNTER — Other Ambulatory Visit: Payer: Self-pay | Admitting: Internal Medicine

## 2019-05-10 ENCOUNTER — Ambulatory Visit: Payer: Medicare Other | Admitting: Adult Health Nurse Practitioner

## 2019-05-10 ENCOUNTER — Other Ambulatory Visit: Payer: Self-pay

## 2019-05-10 VITALS — BP 166/89 | HR 69 | Temp 98.2°F | Wt 210.8 lb

## 2019-05-10 DIAGNOSIS — E11649 Type 2 diabetes mellitus with hypoglycemia without coma: Secondary | ICD-10-CM

## 2019-05-10 DIAGNOSIS — I10 Essential (primary) hypertension: Secondary | ICD-10-CM | POA: Diagnosis not present

## 2019-05-10 MED ORDER — NOVOLIN 70/30 FLEXPEN RELION (70-30) 100 UNIT/ML ~~LOC~~ SUPN: PEN_INJECTOR | SUBCUTANEOUS | 4 refills | Status: DC

## 2019-05-10 MED ORDER — OLMESARTAN MEDOXOMIL 40 MG PO TABS: 40.0000 mg | ORAL_TABLET | Freq: Every day | ORAL | 1 refills | Status: DC

## 2019-05-10 NOTE — Patient Instructions (Addendum)
  Today you had a telephone visit with Garnet Sierras, DNP.  Below is a summary of your visit.  We have sent in a prescription for your insulin, 90day supply.  This should be 3 boxes, 15 pens.  We have changed how much insulin you give yourself with each meal.  Novolin 70/30 flex pen NOW: 30 units in morning, 24 at lunch and 16 at dinner. Continue Metformin 1,000mg  BID Glipizide 10mg  at breakfast  And 10mg  at lunch Continue glucose log    We are changing your blood pressure medications STOP taking Cozaar 100mg  We are going to start Olmesartan Benicar (40mg )  Continue Amlodipine (Norvasc) 10mg  at night  Continue to check your blood pressure twice a day.  Today your blood pressure was 166/89.   Goal for your blood pressure is 140/90 or less.  After two weeks if blood pressure remains 160's / 90's please send a MyChart message to let us know OR if your blood pressure gets too low 100's/60's and or having any symptoms, tired, dizzy weakness, feel bad.  You will follow up in one month in the office and we will check labs at this time.  Let me know if you have any questions or concerns.  Sincerely,        Garnet Sierras, NP

## 2019-05-10 NOTE — [place] Notes (Signed)
Follow up for Diabetes and Blood Pressure  Assessment and Plan:  Brett [name] was seen today for follow up on evaluation of Diabetes management and blood pressure.  Diagnoses and all orders for this visit:   Uncontrolled type 2 diabetes mellitus with hypoglycemia without coma ([place]) No hypoglycemia noted. Will decrease evening dosing dosing at this time.   Previous regiment:-     insulin NPH-regular Human (NOVOLIN 70/30) (70-30) 100 UNIT/ML injection; Inject 26 units in the morning, 22 units at lunch and 20 at dinner.  Change the dose of this medication NOW: 26 units in morning, 22 at lunch and 14 at dinner. RX-Novolin 70/30 flex pen Teaching completed with test pen in office today. Continue Metformin 1,000mg  BID Glipizide 10mg  at breakfast  And 10mg  at lunch Continue glucose log   Bilateral lower extremity edema Improved  Lasix 40mg  daily Will check CMP today Discussed increasing activity, walk daily, DO NOT SIT IN CHAIR ALL DAY! ContinuecCompression socks Elevate legs above heart to help reduce edema Patient to weight self daily and record to help monitor symptoms.  Weight slowly decreasing with improving blood pressure and glucose. Avoid sodium Increase water intake  Uncontrolled hypertension Monitor blood pressure at home and record Taking Ziac 10-6.25mg  daily, Lasix 40mg  daily, Cozaar 100mg  daily Norvasc 5mg  nightly, chang to Norvasc 10mg  nightly Focus on dietarty changes as well.  Frequency of micturition Slight improvemnt Likely form combination of HTN uncontrolled, BLE and hyperglycemia.  Also needs to continue to increase amount of water he drinks during the day.   Controlling B/P, glucose and edema will help to reduce this  Gastroesophageal reflux disease without esophagitis taking   famotidine (PEPCID) 20 MG tablet; Take one tablet by mouth once a day.    Doing well with this  Monitor symptoms Avoid triggers and monitor diet  CKD stage 2 due to type 2 diabetes mellitus  (HCC) Increase fluids, avoid NSAIDS, monitor sugars, will monitor Will check CMP today  BMI 33.0-33.9,adult, decreased from BMI of 35 Discussed dietary and exercise modifications Discussed specific dietary modifications Down 6lbs over past two months.   Follow Up Instructions:  Follow up in two weeks for follow up on glucose and B/P. Discussed assessment and treatment plan with the patient. The patient was provided an opportunity to ask questions and all were answered. The patient agrees with the plan of care and demonstrates an understanding of the instructions.   The patient was advised to call back or seek an in-person evaluation if the symptoms worsen or if the condition fails to improve as anticipated.   I provided 30 minutes of non face-to-face time during this encounter including interview, counseling, chart review, and critical decision making was preformed.     Future Appointments  Date Time Provider [place]  06/10/2019  9:30 AM [name], NP [facility] None  08/09/2019  8:00 AM [facility] LAB 2 [facility] None  08/09/2019  8:30 AM [name], MD [facility] None  09/12/2019  9:30 AM [name], MD [facility] None  10/09/2019  8:15 AM [name], MD [facility] None  03/10/2020  9:00 AM [name], MD [facility] None    ------------------------------------------------------------------------------------------------------------------   HPI 73 y.o.male presents for follow up on evaluation diabetes management and elevated blood pressure.  His daughter Brett [name] is on speaker phone with the patient present.  Last visit  was 10 days ago with a face to face office visit with medication adjustments and continued monitoring.  Glucose monitor readings log, blood pressure and  weight logs reviewed with patient today.  His fasting blood glucose has increased from hypoglycemic last visit and now ranging 114-296.  Afternoon range decreased from   168-360  evening decreased from 215-208 range to 109-291.  He has been using the insulin pens without difficulties and reports this is much easier to see what dose he is giving himself.   . He still has not made any adjustment to his diet or exercise at this time. Daughter reports every once in awhile he will make better food choices.  Discussed diet at length and benefits of this with his glucose control and overall health.    He has been checking his blood pressure twice a day and has decreased from 180-168 over 100-90's to 170-10's8 over 90-85.  Denies any headaches, chest pain or shortness of breath.  Last appointment amlodipine 5mg  nightly was added.  Denies any side effects to this medication.  His BLE edema has improved overall and he has been wearing OTC compression socks daily as well as daily weight.  His weight has dropped and he continues to monitor this.  He reports he still has increased frequency of micturition although it has decreased even further from last office visit.  Again dicussed importance of glucose control can help to decrease this. Endorses some urge incontinence.  Denies any dysuria, difficulty starting or stopping stream.     Past Medical History:  Diagnosis Date  . Allergy   . Colon polyp   . Diabetes mellitus without complication (Brett [name])   . Dilated cardiomyopathy ([place])    non isch  . Gout   . Hemochromatosis   . Hypertension   . OSA (obstructive sleep apnea)      No Known Allergies  Current Outpatient Medications on File Prior to Visit  Medication Sig  . allopurinol (ZYLOPRIM) 300 MG tablet TAKE 1 TABLET BY MOUTH  DAILY  . amLODipine (NORVASC) 10 MG tablet Take one tablet by mouth every night for blood pressure.  Marland Kitchen aspirin 81 MG tablet Take 81 mg by mouth daily.    Marland Kitchen atorvastatin (LIPITOR) 20 MG tablet TAKE 1 TABLET BY MOUTH  DAILY  . bisoprolol-hydrochlorothiazide (ZIAC) 10-6.25 MG tablet TAKE 1 TABLET BY MOUTH  DAILY  . Cholecalciferol (VITAMIN D PO)  Take 5,000 Units by mouth daily.  . diphenhydramine-acetaminophen (TYLENOL PM) 25-500 MG TABS tablet Take 1 tablet by mouth at bedtime as needed.   . famotidine (PEPCID) 40 MG tablet Takes 1 tablet daily for indigestion and heart burn.  . furosemide (LASIX) 40 MG tablet TAKE 1 TABLET BY MOUTH  DAILY  . glipiZIDE (GLUCOTROL) 10 MG tablet 10 mg. 10 in the AM   10 at lunch time  . glucose blood test strip Use as instructed  . Insulin Pen Needle 31G X 8 MM MISC Use one daily [place]  . Loratadine (CLARITIN PO) Take by mouth daily.    Marland Kitchen losartan (COZAAR) 100 MG tablet TAKE 1 TABLET BY MOUTH  DAILY  . magnesium oxide (MAG-OX) 400 MG tablet Take 400 mg by mouth 2 (two) times daily.   . metFORMIN (GLUCOPHAGE-XR) 500 MG 24 hr tablet TAKE 2 TABLETS BY MOUTH TWO TIMES DAILY  . minoxidil (LONITEN) 10 MG tablet Take 1 tablet Daily for BP  . montelukast (SINGULAIR) 10 MG tablet TAKE 1 TABLET BY MOUTH  EVERY NIGHT AT BEDTIME  . Polyethyl Glycol-Propyl Glycol (SYSTANE OP) Apply to eye as needed.  . tadalafil (CIALIS) 20 MG tablet Take 1/2 to 1 tablet  every 2 to 3 days as needed for XXXX (Sex)   No current facility-administered medications on file prior to visit.     ROS: Review of Systems  Constitutional: Negative for chills, diaphoresis, fever, malaise/fatigue and weight loss.  HENT: Negative for congestion, ear discharge, ear pain, hearing loss, nosebleeds, sinus pain, sore throat and tinnitus.   Eyes: Negative for blurred vision, double vision, photophobia, pain, discharge and redness.  Respiratory: Negative for cough, hemoptysis, sputum production, shortness of breath, wheezing and stridor.   Cardiovascular: Negative for chest pain, palpitations, orthopnea, claudication, leg swelling and PND.  Gastrointestinal: Negative for abdominal pain, blood in stool, constipation, diarrhea, heartburn, melena, nausea and vomiting.  Genitourinary: Positive for frequency. Negative for dysuria, flank pain, hematuria and  urgency.  Musculoskeletal: Negative for back pain, falls, joint pain, myalgias and neck pain.  Skin: Negative for itching and rash.    Physical Exam:  BP (!) 166/89   Pulse 69   Temp 98.2 F (36.8 C)   Wt 210 lb 12.8 oz (95.6 kg)   BMI 34.02 kg/m  General : Well sounding patient in no apparent distress HEENT: no hoarseness, no cough for duration of visit Lungs: speaks in complete sentences, no audible wheezing, no apparent distress Neurological: alert, oriented x 3 Psychiatric: pleasant, judgement appropriate   Garnet Sierras, NP 11:30 AM [facility]

## 2019-05-16 ENCOUNTER — Encounter: Payer: Self-pay | Admitting: Adult Health Nurse Practitioner

## 2019-06-06 NOTE — [place] Notes (Signed)
FOLLOW UP  Assessment and Plan:   Dilated cardiomyopathy Weights stable; monitor   Hypertension Well controlled with current medications, increasing lasix for edema Monitor blood pressure at home; patient to call if consistently greater than 130/80 Continue DASH diet.   Reminder to go to the ER if any CP, SOB, nausea, dizziness, severe HA, changes vision/speech, left arm numbness and tingling and jaw pain.  Cholesterol Currently at goal; continue atorvastatin 20 mg daily Continue low cholesterol diet and exercise.  Check lipid panel.   Diabetes with diabetic chronic kidney disease Continue medication: metformin, novolin 70/30 30 units AM, 24 units at lunch, hasn't increased from 14 to 16 units PM - encouraged to increase as previously advised Lifestyle and instructions reviewed with patient and daughter at length Given new glucose log with instructions to check sugars PRIOR to meals, keep meal log to help evaluate what choices contributing to severely labile values Goal fasting <150 consistently, <180 prior to lunch/dinner If A1C poorly controlled plan to follow up in 1 month with Kyra for ongoing close diabetes management Continue diet and exercise - NEEDS TO START WALKING Perform daily foot/skin check, notify office of any concerning changes.  Check A1C  Obesity with co morbidities Long discussion about weight loss, diet, and exercise Recommended diet heavy in fruits and veggies and low in animal meats, cheeses, and dairy products, appropriate calorie intake Discussed ideal weight for height  Emphasized needs to start walking 30 min daily, adjusting diet for glucose control  Will follow up in 3 months  Gout Continue allopurinol Diet discussed Check uric acid as needed  Vitamin D Def At goal at last visit; continue supplementation to maintain goal of 70-100 Defer Vit D level  Peripheral edema Improved with lasix 40 mg and daily compression hose - elevate legs TID,  increase activity, increase water, decrease sodium intake.   Continue diet and meds as discussed. Further disposition pending results of labs. Discussed med's effects and SE's.   Over 30 minutes of exam, counseling, chart review, and critical decision making was performed.   Future Appointments  Date Time Provider [place]  08/09/2019  8:00 AM [facility] 2 [facility] None  08/09/2019  8:30 AM [name], MD [facility] None  09/12/2019  9:30 AM [name], MD [facility] None  10/09/2019  8:15 AM [name], MD [facility] None  03/10/2020  9:00 AM [name], MD [facility] None    ----------------------------------------------------------------------------------------------------------------------  HPI 73 y.o. male  presents for 3 month follow up on hypertension, cholesterol, diabetes, obesity and vitamin D deficiency.   His daughter accompanies him today.   he has a diagnosis of GERD which is currently managed by famotidine 40 mg daily he reports symptoms is currently well controlled, and denies breakthrough reflux, burning in chest, hoarseness or cough.    BMI is Body mass index is 33.89 kg/m., he admits exercise and diet hasn't been great, he admits he has not been exercising much due to heat and poor motivation. He is living with his granddaughter, she has been cooking, trying to help make better choices but he still "eats what he wants"- [place] diet with lots of breads and potatoes.  Discussed diet at length and benefits of this with his glucose control and overall health.  Patient is quite ambivalent about change. He was previously very active and has reported walked ~4 miles daily, stopped "because I'm old."     Wt Readings from Last 3 Encounters:  06/10/19 210 lb (95.3 kg)  05/10/19 210 lb 12.8 oz (95.6 kg)  04/30/19 210 lb (95.3 kg)   Edema is significantly improved with addition of lasix 40 mg daily and wearing daily compression His blood  pressure has been controlled at home, today their BP is BP: (!) 144/78  He does not workout. He denies chest pain, shortness of breath, dizziness.   He is on cholesterol medication (atorvastatin 20 mg every other day) and denies myalgias. His cholesterol is at goal. The cholesterol last visit was:   Lab Results  Component Value Date   CHOL 112 03/07/2019   HDL 38 (L) 03/07/2019   LDLCALC 51 03/07/2019   TRIG 150 (H) 03/07/2019   CHOLHDL 2.9 03/07/2019    He has been working on diet for T2 diabetes (on metformin and novolin 70/30 - doing better since switch to pen, 30 units AM, may take 24 units at noon, and takes another 14 units PM- he was supposed to increase to 16 units but didn't do so), and denies foot ulcerations, increased appetite, nausea, paresthesia of the feet, polydipsia, polyuria, visual disturbances, vomiting and weight loss. He presents with log of highly labile values; daughter is concerned as apparently he will check some sugars after eating rather than fasting/prior to meal which limits our ability to interpret accurately.  He denies any hypoglycemic episodes/symptoms. Fasting has ranged 82-435 (typically 130s-180s with rare outliers); lunch -180s-mid 200s, rare up to 300; PM 200-300 - Last A1C in the office was:  Lab Results  Component Value Date   HGBA1C 7.8 (H) 03/07/2019   Patient is on Vitamin D supplement.   Lab Results  Component Value Date   VD25OH 100 03/07/2019     Patient is on allopurinol for gout and does not report a recent flare.  Lab Results  Component Value Date   LABURIC 6.1 03/07/2019      Current Medications:  Current Outpatient Medications on File Prior to Visit  Medication Sig  . allopurinol (ZYLOPRIM) 300 MG tablet TAKE 1 TABLET BY MOUTH  DAILY  . amLODipine (NORVASC) 10 MG tablet Take one tablet by mouth every night for blood pressure.  Marland Kitchen aspirin 81 MG tablet Take 81 mg by mouth daily.    Marland Kitchen atorvastatin (LIPITOR) 20 MG tablet TAKE 1 TABLET  BY MOUTH  DAILY  . bisoprolol-hydrochlorothiazide (ZIAC) 10-6.25 MG tablet TAKE 1 TABLET BY MOUTH  DAILY  . Cholecalciferol (VITAMIN D PO) Take 5,000 Units by mouth daily.  . diphenhydramine-acetaminophen (TYLENOL PM) 25-500 MG TABS tablet Take 1 tablet by mouth at bedtime as needed.   . famotidine (PEPCID) 40 MG tablet Takes 1 tablet daily for indigestion and heart burn.  . furosemide (LASIX) 40 MG tablet TAKE 1 TABLET BY MOUTH  DAILY  . glipiZIDE (GLUCOTROL) 10 MG tablet 10 mg. 10 in the AM   10 at lunch time  . glucose blood test strip Use as instructed  . Insulin Isophane & Regular Human (NOVOLIN 70/30 FLEXPEN RELION) (70-30) 100 UNIT/ML PEN Inject into subcutaneous skin 19min before meals, 30units breakfast, 24units lunch and 16units dinner.  . Insulin Pen Needle 31G X 8 MM MISC Use one daily [place]  . Loratadine (CLARITIN PO) Take by mouth daily.    Marland Kitchen losartan (COZAAR) 100 MG tablet TAKE 1 TABLET BY MOUTH  DAILY  . magnesium oxide (MAG-OX) 400 MG tablet Take 400 mg by mouth 2 (two) times daily.   . metFORMIN (GLUCOPHAGE-XR) 500 MG 24 hr tablet TAKE 2 TABLETS BY MOUTH TWO TIMES  DAILY  . minoxidil (LONITEN) 10 MG tablet Take 1 tablet Daily for BP  . montelukast (SINGULAIR) 10 MG tablet TAKE 1 TABLET BY MOUTH  EVERY NIGHT AT BEDTIME  . olmesartan (BENICAR) 40 MG tablet Take 1 tablet (40 mg total) by mouth daily.  . tadalafil (CIALIS) 20 MG tablet Take 1/2 to 1 tablet every 2 to 3 days as needed for XXXX (Sex)  . Polyethyl Glycol-Propyl Glycol (SYSTANE OP) Apply to eye as needed.   No current facility-administered medications on file prior to visit.      Allergies: No Known Allergies   Medical History:  Past Medical History:  Diagnosis Date  . Allergy   . Colon polyp   . Diabetes mellitus without complication ([place])   . Dilated cardiomyopathy ([place])    non isch  . Gout   . Hemochromatosis   . Hypertension   . OSA (obstructive sleep apnea)    Family history- Reviewed and  unchanged Social history- Reviewed and unchanged   Review of Systems:  Review of Systems  Constitutional: Negative for malaise/fatigue and weight loss.  HENT: Negative for hearing loss and tinnitus.   Eyes: Negative for blurred vision and double vision.  Respiratory: Negative for cough, shortness of breath and wheezing.   Cardiovascular: Negative for chest pain, palpitations, orthopnea, claudication and leg swelling.  Gastrointestinal: Negative for abdominal pain, blood in stool, constipation, diarrhea, heartburn, melena, nausea and vomiting.  Genitourinary: Negative.   Musculoskeletal: Negative for joint pain and myalgias.  Skin: Negative for rash.  Neurological: Negative for dizziness, tingling, sensory change, weakness and headaches.  Endo/Heme/Allergies: Negative for polydipsia.  Psychiatric/Behavioral: Negative.   All other systems reviewed and are negative.     Physical Exam: BP (!) 144/78   Pulse 60   Temp (!) 97.2 F (36.2 C)   Ht 5\' 6"  (1.676 m)   Wt 210 lb (95.3 kg)   SpO2 98%   BMI 33.89 kg/m  Wt Readings from Last 3 Encounters:  06/10/19 210 lb (95.3 kg)  05/10/19 210 lb 12.8 oz (95.6 kg)  04/30/19 210 lb (95.3 kg)   General Appearance: Well nourished, in no apparent distress. Eyes: PERRLA, EOMs, conjunctiva no swelling or erythema Sinuses: No Frontal/maxillary tenderness ENT/Mouth: Ext aud canals clear, TMs without erythema, bulging. No erythema, swelling, or exudate on post pharynx.  Tonsils not swollen or erythematous. Hearing normal.  Neck: Supple, thyroid normal.  Respiratory: Respiratory effort normal, BS equal bilaterally without rales, rhonchi, wheezing or stridor.  Cardio: RRR with no MRGs. Brisk peripheral pulses without edema; bil compression hose in place. Abdomen: Soft, + BS.  Non tender, no guarding, rebound, hernias, masses. Lymphatics: Non tender without lymphadenopathy.  Musculoskeletal: Full ROM, 5/5 strength, Normal gait Skin: Warm, dry  without rashes, lesions, ecchymosis.  Neuro: Cranial nerves intact. No cerebellar symptoms.  Psych: Awake and oriented X 3, normal affect, Insight and Judgment appropriate.    Izora Ribas, NP 9:43 AM [facility]

## 2019-06-10 ENCOUNTER — Encounter: Payer: Self-pay | Admitting: Adult Health

## 2019-06-10 ENCOUNTER — Other Ambulatory Visit: Payer: Self-pay

## 2019-06-10 ENCOUNTER — Ambulatory Visit (INDEPENDENT_AMBULATORY_CARE_PROVIDER_SITE_OTHER): Payer: Medicare Other | Admitting: Adult Health

## 2019-06-10 VITALS — BP 144/78 | HR 60 | Temp 97.2°F | Ht 66.0 in | Wt 210.0 lb

## 2019-06-10 DIAGNOSIS — I10 Essential (primary) hypertension: Secondary | ICD-10-CM | POA: Diagnosis not present

## 2019-06-10 DIAGNOSIS — E1122 Type 2 diabetes mellitus with diabetic chronic kidney disease: Secondary | ICD-10-CM | POA: Diagnosis not present

## 2019-06-10 DIAGNOSIS — E785 Hyperlipidemia, unspecified: Secondary | ICD-10-CM

## 2019-06-10 DIAGNOSIS — N182 Chronic kidney disease, stage 2 (mild): Secondary | ICD-10-CM | POA: Diagnosis not present

## 2019-06-10 DIAGNOSIS — E1169 Type 2 diabetes mellitus with other specified complication: Secondary | ICD-10-CM | POA: Diagnosis not present

## 2019-06-10 DIAGNOSIS — E559 Vitamin D deficiency, unspecified: Secondary | ICD-10-CM | POA: Diagnosis not present

## 2019-06-10 DIAGNOSIS — M1000 Idiopathic gout, unspecified site: Secondary | ICD-10-CM | POA: Diagnosis not present

## 2019-06-10 DIAGNOSIS — Z794 Long term (current) use of insulin: Secondary | ICD-10-CM | POA: Diagnosis not present

## 2019-06-10 DIAGNOSIS — Z79899 Other long term (current) drug therapy: Secondary | ICD-10-CM | POA: Diagnosis not present

## 2019-06-10 DIAGNOSIS — E669 Obesity, unspecified: Secondary | ICD-10-CM | POA: Diagnosis not present

## 2019-06-10 DIAGNOSIS — I420 Dilated cardiomyopathy: Secondary | ICD-10-CM | POA: Diagnosis not present

## 2019-06-10 NOTE — Patient Instructions (Addendum)
Goals    . Exercise 150 min/wk Moderate Activity     Increase activity, walking is great for your heart health and will help with your weight.  Involve your friends or family.           Bring your blood pressure cuff with you to the next appointment   Remember - 30 units with breakfast, 24 units with lung, 16 units with Dinner  DON'T take insulin if not eating   ALWAYS check before eating - if you forget and check afterwards, mark it so we know it was after   Goal glucose - 100-130 fasting (before breakfast), and <180 prior to meals and 2+ hours after meals  Keep log on new sheets provided - please pay attention to what food items seem to be causing higher sugars, and try to reduce these  Please start walking - at minimum 15-20 min daily brisk walking - this will help bring down high sugar   HYPERTENSION INFORMATION  Monitor your blood pressure at home, please keep a record and bring that in with you to your next office visit.   Go to the ER if any CP, SOB, nausea, dizziness, severe HA, changes vision/speech  Testing/Procedures: HOW TO TAKE YOUR BLOOD PRESSURE:  Rest 5 minutes before taking your blood pressure.  Don't smoke or drink caffeinated beverages for at least 30 minutes before.  Take your blood pressure before (not after) you eat.  Sit comfortably with your back supported and both feet on the floor (don't cross your legs).   Elevate your arm to heart level on a table or a desk.  Use the proper sized cuff. It should fit smoothly and snugly around your bare upper arm. There should be enough room to slip a fingertip under the cuff. The bottom edge of the cuff should be 1 inch above the crease of the elbow.  Due to a recent study, SPRINT, we have changed our goal for the systolic or top blood pressure number. Ideally we want your top number at 120.  In the [facility] Trial, 5000 people were randomized to a goal BP of 120 and 5000 people were randomized to a goal BP  of less than 140. The patients with the goal BP at 120 had LESS DEMENTIA, LESS HEART ATTACKS, AND LESS STROKES, AS WELL AS OVERALL DECREASED MORTALITY OR DEATH RATE.   There was another study that showed taking your blood pressure medications at night decrease cardiovascular events.  However if you are on a fluid pill, please take this in the morning.   If you are willing, our goal BP is the top number of 120.  Your most recent BP: BP: (!) 144/78   Take your medications faithfully as instructed. Maintain a healthy weight. Get at least 150 minutes of aerobic exercise per week. Minimize salt intake. Minimize alcohol intake  DASH Eating Plan DASH stands for "Dietary Approaches to Stop Hypertension." The DASH eating plan is a healthy eating plan that has been shown to reduce high blood pressure (hypertension). Additional health benefits may include reducing the risk of type 2 diabetes mellitus, heart disease, and stroke. The DASH eating plan may also help with weight loss. WHAT DO I NEED TO KNOW ABOUT THE DASH EATING PLAN? For the DASH eating plan, you will follow these general guidelines:  Choose foods with a percent daily value for sodium of less than 5% (as listed on the food label).  Use salt-free seasonings or herbs instead of table salt  or sea salt.  Check with your health care provider or pharmacist before using salt substitutes.  Eat lower-sodium products, often labeled as "lower sodium" or "no salt added."  Eat fresh foods.  Eat more vegetables, fruits, and low-fat dairy products.  Choose whole grains. Look for the word "whole" as the first word in the ingredient list.  Choose fish and skinless chicken or [place] more often than red meat. Limit fish, poultry, and meat to 6 oz (170 g) each day.  Limit sweets, desserts, sugars, and sugary drinks.  Choose heart-healthy fats.  Limit cheese to 1 oz (28 g) per day.  Eat more home-cooked food and less restaurant, buffet, and  fast food.  Limit fried foods.  Cook foods using methods other than frying.  Limit canned vegetables. If you do use them, rinse them well to decrease the sodium.  When eating at a restaurant, ask that your food be prepared with less salt, or no salt if possible. WHAT FOODS CAN I EAT? Seek help from a dietitian for individual calorie needs. Grains Whole grain or whole wheat bread. Brown rice. Whole grain or whole wheat pasta. Quinoa, bulgur, and whole grain cereals. Low-sodium cereals. Corn or whole wheat flour tortillas. Whole grain cornbread. Whole grain crackers. Low-sodium crackers. Vegetables Fresh or frozen vegetables (raw, steamed, roasted, or grilled). Low-sodium or reduced-sodium tomato and vegetable juices. Low-sodium or reduced-sodium tomato sauce and paste. Low-sodium or reduced-sodium canned vegetables.  Fruits All fresh, canned (in natural juice), or frozen fruits. Meat and Other Protein Products Ground beef (85% or leaner), grass-fed beef, or beef trimmed of fat. Skinless chicken or [place]. Ground chicken or [place]. Pork trimmed of fat. All fish and seafood. Eggs. Dried beans, peas, or lentils. Unsalted nuts and seeds. Unsalted canned beans. Dairy Low-fat dairy products, such as skim or 1% milk, 2% or reduced-fat cheeses, low-fat ricotta or cottage cheese, or plain low-fat yogurt. Low-sodium or reduced-sodium cheeses. Fats and Oils Tub margarines without trans fats. Light or reduced-fat mayonnaise and salad dressings (reduced sodium). Avocado. Safflower, olive, or canola oils. Natural peanut or almond butter. Other Unsalted popcorn and pretzels. The items listed above may not be a complete list of recommended foods or beverages. Contact your dietitian for more options. WHAT FOODS ARE NOT RECOMMENDED? Grains White bread. White pasta. White rice. Refined cornbread. Bagels and croissants. Crackers that contain trans fat. Vegetables Creamed or fried vegetables. Vegetables in a  cheese sauce. Regular canned vegetables. Regular canned tomato sauce and paste. Regular tomato and vegetable juices. Fruits Dried fruits. Canned fruit in light or heavy syrup. Fruit juice. Meat and Other Protein Products Fatty cuts of meat. Ribs, chicken wings, bacon, sausage, bologna, salami, chitterlings, fatback, hot dogs, bratwurst, and packaged luncheon meats. Salted nuts and seeds. Canned beans with salt. Dairy Whole or 2% milk, cream, half-and-half, and cream cheese. Whole-fat or sweetened yogurt. Full-fat cheeses or blue cheese. Nondairy creamers and whipped toppings. Processed cheese, cheese spreads, or cheese curds. Condiments Onion and garlic salt, seasoned salt, table salt, and sea salt. Canned and packaged gravies. Worcestershire sauce. Tartar sauce. Barbecue sauce. Teriyaki sauce. Soy sauce, including reduced sodium. Steak sauce. Fish sauce. Oyster sauce. Cocktail sauce. Horseradish. Ketchup and mustard. Meat flavorings and tenderizers. Bouillon cubes. Hot sauce. Tabasco sauce. Marinades. Taco seasonings. Relishes. Fats and Oils Butter, stick margarine, lard, shortening, ghee, and bacon fat. Coconut, palm kernel, or palm oils. Regular salad dressings. Other Pickles and olives. Salted popcorn and pretzels. The items listed above may not be a  complete list of foods and beverages to avoid. Contact your dietitian for more information. WHERE CAN I FIND MORE INFORMATION? National Heart, Lung, and Blood Institute: travelstabloid.com Document Released: 09/29/2011 Document Revised: 02/24/2014 Document Reviewed: 08/14/2013 [facility] Patient Information 2015 [place], [place]. This information is not intended to replace advice given to you by your health care provider. Make sure you discuss any questions you have with your health care provider.

## 2019-06-11 ENCOUNTER — Encounter: Payer: Self-pay | Admitting: Internal Medicine

## 2019-06-11 LAB — COMPLETE METABOLIC PANEL WITH GFR
AG Ratio: 2.2 (calc) (ref 1.0–2.5)
ALT: 24 U/L (ref 9–46)
AST: 15 U/L (ref 10–35)
Albumin: 4.7 g/dL (ref 3.6–5.1)
Alkaline phosphatase (APISO): 36 U/L (ref 35–144)
BUN: 22 mg/dL (ref 7–25)
CO2: 29 mmol/L (ref 20–32)
Calcium: 9.9 mg/dL (ref 8.6–10.3)
Chloride: 103 mmol/L (ref 98–110)
Creat: 1.04 mg/dL (ref 0.70–1.18)
GFR, Est African American: 83 mL/min/{1.73_m2} (ref >=60)
GFR, Est Non African American: 71 mL/min/{1.73_m2} (ref >=60)
Globulin: 2.1 g/dL (calc) (ref 1.9–3.7)
Glucose, Bld: 164 mg/dL — ABNORMAL HIGH (ref 65–99)
Potassium: 3.8 mmol/L (ref 3.5–5.3)
Sodium: 142 mmol/L (ref 135–146)
Total Bilirubin: 0.6 mg/dL (ref 0.2–1.2)
Total Protein: 6.8 g/dL (ref 6.1–8.1)

## 2019-06-11 LAB — CBC WITH DIFFERENTIAL/PLATELET
Absolute Monocytes: 576 cells/uL (ref 200–950)
Basophils Absolute: 43 cells/uL (ref 0–200)
Basophils Relative: 0.6 %
Eosinophils Absolute: 151 cells/uL (ref 15–500)
Eosinophils Relative: 2.1 %
HCT: 40.6 % (ref 38.5–50.0)
Hemoglobin: 14.5 g/dL (ref 13.2–17.1)
Lymphs Abs: 1548 cells/uL (ref 850–3900)
MCH: 32.5 pg (ref 27.0–33.0)
MCHC: 35.7 g/dL (ref 32.0–36.0)
MCV: 91 fL (ref 80.0–100.0)
MPV: 9.6 fL (ref 7.5–12.5)
Monocytes Relative: 8 %
Neutro Abs: 4882 cells/uL (ref 1500–7800)
Neutrophils Relative %: 67.8 %
Platelets: 228 10*3/uL (ref 140–400)
RBC: 4.46 10*6/uL (ref 4.20–5.80)
RDW: 13.1 % (ref 11.0–15.0)
Total Lymphocyte: 21.5 %
WBC: 7.2 10*3/uL (ref 3.8–10.8)

## 2019-06-11 LAB — LIPID PANEL
Cholesterol: 122 mg/dL (ref <200)
HDL: 38 mg/dL — ABNORMAL LOW (ref >=40)
LDL Cholesterol (Calc): 61 mg/dL (calc)
Non-HDL Cholesterol (Calc): 84 mg/dL (calc) (ref <130)
Total CHOL/HDL Ratio: 3.2 (calc) (ref <5.0)
Triglycerides: 151 mg/dL — ABNORMAL HIGH (ref <150)

## 2019-06-11 LAB — HEMOGLOBIN A1C
Hgb A1c MFr Bld: 7.8 % of total Hgb — ABNORMAL HIGH (ref <5.7)
Mean Plasma Glucose: 177 (calc)
eAG (mmol/L): 9.8 (calc)

## 2019-06-11 LAB — MAGNESIUM: Magnesium: 2 mg/dL (ref 1.5–2.5)

## 2019-06-11 LAB — TSH: TSH: 3.92 mIU/L (ref 0.40–4.50)

## 2019-06-15 ENCOUNTER — Other Ambulatory Visit: Payer: Self-pay | Admitting: Adult Health Nurse Practitioner

## 2019-06-15 DIAGNOSIS — I10 Essential (primary) hypertension: Secondary | ICD-10-CM

## 2019-07-15 NOTE — Progress Notes (Signed)
FOLLOW UP  Assessment and Plan:   Brett [name] was seen today for follow-up.  Diagnoses and all orders for this visit:  Uncontrolled type 2 diabetes mellitus with hyperglycemia without coma ([place]) Continue medication: metformin, novolin 70/30 30 units 34 AM, 28 units at lunch, 18 units PM  Provided print out with new instructions. -Lifestyle and instructions reviewed with patient and daughter at length Given new glucose log with instructions to check sugars PRIOR to meals, keep meal log to help evaluate what choices contributing to severely labile values Goal fasting <150 consistently, <180 prior to lunch/dinner If A1C poorly controlled plan to follow up in 1 month with Tobie Perdue for ongoing close diabetes management Continue diet and exercise - NEEDS TO START WALKING or riding bike. Perform daily foot/skin check, notify office of any concerning changes.  Check A1C next visit -     Insulin Pen Needle 31G X 8 MM MISC; Use one daily [place]  Essential hypertension Well controlled with current medications, continue lasix for edema Monitor blood pressure at home; patient to call if consistently greater than 130/80 Continue DASH diet.   Reminder to go to the ER if any CP, SOB, nausea, dizziness, severe HA, changes vision/speech, left arm numbness and tingling and jaw pain. -     olmesartan (BENICAR) 40 MG tablet; Take 1 tablet (40 mg total) by mouth daily.  Hyperlipidemia associated with type 2 diabetes mellitus ([place]) Currently not at goal; continue atorvastatin 20 mg daily Continue low cholesterol diet and exercise.  Check lipid panel.   CKD stage 2 due to type 2 diabetes mellitus (H[facility]) Increase fluids  Avoid NSAIDS Blood pressure control Monitor sugars  Will continue to monitor  Obesity (BMI 30.0-34.9) Discussed dietary and exercise modifications Decrease carbohydrates Increase water intake  Dilated cardiomyopathy ([facility]) Control B/P, glucose and cholesterol Low sodium diet Weight  stable Continue to monitor  Peripheral edema Improved with lasix 40 mg and daily compression hose - elevate legs TID, increase activity, increase water, decrease sodium intake.   Continue diet and meds as discussed. Further disposition pending results of labs. Discussed med's effects and SE's.   Over 30 minutes of exam, counseling, chart review, and critical decision making was performed.   Future Appointments  Date Time Provider [place]  08/09/2019  8:00 AM [facility] 2 [facility] None  08/09/2019  8:30 AM [name], MD [facility] None  09/12/2019  9:30 AM [name], MD [facility] None  10/09/2019  8:15 AM [name], MD [facility] None  03/10/2020  9:00 AM [name], MD [facility] None    ----------------------------------------------------------------------------------------------------------------------  HPI 73 y.o. male  presents for 3 month follow up on DMII, hypertension, cholesterol, obesity and vitamin D deficiency.   His daughter accompanies him today and assist him with getting his medications and refills.  he has a diagnosis of GERD which is currently managed by famotidine 40 mg daily he reports symptoms is currently well controlled, and denies breakthrough reflux, burning in chest, hoarseness or cough.    BMI is Body mass index is 34.06 kg/m., he admits exercise and diet hasn't been great, he admits he has not been exercising much due to heat and poor motivation. He is living with his granddaughter, she has been cooking, trying to help make better choices but he still "eats what he wants"- [place] diet with lots of breads and potatoes.  Discussed diet at length and benefits of this with his glucose control and overall health.  Patient is quite  ambivalent about change. He was previously very active and has reported walked ~4 miles daily, stopped "because I'm old."     Wt Readings from Last 3 Encounters:  07/16/19 211 lb (95.7 kg)   06/10/19 210 lb (95.3 kg)  05/10/19 210 lb 12.8 oz (95.6 kg)   Edema is significantly improved with addition of lasix 40 mg daily and wearing daily compression socks.  He has not increased his activity though each office encounter he reports he is going to start walking again.  He reports his granddaughter has moved out and she had her leave her bike as he wanted to start riding it.  He has not do so as of today.  He does get physical activity by doing work around the house specifically mowing the grass and yard work.  We continue to engage in discussion regarding barrier to increasing his activity. His blood pressure has been controlled at home, today their BP is BP: 130/80  He does not workout. He denies chest pain, shortness of breath, dizziness.  He has his home B/P cuff with him today.  It seem to be running higher than office readings.   He is on cholesterol medication (atorvastatin 20 mg every other day) and denies myalgias. His cholesterol is at goal. The cholesterol last visit was:   Lab Results  Component Value Date   CHOL 122 06/10/2019   HDL 38 (L) 06/10/2019   LDLCALC 61 06/10/2019   TRIG 151 (H) 06/10/2019   CHOLHDL 3.2 06/10/2019    He has been working on diet for T2 diabetes (on metformin and novolin 70/30 - doing better since switch to pen, 30 units AM, takes 24 units at noon, and takes another 16 units PM and denies foot ulcerations, increased appetite, nausea, paresthesia of the feet, polydipsia, polyuria, visual disturbances, vomiting and weight loss. He presents with log of highly labile values; daughter is concerned as apparently he will check some sugars after eating rather than fasting/prior to meal which limits our ability to interpret accurately.  He denies any hypoglycemic episodes/symptoms. Fasting has ranged 138-300's wiht one outlier 80.  lunch -[phone] and this is the largest meal of the day.  Evening 189-475 but most of the readings are in the 300's. - Last A1C in  the office was: 7.8.  Lab Results  Component Value Date   HGBA1C 7.8 (H) 06/10/2019   Patient is on Vitamin D supplement.   Lab Results  Component Value Date   VD25OH 100 03/07/2019     Patient is on allopurinol for gout and does not report a recent flare.  Lab Results  Component Value Date   LABURIC 6.1 03/07/2019      Current Medications:  Current Outpatient Medications on File Prior to Visit  Medication Sig  . allopurinol (ZYLOPRIM) 300 MG tablet TAKE 1 TABLET BY MOUTH  DAILY  . amLODipine (NORVASC) 10 MG tablet Take one tablet every night for BP  . aspirin 81 MG tablet Take 81 mg by mouth daily.    Marland Kitchen atorvastatin (LIPITOR) 20 MG tablet TAKE 1 TABLET BY MOUTH  DAILY  . bisoprolol-hydrochlorothiazide (ZIAC) 10-6.25 MG tablet TAKE 1 TABLET BY MOUTH  DAILY  . Cholecalciferol (VITAMIN D PO) Take 5,000 Units by mouth daily.  . diphenhydramine-acetaminophen (TYLENOL PM) 25-500 MG TABS tablet Take 1 tablet by mouth at bedtime as needed.   . famotidine (PEPCID) 40 MG tablet Takes 1 tablet daily for indigestion and heart burn.  . furosemide (LASIX) 40 MG  tablet TAKE 1 TABLET BY MOUTH  DAILY  . glipiZIDE (GLUCOTROL) 10 MG tablet 10 mg. 10 in the AM   10 at lunch time  . glucose blood test strip Use as instructed  . Insulin Isophane & Regular Human (NOVOLIN 70/30 FLEXPEN RELION) (70-30) 100 UNIT/ML PEN Inject into subcutaneous skin 32min before meals, 30units breakfast, 24units lunch and 16units dinner.  . Loratadine (CLARITIN PO) Take by mouth daily.    Marland Kitchen losartan (COZAAR) 100 MG tablet TAKE 1 TABLET BY MOUTH  DAILY  . magnesium oxide (MAG-OX) 400 MG tablet Take 400 mg by mouth 2 (two) times daily.   . metFORMIN (GLUCOPHAGE-XR) 500 MG 24 hr tablet TAKE 2 TABLETS BY MOUTH TWO TIMES DAILY  . minoxidil (LONITEN) 10 MG tablet Take 1 tablet Daily for BP  . montelukast (SINGULAIR) 10 MG tablet TAKE 1 TABLET BY MOUTH  EVERY NIGHT AT BEDTIME  . Polyethyl Glycol-Propyl Glycol (SYSTANE OP)  Apply to eye as needed.  . tadalafil (CIALIS) 20 MG tablet Take 1/2 to 1 tablet every 2 to 3 days as needed for XXXX (Sex)   No current facility-administered medications on file prior to visit.      Allergies: No Known Allergies   Medical History:  Past Medical History:  Diagnosis Date  . Allergy   . Colon polyp   . Diabetes mellitus without complication ([place])   . Dilated cardiomyopathy ([place])    non isch  . Gout   . Hemochromatosis   . Hypertension   . OSA (obstructive sleep apnea)    Family history- Reviewed and unchanged Social history- Reviewed and unchanged   Review of Systems:  Review of Systems  Constitutional: Negative for malaise/fatigue and weight loss.  HENT: Negative for hearing loss and tinnitus.   Eyes: Negative for blurred vision and double vision.  Respiratory: Negative for cough, shortness of breath and wheezing.   Cardiovascular: Negative for chest pain, palpitations, orthopnea, claudication and leg swelling.  Gastrointestinal: Negative for abdominal pain, blood in stool, constipation, diarrhea, heartburn, melena, nausea and vomiting.  Genitourinary: Negative.   Musculoskeletal: Negative for joint pain and myalgias.  Skin: Negative for rash.  Neurological: Negative for dizziness, tingling, sensory change, weakness and headaches.  Endo/Heme/Allergies: Negative for polydipsia.  Psychiatric/Behavioral: Negative.   All other systems reviewed and are negative.     Physical Exam: BP 130/80   Pulse 71   Temp 97.7 F (36.5 C)   Ht 5\' 6"  (1.676 m)   Wt 211 lb (95.7 kg)   SpO2 98%   BMI 34.06 kg/m  Wt Readings from Last 3 Encounters:  07/16/19 211 lb (95.7 kg)  06/10/19 210 lb (95.3 kg)  05/10/19 210 lb 12.8 oz (95.6 kg)   General Appearance: Well nourished, in no apparent distress. Eyes: PERRLA, EOMs, conjunctiva no swelling or erythema Sinuses: No Frontal/maxillary tenderness ENT/Mouth: Ext aud canals clear, TMs without erythema, bulging. No  erythema, swelling, or exudate on post pharynx.  Tonsils not swollen or erythematous. Hearing normal.  Neck: Supple, thyroid normal.  Respiratory: Respiratory effort normal, BS equal bilaterally without rales, rhonchi, wheezing or stridor.  Cardio: RRR with no MRGs. Brisk peripheral pulses without edema; bilateral compression hose in place. Abdomen: Soft, + BS.  Non tender, no guarding, rebound, hernias, masses. Lymphatics: Non tender without lymphadenopathy.  Musculoskeletal: Full ROM, 5/5 strength, Normal gait Skin: Warm, dry without rashes, lesions, ecchymosis.  Neuro: Cranial nerves intact. No cerebellar symptoms.  Psych: Awake and oriented X 3, normal affect, Insight  and Judgment appropriate.    Brett Sierras, NP 11:30 AM [facility]

## 2019-07-16 ENCOUNTER — Ambulatory Visit (INDEPENDENT_AMBULATORY_CARE_PROVIDER_SITE_OTHER): Payer: Medicare Other | Admitting: Adult Health Nurse Practitioner

## 2019-07-16 ENCOUNTER — Other Ambulatory Visit: Payer: Self-pay

## 2019-07-16 ENCOUNTER — Encounter: Payer: Self-pay | Admitting: Adult Health Nurse Practitioner

## 2019-07-16 VITALS — BP 130/80 | HR 71 | Temp 97.7°F | Ht 66.0 in | Wt 211.0 lb

## 2019-07-16 DIAGNOSIS — E1165 Type 2 diabetes mellitus with hyperglycemia: Secondary | ICD-10-CM

## 2019-07-16 DIAGNOSIS — I420 Dilated cardiomyopathy: Secondary | ICD-10-CM

## 2019-07-16 DIAGNOSIS — I10 Essential (primary) hypertension: Secondary | ICD-10-CM | POA: Diagnosis not present

## 2019-07-16 DIAGNOSIS — E66811 Obesity, class 1: Secondary | ICD-10-CM

## 2019-07-16 DIAGNOSIS — E669 Obesity, unspecified: Secondary | ICD-10-CM

## 2019-07-16 DIAGNOSIS — E1122 Type 2 diabetes mellitus with diabetic chronic kidney disease: Secondary | ICD-10-CM | POA: Diagnosis not present

## 2019-07-16 DIAGNOSIS — N182 Chronic kidney disease, stage 2 (mild): Secondary | ICD-10-CM

## 2019-07-16 DIAGNOSIS — E1169 Type 2 diabetes mellitus with other specified complication: Secondary | ICD-10-CM | POA: Diagnosis not present

## 2019-07-16 DIAGNOSIS — R609 Edema, unspecified: Secondary | ICD-10-CM | POA: Diagnosis not present

## 2019-07-16 DIAGNOSIS — E785 Hyperlipidemia, unspecified: Secondary | ICD-10-CM

## 2019-07-16 DIAGNOSIS — R600 Localized edema: Secondary | ICD-10-CM

## 2019-07-16 MED ORDER — INSULIN PEN NEEDLE 31G X 8 MM MISC: 3 refills | Status: DC

## 2019-07-16 MED ORDER — OLMESARTAN MEDOXOMIL 40 MG PO TABS: 40.0000 mg | ORAL_TABLET | Freq: Every day | ORAL | 0 refills | Status: DC

## 2019-07-16 MED ORDER — OLMESARTAN MEDOXOMIL 40 MG PO TABS: 40.0000 mg | ORAL_TABLET | Freq: Every day | ORAL | 3 refills | Status: DC

## 2019-07-16 NOTE — Patient Instructions (Addendum)
We are going to increase the insulin you give yourself.  70/30 insuline:  Mornings 34, Afternoon 28, Evening 18  We have sent in a refill for needles for you.  We sent in 30days of the Olmesartan blood pressure medication for you. We also sent in a 90days supply to start next months the the mail order.    8 Critical Weight-Loss Tips That Aren't Diet and Exercise  1. STARVE THE DISTRACTIONS  All too often when we eat, we're also multitasking: watching TV, answering emails, scrolling through social media. These habits are detrimental to having a strong, clear, healthy relationship with food, and they can hinder our ability to make dietary changes.  In order to truly focus on what you're eating, how much you're eating, why you're eating those specific foods and, most importantly, how those foods make you feel, you need to starve the distractions. That means when you eat, just eat. Focus on your food, the process it went through to end up on your plate, where it came from and how it nourishes you. With this technique, you're more likely to finish a meal feeling satiated.  2.  CONSIDER WHAT YOU'RE NOT WILLING TO DO  This might sound counterintuitive, but it can help provide a "why" when motivation is waning. Declare, in writing, what you are unwilling to do, for example "I am unwilling to be the old dad who cannot play sports with my children".  So consider what you're not willing to accept, write it down, and keep it at the ready.  3.  STOP LABELING FOOD "GOOD" AND "BAD"  You've probably heard someone say they ate something "bad." Maybe you've even said it yourself.  The trouble with 'bad' foods isn't that they'll send you to the grave after a bite or two. The trouble comes when we eat excessive portions of really calorie-dense foods meal after meal, day after day.  Instead of labeling foods as good or bad, think about which foods you can eat a lot of, and which ones you should just  eat a little of. Then, plan ways to eat the foods you really like in portions that fit with your overall goals. A good example of this would be having a slice of pizza alongside a club salad with chicken breast, avocado and a bit of dressing. This is vastly different than 3 slices of pizza, 4 breadsticks with cheese sauce and half of a liter of regular soda.  4.  BRUSH YOUR TEETH AFTER YOU EAT  Getting your mindset in order is important, but sometimes small habits can make a big difference. After eating, you still have the taste of food in their mouth, which often causes people to eat more even if they are full or engage in a nibble or two of dessert.  Brushing your teeth will remove the taste of food from your mouth, and the clean, minty freshness will serve as a cue that mealtime is over.  5.  FOCUS ON CROWDING NOT CUTTING  The most common first step during 'dieting' is to cut. We cut our portion sizes down, we cut out 'bad' foods, we cut out entire food groups. This act of cutting puts [place] and our minds into scarcity mode.  When something is off-limits, even if you're able to avoid it for a while, you could end up bingeing on it later because you've gone so long without it. So, instead of cutting, focus on crowding. If you crowd your plate  and fill it up with more foods like veggies and protein, it simply allows less room for the other stuff. In other words, shift your focus away from what you can't eat, and celebrate the foods that will help you reach your goals.  6.  TAKE TRACKING A STEP FURTHER  Track what you eat, when you ate it, how much you ate and how that food made you feel. Being completely honest with yourself and writing down every single thing that passes through your lips will help you start to notice that maybe you actually do snack, possibly take in more sugar than you thought, eat when you're bored rather than just hungry or maybe that you have a habit of snacking before bed while  watching TV.  The difference from simply tracking your food intake is you're taking into account how food makes you feel, as well as what you're doing while you're eating. This is about becoming more mindful of what, when and why you eat.  7.  PRIORITIZE GOOD SLEEP  One of the strongest risk factors for being overweight is poor sleep. When you're feeling tired, you're more likely to choose unhealthy comfort foods and to skip your workout. Additionally, sleep deprivation may slow down your metabolism. [employer]! Therefore, sleeping 7-8 hours per night can help with weight loss without having to change your diet or increase your physical activity. And if you feel you snore and still wake up tired, talk with me about sleep apnea.  8.  SET ASIDE TIME TO DISCONNECT  Just get out there. Disconnect from the electronics and connect to the elements. Not only will this help reduce stress (a major factor in weight gain) by giving your mind a break from the constant stimulation we've all become so accustomed to, but it may also reprogram your brain to connect with yourself and what you're feeling.      When it comes to diets, agreement about the perfect plan isn't easy to find, even among the experts. Experts at the [employer] developed an idea known as the Healthy Eating Plate. Just imagine a plate divided into logical, healthy portions.  The emphasis is on diet quality:  Load up on vegetables and fruits - one-half of your plate: Aim for color and variety, and remember that potatoes don't count.  Go for whole grains - one-quarter of your plate: Whole wheat, barley, wheat berries, quinoa, oats, brown rice, and foods made with them. If you want pasta, go with whole wheat pasta.  Protein power - one-quarter of your plate: Fish, chicken, beans, and nuts are all healthy, versatile protein sources. Limit red meat.  The diet, however, does go beyond the plate, offering a few other  suggestions.  Use healthy plant oils, such as olive, canola, soy, corn, sunflower and peanut. Check the labels, and avoid partially hydrogenated oil, which have unhealthy trans fats.  If you're thirsty, drink water. Coffee and tea are good in moderation, but skip sugary drinks and limit milk and dairy products to one or two daily servings.  The type of carbohydrate in the diet is more important than the amount. Some sources of carbohydrates, such as vegetables, fruits, whole grains, and beans-are healthier than others.  Finally, stay active.

## 2019-07-18 ENCOUNTER — Encounter: Payer: Self-pay | Admitting: Adult Health Nurse Practitioner

## 2019-07-18 MED ORDER — NOVOLIN 70/30 FLEXPEN RELION (70-30) 100 UNIT/ML ~~LOC~~ SUPN: PEN_INJECTOR | SUBCUTANEOUS | 4 refills | Status: DC

## 2019-08-09 ENCOUNTER — Other Ambulatory Visit: Payer: Self-pay

## 2019-08-09 ENCOUNTER — Inpatient Hospital Stay: Payer: Medicare Other | Attending: Oncology | Admitting: Oncology

## 2019-08-09 ENCOUNTER — Inpatient Hospital Stay: Payer: Medicare Other

## 2019-08-09 VITALS — BP 147/72 | HR 63 | Temp 98.2°F | Resp 17 | Ht 66.0 in | Wt 209.2 lb

## 2019-08-09 DIAGNOSIS — E119 Type 2 diabetes mellitus without complications: Secondary | ICD-10-CM | POA: Diagnosis not present

## 2019-08-09 DIAGNOSIS — Z23 Encounter for immunization: Secondary | ICD-10-CM | POA: Diagnosis not present

## 2019-08-09 DIAGNOSIS — Z87891 Personal history of nicotine dependence: Secondary | ICD-10-CM | POA: Insufficient documentation

## 2019-08-09 DIAGNOSIS — E7800 Pure hypercholesterolemia, unspecified: Secondary | ICD-10-CM | POA: Insufficient documentation

## 2019-08-09 DIAGNOSIS — I10 Essential (primary) hypertension: Secondary | ICD-10-CM | POA: Diagnosis not present

## 2019-08-09 DIAGNOSIS — G4730 Sleep apnea, unspecified: Secondary | ICD-10-CM | POA: Insufficient documentation

## 2019-08-09 LAB — FERRITIN: Ferritin: 124 ng/mL (ref 24–336)

## 2019-08-09 MED ORDER — INFLUENZA VAC A&B SA ADJ QUAD 0.5 ML IM PRSY
PREFILLED_SYRINGE | INTRAMUSCULAR | Status: AC
  Filled 2019-08-09: qty 0.5

## 2019-08-09 MED ORDER — INFLUENZA VAC A&B SA ADJ QUAD 0.5 ML IM PRSY
0.5000 mL | PREFILLED_SYRINGE | Freq: Once | INTRAMUSCULAR | Status: AC
  Administered 2019-08-09: 0.5 mL via INTRAMUSCULAR

## 2019-08-09 NOTE — Progress Notes (Signed)
  [facility] OFFICE PROGRESS NOTE   Diagnosis: Hereditary hemochromatosis  INTERVAL HISTORY:   Mr. Hemsworth returns as scheduled.  He feels well.  He has noticed increased leg edema since May of this year.  He last underwent phlebotomy in 2016.  Objective:  Vital signs in last 24 hours:  Blood pressure (!) 147/72, pulse 63, temperature 98.2 F (36.8 C), temperature source Temporal, resp. rate 17, height 5\' 6"  (1.676 m), weight 209 lb 3.2 oz (94.9 kg), SpO2 100 %.    Cardio: Distant heart sounds, regular rate and rhythm GI: No hepatosplenomegaly Vascular: Trace pitting edema at the lower leg bilaterally    Lab Results:  Lab Results  Component Value Date   WBC 7.2 06/10/2019   HGB 14.5 06/10/2019   HCT 40.6 06/10/2019   MCV 91.0 06/10/2019   PLT 228 06/10/2019   NEUTROABS 4,882 06/10/2019    CMP  Lab Results  Component Value Date   NA 142 06/10/2019   K 3.8 06/10/2019   CL 103 06/10/2019   CO2 29 06/10/2019   GLUCOSE 164 (H) 06/10/2019   BUN 22 06/10/2019   CREATININE 1.04 06/10/2019   CALCIUM 9.9 06/10/2019   PROT 6.8 06/10/2019   ALBUMIN 4.6 05/03/2017   AST 15 06/10/2019   ALT 24 06/10/2019   ALKPHOS 36 (L) 05/03/2017   BILITOT 0.6 06/10/2019   GFRNONAA 71 06/10/2019   GFRAA 83 06/10/2019   Ferritin on 04/08/2019: 99  Medications: I have reviewed the patient's current medications.   Assessment/Plan: 1. 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 He was last treated with phlebotomy therapy on 06/25/2015 2. History of tobacco use in the remote past. 3. Hypertension. 4. Hypercholesterolemia. 5. Diabetes. 6. Gout. 7. Sleep apnea. 8. Family history of hemochromatosis.    Disposition: Mr. Voit appears stable.  We will follow-up on the ferritin level from today.  The plan is to initiate phlebotomy therapy for a ferritin of greater than 100.  He will return for a lab visit in 4 months and 8 months.  He will be  scheduled for a 1 year office visit.  Mr. Kaiser received an influenza vaccine today.  Betsy Coder, MD  08/09/2019  8:32 AM

## 2019-08-13 ENCOUNTER — Telehealth: Payer: Self-pay

## 2019-08-13 NOTE — Telephone Encounter (Signed)
TC to Pt to inform him ferritin level is above goal. Informed Pt that Dr. Benay Spice wants to schedule him for 3 phlebotomy's spread out to 2-3 weeks and with the third phlebotomy a lab visit. Informed Pt he would receive a call from scheduling to confirm date and times of visit. Pt verbalized understanding. Schedule message sent

## 2019-08-13 NOTE — Telephone Encounter (Signed)
-----   Message from [name], MD sent at 08/12/2019 10:40 AM EDT ----- Please call patient, ferritin above goal range, schedule phlebotomy every 2-3 weeks for 3 treatments, cbc with number 3, f/u ferritin and office as scheduled

## 2019-08-14 ENCOUNTER — Telehealth: Payer: Self-pay | Admitting: Oncology

## 2019-08-14 NOTE — Telephone Encounter (Signed)
Scheduled appt per 10/20 sch message - pt aware of appt dates and times added

## 2019-08-19 ENCOUNTER — Inpatient Hospital Stay: Payer: Medicare Other

## 2019-08-19 ENCOUNTER — Other Ambulatory Visit: Payer: Self-pay

## 2019-08-19 DIAGNOSIS — E119 Type 2 diabetes mellitus without complications: Secondary | ICD-10-CM | POA: Diagnosis not present

## 2019-08-19 DIAGNOSIS — E7800 Pure hypercholesterolemia, unspecified: Secondary | ICD-10-CM | POA: Diagnosis not present

## 2019-08-19 DIAGNOSIS — Z23 Encounter for immunization: Secondary | ICD-10-CM | POA: Diagnosis not present

## 2019-08-19 DIAGNOSIS — Z87891 Personal history of nicotine dependence: Secondary | ICD-10-CM | POA: Diagnosis not present

## 2019-08-19 DIAGNOSIS — G4730 Sleep apnea, unspecified: Secondary | ICD-10-CM | POA: Diagnosis not present

## 2019-08-19 NOTE — Progress Notes (Signed)
Brett [name] presents today for phlebotomy per MD orders. 16 g Phlebotomy Kit used to access left AC. Phlebotomy procedure started at 0926 and ended at 0931. 550 grams removed. Patient observed for 25 minutes after procedure without any incident. Patient tolerated procedure well. IV needle removed intact.

## 2019-08-21 ENCOUNTER — Other Ambulatory Visit: Payer: Self-pay | Admitting: Internal Medicine

## 2019-08-21 ENCOUNTER — Other Ambulatory Visit: Payer: Self-pay | Admitting: Physician Assistant

## 2019-09-02 ENCOUNTER — Inpatient Hospital Stay: Payer: Medicare Other | Attending: Oncology

## 2019-09-02 ENCOUNTER — Other Ambulatory Visit: Payer: Self-pay

## 2019-09-02 DIAGNOSIS — Z79899 Other long term (current) drug therapy: Secondary | ICD-10-CM | POA: Insufficient documentation

## 2019-09-02 NOTE — Progress Notes (Signed)
Patient stayed 20 min post observation of phlebotomy, refused full 30 min. Has no complaints. VSS.

## 2019-09-03 DIAGNOSIS — E1165 Type 2 diabetes mellitus with hyperglycemia: Secondary | ICD-10-CM

## 2019-09-03 DIAGNOSIS — I10 Essential (primary) hypertension: Secondary | ICD-10-CM

## 2019-09-03 MED ORDER — INSULIN PEN NEEDLE 31G X 8 MM MISC: 6 refills | Status: DC

## 2019-09-05 ENCOUNTER — Other Ambulatory Visit: Payer: Self-pay | Admitting: Adult Health Nurse Practitioner

## 2019-09-05 MED ORDER — INSULIN PEN NEEDLE 31G X 8 MM MISC: 6 refills | Status: DC

## 2019-09-05 MED ORDER — BISOPROLOL-HYDROCHLOROTHIAZIDE 10-6.25 MG PO TABS: 1.0000 | ORAL_TABLET | Freq: Every day | ORAL | 1 refills | Status: DC

## 2019-09-12 ENCOUNTER — Encounter: Payer: Self-pay | Admitting: Internal Medicine

## 2019-09-12 ENCOUNTER — Ambulatory Visit (INDEPENDENT_AMBULATORY_CARE_PROVIDER_SITE_OTHER): Payer: Medicare Other | Admitting: Internal Medicine

## 2019-09-12 ENCOUNTER — Other Ambulatory Visit: Payer: Self-pay

## 2019-09-12 VITALS — BP 132/76 | HR 68 | Temp 97.9°F | Resp 16 | Ht 66.0 in | Wt 212.6 lb

## 2019-09-12 DIAGNOSIS — Z79899 Other long term (current) drug therapy: Secondary | ICD-10-CM | POA: Diagnosis not present

## 2019-09-12 DIAGNOSIS — I10 Essential (primary) hypertension: Secondary | ICD-10-CM

## 2019-09-12 DIAGNOSIS — E785 Hyperlipidemia, unspecified: Secondary | ICD-10-CM | POA: Diagnosis not present

## 2019-09-12 DIAGNOSIS — M1000 Idiopathic gout, unspecified site: Secondary | ICD-10-CM

## 2019-09-12 DIAGNOSIS — E559 Vitamin D deficiency, unspecified: Secondary | ICD-10-CM

## 2019-09-12 DIAGNOSIS — N138 Other obstructive and reflux uropathy: Secondary | ICD-10-CM | POA: Diagnosis not present

## 2019-09-12 DIAGNOSIS — Z794 Long term (current) use of insulin: Secondary | ICD-10-CM

## 2019-09-12 DIAGNOSIS — N1831 Chronic kidney disease, stage 3a: Secondary | ICD-10-CM

## 2019-09-12 DIAGNOSIS — E1169 Type 2 diabetes mellitus with other specified complication: Secondary | ICD-10-CM | POA: Diagnosis not present

## 2019-09-12 DIAGNOSIS — E0821 Diabetes mellitus due to underlying condition with diabetic nephropathy: Secondary | ICD-10-CM

## 2019-09-12 DIAGNOSIS — K219 Gastro-esophageal reflux disease without esophagitis: Secondary | ICD-10-CM | POA: Diagnosis not present

## 2019-09-12 DIAGNOSIS — N401 Enlarged prostate with lower urinary tract symptoms: Secondary | ICD-10-CM | POA: Diagnosis not present

## 2019-09-12 MED ORDER — TAMSULOSIN HCL 0.4 MG PO CAPS: ORAL_CAPSULE | ORAL | 3 refills | Status: DC

## 2019-09-12 NOTE — Patient Instructions (Signed)

## 2019-09-12 NOTE — Progress Notes (Signed)
History of Present Illness:      This very nice 73 y.o. DWM of [place]  (in [place] - 1960) presents for 3 month follow up with HTN, HLD, Pre-Diabetes and Vitamin D Deficiency.  His Daughter [name] supervises his meds & Diabetes.       Patient also c/o frequent urination with sensation of incomplete emptying and Nocturia 5-6 x about every hour.       Patient is followed by Dr Benay Spice for Hereditary Hemochromotosis and receives periodic phlebotomies.      Patient is treated for HTN  (1970) & BP has been controlled at home. Today's BP is at goal - 132/76. Patient has had no complaints of any cardiac type chest pain, palpitations, dyspnea / orthopnea / PND, dizziness, claudication, or dependent edema.      Hyperlipidemia is controlled with diet & meds. Patient denies myalgias or other med SE's. 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 Patient's  comprehension & insight re: his diabetic management is poor and supervised by his daughter,  [name].  He reports current mealtime Novolin 70/20m dosing is 34 u /BKFST, 28 u /L and 18 u /Supper.  CBG's are ranging betw 100's - 300's. He has had no symptoms of reactive hypoglycemia,  paresthesias or visual blurring.  Last A1c was not at goal:  Lab Results  Component Value Date   HGBA1C 7.8 (H) 06/10/2019   Wt Readings from Last 3 Encounters:  09/12/19 212 lb 9.6 oz (96.4 kg)  08/09/19 209 lb 3.2 oz (94.9 kg)  07/16/19 211 lb (95.7 kg)          Further, the patient also has history of Vitamin D Deficiency and supplements vitamin D without any suspected side-effects. Last vitamin D was at goal:  Lab Results  Component Value Date   VD25OH 100 03/07/2019    Current Outpatient Medications on File Prior  to Visit  Medication Sig  . allopurinol (ZYLOPRIM) 300 MG tablet Take 1 tablet Daily to Prevent Gout  . amLODipine (NORVASC) 10 MG tablet Take one tablet every night for BP  . aspirin 81 MG tablet Take 81 mg by mouth daily.    Marland Kitchen atorvastatin (LIPITOR) 20 MG tablet Take 1 tablet Daily for Cholesterol  . bisoprolol-hydrochlorothiazide (ZIAC) 10-6.25 MG tablet Take 1 tablet by mouth daily.  . Cholecalciferol (VITAMIN D PO) Take 5,000 Units by mouth daily.  . diphenhydramine-acetaminophen (TYLENOL PM) 25-500 MG TABS tablet Take 1 tablet by mouth at bedtime as needed.   . famotidine (PEPCID) 40 MG tablet Takes 1 tablet daily for indigestion and heart burn.  . furosemide (LASIX) 40 MG tablet Take 1 tablet Daily for BP, Fluid Retention  /  Ankle Swelling  . glipiZIDE (GLUCOTROL) 10 MG tablet Take 1 tablet 3 x /day with Meals for Diabetes  . glucose blood test strip Use as instructed  . Insulin Isophane & Regular Human (NOVOLIN 70/30 FLEXPEN RELION) (70-30) 100 UNIT/ML PEN Inject into subcutaneous skin 75min before meals, 34units breakfast, 28units lunch and 18units dinner.  . Insulin Pen Needle 31G X 8 MM MISC Use one twice a day and as needed [employer]  .  Loratadine (CLARITIN PO) Take by mouth daily.    Marland Kitchen losartan (COZAAR) 100 MG tablet TAKE 1 TABLET BY MOUTH  DAILY  . magnesium oxide (MAG-OX) 400 MG tablet Take 400 mg by mouth 2 (two) times daily.   . metFORMIN (GLUCOPHAGE-XR) 500 MG 24 hr tablet Take 2 tablets 2 x /day with Meals for Diabetes  . minoxidil (LONITEN) 10 MG tablet Take 1 tablet Daily for BP  . montelukast (SINGULAIR) 10 MG tablet Take 1 tablet Daily for Allergies  . olmesartan (BENICAR) 40 MG tablet Take 1 tablet (40 mg total) by mouth daily.  . tadalafil (CIALIS) 20 MG tablet Take 1/2 to 1 tablet every 2 to 3 days as needed for XXXX (Sex)   No current facility-administered medications on file prior to visit.    No Known Allergies  PMHx:   Past Medical History:  Diagnosis Date  .  Allergy   . Colon polyp   . Diabetes mellitus without complication ([place])   . 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 Gout   . Hemochromatosis   . Hypertension   . OSA (obstructive sleep apnea)    Immunization History  Administered Date(s) Administered  . DT 01/21/2014  . Fluad Quad(high Dose 65+) 08/09/2019  . Influenza Split 09/11/2013  . Influenza, High Dose Seasonal PF 07/27/2017  . Influenza,inj,Quad PF,6+ Mos 07/28/2014, 08/06/2018  . Influenza-Unspecified 08/21/2012  . Pneumococcal Conjugate-13 09/11/2014  . Pneumococcal-Unspecified 08/21/2012   Past Surgical History:  Procedure Laterality Date  . EYE SURGERY Bilateral    Cataract   FHx:    Reviewed / unchanged  SHx:    Reviewed / unchanged   Systems Review:  Constitutional: Denies fever, chills, wt changes, headaches, insomnia, fatigue, night sweats, change in appetite. Eyes: Denies redness, blurred vision, diplopia, discharge, itchy, watery eyes.  ENT: Denies discharge, congestion, post nasal drip, epistaxis, sore throat, earache, hearing loss, dental pain, tinnitus, vertigo, sinus pain, snoring.  CV: Denies chest pain, palpitations, irregular heartbeat, syncope, dyspnea, diaphoresis, orthopnea, PND, claudication or edema. Respiratory: denies cough, dyspnea, DOE, pleurisy, hoarseness, laryngitis, wheezing.  Gastrointestinal: Denies dysphagia, odynophagia, heartburn, reflux, water brash, abdominal pain or cramps, nausea, vomiting, bloating, diarrhea, constipation, hematemesis, melena, hematochezia  or hemorrhoids. Genitourinary: Denies dysuria, frequency, urgency, nocturia, hesitancy, discharge, hematuria or flank pain. Musculoskeletal: Denies arthralgias, myalgias, stiffness, jt. swelling, pain, limping or strain/sprain.  Skin: Denies pruritus, rash, hives, warts, acne, eczema or change in skin lesion(s). Neuro: No weakness, tremor, incoordination, spasms, paresthesia or pain. Psychiatric: Denies confusion,  memory loss or sensory loss. Endo: Denies change in weight, skin or hair change.  Heme/Lymph: No excessive bleeding, bruising or enlarged lymph nodes.  Physical Exam  BP 132/76   Pulse 68   Temp 97.9 F (36.6 C)   Resp 16   Ht 5\' 6"  (1.676 m)   Wt 212 lb 9.6 oz (96.4 kg)   BMI 34.31 kg/m   Appears  well nourished, well groomed  and in no distress.  Eyes: PERRLA, EOMs, conjunctiva no swelling or erythema. Sinuses: No frontal/maxillary tenderness ENT/Mouth: EAC's clear, TM's nl w/o erythema, bulging. Nares clear w/o erythema, swelling, exudates. Oropharynx clear without erythema or exudates. Oral hygiene is good. Tongue normal, non obstructing. Hearing intact.  Neck: Supple. Thyroid not palpable. Car 2+/2+ without bruits, nodes or JVD. Chest: Respirations nl with BS clear & equal w/o rales, rhonchi, wheezing or stridor.  Cor: Heart sounds normal w/ regular rate and rhythm without sig. murmurs, gallops, clicks or rubs.  Peripheral pulses normal and equal  without edema.  Abdomen: Soft & bowel sounds normal. Non-tender w/o guarding, rebound, hernias, masses or organomegaly.  Lymphatics: Unremarkable.  Musculoskeletal: Full ROM all peripheral extremities, joint stability, 5/5 strength and normal gait.  Skin: Warm, dry without exposed rashes, lesions or ecchymosis apparent.  Neuro: Cranial nerves intact, reflexes equal bilaterally. Sensory-motor testing grossly intact. Tendon reflexes grossly intact.  Pysch: Alert & oriented x 3.  Insight and judgement nl & appropriate. No ideations.  Assessment and Plan:  1. Essential hypertension  - Continue medication, monitor blood pressure at home.  - Continue DASH diet.  Reminder to go to the ER if any CP,  SOB, nausea, dizziness, severe HA, changes vision/speech.  - CBC with Diff - COMPLETE METABOLIC PANEL WITH GFR - Magnesium - TSH  2. Hyperlipidemia associated with type 2 diabetes mellitus ([place])  - Continue diet/meds, exercise,&  lifestyle modifications.  - Continue monitor periodic cholesterol/liver & renal functions   - Lipid Profile - TSH  3. 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   - Recommended increasing TID meal-time Insulin dosing to  Novolin 70/30n to  40 u /BKFST, 35 u /L and 20 u/Supper  - Hemoglobin A1c ([facility])  4. Vitamin D deficiency  - Continue supplementation.  - Vitamin D (25 hydroxy)  5. Hereditary hemochromatosis ([place])  - CBC with Diff  6. Idiopathic gout   7. Gastroesophageal reflux disease  - CBC with Diff - Uric acid  8. Medication management  - CBC with Diff - COMPLETE METABOLIC PANEL WITH GFR - Magnesium - Lipid Profile - TSH - Hemoglobin A1c ([facility]) - Vitamin D (25 hydroxy) - Uric acid  9. BPH with obstruction/lower urinary tract symptoms  - tamsulosin (FLOMAX) 0.4 MG CAPS capsule; Take 1 tablet at Bedtime for Prostate  Dispense: 90 capsule; Refill: 3        Discussed  regular exercise, BP monitoring, weight control to achieve/maintain BMI less than 25 and discussed med and SE's. Recommended labs to assess and monitor clinical status with further disposition pending results of labs.  I discussed the assessment and treatment plan with the patient. The patient & daughter were provided an opportunity to ask questions and all were answered. The patient & daughter agreed with the plan and demonstrated an understanding of the instructions.  I provided over 30 minutes of exam, counseling, chart review and  complex critical decision making.  Kirtland Bouchard, MD

## 2019-09-13 ENCOUNTER — Telehealth: Payer: Self-pay | Admitting: Oncology

## 2019-09-13 LAB — LIPID PANEL
Cholesterol: 118 mg/dL (ref <200)
HDL: 42 mg/dL (ref >=40)
LDL Cholesterol (Calc): 59 mg/dL (calc)
Non-HDL Cholesterol (Calc): 76 mg/dL (calc) (ref <130)
Total CHOL/HDL Ratio: 2.8 (calc) (ref <5.0)
Triglycerides: 87 mg/dL (ref <150)

## 2019-09-13 LAB — CBC WITH DIFFERENTIAL/PLATELET
Absolute Monocytes: 572 cells/uL (ref 200–950)
Basophils Absolute: 52 cells/uL (ref 0–200)
Basophils Relative: 0.8 %
Eosinophils Absolute: 182 cells/uL (ref 15–500)
Eosinophils Relative: 2.8 %
HCT: 37.2 % — ABNORMAL LOW (ref 38.5–50.0)
Hemoglobin: 13 g/dL — ABNORMAL LOW (ref 13.2–17.1)
Lymphs Abs: 1599 cells/uL (ref 850–3900)
MCH: 32.3 pg (ref 27.0–33.0)
MCHC: 34.9 g/dL (ref 32.0–36.0)
MCV: 92.3 fL (ref 80.0–100.0)
MPV: 9 fL (ref 7.5–12.5)
Monocytes Relative: 8.8 %
Neutro Abs: 4095 cells/uL (ref 1500–7800)
Neutrophils Relative %: 63 %
Platelets: 233 10*3/uL (ref 140–400)
RBC: 4.03 10*6/uL — ABNORMAL LOW (ref 4.20–5.80)
RDW: 13 % (ref 11.0–15.0)
Total Lymphocyte: 24.6 %
WBC: 6.5 10*3/uL (ref 3.8–10.8)

## 2019-09-13 LAB — COMPLETE METABOLIC PANEL WITHOUT GFR
AG Ratio: 2.1 (calc) (ref 1.0–2.5)
ALT: 26 U/L (ref 9–46)
AST: 20 U/L (ref 10–35)
Albumin: 4.5 g/dL (ref 3.6–5.1)
Alkaline phosphatase (APISO): 36 U/L (ref 35–144)
BUN: 20 mg/dL (ref 7–25)
CO2: 26 mmol/L (ref 20–32)
Calcium: 9.5 mg/dL (ref 8.6–10.3)
Chloride: 105 mmol/L (ref 98–110)
Creat: 1.03 mg/dL (ref 0.70–1.18)
GFR, Est African American: 83 mL/min/1.73m2
GFR, Est Non African American: 72 mL/min/1.73m2
Globulin: 2.1 g/dL (ref 1.9–3.7)
Glucose, Bld: 90 mg/dL (ref 65–99)
Potassium: 3.5 mmol/L (ref 3.5–5.3)
Sodium: 142 mmol/L (ref 135–146)
Total Bilirubin: 0.5 mg/dL (ref 0.2–1.2)
Total Protein: 6.6 g/dL (ref 6.1–8.1)

## 2019-09-13 LAB — VITAMIN D 25 HYDROXY (VIT D DEFICIENCY, FRACTURES): Vit D, 25-Hydroxy: 45 ng/mL (ref 30–100)

## 2019-09-13 LAB — HEMOGLOBIN A1C
Hgb A1c MFr Bld: 7.1 %{Hb} — ABNORMAL HIGH
Mean Plasma Glucose: 157 (calc)
eAG (mmol/L): 8.7 (calc)

## 2019-09-13 LAB — MAGNESIUM: Magnesium: 2 mg/dL (ref 1.5–2.5)

## 2019-09-13 LAB — TSH: TSH: 3.68 mIU/L (ref 0.40–4.50)

## 2019-09-13 LAB — URIC ACID: Uric Acid, Serum: 5.2 mg/dL (ref 4.0–8.0)

## 2019-09-13 NOTE — Telephone Encounter (Signed)
Scheduled appt per 11/20 sch message - pt aware of appt date and time

## 2019-09-14 ENCOUNTER — Encounter: Payer: Self-pay | Admitting: Internal Medicine

## 2019-09-16 ENCOUNTER — Inpatient Hospital Stay: Payer: Medicare Other

## 2019-09-16 ENCOUNTER — Other Ambulatory Visit: Payer: Self-pay

## 2019-09-16 DIAGNOSIS — Z79899 Other long term (current) drug therapy: Secondary | ICD-10-CM | POA: Diagnosis not present

## 2019-09-16 NOTE — Patient Instructions (Signed)
Therapeutic Phlebotomy Discharge Instructions  - Increase your fluid intake over the next 4 hours  - No smoking for 30 minutes  - Avoid using the affected arm (the one you had the blood drawn from) for heavy lifting or other activities.  - You may resume all normal activities after 30 minutes.  You are to notify the office if you experience:   - Persistent dizziness and/or lightheadedness -Uncontrolled or excessive bleeding at the site.    Therapeutic Phlebotomy, Care After This sheet gives you information about how to care for yourself after your procedure. Your health care provider may also give you more specific instructions. If you have problems or questions, contact your health care provider. What can I expect after the procedure? After the procedure, it is common to have:  Light-headedness or dizziness. You may feel faint.  Nausea.  Tiredness (fatigue). Follow these instructions at home: Eating and drinking  Be sure to eat well-balanced meals for the next 24 hours.  Drink enough fluid to keep your urine pale yellow.  Avoid drinking alcohol on the day that you had the procedure. Activity   Return to your normal activities as told by your health care provider. Most people can go back to their normal activities right away.  Avoid activities that take a lot of effort for about 5 hours after the procedure. Athletes should avoid strenuous exercise for at least 12 hours.  Avoid heavy lifting or pulling for about 5 hours after the procedure. Do not lift anything that is heavier than 10 lb (4.5 kg).  Change positions slowly for the remainder of the day. This will help to prevent light-headedness or fainting.  If you feel light-headed, lie down until the feeling goes away. Needle insertion site care   Keep your bandage (dressing) dry. You can remove the bandage after about 5 hours or as told by your health care provider.  If you have bleeding from the needle insertion  site, raise (elevate) your arm and press firmly on the site until the bleeding stops.  If you have bruising at the site, apply ice to the area: ? Remove the dressing. ? Put ice in a plastic bag. ? Place a towel between your skin and the bag. ? Leave the ice on for 20 minutes, 2-3 times a day for the first 24 hours.  If the swelling does not go away after 24 hours, apply a warm, moist cloth (warm compress) to the area for 20 minutes, 2-3 times a day. General instructions  Do not use any products that contain nicotine or tobacco, such as cigarettes and e-cigarettes, for at least 30 minutes after the procedure.  Keep all follow-up visits as told by your health care provider. This is important. You may need to continue having regular therapeutic phlebotomy treatments as directed. Contact a health care provider if you:  Have redness, swelling, or pain at the needle insertion site.  Have fluid or blood coming from the needle insertion site.  Have pus or a bad smell coming from the needle insertion site.  Notice that the needle insertion site feels warm to the touch.  Feel light-headed, dizzy, or nauseous, and the feeling does not go away.  Have new bruising at the needle insertion site.  Feel weaker than normal.  Have a fever or chills. Get help right away if:  You faint.  You have chest pain.  You have trouble breathing.  You have severe nausea or vomiting. Summary  After the procedure,  procedure, it is common to have some light-headedness, dizziness, nausea, or tiredness (fatigue).  Be sure to eat well-balanced meals for the next 24 hours. Drink enough fluid to keep your urine pale yellow.  Return to your normal activities as told by your health care provider.  Keep all follow-up visits as told by your health care provider. You may need to continue having regular therapeutic phlebotomy treatments as directed. This information is not intended to replace advice given to you by your  health care provider. Make sure you discuss any questions you have with your health care provider. Document Released: 03/14/2011 Document Revised: 10/27/2017 Document Reviewed: 10/26/2017 Elsevier Patient Education  2020 [employer]  

## 2019-09-16 NOTE — Progress Notes (Signed)
Therapeutic phlebotomy performed per MD orders in left AC using 16g angiocath. 510g of blood collected. Pt tolerated procedure well. VSS after observation period, discharged in no acute distress.

## 2019-10-09 ENCOUNTER — Other Ambulatory Visit: Payer: Self-pay

## 2019-10-09 ENCOUNTER — Encounter (INDEPENDENT_AMBULATORY_CARE_PROVIDER_SITE_OTHER): Payer: Medicare Other | Admitting: Ophthalmology

## 2019-10-09 DIAGNOSIS — H35033 Hypertensive retinopathy, bilateral: Secondary | ICD-10-CM

## 2019-10-09 DIAGNOSIS — E113393 Type 2 diabetes mellitus with moderate nonproliferative diabetic retinopathy without macular edema, bilateral: Secondary | ICD-10-CM

## 2019-10-09 DIAGNOSIS — H3533 Angioid streaks of macula: Secondary | ICD-10-CM

## 2019-10-09 DIAGNOSIS — E11319 Type 2 diabetes mellitus with unspecified diabetic retinopathy without macular edema: Secondary | ICD-10-CM | POA: Diagnosis not present

## 2019-10-09 DIAGNOSIS — H43813 Vitreous degeneration, bilateral: Secondary | ICD-10-CM

## 2019-10-09 DIAGNOSIS — I10 Essential (primary) hypertension: Secondary | ICD-10-CM | POA: Diagnosis not present

## 2019-10-11 ENCOUNTER — Other Ambulatory Visit: Payer: Self-pay | Admitting: Internal Medicine

## 2019-10-11 ENCOUNTER — Other Ambulatory Visit: Payer: Self-pay | Admitting: *Deleted

## 2019-10-11 DIAGNOSIS — I10 Essential (primary) hypertension: Secondary | ICD-10-CM

## 2019-10-14 ENCOUNTER — Other Ambulatory Visit: Payer: Self-pay

## 2019-10-14 ENCOUNTER — Inpatient Hospital Stay: Payer: Medicare Other | Attending: Oncology

## 2019-10-14 LAB — FERRITIN: Ferritin: 37 ng/mL (ref 24–336)

## 2019-10-15 ENCOUNTER — Telehealth: Payer: Self-pay

## 2019-10-15 NOTE — Telephone Encounter (Signed)
TC to pt per Dr Benay Spice to let him know that his ferritin is lower, and to follow-up for lab as scheduled. I let him know that his next lab appointment is scheduled for 12/10/19 @11 . Patient verbalized understanding. No further problems or concerns at this time.

## 2019-10-31 ENCOUNTER — Telehealth: Payer: Self-pay | Admitting: *Deleted

## 2019-10-31 NOTE — Telephone Encounter (Signed)
Patient called and reported Novolin 70/30 pens have increased in price to $128.  Per Dr Melford Aase, it was suggested the patient use the vials, which are less expensive.  A message was left to inform the daughter of the information.

## 2019-11-04 ENCOUNTER — Other Ambulatory Visit: Payer: Self-pay | Admitting: Adult Health

## 2019-11-04 DIAGNOSIS — N1831 Chronic kidney disease, stage 3a: Secondary | ICD-10-CM

## 2019-11-04 DIAGNOSIS — E0822 Diabetes mellitus due to underlying condition with diabetic chronic kidney disease: Secondary | ICD-10-CM

## 2019-11-04 MED ORDER — FAMOTIDINE 40 MG PO TABS: ORAL_TABLET | ORAL | 3 refills | Status: DC

## 2019-11-12 MED ORDER — FREESTYLE LIBRE 14 DAY SENSOR MISC: 12 refills | Status: DC

## 2019-12-10 ENCOUNTER — Other Ambulatory Visit: Payer: Self-pay

## 2019-12-10 ENCOUNTER — Telehealth: Payer: Self-pay

## 2019-12-10 ENCOUNTER — Inpatient Hospital Stay: Payer: Medicare Other | Attending: Oncology

## 2019-12-10 LAB — FERRITIN: Ferritin: 41 ng/mL (ref 24–336)

## 2019-12-10 NOTE — Telephone Encounter (Signed)
TC to Pt Per Dr. Benay Spice informed Pt that Ferritin level is in goal range (41). Pt is to follow up as scheduled. Pt verbalized understanding. No further problems or concerns noted.

## 2019-12-10 NOTE — Telephone Encounter (Signed)
-----   Message from [name], MD sent at 12/10/2019  1:28 PM EST ----- Please call patient, ferritin is in goal range, follow-up as scheduled

## 2019-12-17 ENCOUNTER — Encounter: Payer: Self-pay | Admitting: Adult Health Nurse Practitioner

## 2019-12-17 ENCOUNTER — Other Ambulatory Visit: Payer: Self-pay

## 2019-12-17 ENCOUNTER — Ambulatory Visit (INDEPENDENT_AMBULATORY_CARE_PROVIDER_SITE_OTHER): Payer: Medicare Other | Admitting: Adult Health Nurse Practitioner

## 2019-12-17 VITALS — BP 120/72 | HR 63 | Temp 97.7°F | Ht 66.0 in | Wt 220.0 lb

## 2019-12-17 DIAGNOSIS — G4733 Obstructive sleep apnea (adult) (pediatric): Secondary | ICD-10-CM | POA: Diagnosis not present

## 2019-12-17 DIAGNOSIS — I10 Essential (primary) hypertension: Secondary | ICD-10-CM

## 2019-12-17 DIAGNOSIS — R600 Localized edema: Secondary | ICD-10-CM | POA: Diagnosis not present

## 2019-12-17 DIAGNOSIS — Z0001 Encounter for general adult medical examination with abnormal findings: Secondary | ICD-10-CM

## 2019-12-17 DIAGNOSIS — N401 Enlarged prostate with lower urinary tract symptoms: Secondary | ICD-10-CM

## 2019-12-17 DIAGNOSIS — I420 Dilated cardiomyopathy: Secondary | ICD-10-CM | POA: Diagnosis not present

## 2019-12-17 DIAGNOSIS — E1165 Type 2 diabetes mellitus with hyperglycemia: Secondary | ICD-10-CM | POA: Diagnosis not present

## 2019-12-17 DIAGNOSIS — E1122 Type 2 diabetes mellitus with diabetic chronic kidney disease: Secondary | ICD-10-CM | POA: Diagnosis not present

## 2019-12-17 DIAGNOSIS — R350 Frequency of micturition: Secondary | ICD-10-CM

## 2019-12-17 DIAGNOSIS — E0821 Diabetes mellitus due to underlying condition with diabetic nephropathy: Secondary | ICD-10-CM | POA: Diagnosis not present

## 2019-12-17 DIAGNOSIS — Z79899 Other long term (current) drug therapy: Secondary | ICD-10-CM

## 2019-12-17 DIAGNOSIS — Z1159 Encounter for screening for other viral diseases: Secondary | ICD-10-CM | POA: Diagnosis not present

## 2019-12-17 DIAGNOSIS — Z794 Long term (current) use of insulin: Secondary | ICD-10-CM | POA: Diagnosis not present

## 2019-12-17 DIAGNOSIS — E559 Vitamin D deficiency, unspecified: Secondary | ICD-10-CM | POA: Diagnosis not present

## 2019-12-17 DIAGNOSIS — E669 Obesity, unspecified: Secondary | ICD-10-CM | POA: Diagnosis not present

## 2019-12-17 DIAGNOSIS — E785 Hyperlipidemia, unspecified: Secondary | ICD-10-CM

## 2019-12-17 DIAGNOSIS — K219 Gastro-esophageal reflux disease without esophagitis: Secondary | ICD-10-CM

## 2019-12-17 DIAGNOSIS — R6889 Other general symptoms and signs: Secondary | ICD-10-CM

## 2019-12-17 DIAGNOSIS — M1000 Idiopathic gout, unspecified site: Secondary | ICD-10-CM | POA: Diagnosis not present

## 2019-12-17 DIAGNOSIS — E66811 Obesity, class 1: Secondary | ICD-10-CM

## 2019-12-17 DIAGNOSIS — Z125 Encounter for screening for malignant neoplasm of prostate: Secondary | ICD-10-CM

## 2019-12-17 DIAGNOSIS — N138 Other obstructive and reflux uropathy: Secondary | ICD-10-CM

## 2019-12-17 DIAGNOSIS — N1831 Chronic kidney disease, stage 3a: Secondary | ICD-10-CM | POA: Diagnosis not present

## 2019-12-17 DIAGNOSIS — N182 Chronic kidney disease, stage 2 (mild): Secondary | ICD-10-CM

## 2019-12-17 DIAGNOSIS — E1169 Type 2 diabetes mellitus with other specified complication: Secondary | ICD-10-CM

## 2019-12-17 MED ORDER — TAMSULOSIN HCL 0.4 MG PO CAPS: ORAL_CAPSULE | ORAL | 3 refills | Status: DC

## 2019-12-17 NOTE — Progress Notes (Addendum)
MEDICARE ANNUAL WELLNESS VISIT & 3 MONTH FOLLOW UP   Assessment: Brett [name] was seen today for follow-up.  Diagnoses and all orders for this visit:  Essential hypertension Well controlled with current medications, continue lasix for edema Monitor blood pressure at home; patient to call if consistently greater than 130/80 Continue DASH diet.   Reminder to go to the ER if any CP, SOB, nausea, dizziness, severe HA, changes vision/speech, left arm numbness and tingling and jaw pain. -     olmesartan (BENICAR) 40 MG tablet; Take 1 tablet (40 mg total) by mouth daily.   Hyperlipidemia associated with type 2 diabetes mellitus ([place]) Currently not at goal; continue atorvastatin 20 mg daily Continue low cholesterol diet and exercise.  Check lipid panel.    Uncontrolled type 2 diabetes mellitus with hyperglycemia without coma ([place]) Continue medication: metformin, novolin 70/30 30 units 34 AM, 28 units at lunch, 18 units PM  Provided print out with new instructions. -Lifestyle and instructions reviewed with patient and daughter at length Given new glucose log with instructions to check sugars PRIOR to meals, keep meal log to help evaluate what choices contributing to severely labile values Goal fasting <150 consistently, <180 prior to lunch/dinner If A1C poorly controlled plan to follow up in 1 month with Clarissia Mckeen for ongoing close diabetes management Continue diet and exercise - NEEDS TO START WALKING or riding bike. Perform daily foot/skin check, notify office of any concerning changes.  Check A1C next visit -     Insulin Pen Needle 31G X 8 MM MISC; Use one daily [place]  CKD stage 2 due to type 2 diabetes mellitus (HCC) Increase fluids  Avoid NSAIDS Blood pressure control Monitor sugars  Will continue to monitor  Idiopathic gout, unspecified chronicity, unspecified site Continue allopurinol 300mg  daily No recent flares Discussed dietary modifications Continue to monitor  Gastroesophageal  reflux disease without esophagitis Doing well at this time Continue: famotidine 40mg  daily Diet discussed Monitor for triggers Avoid food with high acid content Avoid excessive cafeine Increase water intake  Vitamin D deficiency Continue supplementation Taking Vitamin D 5,000IU daily -     VITAMIN D 25 Hydroxy (Vit-D Deficiency, Fractures)  OSA (obstructive sleep apnea) Doing well at this time Discussed mask and hygiene  Obesity (BMI 30.0-34.9) Discussed dietary and exercise modifications  BPH with obstruction/lower urinary tract symptoms Discussed medications and side effects -     tamsulosin (FLOMAX) 0.4 MG CAPS capsule; Take 1 tablet at Bedtime for Prostate  Hereditary hemochromatosis ([place]) -Ferritin  Bilateral lower extremity edema Increase water intake Increase activity Avoid high sodium/processed foods Continue compression stockings  Dilated cardiomyopathy (HCC) Discussed daily weights Increase activity and water intake Avoid high sodium/processed foods  Need for hepatitis C screening test -     Hepatitis C antibody  Screening PSA (prostate specific antigen) -PSA - Urinary frequency -     Urinalysis w microscopic + reflex cultur -     REFLEXIVE URINE CULTURE   Obesity (BMI 30.0-34.9) Discussed dietary and exercise modifications Decrease carbohydrates Increase water intake   Medication management Continued     Continue diet and meds as discussed. Further disposition pending results of labs. Discussed med's effects and SE's.   Over 30 minutes of exam, counseling, chart review, and critical decision making was performed.   Future Appointments  Date Time Provider [place]  01/14/2020  9:15 AM [place] [facility]  04/02/2020  9:00 AM [name], MD [facility] None  04/08/2020 11:00 AM [facility]  LAB 1 [facility] None  04/13/2020  8:15 AM [name], MD T[facility]-TRE None  08/10/2020 11:30 AM [name], MD  [facility] None  12/17/2020  9:30 AM Garnet Sierras, NP [facility] None    Plan:   During the course of the visit the patient was educated and counseled about appropriate screening and preventive services including:    Pneumococcal vaccine   Influenza vaccine  Prevnar 13  Td vaccine  Screening electrocardiogram  Colorectal cancer screening  Diabetes screening  Glaucoma screening  Nutrition counseling  ----------------------------------------------------------------------------------------------------------------------  Subjective: 74 y.o. male  presents for 3 month follow up on DMII,HTN, HLD, GERD, DMII, Vitamin D deficiency .  His daughter accompanies him today and assist him with getting his medications and refills.   he has a diagnosis of GERD which is currently managed by famotidine 40 mg daily he reports symptoms is currently well controlled, and denies breakthrough reflux, burning in chest, hoarseness or cough.     BMI is Body mass index is 35.51 kg/m.,  He has not been exercising related to poor motivation and colder weather. He is living alone at this time but his daughter check in on him.  He eats what he wants- Brett [name] ([place]) diet with lots of breads and potatoes.  Discussed diet at length and benefits of this with his glucose control and overall health.  Patient is quite ambivalent about change. He was previously very active and has reported walked ~4 miles daily.  Encouraged heathy behaviors and to start back walking.    Wt Readings from Last 3 Encounters:  09/12/19 212 lb 9.6 oz (96.4 kg)  08/09/19 209 lb 3.2 oz (94.9 kg)  07/16/19 211 lb (95.7 kg)   Edema is significantly improved with addition of lasix 40 mg daily and wearing daily compression socks.  He has not increased his activity though each office encounter he reports he is going to start walking again.  He reports his granddaughter has moved out and she had her leave her bike as he wanted to start riding  it.  He has not do so as of today.  He does get physical activity by doing work around the house specifically mowing the grass and yard work.  We continue to engage in discussion regarding barrier to increasing his activity.  His HTN predates 48. His blood pressure has been controlled at home, today their BP is BP: 120/72  He does not workout. He denies chest pain, shortness of breath, dizziness.  He has his home B/P cuff with him today.  It seem to be running higher than office readings.   He is on cholesterol medication (atorvastatin 20 mg every other day) and denies myalgias. His cholesterol is at goal. The cholesterol last visit was:   Lab Results  Component Value Date   CHOL 131 12/17/2019   HDL 36 (L) 12/17/2019   LDLCALC 70 12/17/2019   TRIG 176 (H) 12/17/2019   CHOLHDL 3.6 12/17/2019    He has been working on diet for DMII with CKD (predates 2006)  (on metformin and novolin 70/30 - doing better since switch to pen, 34 units AM, takes 28 units at noon, and takes another 18 units PM and denies foot ulcerations, increased appetite, nausea, paresthesia of the feet, polydipsia, polyuria, visual disturbances, vomiting and weight loss. He presents with log of highly labile values; daughter is concerned as apparently he will check some sugars after eating rather than fasting/prior to meal which limits our ability to interpret accurately.  He denies any hypoglycemic episodes/symptoms. Fasting has ranged 140-300's. lunch -230's-300 and this is the largest meal of the day.  Evening 150-350 but most of the readings are in the 300's. - Last A1C in the office was: 7.1 down from 7.8.  Lab Results  Component Value Date   HGBA1C 7.1 (H) 09/12/2019   Patient is on Vitamin D supplement.   Lab Results  Component Value Date   VD25OH 43 12/17/2019     Patient is on allopurinol for gout and does not report a recent flare.  Lab Results  Component Value Date   LABURIC 5.2 09/12/2019      Current  Medications:  Current Outpatient Medications on File Prior to Visit  Medication Sig  . allopurinol (ZYLOPRIM) 300 MG tablet Take 1 tablet Daily to Prevent Gout  . amLODipine (NORVASC) 10 MG tablet TAKE 1 TABLET BY MOUTH AT  NIGHT FOR BLOOD PRESSURE  . aspirin 81 MG tablet Take 81 mg by mouth daily.    Marland Kitchen atorvastatin (LIPITOR) 20 MG tablet Take 1 tablet Daily for Cholesterol  . bisoprolol-hydrochlorothiazide (ZIAC) 10-6.25 MG tablet Take 1 tablet by mouth daily.  . Cholecalciferol (VITAMIN D PO) Take 5,000 Units by mouth daily.  . Continuous Blood Gluc Sensor (FREESTYLE LIBRE 14 DAY SENSOR) MISC Apply one sensor every 14 days as directed.  . diphenhydramine-acetaminophen (TYLENOL PM) 25-500 MG TABS tablet Take 1 tablet by mouth at bedtime as needed.   . famotidine (PEPCID) 40 MG tablet Takes 1 tablet daily for indigestion and heart burn.  . furosemide (LASIX) 40 MG tablet Take 1 tablet Daily for BP, Fluid Retention  /  Ankle Swelling  . glipiZIDE (GLUCOTROL) 10 MG tablet Take 1 tablet 3 x /day with Meals for Diabetes  . glucose blood test strip Use as instructed  . Loratadine (CLARITIN PO) Take by mouth daily.    . magnesium oxide (MAG-OX) 400 MG tablet Take 400 mg by mouth 2 (two) times daily.   . metFORMIN (GLUCOPHAGE-XR) 500 MG 24 hr tablet Take 2 tablets 2 x /day with Meals for Diabetes  . minoxidil (LONITEN) 10 MG tablet Take 1 tablet Daily for BP  . montelukast (SINGULAIR) 10 MG tablet Take 1 tablet Daily for Allergies  . olmesartan (BENICAR) 40 MG tablet Take 1 tablet (40 mg total) by mouth daily.  . tadalafil (CIALIS) 20 MG tablet Take 1/2 to 1 tablet every 2 to 3 days as needed for XXXX (Sex)   No current facility-administered medications on file prior to visit.     Allergies: No Known Allergies   Medical History:  Past Medical History:  Diagnosis Date  . Allergy   . Colon polyp   . Diabetes mellitus without complication ([place])   . Dilated cardiomyopathy ([place])    non isch   . Gout   . Hemochromatosis   . Hypertension   . OSA (obstructive sleep apnea)    Family history- Reviewed and unchanged Social history- Reviewed and unchanged      Immunization History  Administered Date(s) Administered  . DT 01/21/2014  . Influenza Split 09/11/2013  . Influenza, High Dose Seasonal PF 07/27/2017  . Influenza,inj,Quad PF,6+ Mos 07/28/2014, 08/06/2018  . Influenza-Unspecified 08/21/2012  . Pneumococcal Conjugate-13 09/11/2014  . Pneumococcal-Unspecified 08/21/2012   Prior vaccinations: TD or Tdap: 2015         Influenza: 2019 Pneumococcal: 2013 Prevnar13: 2-15 Shingrix: Discussed with patient, reports insurance does not cover.  Screening Tests: Last colonoscopy: 2016 Chest X-ray: 2015  EKG: 01/2018 ECHO 12/2009   MEDICARE WELLNESS OBJECTIVES: Physical activity: Current Exercise Habits: The patient does not participate in regular exercise at present, Exercise limited by: orthopedic condition(s) Cardiac risk factors: Cardiac Risk Factors include: advanced age (>31men, >46 women);diabetes mellitus;dyslipidemia;hypertension;male gender;obesity (BMI >30kg/m2);sedentary lifestyle Depression/mood screen:   Depression screen [facility] 2/9 12/17/2019  Decreased Interest 0  Down, Depressed, Hopeless 0  PHQ - 2 Score 0    ADLs:  In your present state of health, do you have any difficulty performing the following activities: 12/17/2019 09/12/2019  Hearing? Tempie Donning  Vision? N N  Difficulty concentrating or making decisions? Tempie Donning  Walking or climbing stairs? N N  Dressing or bathing? N N  Doing errands, shopping? Y Y  Comment Daughter assists with medications and office visits Daughter attends to help with instructions  Preparing Food and eating ? N -  In the past six months, have you accidently leaked urine? N -  Do you have problems with loss of bowel control? N -  Managing your Medications? N -  Managing your Finances? N -  Housekeeping or managing your Housekeeping?  N -  Some recent data might be hidden     Cognitive Testing  Alert? Yes  Normal Appearance?Yes  Oriented to person? Yes  Place? Yes   Time? Yes  Recall of three objects?  Yes  Can perform simple calculations? Yes  Displays appropriate judgment?Yes  Can read the correct time from a watch face?Yes  EOL planning: Does Patient Have a Medical Advance Directive?: Yes Type of Advance Directive: Healthcare Power of Attorney, Living will [place] in Chart?: No - copy requested    Review of Systems:  Review of Systems  Constitutional: Negative for malaise/fatigue and weight loss.  HENT: Negative for hearing loss and tinnitus.   Eyes: Negative for blurred vision and double vision.  Respiratory: Negative for cough, shortness of breath and wheezing.   Cardiovascular: Negative for chest pain, palpitations, orthopnea, claudication and leg swelling.  Gastrointestinal: Negative for abdominal pain, blood in stool, constipation, diarrhea, heartburn, melena, nausea and vomiting.  Genitourinary: Negative.   Musculoskeletal: Negative for joint pain and myalgias.  Skin: Negative for rash.  Neurological: Negative for dizziness, tingling, sensory change, weakness and headaches.  Endo/Heme/Allergies: Negative for polydipsia.  Psychiatric/Behavioral: Negative.   All other systems reviewed and are negative.     Physical Exam: BP 120/72   Pulse 63   Temp 97.7 F (36.5 C)   Ht 5\' 6"  (1.676 m)   Wt 220 lb (99.8 kg)   SpO2 96%   BMI 35.51 kg/m  Wt Readings from Last 3 Encounters:  09/12/19 212 lb 9.6 oz (96.4 kg)  08/09/19 209 lb 3.2 oz (94.9 kg)  07/16/19 211 lb (95.7 kg)   General Appearance: Well nourished, in no apparent distress. Eyes: PERRLA, EOMs, conjunctiva no swelling or erythema Sinuses: No Frontal/maxillary tenderness ENT/Mouth: Ext aud canals clear, TMs without erythema, bulging. No erythema, swelling, or exudate on post pharynx.  Tonsils not swollen  or erythematous. Hearing normal.  Neck: Supple, thyroid normal.  Respiratory: Respiratory effort normal, BS equal bilaterally without rales, rhonchi, wheezing or stridor.  Cardio: RRR with no MRGs. Brisk peripheral pulses, bilateral compression hose in place, +1 edema. Abdomen: Soft, + BS.  Non tender, no guarding, rebound, hernias, masses. Lymphatics: Non tender without lymphadenopathy.  Musculoskeletal: Full ROM, 5/5 strength, Normal gait Skin: Warm, dry without rashes, lesions, ecchymosis.  Neuro: Cranial nerves intact. No cerebellar  symptoms.  Psych: Awake and oriented X 3, normal affect, Insight and Judgment appropriate.   Medicare Attestation I have personally reviewed: The patient's medical and social history Their use of alcohol, tobacco or illicit drugs Their current medications and supplements The patient's functional ability including ADLs,fall risks, home safety risks, cognitive, and hearing and visual impairment Diet and physical activities Evidence for depression or mood disorders  The patient's weight, height, BMI, and visual acuity have been recorded in the chart.  I have made referrals, counseling, and provided education to the patient based on review of the above and I have provided the patient with a written personalized care plan for preventive services.    Garnet Sierras, NP 11:30 AM [facility]

## 2019-12-17 NOTE — Patient Instructions (Addendum)
    We have sent in Tamsulosin (Flowmax) 0.4mg  for you to take at bedtime.  This will help to reduce how much you go to the bathroom at night.    STOP taking the Losartan ( Cozar) 100mg .  Continue taking Olmesartan (Beicar) 40mg  daily   DRINK MORE WATER!!!  5-6 bottles a day, put them in the fridge behind your computer OR Fill up a water pitcher with the goal and drinking it all by end of the day.  Wear your compression stockings everyday!!!   Keep your 70/30 insulin dosing the same  40units in morning, 35units at lunch and 20units at dinner.

## 2019-12-18 LAB — CBC WITH DIFFERENTIAL/PLATELET
Absolute Monocytes: 603 cells/uL (ref 200–950)
Basophils Absolute: 41 cells/uL (ref 0–200)
Basophils Relative: 0.7 %
Eosinophils Absolute: 168 cells/uL (ref 15–500)
Eosinophils Relative: 2.9 %
HCT: 38.2 % — ABNORMAL LOW (ref 38.5–50.0)
Hemoglobin: 13.2 g/dL (ref 13.2–17.1)
Lymphs Abs: 1433 cells/uL (ref 850–3900)
MCH: 30.4 pg (ref 27.0–33.0)
MCHC: 34.6 g/dL (ref 32.0–36.0)
MCV: 88 fL (ref 80.0–100.0)
MPV: 9.7 fL (ref 7.5–12.5)
Monocytes Relative: 10.4 %
Neutro Abs: 3555 cells/uL (ref 1500–7800)
Neutrophils Relative %: 61.3 %
Platelets: 198 10*3/uL (ref 140–400)
RBC: 4.34 10*6/uL (ref 4.20–5.80)
RDW: 13.1 % (ref 11.0–15.0)
Total Lymphocyte: 24.7 %
WBC: 5.8 10*3/uL (ref 3.8–10.8)

## 2019-12-18 LAB — HEMOGLOBIN A1C
Hgb A1c MFr Bld: 8.3 % of total Hgb — ABNORMAL HIGH (ref <5.7)
Mean Plasma Glucose: 192 (calc)
eAG (mmol/L): 10.6 (calc)

## 2019-12-18 LAB — URINALYSIS W MICROSCOPIC + REFLEX CULTURE
Bacteria, UA: NONE SEEN /HPF
Bilirubin Urine: NEGATIVE
Glucose, UA: NEGATIVE
Hgb urine dipstick: NEGATIVE
Hyaline Cast: NONE SEEN /LPF
Ketones, ur: NEGATIVE
Leukocyte Esterase: NEGATIVE
Nitrites, Initial: NEGATIVE
Protein, ur: NEGATIVE
RBC / HPF: NONE SEEN /HPF (ref 0–2)
Specific Gravity, Urine: 1.013 (ref 1.001–1.03)
Squamous Epithelial / HPF: NONE SEEN /HPF (ref <=5)
WBC, UA: NONE SEEN /HPF (ref 0–5)
pH: 6.5 (ref 5.0–8.0)

## 2019-12-18 LAB — COMPLETE METABOLIC PANEL WITH GFR
AG Ratio: 1.8 (calc) (ref 1.0–2.5)
ALT: 29 U/L (ref 9–46)
AST: 17 U/L (ref 10–35)
Albumin: 4.4 g/dL (ref 3.6–5.1)
Alkaline phosphatase (APISO): 42 U/L (ref 35–144)
BUN: 25 mg/dL (ref 7–25)
CO2: 30 mmol/L (ref 20–32)
Calcium: 9.6 mg/dL (ref 8.6–10.3)
Chloride: 101 mmol/L (ref 98–110)
Creat: 1.06 mg/dL (ref 0.70–1.18)
GFR, Est African American: 80 mL/min/{1.73_m2} (ref >=60)
GFR, Est Non African American: 69 mL/min/{1.73_m2} (ref >=60)
Globulin: 2.4 g/dL (calc) (ref 1.9–3.7)
Glucose, Bld: 183 mg/dL — ABNORMAL HIGH (ref 65–99)
Potassium: 3.9 mmol/L (ref 3.5–5.3)
Sodium: 140 mmol/L (ref 135–146)
Total Bilirubin: 0.5 mg/dL (ref 0.2–1.2)
Total Protein: 6.8 g/dL (ref 6.1–8.1)

## 2019-12-18 LAB — LIPID PANEL
Cholesterol: 131 mg/dL (ref <200)
HDL: 36 mg/dL — ABNORMAL LOW (ref >=40)
LDL Cholesterol (Calc): 70 mg/dL (calc)
Non-HDL Cholesterol (Calc): 95 mg/dL (calc) (ref <130)
Total CHOL/HDL Ratio: 3.6 (calc) (ref <5.0)
Triglycerides: 176 mg/dL — ABNORMAL HIGH (ref <150)

## 2019-12-18 LAB — MAGNESIUM: Magnesium: 2.1 mg/dL (ref 1.5–2.5)

## 2019-12-18 LAB — VITAMIN D 25 HYDROXY (VIT D DEFICIENCY, FRACTURES): Vit D, 25-Hydroxy: 43 ng/mL (ref 30–100)

## 2019-12-18 LAB — NO CULTURE INDICATED

## 2019-12-18 LAB — TSH: TSH: 5.6 mIU/L — ABNORMAL HIGH (ref 0.40–4.50)

## 2019-12-18 LAB — INSULIN, RANDOM: Insulin: 23.2 u[IU]/mL — ABNORMAL HIGH

## 2019-12-18 LAB — HEPATITIS C ANTIBODY
Hepatitis C Ab: NONREACTIVE
SIGNAL TO CUT-OFF: 0.01 (ref <1.00)

## 2019-12-18 LAB — PSA: PSA: 0.5 ng/mL (ref <=4.0)

## 2019-12-19 ENCOUNTER — Ambulatory Visit: Payer: Medicare Other | Attending: Internal Medicine

## 2019-12-19 DIAGNOSIS — Z23 Encounter for immunization: Secondary | ICD-10-CM | POA: Insufficient documentation

## 2019-12-19 NOTE — Progress Notes (Signed)
   Covid-19 Vaccination Clinic  Name:  Brett [name]    MRN: [id number] DOB: 14-Dec-1945  12/19/2019  [name] was observed post Covid-19 immunization for 15 minutes without incidence. He was provided with Vaccine Information Sheet and instruction to access the V-Safe system.   [name] was instructed to call 911 with any severe reactions post vaccine: Marland Kitchen Difficulty breathing  . Swelling of your face and throat  . A fast heartbeat  . A bad rash all over your body  . Dizziness and weakness    Immunizations Administered    Name Date Dose VIS Date Route   Pfizer COVID-19 Vaccine 12/19/2019  2:08 PM 0.3 mL 10/04/2019 Intramuscular   Manufacturer: [place]   Lot: [id number]   [place]: [id number]

## 2019-12-20 ENCOUNTER — Other Ambulatory Visit: Payer: Self-pay | Admitting: Adult Health Nurse Practitioner

## 2019-12-20 DIAGNOSIS — E1165 Type 2 diabetes mellitus with hyperglycemia: Secondary | ICD-10-CM

## 2019-12-20 MED ORDER — INSULIN PEN NEEDLE 31G X 8 MM MISC: 3 refills | Status: DC

## 2019-12-20 MED ORDER — NOVOLIN 70/30 FLEXPEN RELION (70-30) 100 UNIT/ML ~~LOC~~ SUPN: PEN_INJECTOR | SUBCUTANEOUS | 4 refills | Status: DC

## 2019-12-20 NOTE — Progress Notes (Signed)
Your lab results are avilable for your review. _______________________________________________________________________ *Test results slightly outside the provided reference range are not unusual. If there is anything we need to address, I will review this with you, otherwise it is considered normal test values. If you have further questions, please do not hesitate to contact me  at the office or via My Chart.* ________________________________________________________________________  -Blood count is in normal range.  -Electrolytes, kidney and liver function are in normal range.  -Your cholesterol, good cholesterol (HDL) is low.  HDL, good cholesterol is low.  Increase this by fish, [place] nuts, almonds, pistachios, walnuts, peanuts, avocados, beans.  Triglycerides remain high.  These are directly linked with high blood sugars.  Improving diet, decreasing carbohydrates, bread, rice pasta, sugars will bring this back down.  It has been lower in the past.  If it remains high next check we will need to add another medication to decrease cholesterol.  -Your TSH, thyroid, is high at 5.60.  This will be rechecked as it is the first time it is elevated.  This could be what we call subclinical hypothyroidism and return back to normal. No treatment needed at this time.   -A1c, looking at glucose in your blood over the past three months increased to 8.3 from 7.1.  We should increase your insulin to 45units in AM, 40units in afternoon and 22units with dinner.  I will send in a new prescription for the pens.  Let me know if the price remains high and we can switch back to vials.  Urine sample did not show any signs of infection.  Take the flomax (Tamsulosin) before bed to see if this helps with decreasing urination through the night.  Let us know if you have any questions or concerns.   Sincerely,        Garnet Sierras, NP

## 2019-12-30 ENCOUNTER — Other Ambulatory Visit: Payer: Self-pay | Admitting: Adult Health

## 2019-12-30 DIAGNOSIS — E1165 Type 2 diabetes mellitus with hyperglycemia: Secondary | ICD-10-CM

## 2019-12-30 MED ORDER — NOVOLIN 70/30 FLEXPEN RELION (70-30) 100 UNIT/ML ~~LOC~~ SUPN: PEN_INJECTOR | SUBCUTANEOUS | 4 refills | Status: DC

## 2019-12-31 ENCOUNTER — Other Ambulatory Visit: Payer: Self-pay | Admitting: Adult Health Nurse Practitioner

## 2019-12-31 DIAGNOSIS — E1165 Type 2 diabetes mellitus with hyperglycemia: Secondary | ICD-10-CM

## 2019-12-31 MED ORDER — NOVOLIN 70/30 FLEXPEN RELION (70-30) 100 UNIT/ML ~~LOC~~ SUPN: PEN_INJECTOR | SUBCUTANEOUS | 4 refills | Status: DC

## 2019-12-31 MED ORDER — INSULIN ISOPHANE & REGULAR (HUMAN 70-30)100 UNIT/ML KWIKPEN: PEN_INJECTOR | SUBCUTANEOUS | 4 refills | Status: DC

## 2020-01-14 ENCOUNTER — Ambulatory Visit: Payer: Medicare Other | Attending: Internal Medicine

## 2020-01-14 DIAGNOSIS — Z23 Encounter for immunization: Secondary | ICD-10-CM

## 2020-01-14 NOTE — Progress Notes (Signed)
   Covid-19 Vaccination Clinic  Name:  BABY SAUCEMAN    MRN: [id number] DOB: 1946/07/11  01/14/2020  Mr. Heenan was observed post Covid-19 immunization for 15 minutes without incident. He was provided with Vaccine Information Sheet and instruction to access the V-Safe system.   Mr. Horigan was instructed to call 911 with any severe reactions post vaccine: Marland Kitchen Difficulty breathing  . Swelling of face and throat  . A fast heartbeat  . A bad rash all over body  . Dizziness and weakness   Immunizations Administered    Name Date Dose VIS Date Route   Pfizer COVID-19 Vaccine 01/14/2020  9:17 AM 0.3 mL 10/04/2019 Intramuscular   Manufacturer: [place]   Lot: [id number]   [place]: [id number]

## 2020-01-28 ENCOUNTER — Other Ambulatory Visit: Payer: Self-pay | Admitting: Physician Assistant

## 2020-01-28 DIAGNOSIS — I10 Essential (primary) hypertension: Secondary | ICD-10-CM

## 2020-02-02 ENCOUNTER — Other Ambulatory Visit: Payer: Self-pay | Admitting: Internal Medicine

## 2020-03-10 ENCOUNTER — Encounter: Payer: Medicare Other | Admitting: Internal Medicine

## 2020-03-11 ENCOUNTER — Other Ambulatory Visit: Payer: Self-pay | Admitting: Internal Medicine

## 2020-03-17 ENCOUNTER — Other Ambulatory Visit: Payer: Self-pay | Admitting: Internal Medicine

## 2020-03-17 DIAGNOSIS — I10 Essential (primary) hypertension: Secondary | ICD-10-CM

## 2020-04-02 ENCOUNTER — Ambulatory Visit (INDEPENDENT_AMBULATORY_CARE_PROVIDER_SITE_OTHER): Payer: Medicare Other | Admitting: Adult Health Nurse Practitioner

## 2020-04-02 ENCOUNTER — Encounter: Payer: Self-pay | Admitting: Adult Health Nurse Practitioner

## 2020-04-02 ENCOUNTER — Other Ambulatory Visit: Payer: Self-pay

## 2020-04-02 VITALS — BP 126/64 | HR 72 | Temp 97.2°F | Resp 18 | Ht 65.5 in | Wt 226.8 lb

## 2020-04-02 DIAGNOSIS — N138 Other obstructive and reflux uropathy: Secondary | ICD-10-CM

## 2020-04-02 DIAGNOSIS — E1122 Type 2 diabetes mellitus with diabetic chronic kidney disease: Secondary | ICD-10-CM | POA: Diagnosis not present

## 2020-04-02 DIAGNOSIS — E785 Hyperlipidemia, unspecified: Secondary | ICD-10-CM

## 2020-04-02 DIAGNOSIS — H6121 Impacted cerumen, right ear: Secondary | ICD-10-CM

## 2020-04-02 DIAGNOSIS — K219 Gastro-esophageal reflux disease without esophagitis: Secondary | ICD-10-CM

## 2020-04-02 DIAGNOSIS — Z0000 Encounter for general adult medical examination without abnormal findings: Secondary | ICD-10-CM

## 2020-04-02 DIAGNOSIS — N401 Enlarged prostate with lower urinary tract symptoms: Secondary | ICD-10-CM | POA: Diagnosis not present

## 2020-04-02 DIAGNOSIS — H9201 Otalgia, right ear: Secondary | ICD-10-CM | POA: Diagnosis not present

## 2020-04-02 DIAGNOSIS — G4733 Obstructive sleep apnea (adult) (pediatric): Secondary | ICD-10-CM

## 2020-04-02 DIAGNOSIS — R600 Localized edema: Secondary | ICD-10-CM

## 2020-04-02 DIAGNOSIS — E1169 Type 2 diabetes mellitus with other specified complication: Secondary | ICD-10-CM

## 2020-04-02 DIAGNOSIS — R350 Frequency of micturition: Secondary | ICD-10-CM

## 2020-04-02 DIAGNOSIS — I10 Essential (primary) hypertension: Secondary | ICD-10-CM | POA: Diagnosis not present

## 2020-04-02 DIAGNOSIS — I420 Dilated cardiomyopathy: Secondary | ICD-10-CM

## 2020-04-02 DIAGNOSIS — E559 Vitamin D deficiency, unspecified: Secondary | ICD-10-CM

## 2020-04-02 DIAGNOSIS — Z1389 Encounter for screening for other disorder: Secondary | ICD-10-CM

## 2020-04-02 DIAGNOSIS — E113293 Type 2 diabetes mellitus with mild nonproliferative diabetic retinopathy without macular edema, bilateral: Secondary | ICD-10-CM

## 2020-04-02 DIAGNOSIS — N182 Chronic kidney disease, stage 2 (mild): Secondary | ICD-10-CM

## 2020-04-02 DIAGNOSIS — E1165 Type 2 diabetes mellitus with hyperglycemia: Secondary | ICD-10-CM

## 2020-04-02 MED ORDER — FREESTYLE LIBRE 14 DAY SENSOR MISC: 11 refills | Status: DC

## 2020-04-02 NOTE — Patient Instructions (Addendum)
We have sent in the [facility] sensors to [place].  See how much they are and please let us know if they are too expensive we might need to go back to sticking your finger to check your blood glucose.   Continue the same dose of insulin 45units in the morning, 35units with lunch and 20units with dinner.  Increase the amount of water your drink, at least 5 bottles a day.  You let us know you are drinking three, which is a start.  GET OUT AND GET WALKING!!!!  This will help with the swelling in your legs.  Sitting in front of computer all day is making them worse.    We have cleaned out your ears today and both of them are free of any wax.   Please 3-4 drops of Olive oil in your right ear, place cotton ball to kepp it from spilling out.  Do this for the next 4 nights.  When you shower let warm water run into your ear, then tip head over to get it out.

## 2020-04-02 NOTE — Progress Notes (Signed)
ANNUAL PHYSICAL AND FOLLOW UP Assessment:   Brett [name] was seen today for annual exam.  Diagnoses and all orders for this visit:  Essential hypertension - Continue medications: norvasc 10mg , minoxidil 10mg , olmesartan 40mg , Lasix 40mg  DASH diet, exercise and monitor at home. Call if greater than 130/80.  -     CBC with Differential/Platelet -     COMPLETE METABOLIC PANEL WITH GFR -     Magnesium  Hyperlipidemia associated with type 2 diabetes mellitus (HCC) Continue medications: Atorvastatin 20mg  daily Discussed dietary and exercise modifications Low fat diet  CKD stage 2 due to type 2 diabetes mellitus (HCC) Increase fluids  Avoid NSAIDS Blood pressure control Monitor sugars  Will continue to monitor  Gastroesophageal reflux disease without esophagitis Doing well at this time Continue: famotidine 40mg  daily Diet discussed Monitor for triggers Avoid food with high acid content Avoid excessive cafeine Increase water intake  Type 2 diabetes mellitus with hyperglycemia (HCC) Continue medications: Discussed general issues about diabetes pathophysiology and management. Education: Reviewed 'ABCs' of diabetes management (respective goals in parentheses):  A1C (<7), blood pressure (<130/80), and cholesterol (LDL <70) Dietary recommendations Encouraged aerobic exercise.  Discussed foot care, check daily Yearly retinal exam Dental exam every 6 months Monitor blood glucose, discussed goal for patient  OSA (obstructive sleep apnea) Sleep apnea- continue CPAP,  Wearing 100% of time and doing well Denies any issues, continue with benefit weight loss still advised.  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 Questions answered fully. Emphasized salt restriction, less than 2000mg  a day. Encouraged daily monitoring of the patient's weight, call office if 5 lb weight loss or gain in a day.  Encouraged regular exercise. If any increasing shortness of breath, swelling, or chest pressure go to ER immediately.  decrease your fluid intake to less than 2 L daily please remember to always increase your potassium intake with any increase of your fluid pill.   Bilateral lower extremity edema Continue Lasix 40mg  daily Increase water intake  Vitamin D deficiency Continue supplementation to maintain goal of 60-100 Taking Vitamin D 5,000 IU daily  BMI 33.0-33.9,adult Discussed dietary and exercise modifications  Otalgia of right ear Impacted cerumen in right ear -Ear lavage completed Tolerated well Discussed ear hygiene Hearing imrpoved   Over 40 minutes of face to face exam, counseling, chart review, and critical decision making was performed  Future Appointments  Date Time Provider [place]  04/08/2020 11:00 AM [facility] 1 [facility] None  04/13/2020  8:15 AM [name], MD [facility] None  08/10/2020 11:30 AM [name], MD [facility] None  12/17/2020  9:30 AM Garnet Sierras, NP [facility] None  04/06/2021  9:00 AM Garnet Sierras, NP [facility] None    HPI:  Brett [name] is a 74 y.o. male who presents for annual physical exam and follow up for HTN, HLD,GERD,  DMII with CKD, BPH and Vitamin D Deficiency. Patient has hx/o Gout controlled on his allopurinol. He is followed by Dr Benay Spice for Hereditary Hemochromatosis with periodic phlebotomies and due for ferritin recheck.  Patients HTN  predates 1970 His blood pressure has been controlled at home, today their BP is BP: 126/64  He does not workout. He denies chest pain, shortness of breath, dizziness.  He is on cholesterol medication and denies myalgias. His cholesterol is at goal. The cholesterol last visit was:   Lab Results  Component Value Date   CHOL 131 12/17/2019   HDL 36 (L) 12/17/2019   LDLCALC 70 12/17/2019   TRIG 176 (H) 12/17/2019   CHOLHDL 3.6 12/17/2019   He has not been working on diet and exercise for DMII (2006), and denies hyperglycemia, hypoglycemia , increased appetite, nausea, polydipsia, polyuria and visual disturbances. Last A1C in the office was:  Lab Results  Component Value Date   HGBA1C 8.3 (H) 12/17/2019   Last GFR Lab Results  Component Value Date   [facility] 69 12/17/2019     Lab Results  Component Value Date   GFRAA 80 12/17/2019   Patient is on Vitamin D supplement.   Lab Results  Component Value Date   VD25OH 43 12/17/2019      Medication Review:  Current Outpatient Medications (Endocrine & Metabolic):  .  glipiZIDE (GLUCOTROL) 10 MG tablet, Take 1 tablet 3 x /day with Meals for Diabetes .  insulin isophane & regular human (HUMULIN 70/30 MIX) (70-30) 100 UNIT/ML KwikPen, Inject into subcutaneous skin 108min before meals, 45units breakfast, 40units lunch and 22units dinner. .  metFORMIN (GLUCOPHAGE-XR) 500 MG 24 hr tablet, Take 2 tablets 2 x /day with Meals for Diabetes  Current Outpatient Medications (Cardiovascular):  .  amLODipine (NORVASC) 10 MG tablet, TAKE 1 TABLET BY MOUTH AT  NIGHT FOR BLOOD PRESSURE .  atorvastatin (LIPITOR) 20 MG tablet, Take 1 tablet Daily for Cholesterol .  bisoprolol-hydrochlorothiazide (ZIAC) 10-6.25 MG tablet, TAKE 1 TABLET BY MOUTH  DAILY FOR BLOOD PRESSURE .  furosemide (LASIX) 40 MG tablet, Take 1 tablet Daily for BP, Fluid Retention  /  Ankle Swelling .  minoxidil (LONITEN) 10 MG tablet, TAKE 1 TABLET BY MOUTH  DAILY FOR BLOOD PRESSURE .   olmesartan (BENICAR) 40 MG tablet, Take 1 tablet (40 mg total) by mouth daily. .  tadalafil (CIALIS) 20 MG tablet, Take 1/2 to 1 tablet every 2 to 3 days as needed for XXXXX  Current Outpatient Medications (Respiratory):  Marland Kitchen  Loratadine (CLARITIN PO), Take by mouth daily.   .  montelukast (SINGULAIR) 10 MG tablet, Take 1 tablet Daily for Allergies  Current Outpatient Medications (Analgesics):  .  allopurinol (ZYLOPRIM) 300 MG tablet, Take 1 tablet Daily to Prevent Gout .  aspirin 81 MG tablet, Take 81 mg by mouth daily.     Current Outpatient Medications (Other):  Marland Kitchen  Cholecalciferol (VITAMIN D PO), Take 5,000 Units by mouth daily. .  Continuous Blood Gluc Sensor (FREESTYLE LIBRE 14 DAY SENSOR) MISC, Apply one sensor every 14 days as directed. .  diphenhydramine-acetaminophen (TYLENOL PM) 25-500 MG TABS tablet, Take 1 tablet by mouth at bedtime as needed.  .  famotidine (PEPCID) 40 MG tablet, Takes 1 tablet daily for indigestion and heart burn. Marland Kitchen  glucose blood test strip, Use as instructed .  Insulin Pen Needle 31G X 8 MM MISC, Use one three times a day and as needed [place] .  magnesium oxide (MAG-OX) 400 MG tablet, Take 400 mg by mouth 2 (two) times daily.  .  tamsulosin (FLOMAX) 0.4 MG CAPS capsule, Take 1 tablet at Bedtime for Prostate  Allergies: No Known Allergies  Current Problems (verified) has Hemochromatosis; CKD  stage 2 due to type 2 diabetes mellitus ([place]); Hypertension; OSA (obstructive sleep apnea); Gout; Dilated cardiomyopathy ([place]); Hyperlipidemia associated with type 2 diabetes mellitus ([place]); Vitamin D deficiency; Medication management; Obesity (BMI 30.0-34.9); Gluttony; Type 2 diabetes mellitus with stage 2 chronic kidney disease, with long-term current use of insulin ([place]); Diabetic retinopathy ([place]); Former smoker; FHx: heart disease; and Gastroesophageal reflux disease on their problem list.  Screening Tests Immunization History  Administered Date(s) Administered  . DT  (Pediatric) 01/21/2014  . Fluad Quad(high Dose 65+) 08/09/2019  . Influenza Split 09/11/2013  . Influenza, High Dose Seasonal PF 07/27/2017  . Influenza,inj,Quad PF,6+ Mos 07/28/2014, 08/06/2018  . Influenza-Unspecified 08/21/2012  . PFIZER SARS-COV-2 Vaccination 12/19/2019, 01/14/2020  . Pneumococcal Conjugate-13 09/11/2014  . Pneumococcal-Unspecified 08/21/2012    Preventative care: Last colonoscopy: 2016  Chest X-ray: 2015 EKG: 01/2018 ECHO 12/2009  Prior vaccinations: TD or Tdap: 2015  Influenza: 2019 Pneumococcal: 2013 Prevnar13: 2-15 Shingrix: Discussed with patient, reports insurance does not cover.  Names of Other Physician/Practitioners you currently use: 1. [facility] here for primary care 2. Diabetic Eye Exam 12/2019 3. Dental Exam 08/2020, has had teeth pulled using chorahexadine? Having partial made.  Patient Care Team: [name], MD as PCP - General (Internal Medicine) [name], MD as Consulting Physician (Oncology) Stanford Breed Denice Bors, MD as Consulting Physician (Cardiology) [name], MD as Consulting Physician (Ophthalmology)  Surgical: He  has a past surgical history that includes Eye surgery (Bilateral). Family His family history includes Cancer in his maternal uncle; Diabetes in his brother, father, and another family member; Heart attack in his brother; Heart disease in an other family member; Hyperlipidemia in an other family member; Hypertension in an other family member. Social history  He reports that he quit smoking about 43 years ago. His smoking use included cigarettes. He started smoking about 62 years ago. He has a 57.00 pack-year smoking history. He has never used smokeless tobacco. He reports current alcohol use. He reports that he does not use drugs.     Objective:   Today's Vitals   04/02/20 0911  BP: 126/64  Pulse: 72  Resp: 18  Temp: (!) 97.2 F (36.2 C)  SpO2: 97%   Weight: 226 lb 12.8 oz (102.9 kg)  Height: 5' 5.5" (1.664 m)   Body mass index is 37.17 kg/m.  General appearance: alert, no distress, WD/WN, male HEENT: normocephalic, sclerae anicteric, TMs pearly, nares patent, no discharge or erythema, pharynx normal Oral cavity: MMM, no lesions Neck: supple, no lymphadenopathy, no thyromegaly, no masses Heart: RRR, normal S1, S2, no murmurs Lungs: CTA bilaterally, no wheezes, rhonchi, or rales Abdomen: +bs, soft, non tender, non distended, no masses, no hepatomegaly, no splenomegaly Musculoskeletal: nontender, no swelling, no obvious deformity Extremities: no edema, no cyanosis, no clubbing Pulses: 2+ symmetric, upper and lower extremities, normal cap refill Neurological: alert, oriented x 3, CN2-12 intact, strength normal upper extremities and lower extremities, sensation normal throughout, DTRs 2+ throughout, no cerebellar signs, gait normal Psychiatric: normal affect, behavior normal, pleasant   EKG: LBBB, NSR, no ST changes   Garnet Sierras, NP   04/02/2020  [facility]

## 2020-04-03 LAB — HEMOGLOBIN A1C
Hgb A1c MFr Bld: 7.6 % of total Hgb — ABNORMAL HIGH (ref <5.7)
Mean Plasma Glucose: 171 (calc)
eAG (mmol/L): 9.5 (calc)

## 2020-04-03 LAB — COMPLETE METABOLIC PANEL WITH GFR
AG Ratio: 2 (calc) (ref 1.0–2.5)
ALT: 21 U/L (ref 9–46)
AST: 16 U/L (ref 10–35)
Albumin: 4.3 g/dL (ref 3.6–5.1)
Alkaline phosphatase (APISO): 37 U/L (ref 35–144)
BUN: 17 mg/dL (ref 7–25)
CO2: 28 mmol/L (ref 20–32)
Calcium: 9.5 mg/dL (ref 8.6–10.3)
Chloride: 103 mmol/L (ref 98–110)
Creat: 1.04 mg/dL (ref 0.70–1.18)
GFR, Est African American: 82 mL/min/{1.73_m2} (ref >=60)
GFR, Est Non African American: 71 mL/min/{1.73_m2} (ref >=60)
Globulin: 2.2 g/dL (calc) (ref 1.9–3.7)
Glucose, Bld: 156 mg/dL — ABNORMAL HIGH (ref 65–99)
Potassium: 4.2 mmol/L (ref 3.5–5.3)
Sodium: 139 mmol/L (ref 135–146)
Total Bilirubin: 0.6 mg/dL (ref 0.2–1.2)
Total Protein: 6.5 g/dL (ref 6.1–8.1)

## 2020-04-03 LAB — LIPID PANEL
Cholesterol: 117 mg/dL (ref <200)
HDL: 46 mg/dL (ref >=40)
LDL Cholesterol (Calc): 56 mg/dL (calc)
Non-HDL Cholesterol (Calc): 71 mg/dL (calc) (ref <130)
Total CHOL/HDL Ratio: 2.5 (calc) (ref <5.0)
Triglycerides: 73 mg/dL (ref <150)

## 2020-04-03 LAB — URINALYSIS W MICROSCOPIC + REFLEX CULTURE
Bacteria, UA: NONE SEEN /HPF
Bilirubin Urine: NEGATIVE
Glucose, UA: NEGATIVE
Hgb urine dipstick: NEGATIVE
Hyaline Cast: NONE SEEN /LPF
Ketones, ur: NEGATIVE
Leukocyte Esterase: NEGATIVE
Nitrites, Initial: NEGATIVE
Protein, ur: NEGATIVE
RBC / HPF: NONE SEEN /HPF (ref 0–2)
Specific Gravity, Urine: 1.014 (ref 1.001–1.03)
Squamous Epithelial / HPF: NONE SEEN /HPF (ref <=5)
WBC, UA: NONE SEEN /HPF (ref 0–5)
pH: <=5 (ref 5.0–8.0)

## 2020-04-03 LAB — CBC WITH DIFFERENTIAL/PLATELET
Absolute Monocytes: 578 cells/uL (ref 200–950)
Basophils Absolute: 30 cells/uL (ref 0–200)
Basophils Relative: 0.5 %
Eosinophils Absolute: 130 cells/uL (ref 15–500)
Eosinophils Relative: 2.2 %
HCT: 36.7 % — ABNORMAL LOW (ref 38.5–50.0)
Hemoglobin: 12.7 g/dL — ABNORMAL LOW (ref 13.2–17.1)
Lymphs Abs: 1292 cells/uL (ref 850–3900)
MCH: 30.9 pg (ref 27.0–33.0)
MCHC: 34.6 g/dL (ref 32.0–36.0)
MCV: 89.3 fL (ref 80.0–100.0)
MPV: 9.6 fL (ref 7.5–12.5)
Monocytes Relative: 9.8 %
Neutro Abs: 3870 cells/uL (ref 1500–7800)
Neutrophils Relative %: 65.6 %
Platelets: 173 10*3/uL (ref 140–400)
RBC: 4.11 10*6/uL — ABNORMAL LOW (ref 4.20–5.80)
RDW: 13.7 % (ref 11.0–15.0)
Total Lymphocyte: 21.9 %
WBC: 5.9 10*3/uL (ref 3.8–10.8)

## 2020-04-03 LAB — MICROALBUMIN / CREATININE URINE RATIO
Creatinine, Urine: 78 mg/dL (ref 20–320)
Microalb Creat Ratio: 8 mcg/mg creat (ref <30)
Microalb, Ur: 0.6 mg/dL

## 2020-04-03 LAB — TSH: TSH: 3.95 mIU/L (ref 0.40–4.50)

## 2020-04-03 LAB — IRON, TOTAL/TOTAL IRON BINDING CAP
%SAT: 30 % (calc) (ref 20–48)
Iron: 101 ug/dL (ref 50–180)
TIBC: 342 mcg/dL (calc) (ref 250–425)

## 2020-04-03 LAB — FERRITIN: Ferritin: 24 ng/mL (ref 24–380)

## 2020-04-03 LAB — MAGNESIUM: Magnesium: 2.2 mg/dL (ref 1.5–2.5)

## 2020-04-03 LAB — NO CULTURE INDICATED

## 2020-04-08 ENCOUNTER — Inpatient Hospital Stay: Payer: Medicare Other | Attending: Oncology

## 2020-04-08 ENCOUNTER — Other Ambulatory Visit: Payer: Self-pay

## 2020-04-08 DIAGNOSIS — D509 Iron deficiency anemia, unspecified: Secondary | ICD-10-CM | POA: Insufficient documentation

## 2020-04-08 LAB — FERRITIN: Ferritin: 34 ng/mL (ref 24–336)

## 2020-04-09 ENCOUNTER — Telehealth: Payer: Self-pay | Admitting: *Deleted

## 2020-04-09 NOTE — Telephone Encounter (Signed)
Left VM for return call re: labs

## 2020-04-09 NOTE — Telephone Encounter (Signed)
-----   Message from [name], MD sent at 04/08/2020  9:10 PM EDT ----- Please call patient, ferritin is in goal range, f/u as scheduled

## 2020-04-10 ENCOUNTER — Telehealth: Payer: Self-pay | Admitting: Oncology

## 2020-04-10 ENCOUNTER — Other Ambulatory Visit: Payer: Medicare Other

## 2020-04-10 NOTE — Telephone Encounter (Signed)
Scheduled per 6/18 sch message.added lab appt. Mailing pt appt calendar.

## 2020-04-13 ENCOUNTER — Other Ambulatory Visit: Payer: Self-pay

## 2020-04-13 ENCOUNTER — Encounter (INDEPENDENT_AMBULATORY_CARE_PROVIDER_SITE_OTHER): Payer: Medicare Other | Admitting: Ophthalmology

## 2020-04-13 DIAGNOSIS — E113292 Type 2 diabetes mellitus with mild nonproliferative diabetic retinopathy without macular edema, left eye: Secondary | ICD-10-CM | POA: Diagnosis not present

## 2020-04-13 DIAGNOSIS — H35033 Hypertensive retinopathy, bilateral: Secondary | ICD-10-CM

## 2020-04-13 DIAGNOSIS — I10 Essential (primary) hypertension: Secondary | ICD-10-CM

## 2020-04-13 DIAGNOSIS — E113391 Type 2 diabetes mellitus with moderate nonproliferative diabetic retinopathy without macular edema, right eye: Secondary | ICD-10-CM | POA: Diagnosis not present

## 2020-04-13 DIAGNOSIS — H43813 Vitreous degeneration, bilateral: Secondary | ICD-10-CM

## 2020-04-13 DIAGNOSIS — E11319 Type 2 diabetes mellitus with unspecified diabetic retinopathy without macular edema: Secondary | ICD-10-CM

## 2020-04-13 DIAGNOSIS — H35372 Puckering of macula, left eye: Secondary | ICD-10-CM

## 2020-04-13 LAB — HM DIABETES EYE EXAM

## 2020-04-15 ENCOUNTER — Encounter: Payer: Self-pay | Admitting: *Deleted

## 2020-04-27 ENCOUNTER — Other Ambulatory Visit: Payer: Self-pay | Admitting: Internal Medicine

## 2020-05-11 ENCOUNTER — Other Ambulatory Visit: Payer: Self-pay | Admitting: Internal Medicine

## 2020-05-11 ENCOUNTER — Other Ambulatory Visit: Payer: Self-pay | Admitting: Adult Health Nurse Practitioner

## 2020-05-11 DIAGNOSIS — I10 Essential (primary) hypertension: Secondary | ICD-10-CM

## 2020-07-04 ENCOUNTER — Other Ambulatory Visit: Payer: Self-pay | Admitting: Internal Medicine

## 2020-07-05 ENCOUNTER — Encounter: Payer: Self-pay | Admitting: Internal Medicine

## 2020-07-05 DIAGNOSIS — E0822 Diabetes mellitus due to underlying condition with diabetic chronic kidney disease: Secondary | ICD-10-CM | POA: Insufficient documentation

## 2020-07-05 NOTE — Progress Notes (Signed)
History of Present Illness:       This very nice 74 y.o.  DWM  [place] decent presents for 3 month follow up with HTN, HLD, T2_NIDDM and Vitamin D Deficiency.  Patient is followed by Dr Benay Spice for Hemochromatosis & periodic phlebotomies.  Patient has Gout controlled with his meds. Patient also has hx/o OSA on CPAP with improved sleep hygiene.  Patient's daughter [name] used to supervise his meds & monitors his Diabetes, but apparently she has moved to [place] and there is no one monitoring his meds, etc.   He relates that he has not been monitoring his diabetes as he has not had strips for his monitor. .       Patient is treated for HTN  (1970) & BP has been controlled at home. Today's BP is at goal - 128/66. Patient has had no complaints of any cardiac type chest pain, palpitations, dyspnea / orthopnea / PND, dizziness, claudication, or dependent edema.       Hyperlipidemia is controlled with diet & Atorvastatin. Patient denies myalgias or other med SE's. Last Lipids were at goal:   Lab Results  Component Value Date   CHOL 117 04/02/2020   HDL 46 04/02/2020   LDLCALC 56 04/02/2020   TRIG 73 04/02/2020   CHOLHDL 2.5 04/02/2020    Also, the patient has history of T2_NIDDM since 2006 with CKD2  (GFR 71). Patient is on tid Novolin 70/30 , today relating that he take 40 units in the morning with breakfast , takes 35 units about noon and that he takes 20 units at bedtime and occasionally increase or decreases the amount of Insulin relative to his perceived food intake despite not checking sugars.   He is advised that a new monitor & strips are sent into the drugstore to monitor his sugars 3 x /day before meals or before insulin shots.   Patient has had no symptoms of reactive hypoglycemia, diabetic polys, paresthesias or visual blurring.  Last A1c was not at goal:  Lab Results  Component Value Date   HGBA1C 7.6 (H) 04/02/2020       Further, the patient also has history of  Vitamin D Deficiency. Last vitamin D was low (goal 70-100):  Lab Results  Component Value Date   VD25OH 43 12/17/2019    Current Outpatient Medications on File Prior to Visit  Medication Sig  . allopurinol (ZYLOPRIM) 300 MG tablet Take    1 tablet     Daily      to Prevent Gout  . amLODipine (NORVASC) 10 MG tablet TAKE 1 TABLET BY MOUTH AT  NIGHT FOR BLOOD PRESSURE  . aspirin 81 MG tablet Take 81 mg by mouth daily.    Marland Kitchen atorvastatin (LIPITOR) 20 MG tablet Take    1 tablet    Daily    For Cholesterol  . bisoprolol-hydrochlorothiazide (ZIAC) 10-6.25 MG tablet TAKE 1 TABLET BY MOUTH  DAILY FOR BLOOD PRESSURE  . Cholecalciferol (VITAMIN D PO) Take 5,000 Units by mouth daily.  . Continuous Blood Gluc Sensor (FREESTYLE LIBRE 14 DAY SENSOR) MISC Apply new sensor every 14 days.  . diphenhydramine-acetaminophen (TYLENOL PM) 25-500 MG TABS tablet Take 1 tablet by mouth at bedtime as needed.   . famotidine (PEPCID) 40 MG tablet Takes 1 tablet daily for indigestion and heart burn.  . furosemide (LASIX) 40 MG tablet Take    1 tablet     Daily    For BP, / Fluid  Retention / Ankle Swelling  . glipiZIDE (GLUCOTROL) 10 MG tablet Take 1 tablet 3 x /day with Meals for Diabetes  . glucose blood test strip Use as instructed  . insulin isophane & regular human (HUMULIN 70/30 MIX) (70-30) 100 UNIT/ML KwikPen Inject into subcutaneous skin 76min before meals, 45units breakfast, 40units lunch and 22units dinner.  . Insulin Pen Needle 31G X 8 MM MISC Use one three times a day and as needed [place]  . Loratadine (CLARITIN PO) Take by mouth daily.    . magnesium oxide (MAG-OX) 400 MG tablet Take 400 mg by mouth 2 (two) times daily.   . metFORMIN (GLUCOPHAGE-XR) 500 MG 24 hr tablet Take    2 tablets    2 x /day    With Meals for Diabetes  . minoxidil (LONITEN) 10 MG tablet Take 1 tablet Daily for BP  . montelukast (SINGULAIR) 10 MG tablet Take 1    Tablet     Daily    for     Allergies  . olmesartan (BENICAR) 40 MG tablet  Take 1 tablet Daily fr BP  . tadalafil (CIALIS) 20 MG tablet Take 1/2 to 1  tablet every 2 to 3 days if needed for XXXX  . tamsulosin (FLOMAX) 0.4 MG CAPS capsule Take 1 tablet at Bedtime for Prostate   No current facility-administered medications on file prior to visit.    No Known Allergies  PMHx:   Past Medical History:  Diagnosis Date  . Allergy   . Colon polyp   . Diabetes mellitus without complication ([place])   . Dilated cardiomyopathy ([place])    non isch  . Gout   . Hemochromatosis   . Hypertension   . OSA (obstructive sleep apnea)     Immunization History  Administered Date(s) Administered  . DT (Pediatric) 01/21/2014  . Fluad Quad(high Dose 65+) 08/09/2019  . Influenza Split 09/11/2013  . Influenza, High Dose Seasonal PF 07/27/2017  . Influenza,inj,Quad PF,6+ Mos 07/28/2014, 08/06/2018  . Influenza-Unspecified 08/21/2012  . PFIZER SARS-COV-2 Vaccination 12/19/2019, 01/14/2020  . Pneumococcal Conjugate-13 09/11/2014  . Pneumococcal-Unspecified 08/21/2012    Past Surgical History:  Procedure Laterality Date  . EYE SURGERY Bilateral    Cataract    FHx:    Reviewed / unchanged  SHx:    Reviewed / unchanged   Systems Review:  Constitutional: Denies fever, chills, wt changes, headaches, insomnia, fatigue, night sweats, change in appetite. Eyes: Denies redness, blurred vision, diplopia, discharge, itchy, watery eyes.  ENT: Denies discharge, congestion, post nasal drip, epistaxis, sore throat, earache, hearing loss, dental pain, tinnitus, vertigo, sinus pain, snoring.  CV: Denies chest pain, palpitations, irregular heartbeat, syncope, dyspnea, diaphoresis, orthopnea, PND, claudication or edema. Respiratory: denies cough, dyspnea, DOE, pleurisy, hoarseness, laryngitis, wheezing.  Gastrointestinal: Denies dysphagia, odynophagia, heartburn, reflux, water brash, abdominal pain or cramps, nausea, vomiting, bloating, diarrhea, constipation, hematemesis, melena,  hematochezia  or hemorrhoids. Genitourinary: Denies dysuria, frequency, urgency, nocturia, hesitancy, discharge, hematuria or flank pain. Musculoskeletal: Denies arthralgias, myalgias, stiffness, jt. swelling, pain, limping or strain/sprain.  Skin: Denies pruritus, rash, hives, warts, acne, eczema or change in skin lesion(s). Neuro: No weakness, tremor, incoordination, spasms, paresthesia or pain. Psychiatric: Denies confusion, memory loss or sensory loss. Endo: Denies change in weight, skin or hair change.  Heme/Lymph: No excessive bleeding, bruising or enlarged lymph nodes.  Physical Exam  BP 128/66   Pulse 64   Temp (!) 97.5 F (36.4 C)   Resp 18   Ht 5\' 6"  (1.676 m)  Wt 221 lb 6.4 oz (100.4 kg)   BMI 35.73 kg/m   Appears over nourished, well groomed  and in no distress.  Eyes: PERRLA, EOMs, conjunctiva no swelling or erythema. Sinuses: No frontal/maxillary tenderness ENT/Mouth: EAC's clear, TM's nl w/o erythema, bulging. Nares clear w/o erythema, swelling, exudates. Oropharynx clear without erythema or exudates. Oral hygiene is good. Tongue normal, non obstructing. Hearing intact.  Neck: Supple. Thyroid not palpable. Car 2+/2+ without bruits, nodes or JVD. Chest: Respirations nl with BS clear & equal w/o rales, rhonchi, wheezing or stridor.  Cor: Heart sounds normal w/ regular rate and rhythm without sig. murmurs, gallops, clicks or rubs. Peripheral pulses normal and equal  without edema.  Abdomen: Soft, protuberant & bowel sounds normal. Non-tender w/o guarding, rebound, hernias, masses or organomegaly.  Lymphatics: Unremarkable.  Musculoskeletal: Full ROM all peripheral extremities, joint stability, 5/5 strength and normal gait.  Skin: Warm, dry without exposed rashes, lesions or ecchymosis apparent.  Neuro: Cranial nerves intact, reflexes equal bilaterally. Sensory-motor testing grossly intact. Tendon reflexes grossly intact.  Pysch: Alert & oriented x 3.  Insight and  judgement are limited. No ideations.  Assessment and Plan:  1. Essential hypertension  - Continue medication, monitor blood pressure at home.  - Continue DASH diet.  Reminder to go to the ER if any CP,  SOB, nausea, dizziness, severe HA, changes vision/speech.  - CBC with Differential/Platelet - COMPLETE METABOLIC PANEL WITH GFR - Magnesium - TSH  2. Hyperlipidemia associated with type 2 diabetes mellitus ([place])  - Continue diet/meds, exercise,& lifestyle modifications.  - Continue monitor periodic cholesterol/liver & renal functions   - Lipid panel - TSH  3. Diabetes mellitus due to underlying condition with stage 2  chronic kidney disease, with long-term current use of insulin (HCC)  - Continue diet, exercise  - Lifestyle modifications.  - Monitor appropriate labs.  - Hemoglobin A1c  4. Vitamin D deficiency  - Continue supplementation.  - VITAMIN D 25 Hydroxy  5. Uncontrolled type 2 diabetes mellitus with hyperglycemia (HCC)  - Hemoglobin A1c  6. Gastroesophageal reflux disease  - CBC with Differential/Platelet  7. OSA (obstructive sleep apnea)   8. Idiopathic gout  - Uric acid  9. Medication management  - CBC with Differential/Platelet - COMPLETE METABOLIC PANEL WITH GFR - Magnesium - Lipid panel - TSH - Hemoglobin A1c - VITAMIN D 25 Hydroxy - Uric acid        Discussed  regular exercise, BP monitoring, weight control to achieve/maintain BMI less than 25 and discussed med and SE's. Recommended labs to assess and monitor clinical status with further disposition pending results of labs.  I discussed the assessment and treatment plan with the patient. The patient was provided an opportunity to ask questions and all were answered. The patient agreed with the plan and demonstrated an understanding of the instructions.  I provided over 30 minutes of exam, counseling, chart review and  complex critical decision making.   Kirtland Bouchard, MD

## 2020-07-05 NOTE — Patient Instructions (Signed)

## 2020-07-06 ENCOUNTER — Other Ambulatory Visit: Payer: Self-pay

## 2020-07-06 ENCOUNTER — Ambulatory Visit (INDEPENDENT_AMBULATORY_CARE_PROVIDER_SITE_OTHER): Payer: Medicare Other | Admitting: Internal Medicine

## 2020-07-06 VITALS — BP 128/66 | HR 64 | Temp 97.5°F | Resp 18 | Ht 66.0 in | Wt 221.4 lb

## 2020-07-06 DIAGNOSIS — E1165 Type 2 diabetes mellitus with hyperglycemia: Secondary | ICD-10-CM | POA: Diagnosis not present

## 2020-07-06 DIAGNOSIS — I10 Essential (primary) hypertension: Secondary | ICD-10-CM | POA: Diagnosis not present

## 2020-07-06 DIAGNOSIS — E0822 Diabetes mellitus due to underlying condition with diabetic chronic kidney disease: Secondary | ICD-10-CM | POA: Diagnosis not present

## 2020-07-06 DIAGNOSIS — E559 Vitamin D deficiency, unspecified: Secondary | ICD-10-CM

## 2020-07-06 DIAGNOSIS — M1000 Idiopathic gout, unspecified site: Secondary | ICD-10-CM

## 2020-07-06 DIAGNOSIS — Z79899 Other long term (current) drug therapy: Secondary | ICD-10-CM

## 2020-07-06 DIAGNOSIS — G4733 Obstructive sleep apnea (adult) (pediatric): Secondary | ICD-10-CM | POA: Diagnosis not present

## 2020-07-06 DIAGNOSIS — E1169 Type 2 diabetes mellitus with other specified complication: Secondary | ICD-10-CM

## 2020-07-06 DIAGNOSIS — K219 Gastro-esophageal reflux disease without esophagitis: Secondary | ICD-10-CM

## 2020-07-06 DIAGNOSIS — Z794 Long term (current) use of insulin: Secondary | ICD-10-CM | POA: Diagnosis not present

## 2020-07-06 DIAGNOSIS — N182 Chronic kidney disease, stage 2 (mild): Secondary | ICD-10-CM | POA: Diagnosis not present

## 2020-07-06 DIAGNOSIS — E785 Hyperlipidemia, unspecified: Secondary | ICD-10-CM | POA: Diagnosis not present

## 2020-07-06 MED ORDER — TADALAFIL 20 MG PO TABS
ORAL_TABLET | ORAL | 3 refills | Status: DC
Start: 2020-07-06 — End: 2021-06-16

## 2020-07-07 ENCOUNTER — Other Ambulatory Visit: Payer: Self-pay | Admitting: *Deleted

## 2020-07-07 LAB — CBC WITH DIFFERENTIAL/PLATELET
Absolute Monocytes: 646 cells/uL (ref 200–950)
Basophils Absolute: 58 cells/uL (ref 0–200)
Basophils Relative: 0.9 %
Eosinophils Absolute: 237 cells/uL (ref 15–500)
Eosinophils Relative: 3.7 %
HCT: 38.4 % — ABNORMAL LOW (ref 38.5–50.0)
Hemoglobin: 13.6 g/dL (ref 13.2–17.1)
Lymphs Abs: 1824 cells/uL (ref 850–3900)
MCH: 31.8 pg (ref 27.0–33.0)
MCHC: 35.4 g/dL (ref 32.0–36.0)
MCV: 89.7 fL (ref 80.0–100.0)
MPV: 9.6 fL (ref 7.5–12.5)
Monocytes Relative: 10.1 %
Neutro Abs: 3635 cells/uL (ref 1500–7800)
Neutrophils Relative %: 56.8 %
Platelets: 216 10*3/uL (ref 140–400)
RBC: 4.28 10*6/uL (ref 4.20–5.80)
RDW: 13.4 % (ref 11.0–15.0)
Total Lymphocyte: 28.5 %
WBC: 6.4 10*3/uL (ref 3.8–10.8)

## 2020-07-07 LAB — COMPLETE METABOLIC PANEL WITH GFR
AG Ratio: 2.1 (calc) (ref 1.0–2.5)
ALT: 29 U/L (ref 9–46)
AST: 20 U/L (ref 10–35)
Albumin: 4.4 g/dL (ref 3.6–5.1)
Alkaline phosphatase (APISO): 42 U/L (ref 35–144)
BUN/Creatinine Ratio: 23 (calc) — ABNORMAL HIGH (ref 6–22)
BUN: 27 mg/dL — ABNORMAL HIGH (ref 7–25)
CO2: 29 mmol/L (ref 20–32)
Calcium: 9.5 mg/dL (ref 8.6–10.3)
Chloride: 100 mmol/L (ref 98–110)
Creat: 1.15 mg/dL (ref 0.70–1.18)
GFR, Est African American: 73 mL/min/{1.73_m2} (ref >=60)
GFR, Est Non African American: 63 mL/min/{1.73_m2} (ref >=60)
Globulin: 2.1 g/dL (calc) (ref 1.9–3.7)
Glucose, Bld: 143 mg/dL — ABNORMAL HIGH (ref 65–99)
Potassium: 3.9 mmol/L (ref 3.5–5.3)
Sodium: 138 mmol/L (ref 135–146)
Total Bilirubin: 0.6 mg/dL (ref 0.2–1.2)
Total Protein: 6.5 g/dL (ref 6.1–8.1)

## 2020-07-07 LAB — URIC ACID: Uric Acid, Serum: 6.3 mg/dL (ref 4.0–8.0)

## 2020-07-07 LAB — HEMOGLOBIN A1C
Hgb A1c MFr Bld: 8.7 % of total Hgb — ABNORMAL HIGH (ref <5.7)
Mean Plasma Glucose: 203 (calc)
eAG (mmol/L): 11.2 (calc)

## 2020-07-07 LAB — LIPID PANEL
Cholesterol: 115 mg/dL (ref <200)
HDL: 35 mg/dL — ABNORMAL LOW (ref >=40)
LDL Cholesterol (Calc): 56 mg/dL (calc)
Non-HDL Cholesterol (Calc): 80 mg/dL (calc) (ref <130)
Total CHOL/HDL Ratio: 3.3 (calc) (ref <5.0)
Triglycerides: 162 mg/dL — ABNORMAL HIGH (ref <150)

## 2020-07-07 LAB — VITAMIN D 25 HYDROXY (VIT D DEFICIENCY, FRACTURES): Vit D, 25-Hydroxy: 64 ng/mL (ref 30–100)

## 2020-07-07 LAB — TSH: TSH: 2.91 mIU/L (ref 0.40–4.50)

## 2020-07-07 LAB — MAGNESIUM: Magnesium: 2.1 mg/dL (ref 1.5–2.5)

## 2020-07-07 MED ORDER — LANCETS ULTRA FINE MISC: 3 refills | Status: DC

## 2020-07-07 MED ORDER — IGLUCOSE TEST STRIPS VI STRP: ORAL_STRIP | 12 refills | Status: DC

## 2020-07-07 MED ORDER — BLOOD GLUCOSE MONITORING SUPPL DEVI: 0 refills | Status: DC

## 2020-07-07 NOTE — Progress Notes (Signed)
========================================================== - Test results slightly outside the reference range are not unusual. If there is anything important, I will review this with you,  otherwise it is considered normal test values.  If you have further questions,  please do not hesitate to contact me at the office or via My Chart.  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   - It is very important that you work harder with diet by  avoiding all foods that are white except chicken,   fish & calliflower.  - Avoid white rice  (brown & wild rice is OK),   - Avoid white potatoes  (sweet potatoes in moderation is OK),   White bread or wheat bread or anything made out of   white flour like bagels, donuts, rolls, buns, biscuits, cakes,  - pastries, cookies, pizza crust, and pasta (made from  white flour & egg whites)   - vegetarian pasta or spinach or wheat pasta is OK.  - Multigrain breads like Arnold's, Pepperidge Farm or   multigrain sandwich thins or high fiber breads like   [place] bread or "Dave's Killer" breads that are  4 to 5 grams fiber per slice !  are best.    Diet, exercise and weight loss can reverse and cure  diabetes in the early stages.    - Diet, exercise and weight loss is very important in the    control and prevention of complications of diabetes which  affects every system in your body, ie.   -Brain - dementia/stroke,  - eyes - glaucoma/blindness,  - heart - heart attack/heart failure,  - kidneys - dialysis,  - stomach - gastric paralysis,  - intestines - malabsorption,  - nerves - severe painful neuritis,  - circulation - gangrene & loss of a leg(s)  - and finally  . . . . . . . . . . . . . . . . . .    - cancer and Alzheimers. ==========================================================  -  Vitamin D = 66 - Great  - Keep dose same   - Vitamin D goal is between 60-100.   - Please make sure that you are taking your Vitamin D as directed.   - It is very important as a natural anti-inflammatory and helping the  immune system protect against viral infections, like the Covid-19    helping hair, skin, and nails, as well as reducing stroke and heart attack risk.   - It helps your bones and helps with mood.  - It also decreases numerous cancer risks so please take it as directed.   - Low Vit D is associated with a 200-300% higher risk for CANCER   and 200-300% higher risk for HEART   ATTACK  &  STROKE.    - It is also associated with higher death rate at younger ages,  autoimmune diseases like Rheumatoid arthritis, Lupus,  Multiple Sclerosis.     - Also many other serious conditions, like depression, Alzheimer's  Dementia, infertility, muscle aches, fatigue, fibromyalgia  - just to name a few.  ========================================================  - Uric Acid / Gout Test is Normal / OK  ==========================================================  -  All Else - CBC - Electrolytes - Liver - Magnesium & Thyroid    - all  Normal / OK ==========================================================

## 2020-07-14 ENCOUNTER — Encounter: Payer: Self-pay | Admitting: *Deleted

## 2020-07-24 ENCOUNTER — Other Ambulatory Visit: Payer: Self-pay | Admitting: Internal Medicine

## 2020-08-10 ENCOUNTER — Inpatient Hospital Stay: Payer: Medicare Other | Attending: Oncology | Admitting: Oncology

## 2020-08-10 ENCOUNTER — Inpatient Hospital Stay: Payer: Medicare Other

## 2020-08-13 ENCOUNTER — Telehealth: Payer: Self-pay | Admitting: Oncology

## 2020-08-13 NOTE — Telephone Encounter (Signed)
Rescheduled appointments per 10/20 sch msg. Spoke to patient who is aware of appointments.

## 2020-08-16 ENCOUNTER — Other Ambulatory Visit: Payer: Self-pay | Admitting: Internal Medicine

## 2020-08-16 ENCOUNTER — Other Ambulatory Visit: Payer: Self-pay | Admitting: Adult Health

## 2020-08-16 DIAGNOSIS — I10 Essential (primary) hypertension: Secondary | ICD-10-CM

## 2020-08-17 ENCOUNTER — Other Ambulatory Visit: Payer: Self-pay | Admitting: Internal Medicine

## 2020-09-03 ENCOUNTER — Inpatient Hospital Stay: Payer: Medicare Other

## 2020-09-03 ENCOUNTER — Inpatient Hospital Stay: Payer: Medicare Other | Attending: Oncology

## 2020-09-03 ENCOUNTER — Other Ambulatory Visit: Payer: Self-pay

## 2020-09-03 ENCOUNTER — Inpatient Hospital Stay (HOSPITAL_BASED_OUTPATIENT_CLINIC_OR_DEPARTMENT_OTHER): Payer: Medicare Other | Admitting: Oncology

## 2020-09-03 ENCOUNTER — Telehealth: Payer: Self-pay | Admitting: Oncology

## 2020-09-03 VITALS — BP 139/74 | HR 67 | Temp 97.8°F | Resp 20 | Ht 66.0 in | Wt 224.7 lb

## 2020-09-03 DIAGNOSIS — E119 Type 2 diabetes mellitus without complications: Secondary | ICD-10-CM | POA: Insufficient documentation

## 2020-09-03 DIAGNOSIS — E7800 Pure hypercholesterolemia, unspecified: Secondary | ICD-10-CM | POA: Diagnosis not present

## 2020-09-03 DIAGNOSIS — I10 Essential (primary) hypertension: Secondary | ICD-10-CM | POA: Insufficient documentation

## 2020-09-03 DIAGNOSIS — Z23 Encounter for immunization: Secondary | ICD-10-CM | POA: Diagnosis not present

## 2020-09-03 DIAGNOSIS — Z794 Long term (current) use of insulin: Secondary | ICD-10-CM | POA: Diagnosis not present

## 2020-09-03 DIAGNOSIS — Z87891 Personal history of nicotine dependence: Secondary | ICD-10-CM | POA: Insufficient documentation

## 2020-09-03 DIAGNOSIS — G4730 Sleep apnea, unspecified: Secondary | ICD-10-CM | POA: Insufficient documentation

## 2020-09-03 DIAGNOSIS — M109 Gout, unspecified: Secondary | ICD-10-CM | POA: Insufficient documentation

## 2020-09-03 LAB — FERRITIN: Ferritin: 56 ng/mL (ref 24–336)

## 2020-09-03 MED ORDER — INFLUENZA VAC A&B SA ADJ QUAD 0.5 ML IM PRSY
PREFILLED_SYRINGE | INTRAMUSCULAR | Status: AC
  Filled 2020-09-03: qty 0.5

## 2020-09-03 MED ORDER — INFLUENZA VAC A&B SA ADJ QUAD 0.5 ML IM PRSY: 0.5000 mL | PREFILLED_SYRINGE | Freq: Once | INTRAMUSCULAR | Status: DC

## 2020-09-03 NOTE — Progress Notes (Signed)
   Covid-19 Vaccination Clinic  Name:  Brett [name]    MRN: [id number] DOB: 07/24/1946  09/03/2020  Brett [name] was observed post Covid-19 immunization for 15 minutes without incident. He was provided with Vaccine Information Sheet and instruction to access the V-Safe system.   Brett [name] was instructed to call 911 with any severe reactions post vaccine: Marland Kitchen Difficulty breathing  . Swelling of face and throat  . A fast heartbeat  . A bad rash all over body  . Dizziness and weakness

## 2020-09-03 NOTE — Progress Notes (Signed)
  [facility] OFFICE PROGRESS NOTE   Diagnosis: Hemochromatosis  INTERVAL HISTORY:   Brett [name] returns as scheduled.  He last underwent phlebotomy therapy in November 2020.  He is concerned about weight gain.  He is establishing with a new primary provider.  He relates the weight gain to taking insulin.  He has received the COVID-19 vaccine.  Objective:  Vital signs in last 24 hours:  Blood pressure 139/74, pulse 67, temperature 97.8 F (36.6 C), temperature source Tympanic, resp. rate 20, height 5\' 6"  (1.676 m), weight 224 lb 11.2 oz (101.9 kg), SpO2 98 %.     Resp: Lungs clear bilaterally Cardio: Regular rate and rhythm, distant heart sounds GI: No hepatosplenomegaly Vascular: Trace pitting edema at the lower leg bilaterally   Lab Results:  Lab Results  Component Value Date   WBC 6.4 07/06/2020   HGB 13.6 07/06/2020   HCT 38.4 (L) 07/06/2020   MCV 89.7 07/06/2020   PLT 216 07/06/2020   NEUTROABS 3,635 07/06/2020    CMP  Lab Results  Component Value Date   NA 138 07/06/2020   K 3.9 07/06/2020   CL 100 07/06/2020   CO2 29 07/06/2020   GLUCOSE 143 (H) 07/06/2020   BUN 27 (H) 07/06/2020   CREATININE 1.15 07/06/2020   CALCIUM 9.5 07/06/2020   PROT 6.5 07/06/2020   ALBUMIN 4.6 05/03/2017   AST 20 07/06/2020   ALT 29 07/06/2020   ALKPHOS 36 (L) 05/03/2017   BILITOT 0.6 07/06/2020   GFRNONAA 63 07/06/2020   GFRAA 73 07/06/2020  04/08/2020: Ferritin-34  Medications: I have reviewed the patient's current medications.   Assessment/Plan: 1. 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 He was last treated with phlebotomy therapy on 09/16/2019 2. History of tobacco use in the remote past. 3. Hypertension. 4. Hypercholesterolemia. 5. Diabetes. 6. Gout. 7. Sleep apnea. 8. Family history of hemochromatosis.    Disposition: Mr. Tamburrino appears stable.  We will follow up on the ferritin level from today and recommend  phlebotomy therapy as indicated.  He will return in 4 months and 8 months for a ferritin level.  He will be scheduled for 1 year office visit.  Mr. Scheaffer received a COVID-19 booster vaccine today.  I recommended he obtain an influenza vaccine in a few weeks.  Betsy Coder, MD  09/03/2020  12:24 PM

## 2020-09-03 NOTE — Telephone Encounter (Signed)
Scheduled per 11/11 los. Printed appt calendar for pt.

## 2020-09-03 NOTE — Progress Notes (Signed)
Reports he is establishing with new PCP on 09/16/20-Natalie Clovis Riley

## 2020-09-04 ENCOUNTER — Telehealth: Payer: Self-pay | Admitting: *Deleted

## 2020-09-04 NOTE — Telephone Encounter (Signed)
Notified of ferritin results in goal range. F/U as scheduled.

## 2020-09-04 NOTE — Telephone Encounter (Signed)
-----   Message from [name], MD sent at 09/03/2020  1:11 PM EST ----- Please call patient, ferritin level remains in goal range, follow-up as scheduled

## 2020-09-16 ENCOUNTER — Ambulatory Visit (INDEPENDENT_AMBULATORY_CARE_PROVIDER_SITE_OTHER): Payer: Medicare Other | Admitting: Family Medicine

## 2020-09-16 ENCOUNTER — Encounter: Payer: Self-pay | Admitting: Family Medicine

## 2020-09-16 ENCOUNTER — Other Ambulatory Visit: Payer: Self-pay

## 2020-09-16 VITALS — BP 137/67 | HR 64 | Temp 97.8°F | Resp 16 | Ht 65.0 in | Wt 226.0 lb

## 2020-09-16 DIAGNOSIS — E559 Vitamin D deficiency, unspecified: Secondary | ICD-10-CM | POA: Diagnosis not present

## 2020-09-16 DIAGNOSIS — I10 Essential (primary) hypertension: Secondary | ICD-10-CM | POA: Diagnosis not present

## 2020-09-16 DIAGNOSIS — E1169 Type 2 diabetes mellitus with other specified complication: Secondary | ICD-10-CM | POA: Diagnosis not present

## 2020-09-16 DIAGNOSIS — E785 Hyperlipidemia, unspecified: Secondary | ICD-10-CM | POA: Diagnosis not present

## 2020-09-16 DIAGNOSIS — Z0000 Encounter for general adult medical examination without abnormal findings: Secondary | ICD-10-CM

## 2020-09-16 DIAGNOSIS — E1165 Type 2 diabetes mellitus with hyperglycemia: Secondary | ICD-10-CM

## 2020-09-16 DIAGNOSIS — Z09 Encounter for follow-up examination after completed treatment for conditions other than malignant neoplasm: Secondary | ICD-10-CM | POA: Diagnosis not present

## 2020-09-16 DIAGNOSIS — Z23 Encounter for immunization: Secondary | ICD-10-CM | POA: Diagnosis not present

## 2020-09-16 DIAGNOSIS — E538 Deficiency of other specified B group vitamins: Secondary | ICD-10-CM

## 2020-09-16 DIAGNOSIS — R350 Frequency of micturition: Secondary | ICD-10-CM

## 2020-09-16 DIAGNOSIS — R739 Hyperglycemia, unspecified: Secondary | ICD-10-CM | POA: Diagnosis not present

## 2020-09-16 DIAGNOSIS — R7309 Other abnormal glucose: Secondary | ICD-10-CM | POA: Diagnosis not present

## 2020-09-16 LAB — POCT GLYCOSYLATED HEMOGLOBIN (HGB A1C)
HbA1c POC (<> result, manual entry): 10 % (ref 4.0–5.6)
HbA1c, POC (controlled diabetic range): 10 % — AB (ref 0.0–7.0)
HbA1c, POC (prediabetic range): 10 % — AB (ref 5.7–6.4)
Hemoglobin A1C: 10 % — AB (ref 4.0–5.6)

## 2020-09-16 LAB — POCT URINALYSIS DIPSTICK
Bilirubin, UA: NEGATIVE
Blood, UA: NEGATIVE
Glucose, UA: POSITIVE — AB
Ketones, UA: NEGATIVE
Leukocytes, UA: NEGATIVE
Nitrite, UA: NEGATIVE
Protein, UA: NEGATIVE
Spec Grav, UA: 1.025 (ref 1.010–1.025)
Urobilinogen, UA: 0.2 E.U./dL
pH, UA: 5 (ref 5.0–8.0)

## 2020-09-16 LAB — GLUCOSE, POCT (MANUAL RESULT ENTRY): POC Glucose: 377 mg/dl — AB (ref 70–99)

## 2020-09-16 NOTE — Progress Notes (Signed)
Patient [name]    Established Patient Office Visit  Subjective:  Patient ID: Brett [name], male    DOB: 09/11/1946  Age: 74 y.o. MRN: [id number]  CC:  Chief Complaint  Patient presents with  . Establish Care    HPI Brett [name] is a 74 year old male who presents to [facility] today.    Patient Active Problem List   Diagnosis Date Noted  . Diabetes mellitus due to underlying condition with stage 2 chronic kidney disease, with long-term current use of insulin ([place]) 07/05/2020  . Gastroesophageal reflux disease 11/23/2018  . Former smoker 08/15/2018  . FHx: heart disease 08/15/2018  . Diabetic retinopathy ([place]) 08/16/2017  . Type 2 diabetes mellitus with stage 2 chronic kidney disease, with long-term current use of insulin ([place]) 08/08/2017  . Gluttony 05/06/2017  . Obesity (BMI 30.0-34.9) 09/17/2015  . Hyperlipidemia associated with type 2 diabetes mellitus ([place]) 01/21/2014  . Vitamin D deficiency 01/21/2014  . Medication management 01/21/2014  . CKD stage 2 due to type 2 diabetes mellitus ([place])   . Hypertension   . OSA (obstructive sleep apnea)   . Gout   . Dilated cardiomyopathy ([place])   . Hemochromatosis 09/27/2011   Current Status: [name] with me. He was previously seeing Dr. Melford Aase for his PCP needs. He continues to see Dr. Benay Spice for Hemochromatosis. He is accompanied today by his granddaughter. Since his last office visit, he is doing well with no complaints. He denies fevers, chills, fatigue, recent infections, weight loss, and night sweats. He has not had any headaches, visual changes, dizziness, and falls. No chest pain, heart palpitations, cough and shortness of breath reported. No reports of GI problems such as nausea, vomiting, diarrhea, and constipation. He has no reports of blood in stools, dysuria and hematuria. No depression or anxiety reported today. He is taking all  medications as prescribed. He denies fatigue, frequent urination, blurred vision, excessive hunger, excessive thirst, weight gain, weight loss, and poor wound healing. He continues to check his feet regularly. He denies visual changes, chest pain, cough, shortness of breath, heart palpitations, and falls. He has occasional headaches and dizziness with position changes. Denies severe headaches, confusion, seizures, double vision, and blurred vision, nausea and vomiting. He denies fevers, chills, recent infections, weight loss, and night sweats. Denies GI problems such as nausea, vomiting, diarrhea, and constipation. He has no reports of blood in stools, dysuria and hematuria. No depression or anxiety reported today. He is taking all medications as prescribed. He denies pain today.   Past Medical History:  Diagnosis Date  . Acid reflux   . Allergy   . Colon polyp   . Diabetes mellitus without complication ([place])   . Dilated cardiomyopathy ([place])    non isch  . Gout   . Gout   . Hemochromatosis   . Hypertension   . OSA (obstructive sleep apnea)   . Urine frequency     Past Surgical History:  Procedure Laterality Date  . EYE SURGERY Bilateral    Cataract    Family History  Problem Relation Age of Onset  . Diabetes Father   . Diabetes Other   . Hypertension Other   . Hyperlipidemia Other   . Heart disease Other   . Cancer Maternal Uncle        brain  . Heart attack Brother        58  .  Diabetes Brother     Social History   Socioeconomic History  . Marital status: Divorced    Spouse name: Not on file  . Number of children: Not on file  . Years of education: Not on file  . Highest education level: Not on file  Occupational History  . Not on file  Tobacco Use  . Smoking status: Former Smoker    Packs/day: 3.00    Years: 19.00    Pack years: 57.00    Types: Cigarettes    Start date: 10/24/1957    Quit date: 11/24/1976    Years since quitting: 43.8  . Smokeless tobacco: Never  Used  Substance and Sexual Activity  . Alcohol use: Yes    Comment: Occasionally  . Drug use: No  . Sexual activity: Not on file  Other Topics Concern  . Not on file  Social History Narrative  . Not on file   Social Determinants of Health   Financial Resource Strain:   . Difficulty of Paying Living Expenses: Not on file  Food Insecurity:   . Worried About Charity fundraiser in the Last Year: Not on file  . Ran Out of Food in the Last Year: Not on file  Transportation Needs:   . Lack of Transportation (Medical): Not on file  . Lack of Transportation (Non-Medical): Not on file  Physical Activity:   . Days of Exercise per Week: Not on file  . Minutes of Exercise per Session: Not on file  Stress:   . Feeling of Stress : Not on file  Social Connections:   . Frequency of Communication with Friends and Family: Not on file  . Frequency of Social Gatherings with Friends and Family: Not on file  . Attends Religious Services: Not on file  . Active Member of Clubs or Organizations: Not on file  . Attends Archivist Meetings: Not on file  . Marital Status: Not on file  Intimate Partner Violence:   . Fear of Current or Ex-Partner: Not on file  . Emotionally Abused: Not on file  . Physically Abused: Not on file  . Sexually Abused: Not on file    Outpatient Medications Prior to Visit  Medication Sig Dispense Refill  . allopurinol (ZYLOPRIM) 300 MG tablet TAKE 1 TABLET BY MOUTH  DAILY TO PREVENT GOUT 90 tablet 3  . amLODipine (NORVASC) 10 MG tablet Take      1 tablet      at Night        for BP 90 tablet 0  . aspirin 81 MG tablet Take 81 mg by mouth daily.      Marland Kitchen atorvastatin (LIPITOR) 20 MG tablet TAKE 1 TABLET BY MOUTH  DAILY FOR CHOLESTEROL 90 tablet 3  . bisoprolol-hydrochlorothiazide (ZIAC) 10-6.25 MG tablet TAKE 1 TABLET BY MOUTH  DAILY FOR BLOOD PRESSURE 90 tablet 3  . Blood Glucose Monitoring Suppl DEVI Check blood sugar 3 times a day-DX-E11.22 1 each 0  .  Cholecalciferol (VITAMIN D PO) Take 5,000 Units by mouth daily.    . Continuous Blood Gluc Sensor (FREESTYLE LIBRE 14 DAY SENSOR) MISC Apply new sensor every 14 days. 1 each 11  . furosemide (LASIX) 40 MG tablet TAKE 1 TABLET BY MOUTH  DAILY FOR BP, / FLUID  RETENTION / ANKLE SWELLING 90 tablet 3  . glipiZIDE (GLUCOTROL) 10 MG tablet TAKE 1 TABLET BY MOUTH 3  TIMES DAILY WITH MEALS FOR  DIABETES 270 tablet 3  . glucose blood (  IGLUCOSE TEST STRIPS) test strip Check blood sugar 3 times daily-DX-E11.22 300 each 12  . insulin isophane & regular human (HUMULIN 70/30 MIX) (70-30) 100 UNIT/ML KwikPen Inject into subcutaneous skin 68min before meals, 45units breakfast, 40units lunch and 22units dinner. (Patient taking differently: Inject into subcutaneous skin 5min before meals, 45units breakfast, 25 units lunch and 20units dinner.) 135 mL 4  . Insulin Pen Needle 31G X 8 MM MISC Use one three times a day and as needed [place] 1000 each 3  . Lancets Ultra Fine MISC Check blood sugar 3 times a day-E11.22 300 each 3  . Loratadine (CLARITIN PO) Take by mouth daily.      . magnesium oxide (MAG-OX) 400 MG tablet Take 400 mg by mouth 2 (two) times daily.     . metFORMIN (GLUCOPHAGE-XR) 500 MG 24 hr tablet Take    2 tablets    2 x /day    With Meals for Diabetes 360 tablet 3  . minoxidil (LONITEN) 10 MG tablet Take 1 tablet Daily for BP 90 tablet 0  . montelukast (SINGULAIR) 10 MG tablet TAKE 1 TABLET BY MOUTH  DAILY FOR ALLERGIES (Patient not taking: Reported on 09/03/2020) 90 tablet 3  . olmesartan (BENICAR) 40 MG tablet TAKE 1 TABLET BY MOUTH  DAILY FOR BLOOD PRESSURE 90 tablet 3  . tadalafil (CIALIS) 20 MG tablet Take 1/2 to 1  tablet every 2 to 3 days if needed for XXXX 30 tablet 3  . tamsulosin (FLOMAX) 0.4 MG CAPS capsule Take 1 tablet at Bedtime for Prostate 90 capsule 3   No facility-administered medications prior to visit.    No Known Allergies  ROS Review of Systems  Constitutional: Negative.   HENT:  Negative.   Eyes: Negative.   Respiratory: Negative.   Cardiovascular: Negative.   Gastrointestinal: Positive for abdominal distention (obese).  Endocrine: Negative.   Genitourinary: Negative.   Musculoskeletal: Positive for arthralgias (generalized joint pain).  Skin: Negative.   Allergic/Immunologic: Negative.   Neurological: Positive for dizziness (occasional ) and headaches (occasional ).  Hematological: Negative.   Psychiatric/Behavioral: Negative.     Objective:   Physical Exam Vitals and nursing note reviewed. Exam conducted with a chaperone present (Granddaughter).  Constitutional:      Appearance: Normal appearance. He is obese.  HENT:     Head: Normocephalic and atraumatic.     Nose: Nose normal.     Mouth/Throat:     Mouth: Mucous membranes are moist.     Pharynx: Oropharynx is clear.  Cardiovascular:     Rate and Rhythm: Normal rate and regular rhythm.     Pulses: Normal pulses.     Heart sounds: Normal heart sounds.  Pulmonary:     Effort: Pulmonary effort is normal.     Breath sounds: Normal breath sounds.  Abdominal:     General: Bowel sounds are normal.     Palpations: Abdomen is soft.  Musculoskeletal:        General: Normal range of motion.     Cervical back: Normal range of motion and neck supple.  Skin:    General: Skin is warm and dry.  Neurological:     General: No focal deficit present.     Mental Status: He is alert and oriented to person, place, and time.  Psychiatric:        Mood and Affect: Mood normal.        Behavior: Behavior normal.        Thought Content: Thought  content normal.        Judgment: Judgment normal.    BP 137/67 (BP Location: Left Arm, Patient Position: Sitting, Cuff Size: Large)   Pulse 64   Temp 97.8 F (36.6 C)   Resp 16   Ht 5\' 5"  (1.651 m)   Wt 226 lb (102.5 kg)   SpO2 99%   BMI 37.61 kg/m  Wt Readings from Last 3 Encounters:  09/16/20 226 lb (102.5 kg)  09/03/20 224 lb 11.2 oz (101.9 kg)  07/06/20 221  lb 6.4 oz (100.4 kg)    Health Maintenance Due  Topic Date Due  . COLONOSCOPY  08/24/2020    There are no preventive care reminders to display for this patient.  Lab Results  Component Value Date   TSH 4.820 (H) 09/16/2020   Lab Results  Component Value Date   WBC 6.6 09/16/2020   HGB 14.5 09/16/2020   HCT 41.1 09/16/2020   MCV 90 09/16/2020   PLT 188 09/16/2020   Lab Results  Component Value Date   NA 138 09/16/2020   K 4.0 09/16/2020   CO2 23 09/16/2020   GLUCOSE 285 (H) 09/16/2020   BUN 20 09/16/2020   CREATININE 1.06 09/16/2020   BILITOT 0.7 09/16/2020   ALKPHOS 54 09/16/2020   AST 17 09/16/2020   ALT 27 09/16/2020   PROT 6.8 09/16/2020   ALBUMIN 4.5 09/16/2020   CALCIUM 9.7 09/16/2020   Lab Results  Component Value Date   CHOL 115 07/06/2020   Lab Results  Component Value Date   HDL 35 (L) 07/06/2020   Lab Results  Component Value Date   LDLCALC 56 07/06/2020   Lab Results  Component Value Date   TRIG 162 (H) 07/06/2020   Lab Results  Component Value Date   CHOLHDL 3.3 07/06/2020   Lab Results  Component Value Date   HGBA1C 10.0 (A) 09/16/2020   HGBA1C 10.0 09/16/2020   HGBA1C 10.0 (A) 09/16/2020   HGBA1C 10.0 (A) 09/16/2020    Assessment & Plan:   1. Uncontrolled type 2 diabetes mellitus with hyperglycemia [facility]) Referral sent to Endocrinology for possible Insulin Pump. He will continue medication as prescribed, to decrease foods/beverages high in sugars and carbs and follow Heart Healthy or DASH diet. Increase physical activity to at least 30 minutes cardio exercise daily.  - Ambulatory referral to Endocrinology  2. Hemoglobin A1C greater than 9%, indicating poor diabetic control Hgb A1c at 10.0 today. Monitor.  - Ambulatory referral to Endocrinology  3. Hyperglycemia - Ambulatory referral to Endocrinology  4. Essential hypertension The current medical regimen is effective; blood pressure is stable at 137/67 today; continue present  plan and medications as prescribed. He will continue to take medications as prescribed, to decrease high sodium intake, excessive alcohol intake, increase potassium intake, smoking cessation, and increase physical activity of at least 30 minutes of cardio activity daily. He will continue to follow Heart Healthy or DASH diet. - CBC with Differential - Comprehensive metabolic panel - TSH - Vitamin B12  5. Hyperlipidemia associated with type 2 diabetes mellitus (HCC) - Urinalysis Dipstick - POC Glucose (CBG) - POC HgB A1c - Ambulatory referral to Endocrinology  6. Urine frequency - PSA  7. Vitamin D deficiency - Vitamin D, 25-hydroxy  8. Vitamin B 12 deficiency - Vitamin B12  9. Need for influenza vaccination - Flu vaccine HIGH DOSE PF (Fluzone High dose)  10. Healthcare maintenance  11. Follow up He will follow up inn 3 months.  No orders of the defined types were placed in this encounter.   Orders Placed This Encounter  Procedures  . Flu vaccine HIGH DOSE PF (Fluzone High dose)  . CBC with Differential  . Comprehensive metabolic panel  . PSA  . TSH  . Vitamin B12  . Vitamin D, 25-hydroxy  . Ambulatory referral to Endocrinology  . Urinalysis Dipstick  . POC Glucose (CBG)  . POC HgB A1c     Referral Orders     Ambulatory referral to Endocrinology   Kathe Becton,  MSN, FNP-BC [facility] [address] [place], [place] [address] [phone] fax  Problem List Items Addressed This Visit      Endocrine   Hyperlipidemia associated with type 2 diabetes mellitus ([place])   Relevant Orders   Urinalysis Dipstick (Completed)   POC Glucose (CBG) (Completed)   POC HgB A1c (Completed)   Ambulatory referral to Endocrinology     Other   Vitamin D deficiency   Relevant Orders   Vitamin D, 25-hydroxy (Completed)    Other Visit Diagnoses    Uncontrolled type 2  diabetes mellitus with hyperglycemia ([place])    -  Primary   Relevant Orders   Ambulatory referral to Endocrinology   Hemoglobin A1C greater than 9%, indicating poor diabetic control       Relevant Orders   Ambulatory referral to Endocrinology   Hyperglycemia       Relevant Orders   Ambulatory referral to Endocrinology   Essential hypertension       Relevant Orders   CBC with Differential (Completed)   Comprehensive metabolic panel (Completed)   TSH (Completed)   Vitamin B12 (Completed)   Urine frequency       Relevant Orders   PSA (Completed)   Vitamin B 12 deficiency       Relevant Orders   Vitamin B12 (Completed)   Need for influenza vaccination       Relevant Orders   Flu vaccine HIGH DOSE PF (Fluzone High dose) (Completed)   Healthcare maintenance       Follow up          No orders of the defined types were placed in this encounter.   Follow-up: No follow-ups on file.    Azzie Glatter, FNP

## 2020-09-17 LAB — COMPREHENSIVE METABOLIC PANEL
ALT: 27 IU/L (ref 0–44)
AST: 17 IU/L (ref 0–40)
Albumin/Globulin Ratio: 2 (ref 1.2–2.2)
Albumin: 4.5 g/dL (ref 3.7–4.7)
Alkaline Phosphatase: 54 IU/L (ref 44–121)
BUN/Creatinine Ratio: 19 (ref 10–24)
BUN: 20 mg/dL (ref 8–27)
Bilirubin Total: 0.7 mg/dL (ref 0.0–1.2)
CO2: 23 mmol/L (ref 20–29)
Calcium: 9.7 mg/dL (ref 8.6–10.2)
Chloride: 100 mmol/L (ref 96–106)
Creatinine, Ser: 1.06 mg/dL (ref 0.76–1.27)
GFR calc Af Amer: 80 mL/min/{1.73_m2} (ref >59)
GFR calc non Af Amer: 69 mL/min/{1.73_m2} (ref >59)
Globulin, Total: 2.3 g/dL (ref 1.5–4.5)
Glucose: 285 mg/dL — ABNORMAL HIGH (ref 65–99)
Potassium: 4 mmol/L (ref 3.5–5.2)
Sodium: 138 mmol/L (ref 134–144)
Total Protein: 6.8 g/dL (ref 6.0–8.5)

## 2020-09-17 LAB — CBC WITH DIFFERENTIAL/PLATELET
Basophils Absolute: 0.1 10*3/uL (ref 0.0–0.2)
Basos: 1 %
EOS (ABSOLUTE): 0.2 10*3/uL (ref 0.0–0.4)
Eos: 3 %
Hematocrit: 41.1 % (ref 37.5–51.0)
Hemoglobin: 14.5 g/dL (ref 13.0–17.7)
Immature Grans (Abs): 0.1 10*3/uL (ref 0.0–0.1)
Immature Granulocytes: 1 %
Lymphocytes Absolute: 1.9 10*3/uL (ref 0.7–3.1)
Lymphs: 29 %
MCH: 31.8 pg (ref 26.6–33.0)
MCHC: 35.3 g/dL (ref 31.5–35.7)
MCV: 90 fL (ref 79–97)
Monocytes Absolute: 0.7 10*3/uL (ref 0.1–0.9)
Monocytes: 10 %
Neutrophils Absolute: 3.7 10*3/uL (ref 1.4–7.0)
Neutrophils: 56 %
Platelets: 188 10*3/uL (ref 150–450)
RBC: 4.56 x10E6/uL (ref 4.14–5.80)
RDW: 13.1 % (ref 11.6–15.4)
WBC: 6.6 10*3/uL (ref 3.4–10.8)

## 2020-09-17 LAB — PSA: Prostate Specific Ag, Serum: 0.6 ng/mL (ref 0.0–4.0)

## 2020-09-17 LAB — TSH: TSH: 4.82 u[IU]/mL — ABNORMAL HIGH (ref 0.450–4.500)

## 2020-09-17 LAB — VITAMIN B12: Vitamin B-12: 327 pg/mL (ref 232–1245)

## 2020-09-17 LAB — VITAMIN D 25 HYDROXY (VIT D DEFICIENCY, FRACTURES): Vit D, 25-Hydroxy: 60.3 ng/mL (ref 30.0–100.0)

## 2020-09-20 ENCOUNTER — Encounter: Payer: Self-pay | Admitting: Family Medicine

## 2020-09-22 NOTE — Progress Notes (Signed)
Pt was called to discuss his lab results. Pt stated he understood and will keep his f/u appt.

## 2020-10-12 ENCOUNTER — Ambulatory Visit (INDEPENDENT_AMBULATORY_CARE_PROVIDER_SITE_OTHER): Payer: Medicare Other | Admitting: Family Medicine

## 2020-10-12 ENCOUNTER — Encounter: Payer: Self-pay | Admitting: Family Medicine

## 2020-10-12 ENCOUNTER — Other Ambulatory Visit: Payer: Self-pay

## 2020-10-12 ENCOUNTER — Ambulatory Visit: Payer: Medicare Other | Admitting: Adult Health Nurse Practitioner

## 2020-10-12 ENCOUNTER — Ambulatory Visit (HOSPITAL_COMMUNITY)
Admission: RE | Admit: 2020-10-12 | Discharge: 2020-10-12 | Disposition: A | Discharge status: Home / Self Care | Payer: Medicare Other | Source: Ambulatory Visit | Attending: Family Medicine | Admitting: Family Medicine

## 2020-10-12 VITALS — BP 138/68 | HR 64 | Temp 97.7°F | Ht 65.0 in | Wt 218.0 lb

## 2020-10-12 DIAGNOSIS — M25511 Pain in right shoulder: Secondary | ICD-10-CM

## 2020-10-12 DIAGNOSIS — G8929 Other chronic pain: Secondary | ICD-10-CM

## 2020-10-12 DIAGNOSIS — E119 Type 2 diabetes mellitus without complications: Secondary | ICD-10-CM

## 2020-10-12 DIAGNOSIS — Z09 Encounter for follow-up examination after completed treatment for conditions other than malignant neoplasm: Secondary | ICD-10-CM

## 2020-10-12 DIAGNOSIS — E1165 Type 2 diabetes mellitus with hyperglycemia: Secondary | ICD-10-CM | POA: Diagnosis not present

## 2020-10-12 DIAGNOSIS — R7309 Other abnormal glucose: Secondary | ICD-10-CM

## 2020-10-12 DIAGNOSIS — I10 Essential (primary) hypertension: Secondary | ICD-10-CM

## 2020-10-12 DIAGNOSIS — R739 Hyperglycemia, unspecified: Secondary | ICD-10-CM

## 2020-10-12 IMAGING — DX DG SHOULDER 2+V*R*
3 series · 3 of 3 positions shown · non-contrast
Comparison: None.

CLINICAL DATA: 74-year-old male with chronic right shoulder pain.

EXAM:
RIGHT SHOULDER - 2+ VIEW

[shoulder grashey]
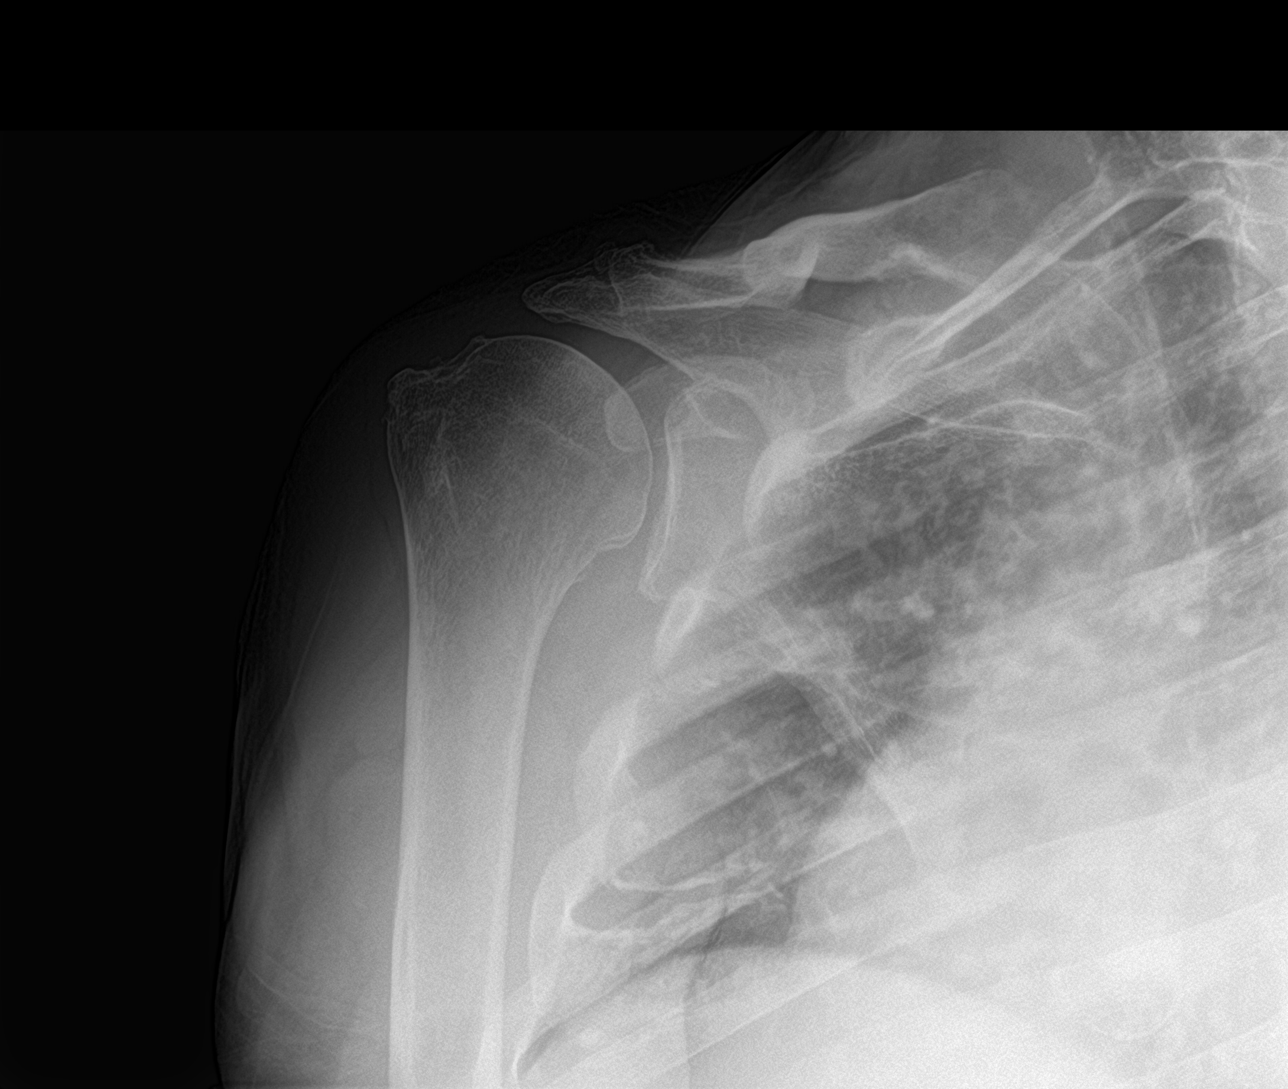

[shoulder y view]
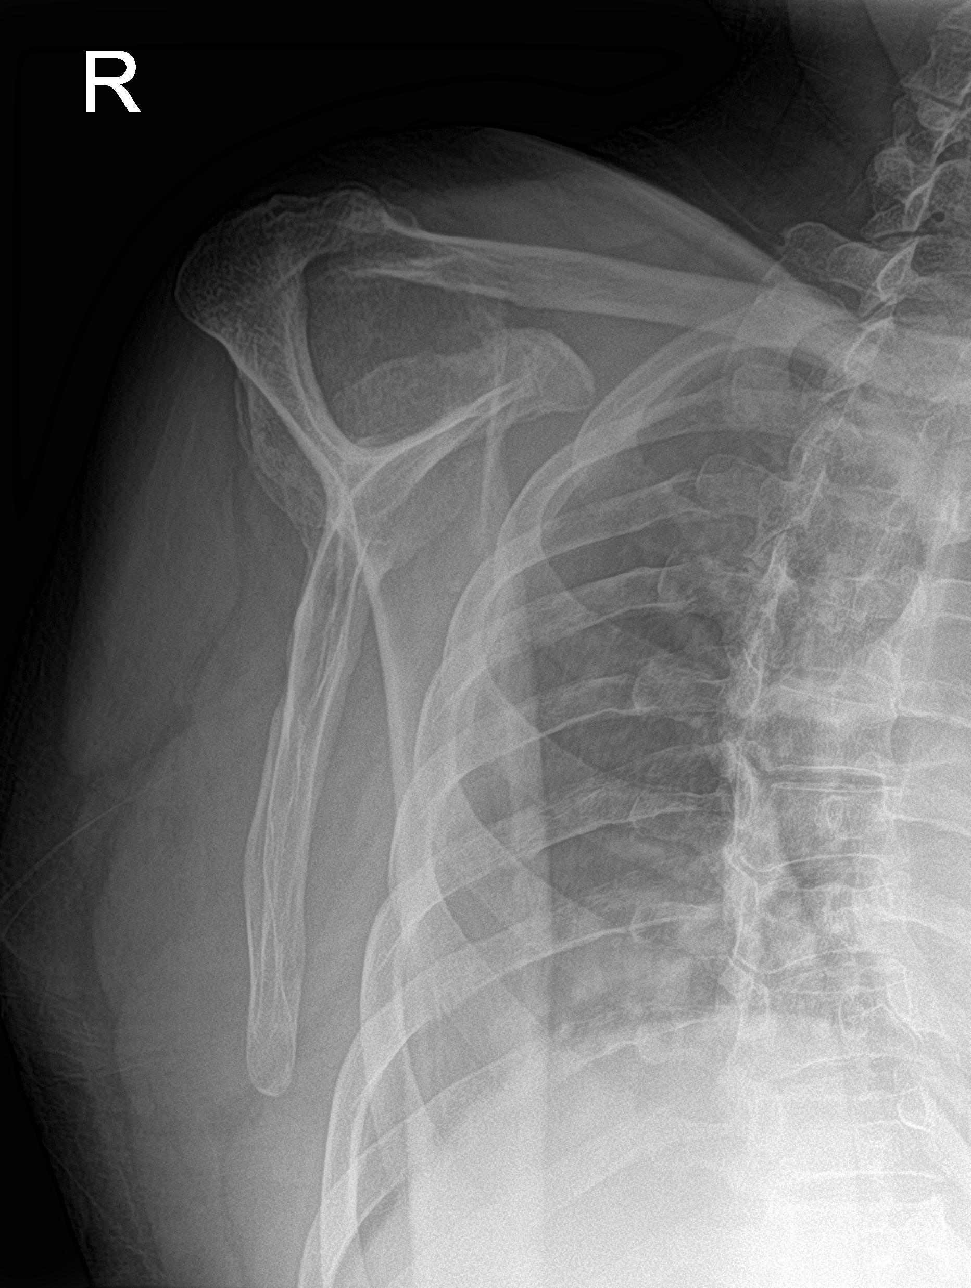

[shoulder axillary]
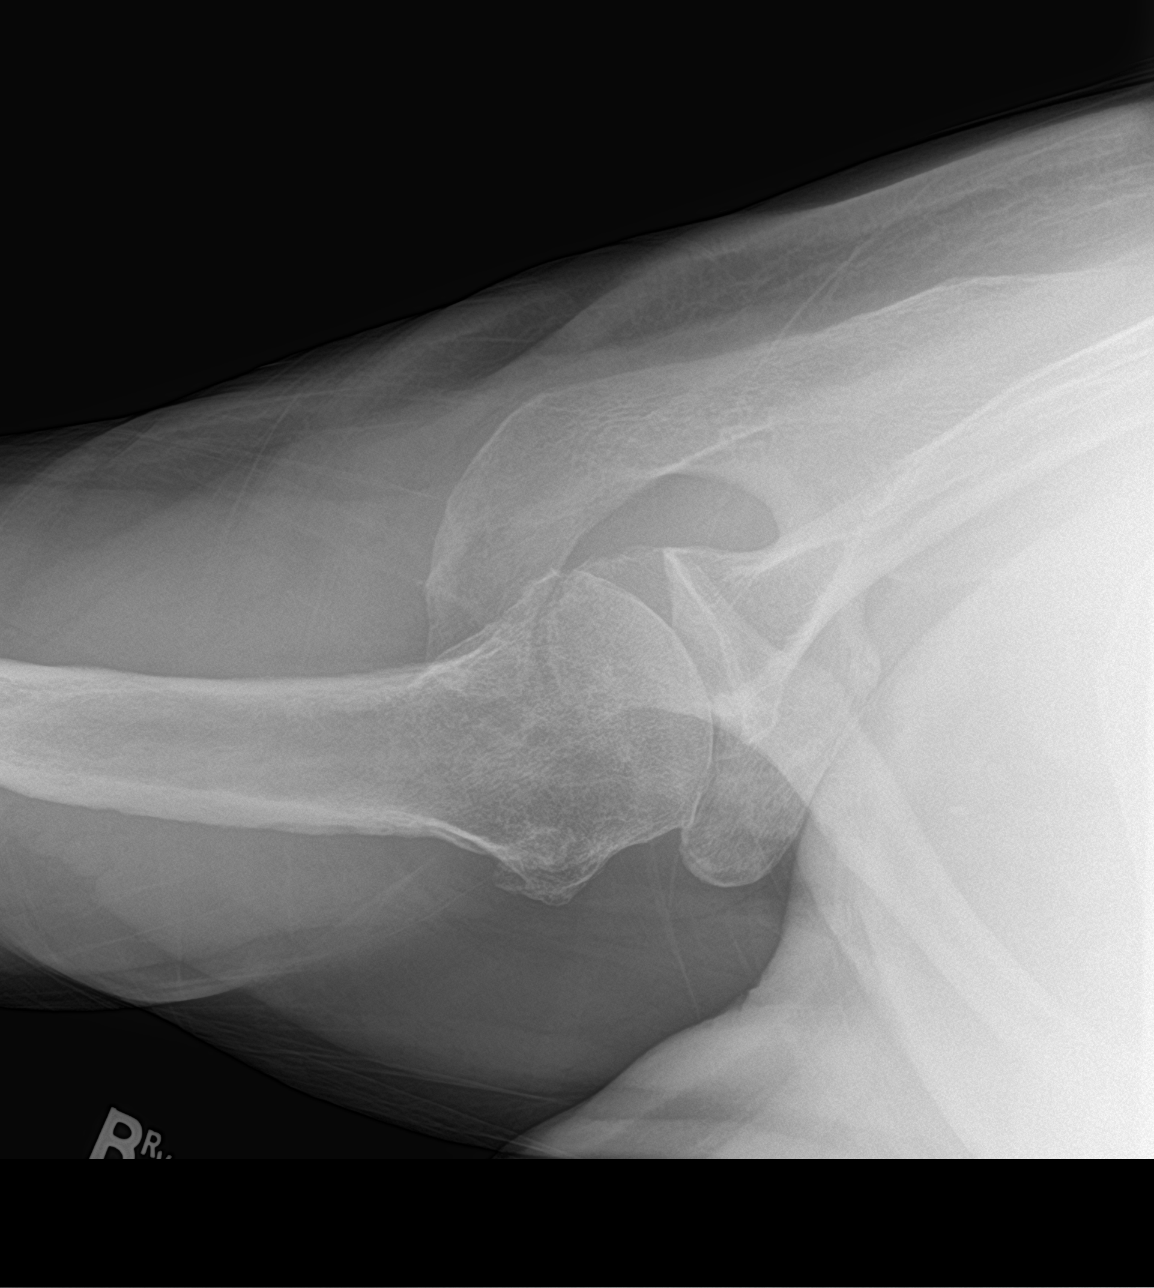

[3 of 3 positions shown; findings below may reference images not displayed]

FINDINGS: There is no acute fracture or dislocation. Mild degenerative changes
of the right shoulder. There is slight limitation of the right
humeral head likely related to chronic rotator cuff injury. The soft
tissues are unremarkable.
IMPRESSION: No acute fracture or dislocation.

## 2020-10-12 NOTE — Patient Instructions (Signed)

## 2020-10-12 NOTE — Progress Notes (Signed)
[facility]   Established Patient Office Visit  Subjective:  Patient ID: Brett [name], male    DOB: 01/03/1946  Age: 74 y.o. MRN: [id number]  CC:  Chief Complaint  Patient presents with  . Follow-up    Having upcomming neurology appointment    HPI Lister J Weinrich is a 74 year old male who presents for Follow Up today.   Patient Active Problem List   Diagnosis Date Noted  . Diabetes mellitus due to underlying condition with stage 2 chronic kidney disease, with long-term current use of insulin ([place]) 07/05/2020  . Gastroesophageal reflux disease 11/23/2018  . Former smoker 08/15/2018  . FHx: heart disease 08/15/2018  . Diabetic retinopathy ([place]) 08/16/2017  . Type 2 diabetes mellitus with stage 2 chronic kidney disease, with long-term current use of insulin ([place]) 08/08/2017  . Gluttony 05/06/2017  . Obesity (BMI 30.0-34.9) 09/17/2015  . Hyperlipidemia associated with type 2 diabetes mellitus ([place]) 01/21/2014  . Vitamin D deficiency 01/21/2014  . Medication management 01/21/2014  . CKD stage 2 due to type 2 diabetes mellitus ([place])   . Hypertension   . OSA (obstructive sleep apnea)   . Gout   . Dilated cardiomyopathy ([place])   . Hemochromatosis 09/27/2011   Current Status: Since his last office visit, he is doing well with no complaints. He most recent normal range of preprandial blood glucose levels have been between 300-350. He has seen low range of 89 and high of 350 since his last office visit. He denies fatigue, frequent urination, blurred vision, excessive hunger, excessive thirst, weight gain, weight loss, and poor wound healing. He continues to check his feet regularly. He denies fevers, chills, fatigue, recent infections, weight loss, and night sweats. He has not had any headaches, dizziness, and falls. No chest pain, heart palpitations, cough and shortness of breath reported. Denies GI problems such as nausea, vomiting,  diarrhea, and constipation. He has no reports of blood in stools, dysuria and hematuria. No depression or anxiety reported today. He is taking all medications as prescribed. He denies pain today.   Past Medical History:  Diagnosis Date  . Acid reflux   . Allergy   . Colon polyp   . Diabetes mellitus without complication ([place])   . Dilated cardiomyopathy ([place])    non isch  . Gout   . Gout   . Hemochromatosis   . Hypertension   . OSA (obstructive sleep apnea)   . Urine frequency     Past Surgical History:  Procedure Laterality Date  . EYE SURGERY Bilateral    Cataract    Family History  Problem Relation Age of Onset  . Diabetes Father   . Diabetes Other   . Hypertension Other   . Hyperlipidemia Other   . Heart disease Other   . Cancer Maternal Uncle        brain  . Heart attack Brother        42  . Diabetes Brother     Social History   Socioeconomic History  . Marital status: Divorced    Spouse name: Not on file  . Number of children: Not on file  . Years of education: Not on file  . Highest education level: Not on file  Occupational History  . Not on file  Tobacco Use  . Smoking status: Former Smoker    Packs/day: 3.00    Years: 19.00    Pack years: 57.00  Types: Cigarettes    Start date: 10/24/1957    Quit date: 11/24/1976    Years since quitting: 43.9  . Smokeless tobacco: Never Used  Substance and Sexual Activity  . Alcohol use: Yes    Comment: Occasionally  . Drug use: No  . Sexual activity: Not on file  Other Topics Concern  . Not on file  Social History Narrative  . Not on file   Social Determinants of Health   Financial Resource Strain: Not on file  Food Insecurity: Not on file  Transportation Needs: Not on file  Physical Activity: Not on file  Stress: Not on file  Social Connections: Not on file  Intimate Partner Violence: Not on file    Outpatient Medications Prior to Visit  Medication Sig Dispense Refill  . allopurinol (ZYLOPRIM)  300 MG tablet TAKE 1 TABLET BY MOUTH  DAILY TO PREVENT GOUT 90 tablet 3  . amLODipine (NORVASC) 10 MG tablet Take      1 tablet      at Night        for BP 90 tablet 0  . aspirin 81 MG tablet Take 81 mg by mouth daily.    Marland Kitchen atorvastatin (LIPITOR) 20 MG tablet TAKE 1 TABLET BY MOUTH  DAILY FOR CHOLESTEROL 90 tablet 3  . bisoprolol-hydrochlorothiazide (ZIAC) 10-6.25 MG tablet TAKE 1 TABLET BY MOUTH  DAILY FOR BLOOD PRESSURE 90 tablet 3  . Blood Glucose Monitoring Suppl DEVI Check blood sugar 3 times a day-DX-E11.22 1 each 0  . Cholecalciferol (VITAMIN D PO) Take 5,000 Units by mouth daily.    . Continuous Blood Gluc Sensor (FREESTYLE LIBRE 14 DAY SENSOR) MISC Apply new sensor every 14 days. 1 each 11  . furosemide (LASIX) 40 MG tablet TAKE 1 TABLET BY MOUTH  DAILY FOR BP, / FLUID  RETENTION / ANKLE SWELLING 90 tablet 3  . glipiZIDE (GLUCOTROL) 10 MG tablet TAKE 1 TABLET BY MOUTH 3  TIMES DAILY WITH MEALS FOR  DIABETES 270 tablet 3  . insulin isophane & regular human (HUMULIN 70/30 MIX) (70-30) 100 UNIT/ML KwikPen Inject into subcutaneous skin 52min before meals, 45units breakfast, 40units lunch and 22units dinner. (Patient taking differently: Inject into subcutaneous skin 30min before meals, 45units breakfast, 25 units lunch and 20units dinner.) 135 mL 4  . Insulin Pen Needle 31G X 8 MM MISC Use one three times a day and as needed Mettler 1000 each 3  . Lancets Ultra Fine MISC Check blood sugar 3 times a day-E11.22 300 each 3  . Loratadine (CLARITIN PO) Take by mouth daily.    . magnesium oxide (MAG-OX) 400 MG tablet Take 400 mg by mouth 2 (two) times daily.     . metFORMIN (GLUCOPHAGE-XR) 500 MG 24 hr tablet Take    2 tablets    2 x /day    With Meals for Diabetes 360 tablet 3  . minoxidil (LONITEN) 10 MG tablet Take 1 tablet Daily for BP 90 tablet 0  . montelukast (SINGULAIR) 10 MG tablet TAKE 1 TABLET BY MOUTH  DAILY FOR ALLERGIES (Patient taking differently: TAKE 1 TABLET BY MOUTH  DAILY FOR ALLERGIES)  90 tablet 3  . olmesartan (BENICAR) 40 MG tablet TAKE 1 TABLET BY MOUTH  DAILY FOR BLOOD PRESSURE 90 tablet 3  . tadalafil (CIALIS) 20 MG tablet Take 1/2 to 1  tablet every 2 to 3 days if needed for XXXX 30 tablet 3  . tamsulosin (FLOMAX) 0.4 MG CAPS capsule Take 1 tablet at  Bedtime for Prostate 90 capsule 3  . glucose blood (IGLUCOSE TEST STRIPS) test strip Check blood sugar 3 times daily-DX-E11.22 (Patient not taking: Reported on 10/12/2020) 300 each 12   No facility-administered medications prior to visit.    No Known Allergies  ROS Review of Systems  Constitutional: Negative.   HENT: Negative.   Eyes: Negative.   Respiratory: Negative.   Cardiovascular: Negative.   Gastrointestinal: Positive for abdominal distention (obese).  Endocrine: Negative.   Genitourinary: Negative.   Musculoskeletal: Positive for arthralgias (generalized joint pain).       Right shoulder pain  Skin: Negative.   Allergic/Immunologic: Negative.   Neurological: Positive for dizziness (occasional ) and headaches (occasional ).  Hematological: Negative.   Psychiatric/Behavioral: Negative.       Objective:    Physical Exam Vitals and nursing note reviewed.  Constitutional:      Appearance: Normal appearance.  HENT:     Head: Normocephalic and atraumatic.     Nose: Nose normal.     Mouth/Throat:     Pharynx: Oropharynx is clear.  Cardiovascular:     Rate and Rhythm: Normal rate and regular rhythm.     Pulses: Normal pulses.     Heart sounds: Normal heart sounds.  Pulmonary:     Effort: Pulmonary effort is normal.     Breath sounds: Normal breath sounds.  Abdominal:     General: Bowel sounds are normal. There is distension (obese).     Palpations: Abdomen is soft.  Musculoskeletal:        General: Normal range of motion.     Cervical back: Normal range of motion and neck supple.  Skin:    General: Skin is warm and dry.  Neurological:     General: No focal deficit present.     Mental  Status: He is alert and oriented to person, place, and time.  Psychiatric:        Mood and Affect: Mood normal.        Behavior: Behavior normal.        Thought Content: Thought content normal.        Judgment: Judgment normal.    BP 138/68   Pulse 64   Temp 97.7 F (36.5 C) (Temporal)   Ht 5\' 5"  (1.651 m)   Wt 218 lb (98.9 kg)   SpO2 96%   BMI 36.28 kg/m  Wt Readings from Last 3 Encounters:  10/12/20 218 lb (98.9 kg)  09/16/20 226 lb (102.5 kg)  09/03/20 224 lb 11.2 oz (101.9 kg)     Health Maintenance Due  Topic Date Due  . COLONOSCOPY  08/24/2020    There are no preventive care reminders to display for this patient.  Lab Results  Component Value Date   TSH 4.820 (H) 09/16/2020   Lab Results  Component Value Date   WBC 6.6 09/16/2020   HGB 14.5 09/16/2020   HCT 41.1 09/16/2020   MCV 90 09/16/2020   PLT 188 09/16/2020   Lab Results  Component Value Date   NA 138 09/16/2020   K 4.0 09/16/2020   CO2 23 09/16/2020   GLUCOSE 285 (H) 09/16/2020   BUN 20 09/16/2020   CREATININE 1.06 09/16/2020   BILITOT 0.7 09/16/2020   ALKPHOS 54 09/16/2020   AST 17 09/16/2020   ALT 27 09/16/2020   PROT 6.8 09/16/2020   ALBUMIN 4.5 09/16/2020   CALCIUM 9.7 09/16/2020   Lab Results  Component Value Date   CHOL 115 07/06/2020  Lab Results  Component Value Date   HDL 35 (L) 07/06/2020   Lab Results  Component Value Date   LDLCALC 56 07/06/2020   Lab Results  Component Value Date   TRIG 162 (H) 07/06/2020   Lab Results  Component Value Date   CHOLHDL 3.3 07/06/2020   Lab Results  Component Value Date   HGBA1C 10.0 (A) 09/16/2020   HGBA1C 10.0 09/16/2020   HGBA1C 10.0 (A) 09/16/2020   HGBA1C 10.0 (A) 09/16/2020   Assessment & Plan:   1. Chronic right shoulder pain - DG Shoulder Right; Future  2. Uncontrolled type 2 diabetes mellitus with hyperglycemia ([place]) He will continue medication as prescribed, to decrease foods/beverages high in sugars and  carbs and follow Heart Healthy or DASH diet. Increase physical activity to at least 30 minutes cardio exercise daily.  - Ambulatory referral to Nutrition and Diabetic Education  3. Hemoglobin A1C greater than 9%, indicating poor diabetic control Hgb A1c increased at 10.0. Monitor. - Ambulatory referral to Nutrition and Diabetic Education  4. Hyperglycemia - Ambulatory referral to Nutrition and Diabetic Education  5. Encounter for diabetic foot exam ([place]) Negative. Foot exam tolerated well. No areas of decreased sensitivity noted upon foot exam. Patient counseled on proper foot hygiene. He is encouraged to exam feet often (daily), using mirror if necessary; keep feet clean and dry (especially between toes), keep feet moistened, wear cotton socks, and avoid wearing open-toed shoes, high-heel shoes, and sandals. Patient verbalized understanding.   6. Essential hypertension The current medical regimen is effective; blood pressure is stable at 138/68 today; continue present plan and medications as prescribed. He will continue to take medications as prescribed, to decrease high sodium intake, excessive alcohol intake, increase potassium intake, smoking cessation, and increase physical activity of at least 30 minutes of cardio activity daily. He will continue to follow Heart Healthy or DASH diet.  7. Follow up He will follow up in 2 months.    No orders of the defined types were placed in this encounter.   Orders Placed This Encounter  Procedures  . DG Shoulder Right  . Ambulatory referral to Nutrition and Diabetic Education     Referral Orders     Ambulatory referral to Nutrition and Diabetic Education   Kathe Becton, MSN, ANE, FNP-BC [facility] [address], [place] [address] [phone]- fax  Problem List Items Addressed This Visit   None   Visit Diagnoses     Chronic right shoulder pain    -  Primary   Relevant Orders   DG Shoulder Right (Completed)   Uncontrolled type 2 diabetes mellitus with hyperglycemia ([place])       Relevant Orders   Ambulatory referral to Nutrition and Diabetic Education   Hemoglobin A1C greater than 9%, indicating poor diabetic control       Relevant Orders   Ambulatory referral to Nutrition and Diabetic Education   Hyperglycemia       Relevant Orders   Ambulatory referral to Nutrition and Diabetic Education   Encounter for diabetic foot exam ([place])       Essential hypertension       Follow up          No orders of the defined types were placed in this encounter.   Follow-up: Return in about 2 months (around 12/13/2020).    Azzie Glatter, FNP

## 2020-10-13 ENCOUNTER — Other Ambulatory Visit: Payer: Self-pay | Admitting: Adult Health Nurse Practitioner

## 2020-10-13 ENCOUNTER — Encounter (INDEPENDENT_AMBULATORY_CARE_PROVIDER_SITE_OTHER): Payer: Medicare Other | Admitting: Ophthalmology

## 2020-10-13 DIAGNOSIS — H35033 Hypertensive retinopathy, bilateral: Secondary | ICD-10-CM

## 2020-10-13 DIAGNOSIS — H35373 Puckering of macula, bilateral: Secondary | ICD-10-CM

## 2020-10-13 DIAGNOSIS — N138 Other obstructive and reflux uropathy: Secondary | ICD-10-CM

## 2020-10-13 DIAGNOSIS — E113391 Type 2 diabetes mellitus with moderate nonproliferative diabetic retinopathy without macular edema, right eye: Secondary | ICD-10-CM | POA: Diagnosis not present

## 2020-10-13 DIAGNOSIS — H43813 Vitreous degeneration, bilateral: Secondary | ICD-10-CM | POA: Diagnosis not present

## 2020-10-13 DIAGNOSIS — H43823 Vitreomacular adhesion, bilateral: Secondary | ICD-10-CM | POA: Diagnosis not present

## 2020-10-13 DIAGNOSIS — I10 Essential (primary) hypertension: Secondary | ICD-10-CM | POA: Diagnosis not present

## 2020-10-13 DIAGNOSIS — E113292 Type 2 diabetes mellitus with mild nonproliferative diabetic retinopathy without macular edema, left eye: Secondary | ICD-10-CM | POA: Diagnosis not present

## 2020-10-17 ENCOUNTER — Encounter: Payer: Self-pay | Admitting: Family Medicine

## 2020-10-19 ENCOUNTER — Encounter: Payer: Self-pay | Admitting: Endocrinology

## 2020-11-08 ENCOUNTER — Other Ambulatory Visit: Payer: Self-pay | Admitting: Adult Health

## 2020-11-08 ENCOUNTER — Other Ambulatory Visit: Payer: Self-pay | Admitting: Internal Medicine

## 2020-11-23 ENCOUNTER — Encounter: Payer: Medicare Other | Attending: Family Medicine | Admitting: Dietician

## 2020-11-23 ENCOUNTER — Encounter: Payer: Self-pay | Admitting: Dietician

## 2020-11-23 ENCOUNTER — Other Ambulatory Visit: Payer: Self-pay

## 2020-11-23 DIAGNOSIS — N182 Chronic kidney disease, stage 2 (mild): Secondary | ICD-10-CM

## 2020-11-23 DIAGNOSIS — E1165 Type 2 diabetes mellitus with hyperglycemia: Secondary | ICD-10-CM | POA: Insufficient documentation

## 2020-11-23 DIAGNOSIS — E0822 Diabetes mellitus due to underlying condition with diabetic chronic kidney disease: Secondary | ICD-10-CM

## 2020-11-23 NOTE — Progress Notes (Signed)
Diabetes Self-Management Education  Visit Type: First/Initial  Appt. Start Time: 110 Appt. End Time: 1215  11/23/2020  [name], identified by name and date of birth, is a 75 y.o. male with a diagnosis of Diabetes: Type 2.   ASSESSMENT Patient is here today with his granddaughter. He would like to learn how to take better care of himself. (nutrition plan, lose weight, be more active) Granddaughter states that he eats big meals 1-2 times per day.  He spends a lot of time on the computer. Today, he complains that his feet are swollen.  History includes Type 2 Diabetes (1989), GERD, diabetic retinopathy, HDL, HTN, vitamin D deficiency, HTN, OSA (no c-pap), bilayeral cardiomyopathy, hemochromatosis. Labs noted to include:  eGFR 69, vitamin B-12 327, vitamin D, A1C 10% 09/16/2020 increased from 8.7% 07/06/2020. Medication includes:  Lasix, vitamin D, glipizide, Metformin, 70/30 insulin (45 units 5-6 am, 35 units about noon, 25 units before bed. Sleep:  Poor, difficulty going to sleep and up multiple times per night (increased urination is one cause).  Weight:  222 lbs.  He complains that he started to gain more weight when he started the insulin.  He checks his blood glucose once daily. He cannot remember the meter that he uses.  He no longer uses the [employer] as this was too expensive.  He has no strips and did not know how to obtain strips so is getting some from a friend in [place]. Meals:  Very light breakfast, light or no dinner, occasional snacks at night, very large late lunch  Provided patient with a Contour Next One Blood Glucose Meter.  He is to call his MD for strips and lancets for this meter. Lot #:  [id number], Expiration 03/23/2021 He was instructed on its use, could demonstrate and blood glucose was 109  Patient lives alone.  He does his own shopping. He does not cook and eats out frequently.  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   He moved here about 14 years ago to be closer to his family.  He is originally from [place].  He states that he reads English a little. Height 5' 5"  (1.651 m), weight 222 lb (100.7 kg). Body mass index is 36.94 kg/m.   Diabetes Self-Management Education - 11/23/20 1135      Visit Information   Visit Type First/Initial      Initial Visit   Diabetes Type Type 2    Are you currently following a meal plan? No    Are you taking your medications as prescribed? Yes    Date Diagnosed 1989      Health Coping   How would you rate your overall health? Good      Psychosocial Assessment   Patient Belief/Attitude about Diabetes Other (comment)   willing to listen and learn but harder to make changes   Self-care barriers English as a second language    Self-management support Doctor's office;Family    Other persons present Patient;Family Member    Patient Concerns Nutrition/Meal planning;Glycemic Control;Weight Control;Healthy Lifestyle    Special Needs None    Preferred Learning Style No preference indicated    Learning Readiness Ready    How often do you need to have someone help you when you read instructions, pamphlets, or other written materials from your doctor or pharmacy? 1 - Never    What is the last grade level you completed in school? 10      Pre-Education Assessment  Patient understands the diabetes disease and treatment process. Needs Instruction    Patient understands incorporating nutritional management into lifestyle. Needs Instruction    Patient undertands incorporating physical activity into lifestyle. Needs Instruction    Patient understands using medications safely. Needs Instruction    Patient understands monitoring blood glucose, interpreting and using results Needs Instruction    Patient understands prevention, detection, and treatment of acute complications. Needs Instruction    Patient understands prevention, detection, and treatment of chronic complications. Needs  Instruction    Patient understands how to develop strategies to address psychosocial issues. Needs Instruction    Patient understands how to develop strategies to promote health/change behavior. Needs Instruction      Complications   Last HgB A1C per patient/outside source 10 %   09/16/2020   How often do you check your blood sugar? 1-2 times/day    Fasting Blood glucose range (mg/dL) 70-129    Number of hypoglycemic episodes per month 0    Have you had a dilated eye exam in the past 12 months? Yes    Have you had a dental exam in the past 12 months? Yes    Are you checking your feet? No      Dietary Intake   Breakfast bread, butter, coffee with half and half    Lunch sandwich OR steak, potato, corn OR fried foods   2:00-3:00   Dinner none or canned soup    Snack (evening) used to eat a lot of fruit    Beverage(s) water, coffee with half and half, occasional diet soda      Exercise   Exercise Type ADL's      Patient Education   Previous Diabetes Education Yes (please comment)   doctor's appointments   Disease state  Definition of diabetes, type 1 and 2, and the diagnosis of diabetes    Nutrition management  Information on hints to eating out and maintain blood glucose control.;Meal options for control of blood glucose level and chronic complications.;Role of diet in the treatment of diabetes and the relationship between the three main macronutrients and blood glucose level    Physical activity and exercise  Role of exercise on diabetes management, blood pressure control and cardiac health.;Helped patient identify appropriate exercises in relation to his/her diabetes, diabetes complications and other health issue.    Medications Reviewed patients medication for diabetes, action, purpose, timing of dose and side effects.;Taught/reviewed insulin injection, site rotation, insulin storage and needle disposal.    Monitoring Taught/discussed recording of test results and interpretation of  SMBG.;Identified appropriate SMBG and/or A1C goals.;Daily foot exams    Acute complications Taught treatment of hypoglycemia - the 15 rule.;Discussed and identified patients' treatment of hyperglycemia.    Chronic complications Assessed and discussed foot care and prevention of foot problems;Relationship between chronic complications and blood glucose control    Psychosocial adjustment Worked with patient to identify barriers to care and solutions;Identified and addressed patients feelings and concerns about diabetes      Individualized Goals (developed by patient)   Nutrition General guidelines for healthy choices and portions discussed    Physical Activity Exercise 5-7 days per week;30 minutes per day    Medications take my medication as prescribed    Monitoring  test my blood glucose as discussed    Reducing Risk examine blood glucose patterns;do foot checks daily;increase portions of healthy fats    Health Coping discuss diabetes with (comment)   MD, RD, CDCES     Post-Education Assessment  Patient understands the diabetes disease and treatment process. Demonstrates understanding / competency    Patient understands incorporating nutritional management into lifestyle. Needs Review    Patient undertands incorporating physical activity into lifestyle. Needs Review    Patient understands using medications safely. Needs Review    Patient understands monitoring blood glucose, interpreting and using results Needs Review    Patient understands prevention, detection, and treatment of acute complications. Needs Review    Patient understands prevention, detection, and treatment of chronic complications. Demonstrates understanding / competency    Patient understands how to develop strategies to address psychosocial issues. Needs Review    Patient understands how to develop strategies to promote health/change behavior. Needs Review      Outcomes   Expected Outcomes Demonstrated interest in learning.  Expect positive outcomes    Future DMSE 4-6 wks    Program Status Not Completed           Individualized Plan for Diabetes Self-Management Training:   Learning Objective:  Patient will have a greater understanding of diabetes self-management. Patient education plan is to attend individual and/or group sessions per assessed needs and concerns.   Plan:   Patient Instructions  Call your doctor to get a prescription for your blood glucose monitoring strips and lancets for the Contour Next One blood glucose meter. Ask your doctor about your swollen feet.  Check your feet every day.  Continue to take your medication as prescribed. Recommend using a pill box. Check your blood sugar daily  Avoid all added salt. When you eat out, ask them to not add salt to your foods. Eat more vegetables and salad. Choose baked rather than fried food.  Remove the skin from the chicken.   Aim to be active most every day.  Aim for 30 minutes daily.  Walk  [employer] Senior Exercise videos   [employer] Sit and Be fit   Expected Outcomes:  Demonstrated interest in learning. Expect positive outcomes  Education material provided: ADA - How to Thrive: A Guide for Your Journey with Diabetes and My Plate  If problems or questions, patient to contact team via:  Phone  Future DSME appointment: 4-6 wks

## 2020-11-23 NOTE — Patient Instructions (Addendum)
Call your doctor to get a prescription for your blood glucose monitoring strips and lancets for the Contour Next One blood glucose meter. Ask your doctor about your swollen feet.  Check your feet every day.  Continue to take your medication as prescribed. Recommend using a pill box. Check your blood sugar daily  Avoid all added salt. When you eat out, ask them to not add salt to your foods. Eat more vegetables and salad. Choose baked rather than fried food.  Remove the skin from the chicken.   Aim to be active most every day.  Aim for 30 minutes daily.  Walk  Mining engineer Sit and Be fit

## 2020-11-24 ENCOUNTER — Other Ambulatory Visit: Payer: Self-pay | Admitting: Adult Health

## 2020-11-25 ENCOUNTER — Other Ambulatory Visit: Payer: Self-pay | Admitting: Internal Medicine

## 2020-11-25 DIAGNOSIS — I10 Essential (primary) hypertension: Secondary | ICD-10-CM

## 2020-12-17 ENCOUNTER — Ambulatory Visit: Payer: Medicare Other | Admitting: Adult Health Nurse Practitioner

## 2020-12-31 ENCOUNTER — Other Ambulatory Visit: Payer: Self-pay | Admitting: Internal Medicine

## 2020-12-31 DIAGNOSIS — N138 Other obstructive and reflux uropathy: Secondary | ICD-10-CM

## 2021-01-01 ENCOUNTER — Inpatient Hospital Stay: Payer: Medicare Other | Attending: Oncology

## 2021-01-01 ENCOUNTER — Telehealth: Payer: Self-pay | Admitting: *Deleted

## 2021-01-01 ENCOUNTER — Other Ambulatory Visit: Payer: Self-pay

## 2021-01-01 LAB — CBC WITH DIFFERENTIAL (CANCER CENTER ONLY)
Abs Immature Granulocytes: 0.03 10*3/uL (ref 0.00–0.07)
Basophils Absolute: 0 10*3/uL (ref 0.0–0.1)
Basophils Relative: 0 %
Eosinophils Absolute: 0.2 10*3/uL (ref 0.0–0.5)
Eosinophils Relative: 3 %
HCT: 38.3 % — ABNORMAL LOW (ref 39.0–52.0)
Hemoglobin: 13.7 g/dL (ref 13.0–17.0)
Immature Granulocytes: 0 %
Lymphocytes Relative: 20 %
Lymphs Abs: 1.4 10*3/uL (ref 0.7–4.0)
MCH: 31.2 pg (ref 26.0–34.0)
MCHC: 35.8 g/dL (ref 30.0–36.0)
MCV: 87.2 fL (ref 80.0–100.0)
Monocytes Absolute: 0.7 10*3/uL (ref 0.1–1.0)
Monocytes Relative: 10 %
Neutro Abs: 4.6 10*3/uL (ref 1.7–7.7)
Neutrophils Relative %: 67 %
Platelet Count: 213 10*3/uL (ref 150–400)
RBC: 4.39 MIL/uL (ref 4.22–5.81)
RDW: 13.4 % (ref 11.5–15.5)
WBC Count: 7 10*3/uL (ref 4.0–10.5)
nRBC: 0 % (ref 0.0–0.2)

## 2021-01-01 LAB — FERRITIN: Ferritin: 44 ng/mL (ref 24–336)

## 2021-01-01 NOTE — Telephone Encounter (Signed)
-----   Message from [name], MD sent at 01/01/2021  1:42 PM EST ----- Please call patient, ferritin is in goal range, follow-up as scheduled

## 2021-01-01 NOTE — Telephone Encounter (Signed)
Notified that ferritin is in goal range at 44. F/U as scheduled.

## 2021-01-04 ENCOUNTER — Ambulatory Visit: Payer: Medicare Other | Admitting: Dietician

## 2021-01-04 ENCOUNTER — Encounter: Payer: Self-pay | Admitting: Dietician

## 2021-01-04 DIAGNOSIS — N182 Chronic kidney disease, stage 2 (mild): Secondary | ICD-10-CM

## 2021-01-04 DIAGNOSIS — Z794 Long term (current) use of insulin: Secondary | ICD-10-CM

## 2021-01-04 NOTE — Patient Instructions (Signed)
Continue to choose lean meats (chicken and fish). Bake rather than fried most often. Continue to choose increased vegetables. 1 serving of fresh fruit twice a day with a meal would be fine. Continue to avoid added salt and choose foods that are not packaged with salt.

## 2021-01-04 NOTE — Progress Notes (Signed)
Diabetes Self-Management Education  Visit Type: Follow-up  Appt. Start Time: 1110 Appt. End Time: 1140 This visit was completed via telephone due to the COVID-19 pandemic.   I spoke with Brett [name] and verified that I was speaking with the correct person with two patient identifiers (full name and date of birth).   I discussed the limitations related to this kind of visit and the patient is willing to proceed. His ex wife is also present and has been helping him.  His granddaughter has not been feeling well and could not bring him to this appointment today. 01/04/2021  Brett [name], identified by name and date of birth, is a 75 y.o. male with a diagnosis of Diabetes:  .   ASSESSMENT   His last visit with this RD was 11/23/2020. He continues the Lasix.  States that his feet are still swelling which worsens as the day progresses.  States that he avoids salt.   He is using fewer fried foods and reports intake of increased vegetables, and stopping eating when satisfied. He is concerned that he continues to gain weight and states he has gained since starting on insulin. Weight 225 lbs per patient and increased from 222 lbs 11/23/2020.   Medications reviewed as below.  He reports decreasing his insulin from 45 units to 40 units each am and continues other doses. Fasting blood glucose 84 this and and 100-115 usually. He is not getting exercise but does do yard work at times and enjoys fishing.  Encouraged walking or some form of activity daily as tolerated if allowed by his MD. He has an appointment with his MD next week.  History includes Type 2 Diabetes (1989), GERD, diabetic retinopathy, HDL, HTN, vitamin D deficiency, HTN, OSA (no c-pap), bilayeral cardiomyopathy, hemochromatosis. Labs noted to include:  eGFR 69, vitamin B-12 327, vitamin D, A1C 10% 09/16/2020 increased from 8.7% 07/06/2020. Medication includes:  Lasix, vitamin D, glipizide, Metformin, 70/30 insulin (40 units 5-6 am, 35  units about noon, 25 units before bed. Sleep:  Poor, difficulty going to sleep and up multiple times per night (increased urination is one cause).  Patient lives alone.  Ex wife currently is there and has been helping patient.  He does his own shopping. He does not cook and eats out frequently.  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  He moved here about 14 years ago to be closer to his family.  He is originally from [place].  He states that he reads English a little.    Diabetes Self-Management Education - 01/04/21 1144      Visit Information   Visit Type Follow-up      Initial Visit   Are you taking your medications as prescribed? Yes      Pre-Education Assessment   Patient understands the diabetes disease and treatment process. Demonstrates understanding / competency    Patient understands incorporating nutritional management into lifestyle. Needs Review    Patient undertands incorporating physical activity into lifestyle. Needs Review    Patient understands using medications safely. Demonstrates understanding / competency    Patient understands monitoring blood glucose, interpreting and using results Demonstrates understanding / competency    Patient understands prevention, detection, and treatment of acute complications. Demonstrates understanding / competency    Patient understands prevention, detection, and treatment of chronic complications. Demonstrates understanding / competency    Patient understands how to develop strategies to address psychosocial issues. Demonstrates understanding / competency    Patient understands how  to develop strategies to promote health/change behavior. Needs Review      Complications   How often do you check your blood sugar? 1-2 times/day    Fasting Blood glucose range (mg/dL) 70-129      Dietary Intake   Breakfast quick oatmeal, 2% milk, coffee with fat free creamer    Lunch chicken, vegetables    Snack (afternoon) baked  goods (homemade bread etc)    Dinner rice, fish, vegetables    Beverage(s) water, coffee with fat free creamer, occasional diet soda      Exercise   Exercise Type ADL's      Patient Education   Previous Diabetes Education Yes (please comment)   1 month ago   Nutrition management  Meal options for control of blood glucose level and chronic complications.    Medications Reviewed patients medication for diabetes, action, purpose, timing of dose and side effects.      Individualized Goals (developed by patient)   Nutrition General guidelines for healthy choices and portions discussed    Physical Activity 15 minutes per day;Exercise 3-5 times per week    Medications take my medication as prescribed    Monitoring  test my blood glucose as discussed    Reducing Risk increase portions of healthy fats      Patient Self-Evaluation of Goals - Patient rates self as meeting previously set goals (% of time)   Nutrition >75%    Physical Activity 25 - 50%    Medications >75%    Monitoring >75%    Problem Solving >75%    Reducing Risk >75%    Health Coping >75%      Post-Education Assessment   Patient understands the diabetes disease and treatment process. Demonstrates understanding / competency    Patient understands incorporating nutritional management into lifestyle. Needs Review    Patient undertands incorporating physical activity into lifestyle. Demonstrates understanding / competency    Patient understands using medications safely. Demonstrates understanding / competency    Patient understands monitoring blood glucose, interpreting and using results Demonstrates understanding / competency    Patient understands prevention, detection, and treatment of acute complications. Demonstrates understanding / competency    Patient understands prevention, detection, and treatment of chronic complications. Demonstrates understanding / competency    Patient understands how to develop strategies to  address psychosocial issues. Demonstrates understanding / competency    Patient understands how to develop strategies to promote health/change behavior. Needs Review      Outcomes   Expected Outcomes Demonstrated interest in learning. Expect positive outcomes    Future DMSE 3-4 months    Program Status Not Completed      Subsequent Visit   Since your last visit have you continued or begun to take your medications as prescribed? Yes    Since your last visit have you experienced any weight changes? Gain    Weight Gain (lbs) 3    Since your last visit, are you checking your blood glucose at least once a day? Yes           Individualized Plan for Diabetes Self-Management Training:   Learning Objective:  Patient will have a greater understanding of diabetes self-management. Patient education plan is to attend individual and/or group sessions per assessed needs and concerns.   Plan:   Patient Instructions  Continue to choose lean meats (chicken and fish). Bake rather than fried [name] often. Continue to choose increased vegetables. 1 serving of fresh fruit twice a day with a meal  would be fine. Continue to avoid added salt and choose foods that are not packaged with salt.   Expected Outcomes:  Demonstrated interest in learning. Expect positive outcomes  Education material provided:   If problems or questions, patient to contact team via:  Phone  Future DSME appointment: 3-4 months

## 2021-01-11 ENCOUNTER — Encounter: Payer: Self-pay | Admitting: Family Medicine

## 2021-01-11 ENCOUNTER — Other Ambulatory Visit: Payer: Self-pay

## 2021-01-11 ENCOUNTER — Ambulatory Visit (INDEPENDENT_AMBULATORY_CARE_PROVIDER_SITE_OTHER): Payer: Medicare Other | Admitting: Family Medicine

## 2021-01-11 VITALS — BP 140/64 | HR 67 | Temp 98.4°F | Ht 65.0 in | Wt 222.0 lb

## 2021-01-11 DIAGNOSIS — R7303 Prediabetes: Secondary | ICD-10-CM | POA: Diagnosis not present

## 2021-01-11 DIAGNOSIS — I10 Essential (primary) hypertension: Secondary | ICD-10-CM | POA: Diagnosis not present

## 2021-01-11 DIAGNOSIS — G8929 Other chronic pain: Secondary | ICD-10-CM

## 2021-01-11 DIAGNOSIS — R739 Hyperglycemia, unspecified: Secondary | ICD-10-CM | POA: Diagnosis not present

## 2021-01-11 DIAGNOSIS — Z09 Encounter for follow-up examination after completed treatment for conditions other than malignant neoplasm: Secondary | ICD-10-CM

## 2021-01-11 DIAGNOSIS — Z794 Long term (current) use of insulin: Secondary | ICD-10-CM

## 2021-01-11 DIAGNOSIS — E119 Type 2 diabetes mellitus without complications: Secondary | ICD-10-CM

## 2021-01-11 DIAGNOSIS — M67911 Unspecified disorder of synovium and tendon, right shoulder: Secondary | ICD-10-CM

## 2021-01-11 DIAGNOSIS — E1165 Type 2 diabetes mellitus with hyperglycemia: Secondary | ICD-10-CM

## 2021-01-11 DIAGNOSIS — M25511 Pain in right shoulder: Secondary | ICD-10-CM | POA: Diagnosis not present

## 2021-01-11 LAB — POCT URINALYSIS DIPSTICK
Bilirubin, UA: NEGATIVE
Glucose, UA: POSITIVE — AB
Ketones, UA: NEGATIVE
Leukocytes, UA: NEGATIVE
Nitrite, UA: NEGATIVE
Protein, UA: NEGATIVE
Spec Grav, UA: 1.015 (ref 1.010–1.025)
Urobilinogen, UA: 0.2 E.U./dL
pH, UA: 5 (ref 5.0–8.0)

## 2021-01-11 LAB — POCT GLYCOSYLATED HEMOGLOBIN (HGB A1C)
HbA1c POC (<> result, manual entry): 7.4 % (ref 4.0–5.6)
HbA1c, POC (controlled diabetic range): 7.4 % — AB (ref 0.0–7.0)
HbA1c, POC (prediabetic range): 7.4 % — AB (ref 5.7–6.4)
Hemoglobin A1C: 7.4 % — AB (ref 4.0–5.6)

## 2021-01-11 NOTE — Progress Notes (Signed)
Patient [facility]    Established Patient Office Visit  Subjective:  Patient ID: Brett [name], male    DOB: 09/09/1946  Age: 75 y.o. MRN: [id number]  CC:  Chief Complaint  Patient presents with  . Follow-up    3 month follow up     HPI Brett [name] is a 75 year old male who presents for Follow Up today.    Patient Active Problem List   Diagnosis Date Noted  . 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 Gastroesophageal reflux disease 11/23/2018  . Former smoker 08/15/2018  . FHx: heart disease 08/15/2018  . Diabetic retinopathy ([place]) 08/16/2017  . Type 2 diabetes mellitus with stage 2 chronic kidney disease, with long-term current use of insulin ([place]) 08/08/2017  . Gluttony 05/06/2017  . Obesity (BMI 30.0-34.9) 09/17/2015  . Hyperlipidemia associated with type 2 diabetes mellitus ([place]) 01/21/2014  . Vitamin D deficiency 01/21/2014  . Medication management 01/21/2014  . CKD stage 2 due to type 2 diabetes mellitus ([place])   . Hypertension   . OSA (obstructive sleep apnea)   . Gout   . Dilated cardiomyopathy ([place])   . Hemochromatosis 09/27/2011   Current Status: Since his last office visit, he is doing well with no complaints. He is accompanied by his grand-daughter today. He continues to have c/o right shoulder pain. He is taking OTC pain medication with minimal pain relief. He denies visual changes, chest pain, cough, shortness of breath, heart palpitations, and falls. He has occasional headaches and dizziness with position changes. Denies severe headaches, confusion, seizures, double vision, and blurred vision, nausea and vomiting. He has not been monitoring his blood glucose levels regularly lately. He denies fatigue, frequent urination, blurred vision, excessive hunger, excessive thirst, weight gain, weight loss, and poor wound healing.  He continues to check is feet regularly. He denies fevers, chills, recent infections, weight loss, and night sweats. Denies GI problems such as diarrhea, and constipation. He has no reports of blood in stools, dysuria and hematuria. No depression or anxiety reported today.  He is taking all medications as prescribed.    Past Medical History:  Diagnosis Date  . Acid reflux   . Allergy   . Colon polyp   . Diabetes mellitus without complication ([place])   . Dilated cardiomyopathy ([place])    non isch  . Gout   . Gout   . Hemochromatosis   . Hypertension   . OSA (obstructive sleep apnea)   . Urine frequency     Past Surgical History:  Procedure Laterality Date  . EYE SURGERY Bilateral    Cataract    Family History  Problem Relation Age of Onset  . Diabetes Father   . Diabetes Other   . Hypertension Other   . Hyperlipidemia Other   . Heart disease Other   . Cancer Maternal Uncle        brain  . Heart attack Brother        77  . Diabetes Brother     Social History   Socioeconomic History  . Marital status: Divorced    Spouse name: Not on file  . Number of children: Not on file  . Years of education: Not on file  . Highest education level: Not on file  Occupational History  . Not on file  Tobacco Use  . Smoking status: Former Smoker  Packs/day: 3.00    Years: 19.00    Pack years: 57.00    Types: Cigarettes    Start date: 10/24/1957    Quit date: 11/24/1976    Years since quitting: 44.1  . Smokeless tobacco: Never Used  Substance and Sexual Activity  . Alcohol use: Yes    Comment: Occasionally  . Drug use: No  . Sexual activity: Not on file  Other Topics Concern  . Not on file  Social History Narrative  . Not on file   Social Determinants of Health   Financial Resource Strain: Not on file  Food Insecurity: Not on file  Transportation Needs: Not on file  Physical Activity: Not on file  Stress: Not on file  Social Connections: Not on file  Intimate Partner  Violence: Not on file    Outpatient Medications Prior to Visit  Medication Sig Dispense Refill  . allopurinol (ZYLOPRIM) 300 MG tablet TAKE 1 TABLET BY MOUTH  DAILY TO PREVENT GOUT 90 tablet 3  . amLODipine (NORVASC) 10 MG tablet Take      1 tablet      at Night        for BP 90 tablet 0  . aspirin 81 MG tablet Take 81 mg by mouth daily.    Marland Kitchen atorvastatin (LIPITOR) 20 MG tablet TAKE 1 TABLET BY MOUTH  DAILY FOR CHOLESTEROL 90 tablet 3  . AZO-CRANBERRY PO Take by mouth.    . bisoprolol-hydrochlorothiazide (ZIAC) 10-6.25 MG tablet TAKE 1 TABLET BY MOUTH  DAILY FOR BLOOD PRESSURE 90 tablet 3  . Blood Glucose Monitoring Suppl DEVI Check blood sugar 3 times a day-DX-E11.22 1 each 0  . cetirizine (ZYRTEC) 10 MG tablet Take 10 mg by mouth daily.    . Cholecalciferol (VITAMIN D PO) Take 5,000 Units by mouth daily.    . D-Mannose 500 MG CAPS Take 1,500 mg by mouth.    . diphenhydramine-acetaminophen (TYLENOL PM) 25-500 MG TABS tablet Take 1 tablet by mouth at bedtime as needed.    . famotidine (PEPCID) 40 MG tablet Take 40 mg by mouth daily.    . furosemide (LASIX) 40 MG tablet TAKE 1 TABLET BY MOUTH  DAILY FOR BP, / FLUID  RETENTION / ANKLE SWELLING 90 tablet 3  . glipiZIDE (GLUCOTROL) 10 MG tablet TAKE 1 TABLET BY MOUTH 3  TIMES DAILY WITH MEALS FOR  DIABETES 270 tablet 3  . glucose blood (IGLUCOSE TEST STRIPS) test strip Check blood sugar 3 times daily-DX-E11.22 300 each 12  . insulin isophane & regular human (HUMULIN 70/30 MIX) (70-30) 100 UNIT/ML KwikPen Inject into subcutaneous skin 89min before meals, 45units breakfast, 40units lunch and 22units dinner. (Patient taking differently: Inject into subcutaneous skin 72min before meals, 45units breakfast, 25 units lunch and 20units dinner.) 135 mL 4  . Insulin Pen Needle 31G X 8 MM MISC Use one three times a day and as needed [place] 1000 each 3  . Lancets Ultra Fine MISC Check blood sugar 3 times a day-E11.22 300 each 3  . Loratadine (CLARITIN PO) Take by  mouth daily.    . magnesium oxide (MAG-OX) 400 MG tablet Take 400 mg by mouth 2 (two) times daily.     . Melatonin 10 MG CAPS Take by mouth.    . metFORMIN (GLUCOPHAGE-XR) 500 MG 24 hr tablet Take    2 tablets    2 x /day    With Meals for Diabetes 360 tablet 3  . minoxidil (LONITEN) 10 MG tablet Take  1 tablet Daily for BP 90 tablet 0  . montelukast (SINGULAIR) 10 MG tablet TAKE 1 TABLET BY MOUTH  DAILY FOR ALLERGIES (Patient taking differently: TAKE 1 TABLET BY MOUTH  DAILY FOR ALLERGIES) 90 tablet 3  . olmesartan (BENICAR) 40 MG tablet TAKE 1 TABLET BY MOUTH  DAILY FOR BLOOD PRESSURE 90 tablet 3  . tadalafil (CIALIS) 20 MG tablet Take 1/2 to 1  tablet every 2 to 3 days if needed for XXXX 30 tablet 3  . tamsulosin (FLOMAX) 0.4 MG CAPS capsule TAKE 1 CAPSULE BY MOUTH AT  BEDTIME FOR PROSTATE 90 capsule 3  . Continuous Blood Gluc Sensor (FREESTYLE LIBRE 14 DAY SENSOR) MISC Apply new sensor every 14 days. 1 each 11   No facility-administered medications prior to visit.    No Known Allergies  ROS Review of Systems  Constitutional: Negative.   HENT: Negative.   Eyes: Negative.   Respiratory: Negative.   Cardiovascular: Negative.   Gastrointestinal: Positive for abdominal distention (obese).  Endocrine: Negative.   Genitourinary: Negative.   Musculoskeletal: Positive for arthralgias (generalized joint pain).       Chronic right shoulder pain  Skin: Negative.   Allergic/Immunologic: Negative.   Neurological: Positive for dizziness (occasional ) and headaches (occasional ).  Hematological: Negative.   Psychiatric/Behavioral: Negative.       Objective:    Physical Exam Vitals and nursing note reviewed.  Constitutional:      Appearance: Normal appearance.  HENT:     Head: Normocephalic and atraumatic.     Nose: Nose normal.     Mouth/Throat:     Mouth: Mucous membranes are moist.     Pharynx: Oropharynx is clear.  Cardiovascular:     Rate and Rhythm: Normal rate and regular  rhythm.     Pulses: Normal pulses.     Heart sounds: Normal heart sounds.  Pulmonary:     Effort: Pulmonary effort is normal.     Breath sounds: Normal breath sounds.  Abdominal:     General: Bowel sounds are normal. There is distension (obese).     Palpations: Abdomen is soft.  Musculoskeletal:     Cervical back: Normal range of motion and neck supple.     Comments: Limited ROM in right shoulder  Skin:    General: Skin is warm and dry.  Neurological:     General: No focal deficit present.     Mental Status: He is alert and oriented to person, place, and time.  Psychiatric:        Mood and Affect: Mood normal.        Behavior: Behavior normal.        Thought Content: Thought content normal.        Judgment: Judgment normal.    BP 140/64 (BP Location: Left Arm, Patient Position: Sitting, Cuff Size: Large)   Pulse 67   Temp 98.4 F (36.9 C) (Temporal)   Ht 5\' 5"  (1.651 m)   Wt 222 lb (100.7 kg)   SpO2 97%   BMI 36.94 kg/m  Wt Readings from Last 3 Encounters:  01/11/21 222 lb (100.7 kg)  11/23/20 222 lb (100.7 kg)  10/12/20 218 lb (98.9 kg)     Health Maintenance Due  Topic Date Due  . COLONOSCOPY (Pts 45-34yrs Insurance coverage will need to be confirmed)  08/24/2020    There are no preventive care reminders to display for this patient.  Lab Results  Component Value Date   TSH 4.820 (H) 09/16/2020  Lab Results  Component Value Date   WBC 7.0 01/01/2021   HGB 13.7 01/01/2021   HCT 38.3 (L) 01/01/2021   MCV 87.2 01/01/2021   PLT 213 01/01/2021   Lab Results  Component Value Date   NA 138 09/16/2020   K 4.0 09/16/2020   CO2 23 09/16/2020   GLUCOSE 285 (H) 09/16/2020   BUN 20 09/16/2020   CREATININE 1.06 09/16/2020   BILITOT 0.7 09/16/2020   ALKPHOS 54 09/16/2020   AST 17 09/16/2020   ALT 27 09/16/2020   PROT 6.8 09/16/2020   ALBUMIN 4.5 09/16/2020   CALCIUM 9.7 09/16/2020   Lab Results  Component Value Date   CHOL 115 07/06/2020   Lab Results   Component Value Date   HDL 35 (L) 07/06/2020   Lab Results  Component Value Date   LDLCALC 56 07/06/2020   Lab Results  Component Value Date   TRIG 162 (H) 07/06/2020   Lab Results  Component Value Date   CHOLHDL 3.3 07/06/2020   Lab Results  Component Value Date   HGBA1C 7.4 (A) 01/11/2021   HGBA1C 7.4 01/11/2021   HGBA1C 7.4 (A) 01/11/2021   HGBA1C 7.4 (A) 01/11/2021      Assessment & Plan:   1. Controlled type 2 diabetes mellitus with hyperglycemia ([place]) He will continue medication as prescribed, to decrease foods/beverages high in sugars and carbs and follow Heart Healthy or DASH diet. Increase physical activity to at least 30 minutes cardio exercise daily.   - POCT Urinalysis Dipstick - HgB A1c  2. Hemoglobin A1C between 7% and 9% indicating borderline diabetic control Much improved! Hgb A1c stable at 7.4 today, from 10.0 on 09/16/2020. Monitor.  3. Hyperglycemia  4. Essential hypertension The current medical regimen is effective; blood pressure is stable at 140/64 today; continue present plan and medications as prescribed. He will continue to take medications as prescribed, to decrease high sodium intake, excessive alcohol intake, increase potassium intake, smoking cessation, and increase physical activity of at least 30 minutes of cardio activity daily. He will continue to follow Heart Healthy or DASH diet.  5. Rotator cuff disorder, right - AMB referral to orthopedics  6. Chronic right shoulder pain - AMB referral to orthopedics  7. Follow up He will follow up in 6 months.   No orders of the defined types were placed in this encounter.   Orders Placed This Encounter  Procedures  . AMB referral to orthopedics  . POCT Urinalysis Dipstick  . HgB A1c     Referral Orders     AMB referral to orthopedics   Kathe Becton, MSN, ANE, FNP-BC [facility] [address], [place] [address] [phone] fax  Problem List Items Addressed This Visit   None   Visit Diagnoses    Uncontrolled type 2 diabetes mellitus with hyperglycemia ([place])    -  Primary   Relevant Orders   POCT Urinalysis Dipstick (Completed)   HgB A1c (Completed)   Hemoglobin A1C between 7% and 9% indicating borderline diabetic control       Hyperglycemia       Essential hypertension       Rotator cuff disorder, right       Relevant Orders   AMB referral to orthopedics   Chronic right shoulder pain       Relevant Orders   AMB referral to orthopedics   Follow up  No orders of the defined types were placed in this encounter.   Follow-up: No follow-ups on file.    Azzie Glatter, FNP

## 2021-01-11 NOTE — Patient Instructions (Addendum)
 Shoulder Impingement Syndrome  Shoulder impingement syndrome is a condition that causes pain when connective tissues (tendons) surrounding the shoulder joint become pinched. These tendons are part of the group of muscles and tissues that help to stabilize the shoulder (rotator cuff). Beneath the rotator cuff is a fluid-filled sac (bursa) that allows the muscles and tendons to glide smoothly. The bursa may become swollen or irritated (bursitis). Bursitis, swelling in the rotator cuff tendons, or both conditions can decrease how much space is under a bone in the shoulder joint (acromion), resulting in impingement. What are the causes? Shoulder impingement syndrome may be caused by bursitis or swelling of the rotator cuff tendons, which may result from:  Repetitive overhead arm movements.  Falling onto the shoulder.  Weakness in the shoulder muscles. What increases the risk? You may be more likely to develop this condition if you:  Play sports that involve throwing, such as baseball.  Participate in sports such as tennis, volleyball, and swimming.  Work as a painter, carpenter, or fruit picker. Some people are also more likely to develop impingement syndrome because of the shape of their acromion bone. What are the signs or symptoms? The main symptom of this condition is pain on the front or side of the shoulder. The pain may:  Get worse when lifting or raising the arm.  Get worse at night.  Wake you up from sleeping.  Feel sharp when the shoulder is moved and then fade to an ache. Other symptoms may include:  Tenderness.  Stiffness.  Inability to raise the arm above shoulder level or behind the body.  Weakness. How is this diagnosed? This condition may be diagnosed based on:  Your symptoms and medical history.  A physical exam.  Imaging tests, such as: ? X-rays. ? MRI. ? Ultrasound. How is this treated? This condition may be treated by:  Resting your shoulder  and avoiding all activities that cause pain or put stress on the shoulder.  Icing your shoulder.  NSAIDs to help reduce pain and swelling.  One or more injections of medicines to numb the area and reduce inflammation.  Physical therapy.  Surgery. This may be needed if nonsurgical treatments have not helped. Surgery may involve repairing the rotator cuff, reshaping the acromion, or removing the bursa. Follow these instructions at home: Managing pain, stiffness, and swelling  If directed, put ice on the injured area. ? Put ice in a plastic bag. ? Place a towel between your skin and the bag. ? Leave the ice on for 20 minutes, 2-3 times a day.   Activity  Rest and return to your normal activities as told by your health care provider. Ask your health care provider what activities are safe for you.  Do exercises as told by your health care provider. General instructions  Do not use any products that contain nicotine or tobacco, such as cigarettes, e-cigarettes, and chewing tobacco. These can delay healing. If you need help quitting, ask your health care provider.  Ask your health care provider when it is safe for you to drive.  Take over-the-counter and prescription medicines only as told by your health care provider.  Keep all follow-up visits as told by your health care provider. This is important. How is this prevented?  Give your body time to rest between periods of activity.  Be safe and responsible while being active. This will help you avoid falls.  Maintain physical fitness, including strength and flexibility. Contact a health care provider if:    Your symptoms have not improved after 1-2 months of treatment and rest.  You cannot lift your arm away from your body. Summary  Shoulder impingement syndrome is a condition that causes pain when connective tissues (tendons) surrounding the shoulder joint become pinched.  The main symptom of this condition is pain on the front or  side of the shoulder.  This condition is usually treated with rest, ice, and pain medicines as needed. This information is not intended to replace advice given to you by your health care provider. Make sure you discuss any questions you have with your health care provider. Document Revised: 02/01/2019 Document Reviewed: 04/04/2018 Elsevier Patient Education  2021 [employer].  Urinary Frequency, Adult Urinary frequency means urinating more often than usual. You may urinate every 1-2 hours even though you drink a normal amount of fluid and do not have a bladder infection or condition. Although you urinate more often than normal, the total amount of urine produced in a day is normal. With urinary frequency, you may have an urgent need to urinate often. The stress and anxiety of needing to find a bathroom quickly can make this urge worse. This condition may go away on its own or you may need treatment at home. Home treatment may include bladder training, exercises, taking medicines, or making changes to your diet. Follow these instructions at home: Bladder health  Keep a bladder diary if told by your health care provider. Keep track of: ? What you eat and drink. ? How often you urinate. ? How much you urinate.  Follow a bladder training program if told by your health care provider. This may include: ? Learning to delay going to the bathroom. ? Double urinating (voiding). This helps if you are not completely emptying your bladder. ? Scheduled voiding.  Do Kegel exercises as told by your health care provider. Kegel exercises strengthen the muscles that help control urination, which may help the condition.   Eating and drinking  If told by your health care provider, make diet changes, such as: ? Avoiding caffeine. ? Drinking fewer fluids, especially alcohol. ? Not drinking in the evening. ? Avoiding foods or drinks that may irritate the bladder. These include coffee, tea, soda, artificial  sweeteners, citrus, tomato-based foods, and chocolate. ? Eating foods that help prevent or ease constipation. Constipation can make this condition worse. Your health care provider may recommend that you:  Drink enough fluid to keep your urine pale yellow.  Take over-the-counter or prescription medicines.  Eat foods that are high in fiber, such as beans, whole grains, and fresh fruits and vegetables.  Limit foods that are high in fat and processed sugars, such as fried or sweet foods. General instructions  Take over-the-counter and prescription medicines only as told by your health care provider.  Keep all follow-up visits as told by your health care provider. This is important. Contact a health care provider if:  You start urinating more often.  You feel pain or irritation when you urinate.  You notice blood in your urine.  Your urine looks cloudy.  You develop a fever.  You begin vomiting. Get help right away if:  You are unable to urinate. Summary  Urinary frequency means urinating more often than usual. With urinary frequency, you may urinate every 1-2 hours even though you drink a normal amount of fluid and do not have a bladder infection or other bladder condition.  Your health care provider may recommend that you keep a bladder diary, follow  a bladder training program, or make dietary changes.  If told by your health care provider, do Kegel exercises to strengthen the muscles that help control urination.  Take over-the-counter and prescription medicines only as told by your health care provider.  Contact a health care provider if your symptoms do not improve or get worse. This information is not intended to replace advice given to you by your health care provider. Make sure you discuss any questions you have with your health care provider. Document Revised: 04/19/2018 Document Reviewed: 04/19/2018 Elsevier Patient Education  2021 [employer].

## 2021-01-19 ENCOUNTER — Encounter: Payer: Self-pay | Admitting: Family Medicine

## 2021-01-25 ENCOUNTER — Ambulatory Visit: Payer: Medicare Other | Admitting: Orthopaedic Surgery

## 2021-02-03 ENCOUNTER — Encounter: Payer: Self-pay | Admitting: Orthopaedic Surgery

## 2021-02-03 ENCOUNTER — Ambulatory Visit (INDEPENDENT_AMBULATORY_CARE_PROVIDER_SITE_OTHER): Payer: Medicare Other | Admitting: Orthopaedic Surgery

## 2021-02-03 DIAGNOSIS — M25511 Pain in right shoulder: Secondary | ICD-10-CM | POA: Diagnosis not present

## 2021-02-03 DIAGNOSIS — G8929 Other chronic pain: Secondary | ICD-10-CM

## 2021-02-03 NOTE — Addendum Note (Signed)
Addended by: Robyne Peers on: 02/03/2021 02:17 PM   Modules accepted: Orders

## 2021-02-03 NOTE — Progress Notes (Signed)
Office Visit Note   Patient: Brett [name]           Date of Birth: [birth year]           MRN: [id number] Visit Date: 02/03/2021              Requested by: Azzie Glatter, [place],  [place] [id number] PCP: Azzie Glatter, FNP   Assessment & Plan: Visit Diagnoses:  1. Chronic right shoulder pain     Plan: Given patient's weakness on exam and significant pain in the shoulder recommend MRI to evaluate cartilage.  This is for treatment planning.  Would not recommend cortisone injection due to his poorly controlled diabetes.  In fact he has a deficient rotator cuff and his cartilage is maintained would recommend therapy versus if he has an arthritic shoulder would recommend right shoulder replacement.  Questions were encouraged and answered by Dr. Ninfa Linden and my self.   Follow-Up Instructions: Return AFTER MRI.   Orders:  No orders of the defined types were placed in this encounter.  No orders of the defined types were placed in this encounter.     Procedures: No procedures performed   Clinical Data: No additional findings.   Subjective: Chief Complaint  Patient presents with  . Right Shoulder - Pain    HPI Brett [name] is a very pleasant 75 year old male comes in today for right shoulder pain that is been ongoing for years.  A had x-rays of the shoulder performed in December and was told nothing was really wrong with his shoulder.  He continues to have severe pain in the right shoulder decreased range of motion and decreased strength.  He notes that he picked up a boat some 2 years ago and put on a trailer and had pain in his shoulder since that time.  Radiographs are reviewed on the current system and show films from December 2021 which shows by the radiologist read is slightly high riding right humeral head with mild arthritic changes.  Per my read I feel that he definitely has high riding shoulder with moderate arthritic changes.  Patient is  diabetic. Last hemoglobin A1c was 7.4 prior to that his hemoglobin A1c was 10.0. Review of Systems No fevers or chills.  Objective: Vital Signs: There were no vitals taken for this visit.  Physical Exam General: Well-developed well-nourished male in no acute distress mood and affect appropriate. Psych: Alert and oriented x3  Ortho Exam Left shoulder full range of motion without pain.  5 out of 5 strength throughout left shoulder exam negative impingement left shoulder.  Right shoulder positive impingement positive crepitus with passive range of motion as well as weakness 4 out of 5 strength with external rotation against resistance and empty can test is negative on the right.  Specialty Comments:  No specialty comments available.  Imaging: No results found.   PMFS History: Patient Active Problem List   Diagnosis Date Noted  . 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 Gastroesophageal reflux disease 11/23/2018  . Former smoker 08/15/2018  . FHx: heart disease 08/15/2018  . Diabetic retinopathy ([place]) 08/16/2017  . Type 2 diabetes mellitus with stage 2 chronic kidney disease, with long-term current use of insulin ([place]) 08/08/2017  . Gluttony 05/06/2017  . Obesity (BMI 30.0-34.9) 09/17/2015  . Hyperlipidemia associated with type 2 diabetes mellitus ([place]) 01/21/2014  . Vitamin D deficiency  01/21/2014  . Medication management 01/21/2014  . CKD stage 2 due to type 2 diabetes mellitus ([place])   . Hypertension   . OSA (obstructive sleep apnea)   . Gout   . Dilated cardiomyopathy ([place])   . Hemochromatosis 09/27/2011   Past Medical History:  Diagnosis Date  . Acid reflux   . Allergy   . Colon polyp   . Diabetes mellitus without complication ([place])   . Dilated cardiomyopathy ([place])    non isch  . Gout   . Gout   . Hemochromatosis   . Hypertension   . OSA (obstructive sleep apnea)    . Urine frequency     Family History  Problem Relation Age of Onset  . Diabetes Father   . Diabetes Other   . Hypertension Other   . Hyperlipidemia Other   . Heart disease Other   . Cancer Maternal Uncle        brain  . Heart attack Brother        29  . Diabetes Brother     Past Surgical History:  Procedure Laterality Date  . EYE SURGERY Bilateral    Cataract   Social History   Occupational History  . Not on file  Tobacco Use  . Smoking status: Former Smoker    Packs/day: 3.00    Years: 19.00    Pack years: 57.00    Types: Cigarettes    Start date: 10/24/1957    Quit date: 11/24/1976    Years since quitting: 44.2  . Smokeless tobacco: Never Used  Substance and Sexual Activity  . Alcohol use: Yes    Comment: Occasionally  . Drug use: No  . Sexual activity: Not on file

## 2021-02-16 ENCOUNTER — Ambulatory Visit
Admission: RE | Admit: 2021-02-16 | Discharge: 2021-02-16 | Disposition: A | Discharge status: Home / Self Care | Payer: Medicare Other | Source: Ambulatory Visit | Attending: Physician Assistant | Admitting: Physician Assistant

## 2021-02-16 DIAGNOSIS — G8929 Other chronic pain: Secondary | ICD-10-CM

## 2021-02-16 DIAGNOSIS — M25511 Pain in right shoulder: Secondary | ICD-10-CM

## 2021-02-16 IMAGING — MR MR SHOULDER*R* W/O CM
5 series · 36 of 40 positions shown · non-contrast
Comparison: None.

CLINICAL DATA: Right shoulder pain for 2 years.  No known injury.

EXAM:
MRI OF THE RIGHT SHOULDER WITHOUT CONTRAST
TECHNIQUE: Multiplanar, multisequence MR imaging of the shoulder was performed.
No intravenous contrast was administered.

[Series 3: T2 fat-sat · axial · 4.0mm · 0.55mm/px · z∈[-56,+55]mm · 9 of 24 slices shown (1 of 3)]
[im 1/24]
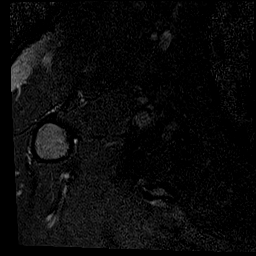
[im 3/24]
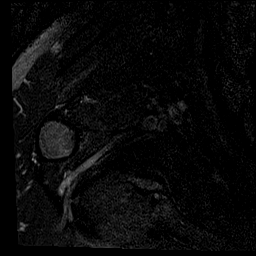
[im 6/24]
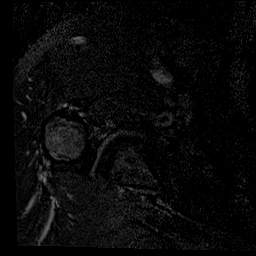
[im 9/24]
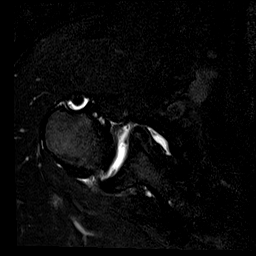
[im 12/24]
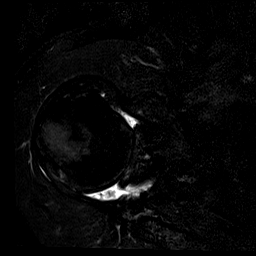
[im 15/24]
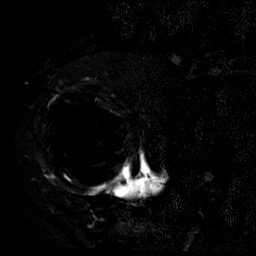
[im 18/24]
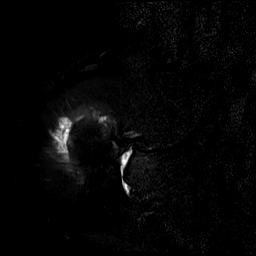
[im 21/24]
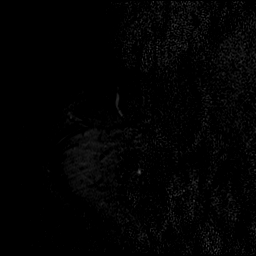
[im 24/24]
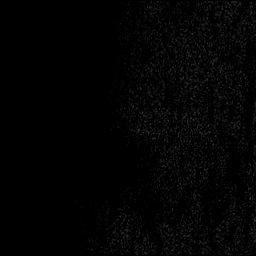

[Series 4: T1 · oblique · 4.0mm · 0.29mm/px · 5 of 25 slices shown]
[im 1/25]
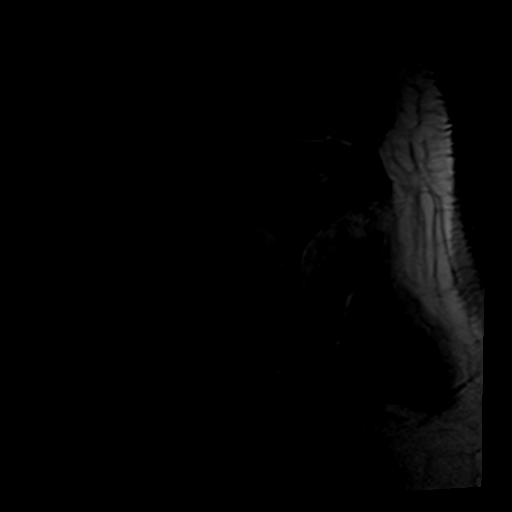
[im 4/25]
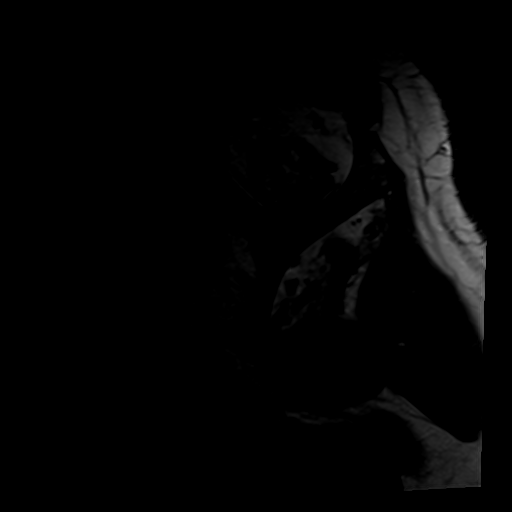
[im 7/25]
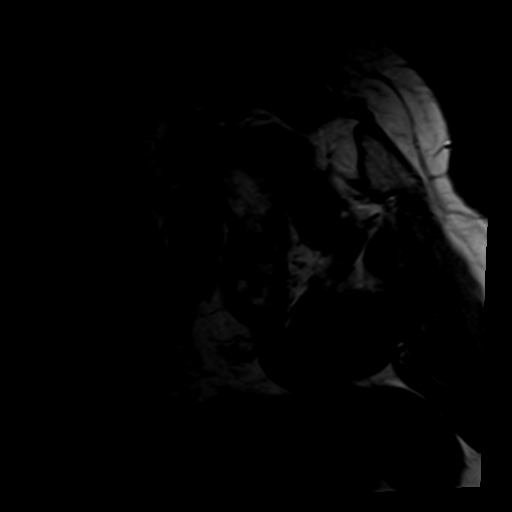
[im 10/25]
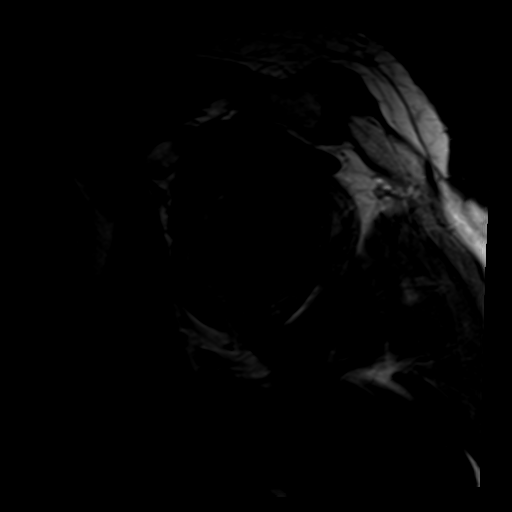
[im 16/25]
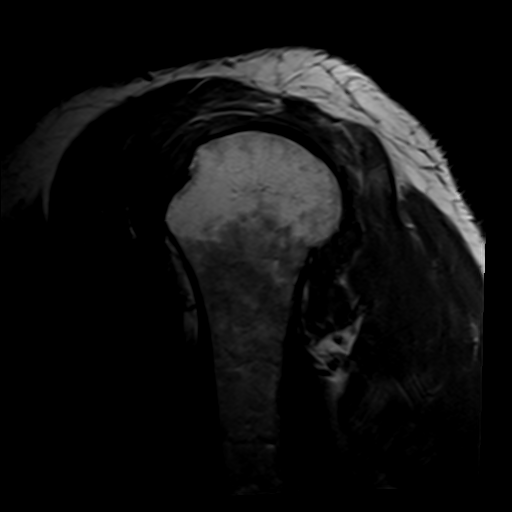

[Series 5: T2 fat-sat · oblique · 4.0mm · 0.59mm/px · 8 of 24 slices shown (2 of 3)]
[im 1/24]
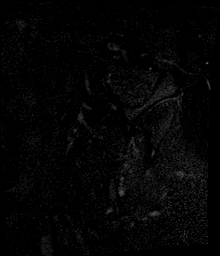
[im 4/24]
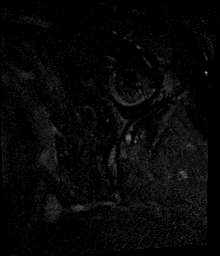
[im 7/24]
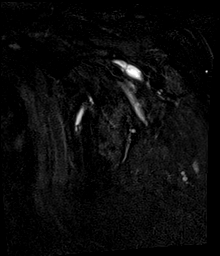
[im 10/24]
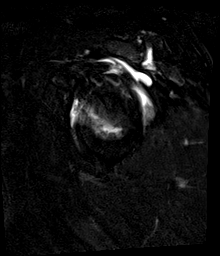
[im 14/24]
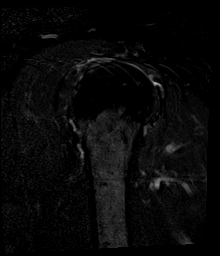
[im 17/24]
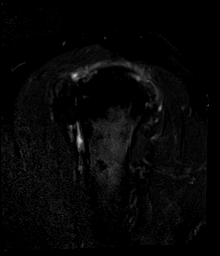
[im 20/24]
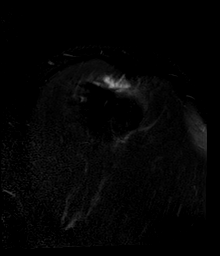
[im 24/24]
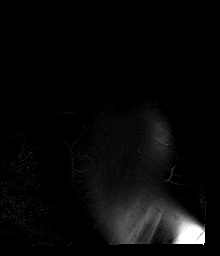

[Series 6: T2 fat-sat · oblique · 4.0mm · 0.59mm/px · 7 of 21 slices shown (3 of 3)]
[im 1/21]
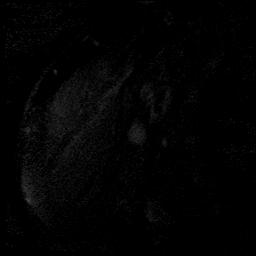
[im 4/21]
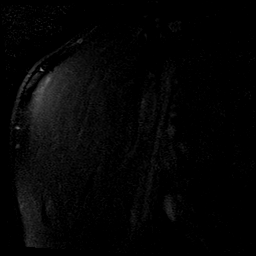
[im 7/21]
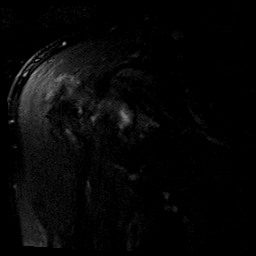
[im 11/21]
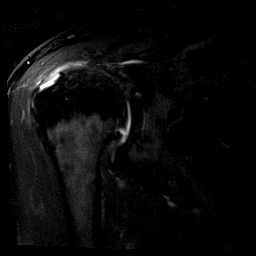
[im 14/21]
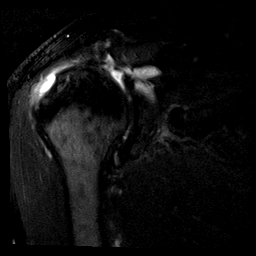
[im 17/21]
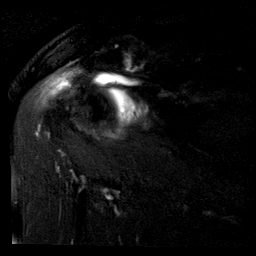
[im 21/21]
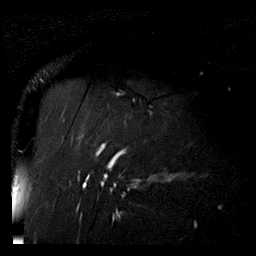

[Series 7: PD · oblique · 4.0mm · 0.29mm/px · 7 of 21 slices shown]
[im 1/21]
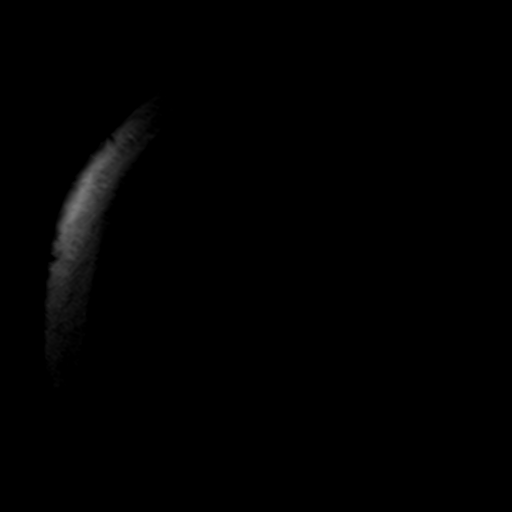
[im 4/21]
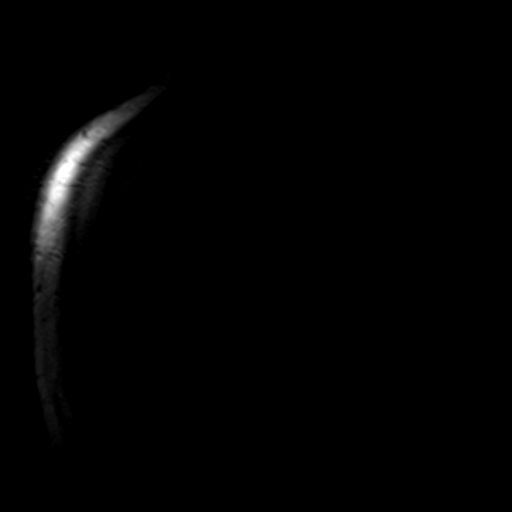
[im 7/21]
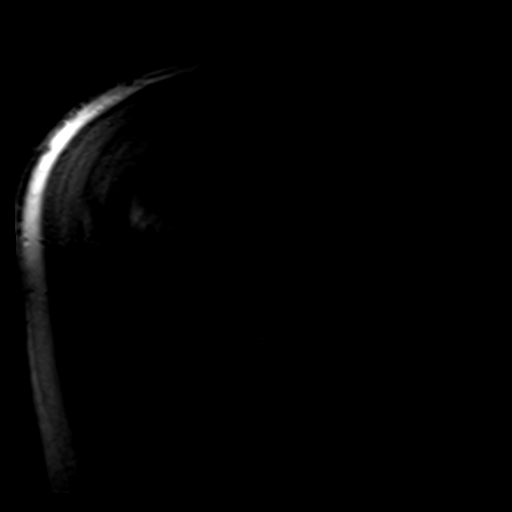
[im 11/21]
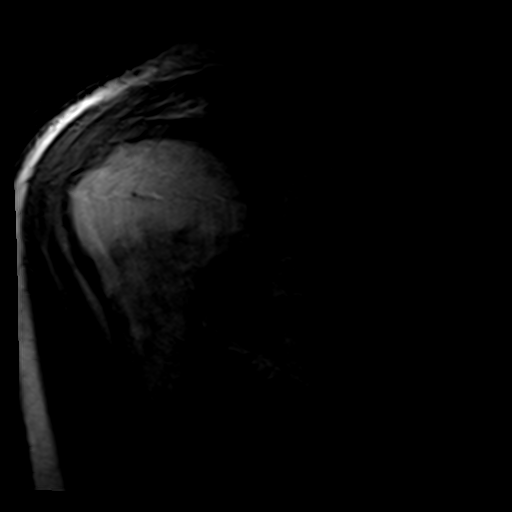
[im 14/21]
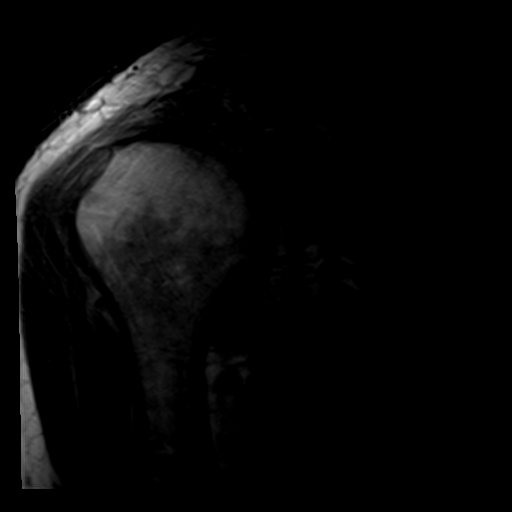
[im 17/21]
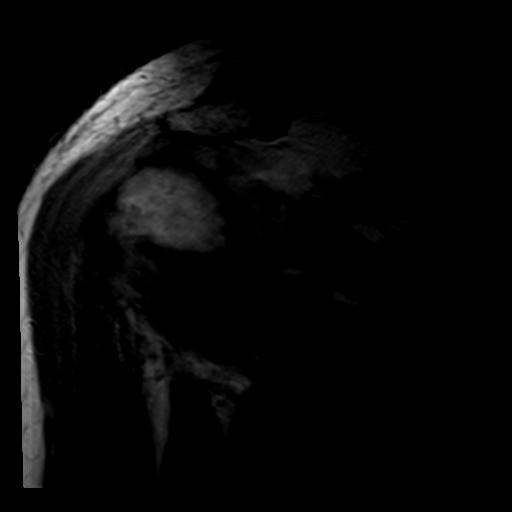
[im 21/21]
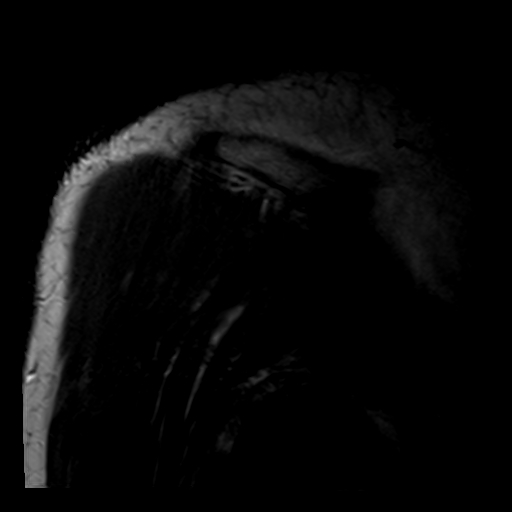

[36 of 40 positions shown; findings below may reference images not displayed]

FINDINGS: Examination is somewhat limited by scratch the examination is
limited by patient motion.

Rotator cuff: The patient has complete supraspinatus and
infraspinatus tendon tears with retraction just medial to the
glenohumeral joint. Although not well seen due to patient motion,
the subscapularis tendon appears completely torn with 1-2 cm of
retraction.

Muscles: There is severe atrophy of the supraspinatus, infraspinatus
and subscapularis.

Biceps long head: Motion limits evaluation but the tendon is likely
completely torn.

Acromioclavicular Joint: Moderate to moderately severe
osteoarthritis. Type 1 acromion. There is fluid in the
subacromial/subdeltoid bursa.

Glenohumeral Joint: The humeral head is high-riding. Cartilage of
the glenohumeral joint appears thinned.

Labrum:  Poorly seen due to motion but appears markedly degenerated.

Bones:  No fracture, contusion or worrisome lesion.

Other: None.
IMPRESSION: Complete supraspinatus and likely subscapularis tendon tears. There
is severe atrophy of all the muscle bellies. The supraspinatus and
infraspinatus are retracted medial to the glenohumeral joint and
there is 1-2 cm of subscapularis retraction.

Likely complete tear of the long head of biceps from the superior
labrum. Motion limits evaluation.

Acromioclavicular osteoarthritis.

Subacromial/subdeltoid bursitis.

## 2021-02-22 ENCOUNTER — Encounter: Payer: Self-pay | Admitting: Orthopaedic Surgery

## 2021-02-22 ENCOUNTER — Ambulatory Visit (INDEPENDENT_AMBULATORY_CARE_PROVIDER_SITE_OTHER): Payer: Medicare Other | Admitting: Orthopaedic Surgery

## 2021-02-22 DIAGNOSIS — M75121 Complete rotator cuff tear or rupture of right shoulder, not specified as traumatic: Secondary | ICD-10-CM | POA: Diagnosis not present

## 2021-02-22 DIAGNOSIS — M25511 Pain in right shoulder: Secondary | ICD-10-CM | POA: Diagnosis not present

## 2021-02-22 DIAGNOSIS — G8929 Other chronic pain: Secondary | ICD-10-CM | POA: Diagnosis not present

## 2021-02-22 NOTE — Progress Notes (Signed)
The patient comes in today to go over MRI of his right shoulder.  He has significant and profound weakness in the right shoulder and pain.  He is unable to perform activities day living due to his shoulder weakness and pain this been going on for a very long period of time.  He is a diabetic and reports not the best control of his diabetes.  However recent hemoglobin A1c was down to 7.4 from 10 5 months ago.  Examination of his right shoulder shows significant weakness with abduction and external rotation.  The MRI is reviewed with he and his wife.  He has full-thickness retracted tears of the supraspinatus and subscapularis tendons.  It is retracted medial to the glenoid.  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  There is moderate to severe arthritis of the [facility] joint and thinning of the cartilage and glenoid.  The humeral head is definitely high riding.  At this point his surgical options are limited.  It does appear that his blood glucose is getting under better control but he needs to keep working on that.  We can certainly refer him to my partner Dr. Marlou Sa to consider shoulder replacement surgery and I gave the patient information about make an appoint with Dr. Marlou Sa if they decide to pursue that route.  All question concerns were answered and addressed.  Follow-up with me is as needed.

## 2021-02-27 ENCOUNTER — Other Ambulatory Visit: Payer: Medicare Other

## 2021-03-01 ENCOUNTER — Other Ambulatory Visit: Payer: Self-pay | Admitting: Adult Health

## 2021-03-01 DIAGNOSIS — I10 Essential (primary) hypertension: Secondary | ICD-10-CM

## 2021-03-03 ENCOUNTER — Ambulatory Visit (INDEPENDENT_AMBULATORY_CARE_PROVIDER_SITE_OTHER): Payer: Medicare Other | Admitting: Orthopedic Surgery

## 2021-03-03 DIAGNOSIS — M12811 Other specific arthropathies, not elsewhere classified, right shoulder: Secondary | ICD-10-CM | POA: Diagnosis not present

## 2021-03-04 ENCOUNTER — Encounter: Payer: Self-pay | Admitting: Orthopedic Surgery

## 2021-03-04 NOTE — Progress Notes (Signed)
Office Visit Note   Patient: Brett [name]           Date of Birth: 03/15/1946           MRN: [id number] Visit Date: 03/03/2021 Requested by: No referring provider defined for this encounter. PCP: Azzie Glatter, FNP (Inactive)  Subjective: Chief Complaint  Patient presents with  . Right Shoulder - Pain    HPI: Brett [name] is a 75 year old patient with right shoulder pain.  Has had long history of right shoulder pain and diminished function.  This has been going on for several years.  Has never had an injection but he does have diabetes which is marginally well controlled.  His daughter is with him here today he describes general decrease in activity due to the shoulder pain.  He would like to do more things outside in terms of cutting the grass and also has a boat which she would like to be more active with.  Left shoulder does not give him any problem.  He is right-hand dominant.  Shoulder pain does wake him from sleep at night.  Takes him several days to cut the grass.              ROS: All systems reviewed are negative as they relate to the chief complaint within the history of present illness.  Patient denies  fevers or chills.   Assessment & Plan: Visit Diagnoses:  1. Rotator cuff arthropathy of right shoulder     Plan: Impression is right shoulder rotator cuff arthropathy with diminished function and some pain.  Discussed today operative and nonoperative treatment options for this problem.  He is a reasonable candidate for reverse shoulder replacement and with the use of models the surgery is discussed including the risk and benefits.  I do not think mentally he is quite there yet to consider surgical intervention.  He is never had really any hospitalization in his life.  He is somewhat apprehensive at the thought of surgery.  He is functional with a good left shoulder and a marginal right shoulder for most of his activities of daily living.  He is going to consider his options and call  back if he wants to proceed with any type of intervention.  Follow-up as needed.  All questions answered about surgery with [place] and his daughter present.  Follow-Up Instructions: Return if symptoms worsen or fail to improve.   Orders:  No orders of the defined types were placed in this encounter.  No orders of the defined types were placed in this encounter.     Procedures: No procedures performed   Clinical Data: No additional findings.  Objective: Vital Signs: There were no vitals taken for this visit.  Physical Exam:   Constitutional: Patient appears well-developed HEENT:  Head: Normocephalic Eyes:EOM are normal Neck: Normal range of motion Cardiovascular: Normal rate Pulmonary/chest: Effort normal Neurologic: Patient is alert Skin: Skin is warm Psychiatric: Patient has normal mood and affect    Ortho Exam: Ortho exam demonstrates 5 out of 5 grip EPL FPL interosseous are/extension bicep triceps and deltoid strength.  Passive range of motion of the right shoulder is 45/100/160.  Active range of motion of the right shoulder is 20/70/75.  Rotator cuff strength is predictably weak to infraspinatus supraspinatus and subscap testing.  Deltoid is functional.  There is a little bit of coarse grinding and crepitus with active and passive range of motion of the right shoulder.  MRI scan is reviewed and shows  retracted rotator cuff tears to the glenoid with high riding humeral head consistent with right shoulder rotator cuff arthropathy.   Specialty Comments:  No specialty comments available.  Imaging: No results found.   PMFS History: Patient Active Problem List   Diagnosis Date Noted  . 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 Gastroesophageal reflux disease 11/23/2018  . Former smoker 08/15/2018  . FHx: heart disease 08/15/2018  . Diabetic retinopathy ([place]) 08/16/2017   . Type 2 diabetes mellitus with stage 2 chronic kidney disease, with long-term current use of insulin ([place]) 08/08/2017  . Gluttony 05/06/2017  . Obesity (BMI 30.0-34.9) 09/17/2015  . Hyperlipidemia associated with type 2 diabetes mellitus ([place]) 01/21/2014  . Vitamin D deficiency 01/21/2014  . Medication management 01/21/2014  . CKD stage 2 due to type 2 diabetes mellitus ([place])   . Hypertension   . OSA (obstructive sleep apnea)   . Gout   . Dilated cardiomyopathy ([place])   . Hemochromatosis 09/27/2011   Past Medical History:  Diagnosis Date  . Acid reflux   . Allergy   . Colon polyp   . Diabetes mellitus without complication ([place])   . Dilated cardiomyopathy ([place])    non isch  . Gout   . Gout   . Hemochromatosis   . Hypertension   . OSA (obstructive sleep apnea)   . Urine frequency     Family History  Problem Relation Age of Onset  . Diabetes Father   . Diabetes Other   . Hypertension Other   . Hyperlipidemia Other   . Heart disease Other   . Cancer Maternal Uncle        brain  . Heart attack Brother        28  . Diabetes Brother     Past Surgical History:  Procedure Laterality Date  . EYE SURGERY Bilateral    Cataract   Social History   Occupational History  . Not on file  Tobacco Use  . Smoking status: Former Smoker    Packs/day: 3.00    Years: 19.00    Pack years: 57.00    Types: Cigarettes    Start date: 10/24/1957    Quit date: 11/24/1976    Years since quitting: 44.3  . Smokeless tobacco: Never Used  Substance and Sexual Activity  . Alcohol use: Yes    Comment: Occasionally  . Drug use: No  . Sexual activity: Not on file

## 2021-03-25 ENCOUNTER — Emergency Department (HOSPITAL_COMMUNITY): Payer: Medicare Other

## 2021-03-25 ENCOUNTER — Inpatient Hospital Stay (HOSPITAL_COMMUNITY)
Admission: EM | Admit: 2021-03-25 | Discharge: 2021-03-31 | DRG: 286 | Disposition: A | Discharge status: Home / Self Care | Payer: Medicare Other | Attending: Family Medicine | Admitting: Family Medicine

## 2021-03-25 DIAGNOSIS — R0600 Dyspnea, unspecified: Secondary | ICD-10-CM | POA: Diagnosis not present

## 2021-03-25 DIAGNOSIS — J811 Chronic pulmonary edema: Secondary | ICD-10-CM

## 2021-03-25 DIAGNOSIS — I447 Left bundle-branch block, unspecified: Secondary | ICD-10-CM | POA: Diagnosis not present

## 2021-03-25 DIAGNOSIS — I130 Hypertensive heart and chronic kidney disease with heart failure and stage 1 through stage 4 chronic kidney disease, or unspecified chronic kidney disease: Secondary | ICD-10-CM | POA: Diagnosis not present

## 2021-03-25 DIAGNOSIS — E1122 Type 2 diabetes mellitus with diabetic chronic kidney disease: Secondary | ICD-10-CM | POA: Diagnosis present

## 2021-03-25 DIAGNOSIS — I10 Essential (primary) hypertension: Secondary | ICD-10-CM | POA: Diagnosis not present

## 2021-03-25 DIAGNOSIS — R0689 Other abnormalities of breathing: Secondary | ICD-10-CM | POA: Diagnosis not present

## 2021-03-25 DIAGNOSIS — K219 Gastro-esophageal reflux disease without esophagitis: Secondary | ICD-10-CM | POA: Diagnosis present

## 2021-03-25 DIAGNOSIS — E1165 Type 2 diabetes mellitus with hyperglycemia: Secondary | ICD-10-CM | POA: Diagnosis present

## 2021-03-25 DIAGNOSIS — I509 Heart failure, unspecified: Secondary | ICD-10-CM | POA: Diagnosis not present

## 2021-03-25 DIAGNOSIS — J9601 Acute respiratory failure with hypoxia: Secondary | ICD-10-CM | POA: Diagnosis present

## 2021-03-25 DIAGNOSIS — E669 Obesity, unspecified: Secondary | ICD-10-CM | POA: Diagnosis present

## 2021-03-25 DIAGNOSIS — F05 Delirium due to known physiological condition: Secondary | ICD-10-CM | POA: Diagnosis not present

## 2021-03-25 DIAGNOSIS — I429 Cardiomyopathy, unspecified: Secondary | ICD-10-CM

## 2021-03-25 DIAGNOSIS — Z7982 Long term (current) use of aspirin: Secondary | ICD-10-CM

## 2021-03-25 DIAGNOSIS — I5020 Unspecified systolic (congestive) heart failure: Secondary | ICD-10-CM

## 2021-03-25 DIAGNOSIS — E1169 Type 2 diabetes mellitus with other specified complication: Secondary | ICD-10-CM | POA: Diagnosis present

## 2021-03-25 DIAGNOSIS — E876 Hypokalemia: Secondary | ICD-10-CM | POA: Diagnosis not present

## 2021-03-25 DIAGNOSIS — Z794 Long term (current) use of insulin: Secondary | ICD-10-CM

## 2021-03-25 DIAGNOSIS — R0902 Hypoxemia: Secondary | ICD-10-CM | POA: Diagnosis not present

## 2021-03-25 DIAGNOSIS — E785 Hyperlipidemia, unspecified: Secondary | ICD-10-CM | POA: Diagnosis present

## 2021-03-25 DIAGNOSIS — Z09 Encounter for follow-up examination after completed treatment for conditions other than malignant neoplasm: Secondary | ICD-10-CM

## 2021-03-25 DIAGNOSIS — Z20822 Contact with and (suspected) exposure to covid-19: Secondary | ICD-10-CM | POA: Diagnosis not present

## 2021-03-25 DIAGNOSIS — R9431 Abnormal electrocardiogram [ECG] [EKG]: Secondary | ICD-10-CM

## 2021-03-25 DIAGNOSIS — I4581 Long QT syndrome: Secondary | ICD-10-CM | POA: Diagnosis present

## 2021-03-25 DIAGNOSIS — J810 Acute pulmonary edema: Secondary | ICD-10-CM

## 2021-03-25 DIAGNOSIS — I420 Dilated cardiomyopathy: Secondary | ICD-10-CM | POA: Diagnosis present

## 2021-03-25 DIAGNOSIS — I110 Hypertensive heart disease with heart failure: Secondary | ICD-10-CM | POA: Diagnosis not present

## 2021-03-25 DIAGNOSIS — Z833 Family history of diabetes mellitus: Secondary | ICD-10-CM

## 2021-03-25 DIAGNOSIS — I5043 Acute on chronic combined systolic (congestive) and diastolic (congestive) heart failure: Secondary | ICD-10-CM | POA: Diagnosis present

## 2021-03-25 DIAGNOSIS — Z87891 Personal history of nicotine dependence: Secondary | ICD-10-CM

## 2021-03-25 DIAGNOSIS — Z7984 Long term (current) use of oral hypoglycemic drugs: Secondary | ICD-10-CM

## 2021-03-25 DIAGNOSIS — Z808 Family history of malignant neoplasm of other organs or systems: Secondary | ICD-10-CM

## 2021-03-25 DIAGNOSIS — R778 Other specified abnormalities of plasma proteins: Secondary | ICD-10-CM | POA: Diagnosis present

## 2021-03-25 DIAGNOSIS — Z79899 Other long term (current) drug therapy: Secondary | ICD-10-CM

## 2021-03-25 DIAGNOSIS — J9811 Atelectasis: Secondary | ICD-10-CM | POA: Diagnosis not present

## 2021-03-25 DIAGNOSIS — I517 Cardiomegaly: Secondary | ICD-10-CM

## 2021-03-25 DIAGNOSIS — N182 Chronic kidney disease, stage 2 (mild): Secondary | ICD-10-CM | POA: Diagnosis present

## 2021-03-25 DIAGNOSIS — I2510 Atherosclerotic heart disease of native coronary artery without angina pectoris: Secondary | ICD-10-CM | POA: Diagnosis present

## 2021-03-25 DIAGNOSIS — N400 Enlarged prostate without lower urinary tract symptoms: Secondary | ICD-10-CM | POA: Diagnosis present

## 2021-03-25 DIAGNOSIS — J80 Acute respiratory distress syndrome: Secondary | ICD-10-CM | POA: Diagnosis not present

## 2021-03-25 DIAGNOSIS — G4733 Obstructive sleep apnea (adult) (pediatric): Secondary | ICD-10-CM | POA: Diagnosis present

## 2021-03-25 DIAGNOSIS — M109 Gout, unspecified: Secondary | ICD-10-CM | POA: Diagnosis present

## 2021-03-25 DIAGNOSIS — Z8249 Family history of ischemic heart disease and other diseases of the circulatory system: Secondary | ICD-10-CM

## 2021-03-25 DIAGNOSIS — I5021 Acute systolic (congestive) heart failure: Secondary | ICD-10-CM

## 2021-03-25 DIAGNOSIS — Z6839 Body mass index (BMI) 39.0-39.9, adult: Secondary | ICD-10-CM

## 2021-03-25 LAB — CBC WITH DIFFERENTIAL/PLATELET
Abs Immature Granulocytes: 0.09 10*3/uL — ABNORMAL HIGH (ref 0.00–0.07)
Basophils Absolute: 0 10*3/uL (ref 0.0–0.1)
Basophils Relative: 0 %
Eosinophils Absolute: 0.2 10*3/uL (ref 0.0–0.5)
Eosinophils Relative: 1 %
HCT: 38.4 % — ABNORMAL LOW (ref 39.0–52.0)
Hemoglobin: 13.5 g/dL (ref 13.0–17.0)
Immature Granulocytes: 1 %
Lymphocytes Relative: 12 %
Lymphs Abs: 1.2 10*3/uL (ref 0.7–4.0)
MCH: 32.2 pg (ref 26.0–34.0)
MCHC: 35.2 g/dL (ref 30.0–36.0)
MCV: 91.6 fL (ref 80.0–100.0)
Monocytes Absolute: 0.9 10*3/uL (ref 0.1–1.0)
Monocytes Relative: 9 %
Neutro Abs: 8 10*3/uL — ABNORMAL HIGH (ref 1.7–7.7)
Neutrophils Relative %: 77 %
Platelets: 172 10*3/uL (ref 150–400)
RBC: 4.19 MIL/uL — ABNORMAL LOW (ref 4.22–5.81)
RDW: 13.7 % (ref 11.5–15.5)
WBC: 10.4 10*3/uL (ref 4.0–10.5)
nRBC: 0 % (ref 0.0–0.2)

## 2021-03-25 LAB — I-STAT ARTERIAL BLOOD GAS, ED
Acid-base deficit: 1 mmol/L (ref 0.0–2.0)
Bicarbonate: 25.8 mmol/L (ref 20.0–28.0)
Calcium, Ion: 1.27 mmol/L (ref 1.15–1.40)
HCT: 37 % — ABNORMAL LOW (ref 39.0–52.0)
Hemoglobin: 12.6 g/dL — ABNORMAL LOW (ref 13.0–17.0)
O2 Saturation: 100 %
Patient temperature: 98.6
Potassium: 3.6 mmol/L (ref 3.5–5.1)
Sodium: 139 mmol/L (ref 135–145)
TCO2: 27 mmol/L (ref 22–32)
pCO2 arterial: 49.7 mmHg — ABNORMAL HIGH (ref 32.0–48.0)
pH, Arterial: 7.323 — ABNORMAL LOW (ref 7.350–7.450)
pO2, Arterial: 228 mmHg — ABNORMAL HIGH (ref 83.0–108.0)

## 2021-03-25 LAB — COMPREHENSIVE METABOLIC PANEL
ALT: 28 U/L (ref 0–44)
AST: 20 U/L (ref 15–41)
Albumin: 3.9 g/dL (ref 3.5–5.0)
Alkaline Phosphatase: 33 U/L — ABNORMAL LOW (ref 38–126)
Anion gap: 7 (ref 5–15)
BUN: 20 mg/dL (ref 8–23)
CO2: 27 mmol/L (ref 22–32)
Calcium: 8.9 mg/dL (ref 8.9–10.3)
Chloride: 103 mmol/L (ref 98–111)
Creatinine, Ser: 1.24 mg/dL (ref 0.61–1.24)
GFR, Estimated: >60 mL/min (ref >60)
Glucose, Bld: 280 mg/dL — ABNORMAL HIGH (ref 70–99)
Potassium: 3.9 mmol/L (ref 3.5–5.1)
Sodium: 137 mmol/L (ref 135–145)
Total Bilirubin: 1.1 mg/dL (ref 0.3–1.2)
Total Protein: 6.4 g/dL — ABNORMAL LOW (ref 6.5–8.1)

## 2021-03-25 LAB — BRAIN NATRIURETIC PEPTIDE: B Natriuretic Peptide: 124.6 pg/mL — ABNORMAL HIGH (ref 0.0–100.0)

## 2021-03-25 IMAGING — DX DG CHEST 1V PORT
1 series · 1 of 1 positions shown · non-contrast
Comparison: Chest radiograph [DATE]

CLINICAL DATA: Hypoxia and difficulty breathing for 2 days.

EXAM:
PORTABLE CHEST 1 VIEW

[chest]
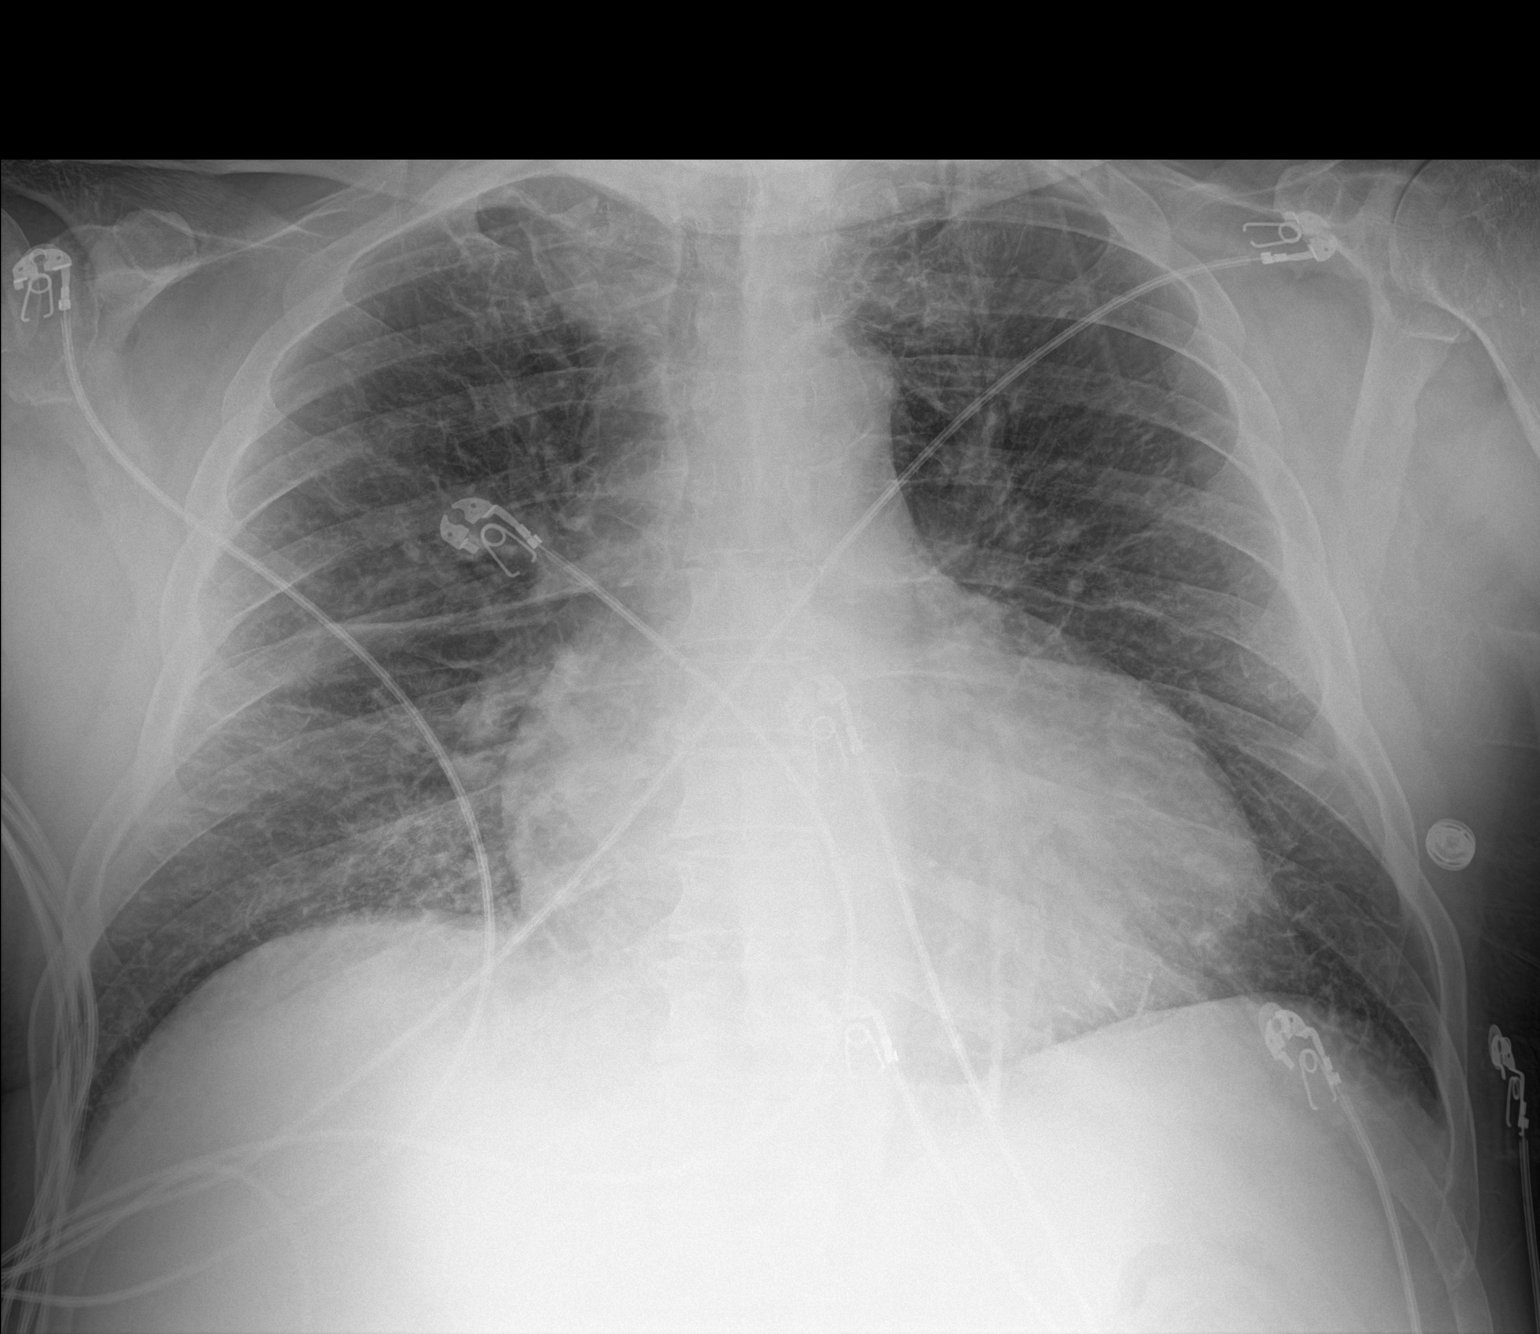

[1 of 1 positions shown; findings below may reference images not displayed]

FINDINGS: The heart is enlarged. Mild to moderate pulmonary edema. Trace fluid
in the right minor fissure. No large subpulmonic effusion. Bibasilar
atelectasis without confluent airspace disease. No pneumothorax. No
acute osseous abnormalities are seen.
IMPRESSION: CHF.  Cardiomegaly with pulmonary edema and fluid in the fissures.

## 2021-03-25 MED ORDER — FUROSEMIDE 10 MG/ML IJ SOLN
60.0000 mg | Freq: Once | INTRAMUSCULAR | Status: AC
  Administered 2021-03-25: 60 mg via INTRAVENOUS
  Filled 2021-03-25: qty 6

## 2021-03-25 MED ORDER — NITROGLYCERIN IN D5W 200-5 MCG/ML-% IV SOLN
0.0000 ug/min | INTRAVENOUS | Status: DC
  Administered 2021-03-25: 23:00:00 5 ug/min via INTRAVENOUS
  Filled 2021-03-25: qty 250

## 2021-03-25 NOTE — ED Provider Notes (Signed)
[facility] EMERGENCY DEPARTMENT Provider Note   CSN: [id number] Arrival date & time: 03/25/21  2141     History Chief Complaint  Patient presents with  . Respiratory Distress    Brett [name] is a 75 y.o. male.  The history is provided by the patient, the EMS personnel, medical records and a significant other.  Shortness of Breath Severity:  Severe Onset quality:  Gradual Duration:  4 hours Timing:  Constant Progression:  Worsening Chronicity:  New Context: activity   Context: not animal exposure, not emotional upset, not fumes, not known allergens, not occupational exposure, not pollens, not smoke exposure, not strong odors, not URI and not weather changes   Relieved by: CPAP by EMS. Worsened by:  Activity Ineffective treatments:  None tried Associated symptoms: no abdominal pain, no chest pain, no claudication, no cough, no diaphoresis, no ear pain, no fever, no headaches, no hemoptysis, no neck pain, no PND, no rash, no sore throat, no sputum production, no syncope, no swollen glands, no vomiting and no wheezing   Risk factors: obesity   Risk factors comment:  H/o HTN, poorly controlled, HFpEF  Worsening SHOB since about noon today. Did not have chest pain. Got much worse around 1800 EMS reported his systolic BP was 160V on their arrival. He was hypoxic into the low 80s with significant increased WOB. Started him on CPAP. Gave 2 SL nitro.     Past Medical History:  Diagnosis Date  . Acid reflux   . Allergy   . Colon polyp   . Diabetes mellitus without complication ([place])   . Dilated cardiomyopathy ([place])    non isch  . Gout   . Gout   . Hemochromatosis   . Hypertension   . OSA (obstructive sleep apnea)   . Urine frequency     Patient Active Problem List   Diagnosis Date Noted  . Acute respiratory failure with hypoxia ([place]) 03/26/2021  . Diabetes mellitus due to underlying condition with stage 2 chronic kidney disease, with long-term  current use of insulin ([place]) 07/05/2020  . Gastroesophageal reflux disease 11/23/2018  . Former smoker 08/15/2018  . FHx: heart disease 08/15/2018  . Diabetic retinopathy ([place]) 08/16/2017  . Type 2 diabetes mellitus with stage 2 chronic kidney disease, with long-term current use of insulin ([place]) 08/08/2017  . Gluttony 05/06/2017  . Obesity (BMI 30.0-34.9) 09/17/2015  . Hyperlipidemia associated with type 2 diabetes mellitus ([place]) 01/21/2014  . Vitamin D deficiency 01/21/2014  . Medication management 01/21/2014  . CKD stage 2 due to type 2 diabetes mellitus ([place])   . Hypertension   . OSA (obstructive sleep apnea)   . Gout   . Dilated cardiomyopathy ([place])   . Hemochromatosis 09/27/2011    Past Surgical History:  Procedure Laterality Date  . EYE SURGERY Bilateral    Cataract       Family History  Problem Relation Age of Onset  . Diabetes Father   . Diabetes Other   . Hypertension Other   . Hyperlipidemia Other   . Heart disease Other   . Cancer Maternal Uncle        brain  . Heart attack Brother        29  . Diabetes Brother     Social History   Tobacco Use  . Smoking status: Former Smoker    Packs/day: 3.00    Years: 19.00    Pack years: 57.00    Types: Cigarettes    Start  date: 10/24/1957    Quit date: 11/24/1976    Years since quitting: 44.3  . Smokeless tobacco: Never Used  Substance Use Topics  . Alcohol use: Yes    Comment: Occasionally  . Drug use: No    Home Medications Prior to Admission medications   Medication Sig Start Date End Date Taking? Authorizing Provider  allopurinol (ZYLOPRIM) 300 MG tablet TAKE 1 TABLET BY MOUTH  DAILY TO PREVENT GOUT 08/17/20   [name], NP  amLODipine (NORVASC) 10 MG tablet Take      1 tablet      at Night        for BP 08/17/20   [name], MD  aspirin 81 MG tablet Take 81 mg by mouth daily.    [provider]  atorvastatin (LIPITOR) 20 MG tablet TAKE 1 TABLET BY MOUTH  DAILY FOR CHOLESTEROL  08/17/20   [name], NP  AZO-CRANBERRY PO Take by mouth.    [provider]  bisoprolol-hydrochlorothiazide [facility]) 10-6.25 MG tablet TAKE 1 TABLET BY MOUTH  DAILY FOR BLOOD PRESSURE 03/18/20   [name], NP  Blood Glucose Monitoring Suppl DEVI Check blood sugar 3 times a day-DX-E11.22 07/07/20   [name], MD  cetirizine (ZYRTEC) 10 MG tablet Take 10 mg by mouth daily.    [provider]  Cholecalciferol (VITAMIN D PO) Take 5,000 Units by mouth daily.    [provider]  D-Mannose 500 MG CAPS Take 1,500 mg by mouth.    [provider]  diphenhydramine-acetaminophen (TYLENOL PM) 25-500 MG TABS tablet Take 1 tablet by mouth at bedtime as needed.    [provider]  famotidine (PEPCID) 40 MG tablet Take 40 mg by mouth daily.    [provider]  furosemide (LASIX) 40 MG tablet TAKE 1 TABLET BY MOUTH  DAILY FOR BP, / FLUID  RETENTION / ANKLE SWELLING 08/17/20   [name], NP  glipiZIDE (GLUCOTROL) 10 MG tablet TAKE 1 TABLET BY MOUTH 3  TIMES DAILY WITH MEALS FOR  DIABETES 07/25/20   [name], NP  glucose blood (IGLUCOSE TEST STRIPS) test strip Check blood sugar 3 times daily-DX-E11.22 07/07/20   [name], MD  insulin isophane & regular human (HUMULIN 70/30 MIX) (70-30) 100 UNIT/ML KwikPen Inject into subcutaneous skin 13min before meals, 45units breakfast, 40units lunch and 22units dinner. Patient taking differently: Inject into subcutaneous skin 38min before meals, 45units breakfast, 25 units lunch and 20units dinner. 12/31/19   Garnet Sierras, NP  Insulin Pen Needle 31G X 8 MM MISC Use one three times a day and as needed [place] 12/20/19   Garnet Sierras, NP  Lancets Ultra Fine MISC Check blood sugar 3 times a day-E11.22 07/07/20   [name], MD  Loratadine (CLARITIN PO) Take by mouth daily.    [provider]  magnesium oxide (MAG-OX) 400 MG tablet Take 400 mg by mouth 2 (two) times daily.      [provider]  Melatonin 10 MG CAPS Take by mouth.    [provider]  metFORMIN (GLUCOPHAGE-XR) 500 MG 24 hr tablet Take    2 tablets    2 x /day    With Meals for Diabetes 07/05/20   [name], MD  minoxidil (LONITEN) 10 MG tablet Take 1 tablet Daily for BP 05/11/20   [name], MD  montelukast (SINGULAIR) 10 MG tablet TAKE 1 TABLET BY MOUTH  DAILY FOR ALLERGIES Patient taking differently: TAKE 1 TABLET BY MOUTH  DAILY FOR ALLERGIES  08/17/20   [name], NP  olmesartan (BENICAR) 40 MG tablet TAKE 1 TABLET BY MOUTH  DAILY FOR BLOOD PRESSURE 08/17/20   [name], NP  tadalafil (CIALIS) 20 MG tablet Take 1/2 to 1  tablet every 2 to 3 days if needed for XXXX 07/06/20   [name], MD  tamsulosin (FLOMAX) 0.4 MG CAPS capsule TAKE 1 CAPSULE BY MOUTH AT  BEDTIME FOR PROSTATE 01/01/21   [name], NP    Allergies    Patient has no known allergies.  Review of Systems   Review of Systems  Constitutional: Negative for chills, diaphoresis and fever.  HENT: Negative for ear pain and sore throat.   Eyes: Negative for pain and visual disturbance.  Respiratory: Positive for shortness of breath. Negative for cough, hemoptysis, sputum production and wheezing.   Cardiovascular: Positive for leg swelling. Negative for chest pain, palpitations, claudication, syncope and PND.  Gastrointestinal: Negative for abdominal pain and vomiting.  Genitourinary: Negative for dysuria and hematuria.  Musculoskeletal: Negative for arthralgias, back pain and neck pain.  Skin: Negative for color change and rash.  Neurological: Negative for seizures, syncope and headaches.  All other systems reviewed and are negative.   Physical Exam Updated Vital Signs BP (!) 128/106   Pulse 72   Temp 97.6 F (36.4 C) (Axillary)   Resp 17   SpO2 96%   Physical Exam Vitals and nursing note reviewed.  Constitutional:      Appearance: He is obese. He is ill-appearing and  toxic-appearing.     Comments:  On CPAP  HENT:     Head: Normocephalic and atraumatic.  Eyes:     Conjunctiva/sclera: Conjunctivae normal.  Neck:     Vascular: JVD present.     Trachea: Trachea and phonation normal.  Cardiovascular:     Rate and Rhythm: Normal rate and regular rhythm.     Pulses:          Radial pulses are 2+ on the right side and 2+ on the left side.     Heart sounds: No murmur heard.   Pulmonary:     Effort: Tachypnea, accessory muscle usage, respiratory distress and retractions present.     Breath sounds: Decreased air movement present. Examination of the right-middle field reveals rales. Examination of the left-middle field reveals rales. Examination of the right-lower field reveals rales. Examination of the left-lower field reveals rales. Rales present.  Abdominal:     General: Abdomen is protuberant.     Palpations: Abdomen is soft.     Tenderness: There is no abdominal tenderness.  Musculoskeletal:     Cervical back: Full passive range of motion without pain, normal range of motion and neck supple.     Right lower leg: 2+ Pitting Edema present.     Left lower leg: 2+ Pitting Edema present.  Skin:    General: Skin is warm and dry.  Neurological:     General: No focal deficit present.     Mental Status: He is alert and oriented to person, place, and time.     GCS: GCS eye subscore is 4. GCS verbal subscore is 5. GCS motor subscore is 6.     ED Results / Procedures / Treatments   Labs (all labs ordered are listed, but only abnormal results are displayed) Labs Reviewed  COMPREHENSIVE METABOLIC PANEL - Abnormal; Notable for the following components:      Result Value   Glucose, Bld 280 (*)    Total Protein 6.4 (*)  Alkaline Phosphatase 33 (*)    All other components within normal limits  CBC WITH DIFFERENTIAL/PLATELET - Abnormal; Notable for the following components:   RBC 4.19 (*)    HCT 38.4 (*)    Neutro Abs 8.0 (*)    Abs Immature  Granulocytes 0.09 (*)    All other components within normal limits  BRAIN NATRIURETIC PEPTIDE - Abnormal; Notable for the following components:   B Natriuretic Peptide 124.6 (*)    All other components within normal limits  I-STAT ARTERIAL BLOOD GAS, ED - Abnormal; Notable for the following components:   pH, Arterial 7.323 (*)    pCO2 arterial 49.7 (*)    pO2, Arterial 228 (*)    HCT 37.0 (*)    Hemoglobin 12.6 (*)    All other components within normal limits  SARS CORONAVIRUS 2 (TAT 6-24 HRS)  RESP PANEL BY RT-PCR (FLU A&B, COVID) [id number]    EKG None  Radiology DG Chest Port 1 View  Result Date: 03/25/2021 CLINICAL DATA:  Hypoxia and difficulty breathing for 2 days. EXAM: PORTABLE CHEST 1 VIEW COMPARISON:  Chest radiograph 09/11/2014 FINDINGS: The heart is enlarged. Mild to moderate pulmonary edema. Trace fluid in the right minor fissure. No large subpulmonic effusion. Bibasilar atelectasis without confluent airspace disease. No pneumothorax. No acute osseous abnormalities are seen. IMPRESSION: CHF.  Cardiomegaly with pulmonary edema and fluid in the fissures. Electronically Signed   By: Keith Rake M.D.   On: 03/25/2021 22:18    Procedures Procedures   Medications Ordered in ED Medications  nitroGLYCERIN 50 mg in dextrose 5 % 250 mL (0.2 mg/mL) infusion (15 mcg/min Intravenous Rate/Dose Change 03/25/21 2355)  furosemide (LASIX) injection 60 mg (60 mg Intravenous Given 03/25/21 2254)    ED Course  I have reviewed the triage vital signs and the nursing notes.  Pertinent labs & imaging results that were available during my care of the patient were reviewed by me and considered in my medical decision making (see chart for details).  Clinical Course as of 03/26/21 0017  Thu Mar 25, 2021  2300 Patient was reassessed  - now off BiPAP. Work of breathing significantly improved. Now on 2L [place]. Nitro gtt going, now normotensive. Will need admission for echo and further diuresis.   [ZB]    Clinical Course User Index [ZB] Pearson Grippe, DO   MDM Rules/Calculators/A&P                          Impression is flash pulmonary edema. Also considering CHF exacerbation. Could be PNA however less likely based on symptoms and course. Wasn't febrile here.  He was hypertensive with significant increased work of breathing. There was clear evidence on exam of hypervolemia. Started on BiPAP.  Started on nitroglycerin infusion. Did consider ACS.  EKG showed no STEMI.  No evidence of acute ischemia.  CXR with cardiomegaly and obvious pulmonary edema. Ordered for a dose of IV lasix. Doubt PNA. He had significant improvement on BiPAP and was able to be taken off. Still requiring 2L [place] for hypoxia.  Admitted for further treatment and workup.   Final Clinical Impression(s) / ED Diagnoses Final diagnoses:  Acute respiratory failure with hypoxia ([place])  Acute pulmonary edema (H[facility])  Acute congestive heart failure, unspecified heart failure type ([place])  Flash pulmonary edema [facility])    Rx / DC Orders ED Discharge Orders    None       Pearson Grippe, DO 03/26/21 0018  Davonna Belling, MD 03/26/21 [phone]

## 2021-03-25 NOTE — ED Notes (Signed)
Respiratory in room for BiPAP

## 2021-03-25 NOTE — ED Triage Notes (Signed)
Pt BIB EMS from home for increased difficulty breathing for 2 days. 60s% RA with fire dept, 95% on CPAP, 0.8 nitro given by EMS  EMS vitals: BP initially 210/100, then 178/100 RR 30 CBG 207  18G IV in L AC

## 2021-03-25 NOTE — Progress Notes (Signed)
Patient stated he was breathing better. Removed patient from bipap and placed him on nasal cannula set at 4lpm. Will continue to monitor patient.

## 2021-03-26 ENCOUNTER — Encounter (HOSPITAL_COMMUNITY): Payer: Self-pay | Admitting: Family Medicine

## 2021-03-26 ENCOUNTER — Inpatient Hospital Stay (HOSPITAL_COMMUNITY): Payer: Medicare Other

## 2021-03-26 ENCOUNTER — Other Ambulatory Visit: Payer: Self-pay

## 2021-03-26 DIAGNOSIS — N400 Enlarged prostate without lower urinary tract symptoms: Secondary | ICD-10-CM | POA: Diagnosis present

## 2021-03-26 DIAGNOSIS — I509 Heart failure, unspecified: Secondary | ICD-10-CM | POA: Diagnosis not present

## 2021-03-26 DIAGNOSIS — R9431 Abnormal electrocardiogram [ECG] [EKG]: Secondary | ICD-10-CM

## 2021-03-26 DIAGNOSIS — I429 Cardiomyopathy, unspecified: Secondary | ICD-10-CM | POA: Diagnosis not present

## 2021-03-26 DIAGNOSIS — F05 Delirium due to known physiological condition: Secondary | ICD-10-CM | POA: Diagnosis not present

## 2021-03-26 DIAGNOSIS — R0609 Other forms of dyspnea: Secondary | ICD-10-CM

## 2021-03-26 DIAGNOSIS — Z794 Long term (current) use of insulin: Secondary | ICD-10-CM | POA: Diagnosis not present

## 2021-03-26 DIAGNOSIS — N182 Chronic kidney disease, stage 2 (mild): Secondary | ICD-10-CM

## 2021-03-26 DIAGNOSIS — E1169 Type 2 diabetes mellitus with other specified complication: Secondary | ICD-10-CM | POA: Diagnosis not present

## 2021-03-26 DIAGNOSIS — I2510 Atherosclerotic heart disease of native coronary artery without angina pectoris: Secondary | ICD-10-CM | POA: Diagnosis present

## 2021-03-26 DIAGNOSIS — Z6839 Body mass index (BMI) 39.0-39.9, adult: Secondary | ICD-10-CM | POA: Diagnosis not present

## 2021-03-26 DIAGNOSIS — I517 Cardiomegaly: Secondary | ICD-10-CM | POA: Diagnosis not present

## 2021-03-26 DIAGNOSIS — E785 Hyperlipidemia, unspecified: Secondary | ICD-10-CM | POA: Diagnosis present

## 2021-03-26 DIAGNOSIS — Z833 Family history of diabetes mellitus: Secondary | ICD-10-CM | POA: Diagnosis not present

## 2021-03-26 DIAGNOSIS — I130 Hypertensive heart and chronic kidney disease with heart failure and stage 1 through stage 4 chronic kidney disease, or unspecified chronic kidney disease: Secondary | ICD-10-CM | POA: Diagnosis present

## 2021-03-26 DIAGNOSIS — E669 Obesity, unspecified: Secondary | ICD-10-CM | POA: Diagnosis present

## 2021-03-26 DIAGNOSIS — I5021 Acute systolic (congestive) heart failure: Secondary | ICD-10-CM | POA: Diagnosis not present

## 2021-03-26 DIAGNOSIS — I420 Dilated cardiomyopathy: Secondary | ICD-10-CM | POA: Diagnosis present

## 2021-03-26 DIAGNOSIS — J811 Chronic pulmonary edema: Secondary | ICD-10-CM | POA: Diagnosis not present

## 2021-03-26 DIAGNOSIS — Z20822 Contact with and (suspected) exposure to covid-19: Secondary | ICD-10-CM | POA: Diagnosis present

## 2021-03-26 DIAGNOSIS — I10 Essential (primary) hypertension: Secondary | ICD-10-CM | POA: Diagnosis not present

## 2021-03-26 DIAGNOSIS — M109 Gout, unspecified: Secondary | ICD-10-CM | POA: Diagnosis present

## 2021-03-26 DIAGNOSIS — E876 Hypokalemia: Secondary | ICD-10-CM | POA: Diagnosis not present

## 2021-03-26 DIAGNOSIS — J810 Acute pulmonary edema: Secondary | ICD-10-CM | POA: Diagnosis not present

## 2021-03-26 DIAGNOSIS — J9601 Acute respiratory failure with hypoxia: Secondary | ICD-10-CM | POA: Diagnosis present

## 2021-03-26 DIAGNOSIS — R778 Other specified abnormalities of plasma proteins: Secondary | ICD-10-CM | POA: Diagnosis not present

## 2021-03-26 DIAGNOSIS — I5043 Acute on chronic combined systolic (congestive) and diastolic (congestive) heart failure: Secondary | ICD-10-CM | POA: Diagnosis not present

## 2021-03-26 DIAGNOSIS — Z8249 Family history of ischemic heart disease and other diseases of the circulatory system: Secondary | ICD-10-CM | POA: Diagnosis not present

## 2021-03-26 DIAGNOSIS — E1122 Type 2 diabetes mellitus with diabetic chronic kidney disease: Secondary | ICD-10-CM

## 2021-03-26 DIAGNOSIS — I5020 Unspecified systolic (congestive) heart failure: Secondary | ICD-10-CM | POA: Diagnosis not present

## 2021-03-26 DIAGNOSIS — E1165 Type 2 diabetes mellitus with hyperglycemia: Secondary | ICD-10-CM | POA: Diagnosis present

## 2021-03-26 DIAGNOSIS — I447 Left bundle-branch block, unspecified: Secondary | ICD-10-CM | POA: Diagnosis present

## 2021-03-26 DIAGNOSIS — G4733 Obstructive sleep apnea (adult) (pediatric): Secondary | ICD-10-CM | POA: Diagnosis present

## 2021-03-26 DIAGNOSIS — R0602 Shortness of breath: Secondary | ICD-10-CM | POA: Diagnosis not present

## 2021-03-26 DIAGNOSIS — K219 Gastro-esophageal reflux disease without esophagitis: Secondary | ICD-10-CM | POA: Diagnosis present

## 2021-03-26 DIAGNOSIS — I4581 Long QT syndrome: Secondary | ICD-10-CM | POA: Diagnosis present

## 2021-03-26 LAB — GLUCOSE, CAPILLARY
Glucose-Capillary: 234 mg/dL — ABNORMAL HIGH (ref 70–99)
Glucose-Capillary: 175 mg/dL — ABNORMAL HIGH (ref 70–99)
Glucose-Capillary: 222 mg/dL — ABNORMAL HIGH (ref 70–99)
Glucose-Capillary: 253 mg/dL — ABNORMAL HIGH (ref 70–99)
Glucose-Capillary: 164 mg/dL — ABNORMAL HIGH (ref 70–99)

## 2021-03-26 LAB — ECHOCARDIOGRAM COMPLETE
Area-P 1/2: 3.99 cm2
Calc EF: 35 %
S' Lateral: 4.7 cm
Single Plane A2C EF: 38 %
Single Plane A4C EF: 29.8 %
Weight: 3798.97 oz

## 2021-03-26 LAB — LIPID PANEL
Cholesterol: 111 mg/dL (ref 0–200)
HDL: 39 mg/dL — ABNORMAL LOW (ref >40)
LDL Cholesterol: 64 mg/dL (ref 0–99)
Total CHOL/HDL Ratio: 2.8 RATIO
Triglycerides: 42 mg/dL (ref <150)
VLDL: 8 mg/dL (ref 0–40)

## 2021-03-26 LAB — CBC
HCT: 36.9 % — ABNORMAL LOW (ref 39.0–52.0)
Hemoglobin: 13.2 g/dL (ref 13.0–17.0)
MCH: 32.1 pg (ref 26.0–34.0)
MCHC: 35.8 g/dL (ref 30.0–36.0)
MCV: 89.8 fL (ref 80.0–100.0)
Platelets: 184 10*3/uL (ref 150–400)
RBC: 4.11 MIL/uL — ABNORMAL LOW (ref 4.22–5.81)
RDW: 13.6 % (ref 11.5–15.5)
WBC: 9.1 10*3/uL (ref 4.0–10.5)
nRBC: 0 % (ref 0.0–0.2)

## 2021-03-26 LAB — BASIC METABOLIC PANEL
Anion gap: 9 (ref 5–15)
BUN: 18 mg/dL (ref 8–23)
CO2: 27 mmol/L (ref 22–32)
Calcium: 8.8 mg/dL — ABNORMAL LOW (ref 8.9–10.3)
Chloride: 103 mmol/L (ref 98–111)
Creatinine, Ser: 1.16 mg/dL (ref 0.61–1.24)
GFR, Estimated: >60 mL/min (ref >60)
Glucose, Bld: 180 mg/dL — ABNORMAL HIGH (ref 70–99)
Potassium: 3.4 mmol/L — ABNORMAL LOW (ref 3.5–5.1)
Sodium: 139 mmol/L (ref 135–145)

## 2021-03-26 LAB — SARS CORONAVIRUS 2 (TAT 6-24 HRS): SARS Coronavirus 2: NEGATIVE

## 2021-03-26 LAB — HEMOGLOBIN A1C
Hgb A1c MFr Bld: 7.6 % — ABNORMAL HIGH (ref 4.8–5.6)
Mean Plasma Glucose: 171 mg/dL

## 2021-03-26 LAB — TROPONIN I (HIGH SENSITIVITY)
Troponin I (High Sensitivity): 44 ng/L — ABNORMAL HIGH (ref <18)
Troponin I (High Sensitivity): 46 ng/L — ABNORMAL HIGH (ref <18)
Troponin I (High Sensitivity): 46 ng/L — ABNORMAL HIGH (ref <18)

## 2021-03-26 LAB — BRAIN NATRIURETIC PEPTIDE: B Natriuretic Peptide: 193.5 pg/mL — ABNORMAL HIGH (ref 0.0–100.0)

## 2021-03-26 LAB — TSH: TSH: 3.764 u[IU]/mL (ref 0.350–4.500)

## 2021-03-26 MED ORDER — INSULIN ASPART PROT & ASPART (70-30 MIX) 100 UNIT/ML ~~LOC~~ SUSP
15.0000 [IU] | Freq: Three times a day (TID) | SUBCUTANEOUS | Status: DC
  Administered 2021-03-26 – 2021-03-31 (×16): 15 [IU] via SUBCUTANEOUS
  Filled 2021-03-26 (×4): qty 10

## 2021-03-26 MED ORDER — FAMOTIDINE 20 MG PO TABS
40.0000 mg | ORAL_TABLET | Freq: Every day | ORAL | Status: DC
  Administered 2021-03-26 – 2021-03-31 (×6): 40 mg via ORAL
  Filled 2021-03-26 (×6): qty 2

## 2021-03-26 MED ORDER — INSULIN ASPART 100 UNIT/ML IJ SOLN
0.0000 [IU] | Freq: Every day | INTRAMUSCULAR | Status: DC
  Administered 2021-03-26: 2 [IU] via SUBCUTANEOUS

## 2021-03-26 MED ORDER — BISOPROLOL FUMARATE 5 MG PO TABS
10.0000 mg | ORAL_TABLET | Freq: Every day | ORAL | Status: DC
  Administered 2021-03-26 – 2021-03-31 (×6): 10 mg via ORAL
  Filled 2021-03-26: qty 1
  Filled 2021-03-26 (×5): qty 2

## 2021-03-26 MED ORDER — SODIUM CHLORIDE 0.9% FLUSH
3.0000 mL | Freq: Two times a day (BID) | INTRAVENOUS | Status: DC
  Administered 2021-03-26 – 2021-03-28 (×4): 3 mL via INTRAVENOUS

## 2021-03-26 MED ORDER — DAPAGLIFLOZIN PROPANEDIOL 10 MG PO TABS
10.0000 mg | ORAL_TABLET | Freq: Every day | ORAL | Status: DC
  Administered 2021-03-27 – 2021-03-31 (×5): 10 mg via ORAL
  Filled 2021-03-26 (×6): qty 1

## 2021-03-26 MED ORDER — ASPIRIN EC 81 MG PO TBEC
81.0000 mg | DELAYED_RELEASE_TABLET | Freq: Every day | ORAL | Status: DC
  Administered 2021-03-26 – 2021-03-28 (×3): 81 mg via ORAL
  Filled 2021-03-26 (×3): qty 1

## 2021-03-26 MED ORDER — TAMSULOSIN HCL 0.4 MG PO CAPS
0.4000 mg | ORAL_CAPSULE | Freq: Every day | ORAL | Status: DC
Start: 2021-03-26 — End: 2021-03-31
  Administered 2021-03-26 – 2021-03-30 (×5): 0.4 mg via ORAL
  Filled 2021-03-26 (×5): qty 1

## 2021-03-26 MED ORDER — SACUBITRIL-VALSARTAN 49-51 MG PO TABS
1.0000 | ORAL_TABLET | Freq: Two times a day (BID) | ORAL | Status: DC
  Administered 2021-03-27 – 2021-03-28 (×3): 1 via ORAL
  Filled 2021-03-26 (×3): qty 1

## 2021-03-26 MED ORDER — INSULIN ASPART 100 UNIT/ML IJ SOLN: 0.0000 [IU] | Freq: Every day | INTRAMUSCULAR | Status: DC

## 2021-03-26 MED ORDER — SPIRONOLACTONE 12.5 MG HALF TABLET
12.5000 mg | ORAL_TABLET | Freq: Every day | ORAL | Status: DC
  Administered 2021-03-27: 12.5 mg via ORAL
  Filled 2021-03-26: qty 1

## 2021-03-26 MED ORDER — MAGNESIUM OXIDE -MG SUPPLEMENT 400 (240 MG) MG PO TABS
400.0000 mg | ORAL_TABLET | Freq: Every day | ORAL | Status: DC
  Administered 2021-03-26 – 2021-03-31 (×6): 400 mg via ORAL
  Filled 2021-03-26 (×6): qty 1

## 2021-03-26 MED ORDER — SODIUM CHLORIDE 0.9% FLUSH: 3.0000 mL | INTRAVENOUS | Status: DC | PRN

## 2021-03-26 MED ORDER — SODIUM CHLORIDE 0.9 % IV SOLN: 250.0000 mL | INTRAVENOUS | Status: DC | PRN

## 2021-03-26 MED ORDER — FUROSEMIDE 10 MG/ML IJ SOLN
40.0000 mg | Freq: Every day | INTRAMUSCULAR | Status: DC
  Administered 2021-03-27: 40 mg via INTRAVENOUS
  Filled 2021-03-26: qty 4

## 2021-03-26 MED ORDER — INSULIN ASPART 100 UNIT/ML IJ SOLN
0.0000 [IU] | Freq: Three times a day (TID) | INTRAMUSCULAR | Status: DC
  Administered 2021-03-26: 8 [IU] via SUBCUTANEOUS
  Administered 2021-03-26: 3 [IU] via SUBCUTANEOUS
  Administered 2021-03-26: 5 [IU] via SUBCUTANEOUS
  Administered 2021-03-27: 11 [IU] via SUBCUTANEOUS
  Administered 2021-03-27 (×2): 3 [IU] via SUBCUTANEOUS
  Administered 2021-03-28: 5 [IU] via SUBCUTANEOUS
  Administered 2021-03-28 – 2021-03-29 (×2): 2 [IU] via SUBCUTANEOUS
  Administered 2021-03-30: 5 [IU] via SUBCUTANEOUS
  Administered 2021-03-30: 8 [IU] via SUBCUTANEOUS
  Administered 2021-03-30: 3 [IU] via SUBCUTANEOUS
  Administered 2021-03-31: 2 [IU] via SUBCUTANEOUS
  Administered 2021-03-31: 5 [IU] via SUBCUTANEOUS

## 2021-03-26 MED ORDER — ACETAMINOPHEN 325 MG PO TABS: 650.0000 mg | ORAL_TABLET | ORAL | Status: DC | PRN

## 2021-03-26 MED ORDER — IRBESARTAN 300 MG PO TABS
300.0000 mg | ORAL_TABLET | Freq: Every day | ORAL | Status: DC
  Administered 2021-03-26: 300 mg via ORAL
  Filled 2021-03-26: qty 1

## 2021-03-26 MED ORDER — MINOXIDIL 10 MG PO TABS
10.0000 mg | ORAL_TABLET | Freq: Every day | ORAL | Status: DC
  Administered 2021-03-26: 10 mg via ORAL
  Filled 2021-03-26: qty 1

## 2021-03-26 MED ORDER — ALLOPURINOL 300 MG PO TABS
300.0000 mg | ORAL_TABLET | Freq: Every day | ORAL | Status: DC
  Administered 2021-03-26 – 2021-03-30 (×5): 300 mg via ORAL
  Filled 2021-03-26 (×6): qty 1

## 2021-03-26 MED ORDER — AMLODIPINE BESYLATE 10 MG PO TABS
10.0000 mg | ORAL_TABLET | Freq: Every day | ORAL | Status: DC
  Administered 2021-03-26 – 2021-03-29 (×4): 10 mg via ORAL
  Filled 2021-03-26 (×4): qty 1

## 2021-03-26 MED ORDER — ENOXAPARIN SODIUM 40 MG/0.4ML IJ SOSY
40.0000 mg | PREFILLED_SYRINGE | Freq: Every day | INTRAMUSCULAR | Status: DC
  Administered 2021-03-26 – 2021-03-31 (×6): 40 mg via SUBCUTANEOUS
  Filled 2021-03-26 (×6): qty 0.4

## 2021-03-26 MED ORDER — ATORVASTATIN CALCIUM 10 MG PO TABS
20.0000 mg | ORAL_TABLET | Freq: Every day | ORAL | Status: DC
  Administered 2021-03-26 – 2021-03-31 (×6): 20 mg via ORAL
  Filled 2021-03-26 (×6): qty 2

## 2021-03-26 MED ORDER — BISOPROLOL-HYDROCHLOROTHIAZIDE 10-6.25 MG PO TABS
1.0000 | ORAL_TABLET | Freq: Every day | ORAL | Status: DC
  Filled 2021-03-26: qty 1

## 2021-03-26 MED ORDER — FUROSEMIDE 10 MG/ML IJ SOLN
60.0000 mg | Freq: Two times a day (BID) | INTRAMUSCULAR | Status: DC
  Administered 2021-03-26 (×2): 60 mg via INTRAVENOUS
  Filled 2021-03-26 (×2): qty 6

## 2021-03-26 MED ORDER — METFORMIN HCL ER 500 MG PO TB24: 1000.0000 mg | ORAL_TABLET | Freq: Two times a day (BID) | ORAL | Status: DC

## 2021-03-26 NOTE — Progress Notes (Signed)
  Echocardiogram 2D Echocardiogram has been performed.  Brett [name] 03/26/2021, 3:31 PM

## 2021-03-26 NOTE — Progress Notes (Signed)
Per MD, titrate nitro drip down to be able to remove it. Only titrate up if BP  Is 180 SBP or greater.

## 2021-03-26 NOTE — ED Notes (Signed)
RN attempted to call report x1 

## 2021-03-26 NOTE — Progress Notes (Signed)
PROGRESS NOTE        PATIENT DETAILS Name: Brett [name] Age: 75 y.o. Sex: male Date of Birth: 1946/07/19 Admit Date: 03/25/2021 Admitting Physician Eben Burow, MD [id number], Ellie Lunch, FNP (Inactive)  Brief Narrative: Patient is a 75 y.o. male with history of hereditary hemochromatosis, HTN, DM-2-who presented to the hospital with several days history of exertional dyspnea, lower extremity edema-on day of admission-his shortness of breath worsened significantly-he was brought to the ED and required BiPAP.  He was placed on IV nitroglycerin drip-given IV Lasix and responded well.  He was subsequently admitted to the hospitalist service for further evaluation and treatment.  Significant events: 6/2>> presented with lower extremity edema/SOB-required BiPAP-admit to [facility]  Significant studies: 6/2>> CXR:+ Pulmonary edema  Antimicrobial therapy: None  Microbiology data: 6/2>> COVID PCR: Negative  Procedures : None  Consults: Cardiology  DVT Prophylaxis : enoxaparin (LOVENOX) injection 40 mg Start: 03/26/21 1000   Subjective: Feels better-on just 2 L of oxygen this morning.  Still with significant lower extremity edema.   Assessment/Plan: Acute hypoxic respiratory failure: Due to pulmonary edema-initially on BiPAP and the emergency room-responding well to diuretics and other therapies-Down to 2 L of oxygen.  Decompensated heart failure (systolic versus diastolic): Still significantly volume overloaded-awaiting echo to determine whether systolic/diastolic.  Continue IV diuretics-attempt to titrate off nitroglycerin infusion.  Already on bisoprolol and Avapro-which is being continued for now.  May need to adjust therapies depending on echo findings.  HTN: BP reasonably well controlled this morning-required nitroglycerin infusion when he first presented to the ED-on multiple oral antihypertensives-amlodipine/bisoprolol/Avapro/minoxidil-all being  continued for now-RN instructed to titrate off nitroglycerin infusion.  HLD: Continue statin  DM-2 (A1c 7.4 on 3/21): CBGs reasonable this morning-continue insulin 70/30 15 units 3 times daily with meals-remains on SSI-hold all oral hypoglycemics for now.  Follow CBGs for another 24 hours before adjusting any further.  Recent Labs    03/26/21 0224 03/26/21 0741  GLUCAP 234* 175*   BPH: Continue Flomax  History of hereditary hemochromatosis: Followed by Dr. Louanne Skye required therapeutic phlebotomy in the past.  Morbid Obesity: Estimated body mass index is 39.51 kg/m as calculated from the following:   Height as of 01/11/21: 5\' 5"  (1.651 m).   Weight as of this encounter: 107.7 kg.    Diet: Diet Order            Diet heart healthy/carb modified Room service appropriate? Yes; Fluid consistency: Thin  Diet effective now                  Code Status: Full code   Family Communication: Spouse at bedside  Disposition Plan: Status is: Inpatient  Remains inpatient appropriate because:Inpatient level of care appropriate due to severity of illness   Dispo: The patient is from: Home              Anticipated d/c is to: Home              Patient currently is not medically stable to d/c.   Difficult to place patient No   Barriers to Discharge: CHF decompensation/volume overload-on IV diuretics-not yet stable for discharge.  Antimicrobial agents: Anti-infectives (From admission, onward)   None       Time spent: 35 minutes-Greater than 50% of this time was spent in counseling, explanation of diagnosis, planning of further management,  and coordination of care.  MEDICATIONS: Scheduled Meds: . allopurinol  300 mg Oral Daily  . amLODipine  10 mg Oral Daily  . aspirin EC  81 mg Oral Daily  . atorvastatin  20 mg Oral Daily  . bisoprolol  10 mg Oral Daily  . enoxaparin (LOVENOX) injection  40 mg Subcutaneous Daily  . famotidine  40 mg Oral Daily  . furosemide  60 mg  Intravenous Q12H  . insulin aspart  0-15 Units Subcutaneous TID WC  . insulin aspart  0-5 Units Subcutaneous QHS  . insulin aspart protamine- aspart  15 Units Subcutaneous TID WC  . irbesartan  300 mg Oral Daily  . magnesium oxide  400 mg Oral Daily  . minoxidil  10 mg Oral Daily  . sodium chloride flush  3 mL Intravenous Q12H  . tamsulosin  0.4 mg Oral QHS   Continuous Infusions: . sodium chloride    . nitroGLYCERIN Stopped (03/26/21 1324)   PRN Meds:.sodium chloride, acetaminophen, sodium chloride flush   PHYSICAL EXAM: Vital signs: Vitals:   03/26/21 0745 03/26/21 0800 03/26/21 0815 03/26/21 0830  BP: 128/74 118/81 130/74 134/75  Pulse: 70 69 68 69  Resp:    16  Temp:      TempSrc:      SpO2: 93% 95% 95% 95%  Weight:       Filed Weights   03/26/21 0203  Weight: 107.7 kg   Body mass index is 39.51 kg/m.   Gen Exam:Alert awake-not in any distress HEENT:atraumatic, normocephalic Chest: Bibasilar rales CVS:S1S2 regular Abdomen:soft non tender, non distended Extremities: 2+ edema bilaterally Neurology: Non focal Skin: no rash  I have personally reviewed following labs and imaging studies  LABORATORY DATA: CBC: Recent Labs  Lab 03/25/21 2206 03/25/21 2227 03/26/21 0326  WBC 10.4  --  9.1  NEUTROABS 8.0*  --   --   HGB 13.5 12.6* 13.2  HCT 38.4* 37.0* 36.9*  MCV 91.6  --  89.8  PLT 172  --  401    Basic Metabolic Panel: Recent Labs  Lab 03/25/21 2206 03/25/21 2227 03/26/21 0728  NA 137 139 139  K 3.9 3.6 3.4*  CL 103  --  103  CO2 27  --  27  GLUCOSE 280*  --  180*  BUN 20  --  18  CREATININE 1.24  --  1.16  CALCIUM 8.9  --  8.8*    GFR: Estimated Creatinine Clearance: 63.2 mL/min (by C-G formula based on SCr of 1.16 mg/dL).  Liver Function Tests: Recent Labs  Lab 03/25/21 2206  AST 20  ALT 28  ALKPHOS 33*  BILITOT 1.1  PROT 6.4*  ALBUMIN 3.9   No results for input(s): LIPASE, AMYLASE in the last 168 hours. No results for  input(s): AMMONIA in the last 168 hours.  Coagulation Profile: No results for input(s): INR, PROTIME in the last 168 hours.  Cardiac Enzymes: No results for input(s): CKTOTAL, CKMB, CKMBINDEX, TROPONINI in the last 168 hours.  BNP (last 3 results) No results for input(s): PROBNP in the last 8760 hours.  Lipid Profile: Recent Labs    03/26/21 0326  CHOL 111  HDL 39*  LDLCALC 64  TRIG 42  CHOLHDL 2.8    Thyroid Function Tests: No results for input(s): TSH, T4TOTAL, FREET4, T3FREE, THYROIDAB in the last 72 hours.  Anemia Panel: No results for input(s): VITAMINB12, FOLATE, FERRITIN, TIBC, IRON, RETICCTPCT in the last 72 hours.  Urine analysis:    Component Value  Date/Time   COLORURINE YELLOW 04/02/2020 1021   APPEARANCEUR CLEAR 04/02/2020 1021   LABSPEC 1.014 04/02/2020 1021   PHURINE < OR = 5.0 04/02/2020 1021   GLUCOSEU NEGATIVE 04/02/2020 1021   HGBUR NEGATIVE 04/02/2020 1021   BILIRUBINUR neg 01/11/2021 0946   KETONESUR NEGATIVE 04/02/2020 1021   PROTEINUR Negative 01/11/2021 0946   PROTEINUR NEGATIVE 04/02/2020 1021   UROBILINOGEN 0.2 01/11/2021 0946   UROBILINOGEN 0.2 09/11/2014 1017   NITRITE neg 01/11/2021 0946   NITRITE NEGATIVE 03/07/2019 0841   LEUKOCYTESUR Negative 01/11/2021 0946   LEUKOCYTESUR NEGATIVE 03/07/2019 0841    Sepsis Labs: Lactic Acid, Venous No results found for: LATICACIDVEN  MICROBIOLOGY: Recent Results (from the past 240 hour(s))  SARS CORONAVIRUS 2 (TAT 6-24 HRS) Nasopharyngeal Nasopharyngeal Swab     Status: None   Collection Time: 03/25/21  9:48 PM   Specimen: Nasopharyngeal Swab  Result Value Ref Range Status   SARS Coronavirus 2 NEGATIVE NEGATIVE Final    Comment: (NOTE) SARS-CoV-2 target nucleic acids are NOT DETECTED.  The SARS-CoV-2 RNA is generally detectable in upper and lower respiratory specimens during the acute phase of infection. Negative results do not preclude SARS-CoV-2 infection, do not rule  out co-infections with other pathogens, and should not be used as the sole basis for treatment or other patient management decisions. Negative results must be combined with clinical observations, patient history, and epidemiological information. The expected result is Negative.  Fact Sheet for Patients: SugarRoll.be  Fact Sheet for Healthcare Providers: https://www.woods-mathews.com/  This test is not yet approved or cleared by the [place] FDA and  has been authorized for detection and/or diagnosis of SARS-CoV-2 by FDA under an Emergency Use Authorization (EUA). This EUA will remain  in effect (meaning this test can be used) for the duration of the COVID-19 declaration under Se ction 564(b)(1) of the Act, 21 U.S.C. section 360bbb-3(b)(1), unless the authorization is terminated or revoked sooner.  Performed at [facility], [place] [address], [place], [place] [address]     RADIOLOGY STUDIES/RESULTS: DG Chest Port 1 View  Result Date: 03/25/2021 CLINICAL DATA:  Hypoxia and difficulty breathing for 2 days. EXAM: PORTABLE CHEST 1 VIEW COMPARISON:  Chest radiograph 09/11/2014 FINDINGS: The heart is enlarged. Mild to moderate pulmonary edema. Trace fluid in the right minor fissure. No large subpulmonic effusion. Bibasilar atelectasis without confluent airspace disease. No pneumothorax. No acute osseous abnormalities are seen. IMPRESSION: CHF.  Cardiomegaly with pulmonary edema and fluid in the fissures. Electronically Signed   By: Keith Rake M.D.   On: 03/25/2021 22:18     LOS: 0 days   Oren Binet, MD  [facility]    To contact the attending provider between 7A-7P or the covering provider during after hours 7P-7A, please log into the web site [email or web address] and access using universal [place] password for that web site. If you do not have the password, please call the hospital operator.  03/26/2021, 10:15 AM

## 2021-03-26 NOTE — Plan of Care (Signed)
  Problem: Coping: Goal: Level of anxiety will decrease Outcome: Progressing   Problem: Pain Managment: Goal: General experience of comfort will improve Outcome: Progressing   Problem: Clinical Measurements: Goal: Ability to maintain clinical measurements within normal limits will improve Outcome: Not Progressing

## 2021-03-26 NOTE — Consult Note (Signed)
CARDIOLOGY CONSULT NOTE  Patient ID: Brett [name] MRN: [id number] DOB/AGE: 75-Sep-1947 75 y.o.  Admit date: 03/25/2021 Referring Physician  Bonney Aid, MD Primary Physician:  Azzie Glatter, FNP (Inactive) Reason for Consultation  CHF  Patient ID: Brett [name], male    DOB: Jan 10, 1946, 75 y.o.   MRN: [id number]  Chief Complaint  Patient presents with  . Respiratory Distress   HPI:    Brett [name]  is a 75 y.o. Caucasian male patient with hypertension, type 2 diabetes mellitus, OSA not on therapy, >40-pack-year history of smoking quit remotely, presenting with acute onset shortness of breath by the EMS to the emergency room.  Patient in the emergency room was stabilized and diuresed for flash pulmonary edema and placed on BiPAP and eventually weaned off to nasal cannula oxygen.  He is now referred to [place] for evaluation and management of acute decompensated heart failure.  His ex-wife and daughter present at the bedside, patient has been not doing well for the past year or so.  His ex-wife recently moved in with him and having made changes to his diet, he started to walk much more actively than before about 3 to 4 months ago.  He was previously sleeping on a recliner no sleeping in bed flat.  However gradually there has been progression of dyspnea and leg edema.  No chest pain, palpitations, dizziness or syncope.  Past Medical History:  Diagnosis Date  . Acid reflux   . Allergy   . Colon polyp   . Diabetes mellitus without complication ([place])   . Dilated cardiomyopathy ([place])    non isch  . Gout   . Gout   . Hemochromatosis   . Hypertension   . OSA (obstructive sleep apnea)   . Urine frequency    Past Surgical History:  Procedure Laterality Date  . EYE SURGERY Bilateral    Cataract   Social History   Tobacco Use  . Smoking status: Former Smoker    Packs/day: 3.00    Years: 19.00    Pack years: 57.00    Types: Cigarettes    Start date: 10/24/1957    Quit date:  11/24/1976    Years since quitting: 44.3  . Smokeless tobacco: Never Used  Substance Use Topics  . Alcohol use: Yes    Comment: Occasionally    Family History  Problem Relation Age of Onset  . Diabetes Father   . Diabetes Other   . Hypertension Other   . Hyperlipidemia Other   . Heart disease Other   . Cancer Maternal Uncle        brain  . Heart attack Brother        52  . Diabetes Brother     Marital Sttus: Divorced  ROS  Review of Systems  Cardiovascular: Positive for dyspnea on exertion, leg swelling and orthopnea. Negative for chest pain.  Respiratory: Positive for snoring.   Musculoskeletal: Positive for arthritis.  Gastrointestinal: Negative for melena.  All other systems reviewed and are negative.  Objective   Vitals with BMI 03/26/2021 03/26/2021 03/26/2021  Height - - -  Weight - - -  BMI - - -  Systolic 403 474 259  Diastolic 68 67 72  Pulse 68 66 65    Blood pressure 138/68, pulse 68, temperature 98.1 F (36.7 C), temperature source Oral, resp. rate 19, weight 107.7 kg, SpO2 96 %.    Physical Exam Constitutional:      Comments: Morbidly obese in no  acute distress.  Neck:     Vascular: JVD present. No carotid bruit.  Cardiovascular:     Rate and Rhythm: Normal rate and regular rhythm.     Pulses:          Carotid pulses are 2+ on the right side and 2+ on the left side.      Dorsalis pedis pulses are 2+ on the right side and 2+ on the left side.       Posterior tibial pulses are 2+ on the right side and 2+ on the left side.     Heart sounds: Heart sounds are distant. No murmur heard. No gallop.      Comments: Femoral and popliteal pulse difficult to feel due to patient's body habitus.  Pulmonary:     Effort: Pulmonary effort is normal.     Breath sounds: Normal breath sounds.  Abdominal:     General: Bowel sounds are normal.     Palpations: Abdomen is soft.     Comments: Obese. Pannus present  Musculoskeletal:        General: Swelling (1+ bilateral  pitting edema below the knee.) present.    Laboratory examination:   Recent Labs    04/02/20 1021 07/06/20 1429 09/16/20 1127 03/25/21 2206 03/25/21 2227 03/26/21 0728  NA 139 138 138 137 139 139  K 4.2 3.9 4.0 3.9 3.6 3.4*  CL 103 100 100 103  --  103  CO2 28 29 23 27   --  27  GLUCOSE 156* 143* 285* 280*  --  180*  BUN 17 27* 20 20  --  18  CREATININE 1.04 1.15 1.06 1.24  --  1.16  CALCIUM 9.5 9.5 9.7 8.9  --  8.8*  GFRNONAA 71 63 69 >60  --  >60  GFRAA 82 73 80  --   --   --    estimated creatinine clearance is 63.2 mL/min (by C-G formula based on SCr of 1.16 mg/dL).  CMP Latest Ref Rng & Units 03/26/2021 03/25/2021 03/25/2021  Glucose 70 - 99 mg/dL 180(H) - 280(H)  BUN 8 - 23 mg/dL 18 - 20  Creatinine 0.61 - 1.24 mg/dL 1.16 - 1.24  Sodium 135 - 145 mmol/L 139 139 137  Potassium 3.5 - 5.1 mmol/L 3.4(L) 3.6 3.9  Chloride 98 - 111 mmol/L 103 - 103  CO2 22 - 32 mmol/L 27 - 27  Calcium 8.9 - 10.3 mg/dL 8.8(L) - 8.9  Total Protein 6.5 - 8.1 g/dL - - 6.4(L)  Total Bilirubin 0.3 - 1.2 mg/dL - - 1.1  Alkaline Phos 38 - 126 U/L - - 33(L)  AST 15 - 41 U/L - - 20  ALT 0 - 44 U/L - - 28   CBC Latest Ref Rng & Units 03/26/2021 03/25/2021 03/25/2021  WBC 4.0 - 10.5 K/uL 9.1 - 10.4  Hemoglobin 13.0 - 17.0 g/dL 13.2 12.6(L) 13.5  Hematocrit 39.0 - 52.0 % 36.9(L) 37.0(L) 38.4(L)  Platelets 150 - 400 K/uL 184 - 172   Lipid Panel Recent Labs    04/02/20 1021 07/06/20 1429 03/26/21 0326  CHOL 117 115 111  TRIG 73 162* 42  LDLCALC 56 56 64  VLDL  --   --  8  HDL 46 35* 39*  CHOLHDL 2.5 3.3 2.8    HEMOGLOBIN A1C Lab Results  Component Value Date   HGBA1C 7.4 (A) 01/11/2021   HGBA1C 7.4 01/11/2021   HGBA1C 7.4 (A) 01/11/2021   HGBA1C 7.4 (A) 01/11/2021  MPG 203 07/06/2020   TSH Recent Labs    07/06/20 1429 09/16/20 1127 03/26/21 1811  TSH 2.91 4.820* 3.764   BNP (last 3 results) Recent Labs    03/25/21 2206 03/26/21 1830  BNP 124.6* 193.5*   Medications and  allergies  No Known Allergies   Current Meds  Medication Sig  . allopurinol (ZYLOPRIM) 300 MG tablet TAKE 1 TABLET BY MOUTH  DAILY TO PREVENT GOUT (Patient taking differently: Take 300 mg by mouth daily.)  . amLODipine (NORVASC) 10 MG tablet Take      1 tablet      at Night        for BP (Patient taking differently: Take 10 mg by mouth at bedtime.)  . aspirin 81 MG tablet Take 81 mg by mouth daily.  Marland Kitchen atorvastatin (LIPITOR) 20 MG tablet TAKE 1 TABLET BY MOUTH  DAILY FOR CHOLESTEROL (Patient taking differently: Take 20 mg by mouth daily.)  . bisoprolol-hydrochlorothiazide (ZIAC) 10-6.25 MG tablet TAKE 1 TABLET BY MOUTH  DAILY FOR BLOOD PRESSURE (Patient taking differently: Take 1 tablet by mouth daily.)  . Blood Glucose Monitoring Suppl DEVI Check blood sugar 3 times a day-DX-E11.22  . cetirizine (ZYRTEC) 10 MG tablet Take 10 mg by mouth daily.  . Cholecalciferol (VITAMIN D PO) Take 5,000 Units by mouth daily.  . D-Mannose 500 MG CAPS Take 1,500 mg by mouth daily.  . diphenhydramine-acetaminophen (TYLENOL PM) 25-500 MG TABS tablet Take 1 tablet by mouth at bedtime as needed (pain, sleep).  . famotidine (PEPCID) 40 MG tablet Take 40 mg by mouth daily.  . furosemide (LASIX) 40 MG tablet TAKE 1 TABLET BY MOUTH  DAILY FOR BP, / FLUID  RETENTION / ANKLE SWELLING (Patient taking differently: Take 40 mg by mouth daily.)  . glipiZIDE (GLUCOTROL) 10 MG tablet TAKE 1 TABLET BY MOUTH 3  TIMES DAILY WITH MEALS FOR  DIABETES (Patient taking differently: Take 10 mg by mouth daily.)  . glucose blood (IGLUCOSE TEST STRIPS) test strip Check blood sugar 3 times daily-DX-E11.22  . insulin isophane & regular human (HUMULIN 70/30 MIX) (70-30) 100 UNIT/ML KwikPen Inject into subcutaneous skin 40min before meals, 45units breakfast, 40units lunch and 22units dinner. (Patient taking differently: Inject into subcutaneous skin 95min before meals, 45units breakfast, 25 units lunch and 20units dinner.)  . Insulin Pen Needle  31G X 8 MM MISC Use one three times a day and as needed [place]  . Lancets Ultra Fine MISC Check blood sugar 3 times a day-E11.22  . Loratadine (CLARITIN PO) Take 1 tablet by mouth daily.  . magnesium oxide (MAG-OX) 400 MG tablet Take 400 mg by mouth daily.  . Melatonin 10 MG CAPS Take 10 mg by mouth at bedtime as needed (sleep).  . metFORMIN (GLUCOPHAGE-XR) 500 MG 24 hr tablet Take    2 tablets    2 x /day    With Meals for Diabetes (Patient taking differently: Take 1,000 mg by mouth 2 (two) times daily.)  . minoxidil (LONITEN) 10 MG tablet Take 1 tablet Daily for BP (Patient taking differently: Take 10 mg by mouth daily.)  . montelukast (SINGULAIR) 10 MG tablet TAKE 1 TABLET BY MOUTH  DAILY FOR ALLERGIES (Patient taking differently: Take 10 mg by mouth daily.)  . olmesartan (BENICAR) 40 MG tablet TAKE 1 TABLET BY MOUTH  DAILY FOR BLOOD PRESSURE (Patient taking differently: Take 40 mg by mouth daily.)  . tadalafil (CIALIS) 20 MG tablet Take 1/2 to 1  tablet every 2 to 3  days if needed for XXXX (Patient taking differently: Take 10-20 mg by mouth daily as needed.)  . tamsulosin (FLOMAX) 0.4 MG CAPS capsule TAKE 1 CAPSULE BY MOUTH AT  BEDTIME FOR PROSTATE (Patient taking differently: Take 0.4 mg by mouth at bedtime.)  . [DISCONTINUED] AZO-CRANBERRY PO Take by mouth.    Scheduled Meds: . allopurinol  300 mg Oral Daily  . amLODipine  10 mg Oral Daily  . aspirin EC  81 mg Oral Daily  . atorvastatin  20 mg Oral Daily  . bisoprolol  10 mg Oral Daily  . dapagliflozin propanediol  10 mg Oral Daily  . enoxaparin (LOVENOX) injection  40 mg Subcutaneous Daily  . famotidine  40 mg Oral Daily  . [START ON 03/27/2021] furosemide  40 mg Intravenous Daily  . insulin aspart  0-15 Units Subcutaneous TID WC  . insulin aspart  0-5 Units Subcutaneous QHS  . insulin aspart protamine- aspart  15 Units Subcutaneous TID WC  . magnesium oxide  400 mg Oral Daily  . [START ON 03/27/2021] sacubitril-valsartan  1 tablet Oral  BID  . sodium chloride flush  3 mL Intravenous Q12H  . [START ON 03/27/2021] spironolactone  12.5 mg Oral Daily  . tamsulosin  0.4 mg Oral QHS   Continuous Infusions: . sodium chloride    . nitroGLYCERIN Stopped (03/26/21 0925)   PRN Meds:.sodium chloride, acetaminophen, sodium chloride flush   I/O last 3 completed shifts: In: 1321.1 [P.O.:1260; I.V.:61.1] Out: 1950 [Urine:1950] No intake/output data recorded.    Radiology:    [facility] Chest Port 1 View  Result Date: 03/25/2021 CLINICAL DATA:  Hypoxia and difficulty breathing for 2 days. EXAM: PORTABLE CHEST 1 VIEW COMPARISON:  Chest radiograph 09/11/2014 FINDINGS: The heart is enlarged. Mild to moderate pulmonary edema. Trace fluid in the right minor fissure. No large subpulmonic effusion. Bibasilar atelectasis without confluent airspace disease. No pneumothorax. No acute osseous abnormalities are seen. IMPRESSION: CHF.  Cardiomegaly with pulmonary edema and fluid in the fissures. Electronically Signed   By: Keith Rake M.D.   On: 03/25/2021 22:18   Cardiac Studies:   Echocardiogram 03/26/2021:  1. Left ventricular ejection fraction, by estimation, is 30 to 35%. The left ventricle has moderately decreased function. The left ventricle demonstrates regional wall motion abnormalities (see scoring diagram/findings for description). The mid-to-apical inferoseptal, mid-to-apical anteroseptal, mid-to-apical inferior, and apex appear severely hypokinetic. The left ventricular internal cavity size was mildly dilated. There is moderate asymmetric hypertrophy of the basal septum (measures 1.5cm). The rest of the LV segments demonstrate mild left ventricular hypertrophy. Left ventricular diastolic parameters are consistent with Grade II diastolic dysfunction (pseudonormalization). Elevated left atrial pressure. 2. Right ventricular systolic function is normal. The right ventricular size is normal. Tricuspid regurgitation signal is inadequate  for assessing PA pressure. 3. The mitral valve is grossly normal. Trivial mitral valve regurgitation. No evidence of mitral stenosis. 4. The aortic valve is tricuspid. There is mild thickening of the aortic valve. Aortic valve regurgitation is trivial. Mild aortic valve sclerosis is present, with no evidence of aortic valve stenosis.  EKG:  EKG 03/25/2021: Normal sinus rhythm at rate of 85 bpm, left bundle branch block.  No further analysis.  Compared to 04/02/2020, no significant change.   Assessment   1.  Acute pulmonary edema, now resolved with diuresis and BiPAP support. 2.  Acute on chronic systolic and diastolic heart failure. 3.  Primary hypertension 4.  Uncontrolled diabetes mellitus with hyperglycemia without complications. Medications Discontinued During This Encounter  Medication  Reason  . AZO-CRANBERRY PO Patient Preference  . insulin aspart (novoLOG) injection 0-5 Units   . bisoprolol-hydrochlorothiazide (ZIAC) 10-6.25 MG per tablet 1 tablet   . metFORMIN (GLUCOPHAGE-XR) 24 hr tablet 1,000 mg   . minoxidil (LONITEN) tablet 10 mg   . irbesartan (AVAPRO) tablet 300 mg   . furosemide (LASIX) injection 60 mg     Recommendations:   I have evaluated this chart extensively.  Discontinued minoxidil, avapro and switched to Aldactone 12.5 m,g daily, added Entresto 49/51 mb BID, Farxiga 10 mg daily and changed lasix from 60 mg BID to 40 mg daily. Check TSH and serial BNP (ProBNP not found in orderset)  Right and left heart cath Monday. Schedule for cardiac catheterization, and possible angioplasty. We discussed regarding risks, benefits, alternatives to this including stress testing, CTA and continued medical therapy. Patient wants to proceed. Understands <1-2% risk of death, stroke, MI, urgent CABG, bleeding, infection, renal failure but not limited to these.   Transfer the patient to cardiac telemetry, follow-up. BNP and renal function tomorrow.  I spent 60 minutes with the  patient's family with direct face-to-face contact discussing above.   Adrian Prows, MD, [facility] 03/26/2021, 8:34 PM Office: [phone]

## 2021-03-26 NOTE — H&P (Signed)
History and Physical    Brett [name] KYH:[id number] DOB: [birth year] DOA: 03/25/2021  PCP: Azzie Glatter, FNP (Inactive)   Patient coming from: Home  Chief Complaint: SOB  HPI: Brett [name] is a 75 y.o. male with medical history significant for HTN, DMT2, CKD 2, OSA, cardiomyopathy-nonischemic presents by EMS with complaint of sudden onset of SOB. Brett [name] is he was at home when he suddenly began to feel short of breath and over the next 3 to 4 hours shortness of breath progressive symptoms.  Wife states his low back he was struggling to take a breath.  He denied having any chest pain or palpitations.  He has not had any abdominal pain, nausea, vomiting, diarrhea, urinary symptoms.  He states that the shortness of breath got worse if he tried to get up and walk around or do any type of activity.  Initially when he would sit down and rest his breathing will get a little bit better but as the day went on even at rest he was having shortness of breath and gasping for air.  His wife called 911 and patient was found to be hypoxic by EMS and he was placed on CPAP and transported to the hospital.  In the hospital he was continued on BiPAP over a few hours.  And the BiPAP was eventually weaned.  Patient was given Lasix diuresis.  Does not have a history of heart failure.  Denies having any fever or chills.  He has no known sick contacts.  He has no known COVID-19 exposures.  ED Course: Emergency room patient been hemodynamically stable flash pulmonary edema.  Patient's breathing improved and was weaned to nasal cannula and maintaining oxygen saturation on 2 L by nasal cannula.  BG showed pH is 7.323 with a PCO2 of 49.7 and a PO2 of 228 with a bicarb of 25.8.  This was while he was on BiPAP.  Labs showed a glucose of 280 with a creatinine of 1.24 and a BUN of 20, electrolytes normal.  CBC was unremarkable.  BNP was mildly elevated at 124.6.  Hospitalist service was asked to admit for further  management  Review of Systems:  General: Denies weakness, fever, chills, weight loss, night sweats.  Denies dizziness.  Denies change in appetite HENT: Denies head trauma, headache, denies change in hearing, tinnitus.  Denies nasal bleeding.  Denies sore throat, sores in mouth.  Denies difficulty swallowing Eyes: Denies blurry vision, pain in eye, drainage.  Denies discoloration of eyes. Neck: Denies pain.  Denies swelling.  Denies pain with movement. Cardiovascular: Denies chest pain, palpitations. Reports edema.  Denies orthopnea Respiratory: Reports shortness of breath. Denies cough.  Denies wheezing.  Denies sputum production Gastrointestinal: Denies abdominal pain, swelling.  Denies nausea, vomiting, diarrhea.  Denies melena.  Denies hematemesis. Musculoskeletal: Denies limitation of movement.  Denies deformity or swelling.  Denies pain.  Denies arthralgias or myalgias. Genitourinary: Denies pelvic pain.  Denies urinary frequency or hesitancy.  Denies dysuria.  Skin: Denies rash.  Denies petechiae, purpura, ecchymosis. Neurological: Denies syncope.  Denies seizure activity. Denies paresthesia.  Denies slurred speech, drooping face.  Denies visual change. Psychiatry: Denies depression or anxiety. Denies hallucinations.  Past Medical History:  Diagnosis Date  . Acid reflux   . Allergy   . Colon polyp   . Diabetes mellitus without complication ([place])   . Dilated cardiomyopathy ([place])    non isch  . Gout   . Gout   . Hemochromatosis   .  Hypertension   . OSA (obstructive sleep apnea)   . Urine frequency     Past Surgical History:  Procedure Laterality Date  . EYE SURGERY Bilateral    Cataract    Social History  reports that he quit smoking about 44 years ago. His smoking use included cigarettes. He started smoking about 63 years ago. He has a 57.00 pack-year smoking history. He has never used smokeless tobacco. He reports current alcohol use. He reports that he does not use  drugs.  No Known Allergies  Family History  Problem Relation Age of Onset  . Diabetes Father   . Diabetes Other   . Hypertension Other   . Hyperlipidemia Other   . Heart disease Other   . Cancer Maternal Uncle        brain  . Heart attack Brother        49  . Diabetes Brother      Prior to Admission medications   Medication Sig Start Date End Date Taking? Authorizing Provider  allopurinol (ZYLOPRIM) 300 MG tablet TAKE 1 TABLET BY MOUTH  DAILY TO PREVENT GOUT Patient taking differently: Take 300 mg by mouth daily. 08/17/20  Yes [name], NP  amLODipine (NORVASC) 10 MG tablet Take      1 tablet      at Night        for BP Patient taking differently: Take 10 mg by mouth at bedtime. 08/17/20  Yes [name], MD  aspirin 81 MG tablet Take 81 mg by mouth daily.   Yes [provider]  atorvastatin (LIPITOR) 20 MG tablet TAKE 1 TABLET BY MOUTH  DAILY FOR CHOLESTEROL Patient taking differently: Take 20 mg by mouth daily. 08/17/20  Yes [name], NP  bisoprolol-hydrochlorothiazide (ZIAC) 10-6.25 MG tablet TAKE 1 TABLET BY MOUTH  DAILY FOR BLOOD PRESSURE Patient taking differently: Take 1 tablet by mouth daily. 03/18/20  Yes [name], NP  Blood Glucose Monitoring Suppl DEVI Check blood sugar 3 times a day-DX-E11.22 07/07/20  Yes [name], MD  cetirizine (ZYRTEC) 10 MG tablet Take 10 mg by mouth daily.   Yes [provider]  Cholecalciferol (VITAMIN D PO) Take 5,000 Units by mouth daily.   Yes [provider]  D-Mannose 500 MG CAPS Take 1,500 mg by mouth daily.   Yes [provider]  diphenhydramine-acetaminophen (TYLENOL PM) 25-500 MG TABS tablet Take 1 tablet by mouth at bedtime as needed (pain, sleep).   Yes [provider]  famotidine (PEPCID) 40 MG tablet Take 40 mg by mouth daily.   Yes [provider]  furosemide (LASIX) 40 MG tablet TAKE 1 TABLET BY MOUTH  DAILY FOR BP, / FLUID  RETENTION / ANKLE  SWELLING Patient taking differently: Take 40 mg by mouth daily. 08/17/20  Yes [name], NP  glipiZIDE (GLUCOTROL) 10 MG tablet TAKE 1 TABLET BY MOUTH 3  TIMES DAILY WITH MEALS FOR  DIABETES Patient taking differently: Take 10 mg by mouth daily. 07/25/20  Yes [name], NP  glucose blood (IGLUCOSE TEST STRIPS) test strip Check blood sugar 3 times daily-DX-E11.22 07/07/20  Yes [name], MD  insulin isophane & regular human (HUMULIN 70/30 MIX) (70-30) 100 UNIT/ML KwikPen Inject into subcutaneous skin 40min before meals, 45units breakfast, 40units lunch and 22units dinner. Patient taking differently: Inject into subcutaneous skin 34min before meals, 45units breakfast, 25 units lunch and 20units dinner. 12/31/19  Yes McClanahan, Danton Sewer, NP  Insulin Pen Needle 31G X 8 MM MISC Use one  three times a day and as needed [place] 12/20/19  Yes McClanahan, Danton Sewer, NP  Lancets Ultra Fine MISC Check blood sugar 3 times a day-E11.22 07/07/20  Yes [name], MD  Loratadine (CLARITIN PO) Take 1 tablet by mouth daily.   Yes [provider]  magnesium oxide (MAG-OX) 400 MG tablet Take 400 mg by mouth daily.   Yes [provider]  Melatonin 10 MG CAPS Take 10 mg by mouth at bedtime as needed (sleep).   Yes [provider]  metFORMIN (GLUCOPHAGE-XR) 500 MG 24 hr tablet Take    2 tablets    2 x /day    With Meals for Diabetes Patient taking differently: Take 1,000 mg by mouth 2 (two) times daily. 07/05/20  Yes [name], MD  minoxidil (LONITEN) 10 MG tablet Take 1 tablet Daily for BP Patient taking differently: Take 10 mg by mouth daily. 05/11/20  Yes [name], MD  montelukast (SINGULAIR) 10 MG tablet TAKE 1 TABLET BY MOUTH  DAILY FOR ALLERGIES Patient taking differently: Take 10 mg by mouth daily. 08/17/20  Yes [name], NP  olmesartan (BENICAR) 40 MG tablet TAKE 1 TABLET BY MOUTH  DAILY FOR BLOOD PRESSURE Patient taking differently: Take 40 mg by mouth daily.  08/17/20  Yes [name], NP  tadalafil (CIALIS) 20 MG tablet Take 1/2 to 1  tablet every 2 to 3 days if needed for XXXX Patient taking differently: Take 10-20 mg by mouth daily as needed. 07/06/20  Yes [name], MD  tamsulosin (FLOMAX) 0.4 MG CAPS capsule TAKE 1 CAPSULE BY MOUTH AT  BEDTIME FOR PROSTATE Patient taking differently: Take 0.4 mg by mouth at bedtime. 01/01/21  Yes [name], NP    Physical Exam: Vitals:   03/26/21 0015 03/26/21 0030 03/26/21 0045 03/26/21 0059  BP: 133/76 127/71 122/74   Pulse: 71 71 72   Resp: 19 (!) 21 17   Temp:    98.4 F (36.9 C)  TempSrc:    Oral  SpO2: 94% 94% 95%     Constitutional: NAD, calm, comfortable Vitals:   03/26/21 0015 03/26/21 0030 03/26/21 0045 03/26/21 0059  BP: 133/76 127/71 122/74   Pulse: 71 71 72   Resp: 19 (!) 21 17   Temp:    98.4 F (36.9 C)  TempSrc:    Oral  SpO2: 94% 94% 95%    General: WDWN, Alert and oriented x3.  Eyes: EOMI, PERRL, conjunctivae normal.  Sclera nonicteric HENT:  [place]/AT, external ears normal.  Nares patent without epistasis.  Mucous membranes are moist. Posterior pharynx clear of any exudate or lesions. Neck: Soft, normal range of motion, supple, no masses, no thyromegaly.  Trachea midline Respiratory: Diminished breath sounds bilaterally.  Diffuse rales.  Bibasilar crackles present.  No wheezing. Normal respiratory effort. No accessory muscle use.  Cardiovascular: Regular rate and rhythm, no murmurs / rubs / gallops. Has +1 lower extremity edema. 2+ pedal pulses. Abdomen: Soft, no tenderness, nondistended, no rebound or guarding.  No masses palpated. Bowel sounds normoactive Musculoskeletal: FROM. no cyanosis. No joint deformity upper and lower extremities. Normal muscle tone.  Skin: Warm, dry, intact no rashes, lesions, ulcers. No induration Neurologic: CN 2-12 grossly intact.  Normal speech.  Sensation intact, Strength 5/5 in all extremities.   Psychiatric: Normal judgment and  insight.  Normal mood.    Labs on Admission: I have personally reviewed following labs and imaging studies  CBC: Recent Labs  Lab 03/25/21 2206 03/25/21 2227  WBC 10.4  --  NEUTROABS 8.0*  --   HGB 13.5 12.6*  HCT 38.4* 37.0*  MCV 91.6  --   PLT 172  --     Basic Metabolic Panel: Recent Labs  Lab 03/25/21 2206 03/25/21 2227  NA 137 139  K 3.9 3.6  CL 103  --   CO2 27  --   GLUCOSE 280*  --   BUN 20  --   CREATININE 1.24  --   CALCIUM 8.9  --     GFR: CrCl cannot be calculated (Unknown ideal weight.).  Liver Function Tests: Recent Labs  Lab 03/25/21 2206  AST 20  ALT 28  ALKPHOS 33*  BILITOT 1.1  PROT 6.4*  ALBUMIN 3.9    Urine analysis:    Component Value Date/Time   COLORURINE YELLOW 04/02/2020 1021   APPEARANCEUR CLEAR 04/02/2020 1021   LABSPEC 1.014 04/02/2020 1021   PHURINE < OR = 5.0 04/02/2020 1021   GLUCOSEU NEGATIVE 04/02/2020 1021   HGBUR NEGATIVE 04/02/2020 1021   BILIRUBINUR neg 01/11/2021 0946   KETONESUR NEGATIVE 04/02/2020 1021   PROTEINUR Negative 01/11/2021 0946   PROTEINUR NEGATIVE 04/02/2020 1021   UROBILINOGEN 0.2 01/11/2021 0946   UROBILINOGEN 0.2 09/11/2014 1017   NITRITE neg 01/11/2021 0946   NITRITE NEGATIVE 03/07/2019 0841   LEUKOCYTESUR Negative 01/11/2021 0946   LEUKOCYTESUR NEGATIVE 03/07/2019 0841    Radiological Exams on Admission: DG Chest Port 1 View  Result Date: 03/25/2021 CLINICAL DATA:  Hypoxia and difficulty breathing for 2 days. EXAM: PORTABLE CHEST 1 VIEW COMPARISON:  Chest radiograph 09/11/2014 FINDINGS: The heart is enlarged. Mild to moderate pulmonary edema. Trace fluid in the right minor fissure. No large subpulmonic effusion. Bibasilar atelectasis without confluent airspace disease. No pneumothorax. No acute osseous abnormalities are seen. IMPRESSION: CHF.  Cardiomegaly with pulmonary edema and fluid in the fissures. Electronically Signed   By: Keith Rake M.D.   On: 03/25/2021 22:18    EKG:  Independently reviewed.  EKG shows rhythm with no acute ST elevation or depression.  Left bundle branch block present.  QTc prolonged at 532  Assessment/Plan Active Problems:   Acute respiratory failure with hypoxia  Brett [name] scented with acute respiratory failure with hypoxia and initially required therapy with BiPAP.  BiPAP has been weaned off and patient is now on oxygen by nasal cannula.  He is maintaining O2 sats greater than 93% at this time.  This x-ray revealed pulmonary edema and cardiomegaly.  Patient's last echocardiogram was over 10 years ago. RT to follow.  Use BiPAP if necessary Incentive spirometer every 2 hours while awake    Pulmonary edema Patient was given Lasix in the emergency room with good diuresis.  Continue Lasix 60 mg IV twice a day for the next few days. Monitor I&Os and daily weight. Check echocardiogram in the morning to evaluate wall motion, EF and valvular structure/function Patient was treated with nitroglycerin infusion in the emergency room.  Infusion will be continued and weaned as tolerated Check serial troponin levels.    Hypertension Patient be continued on his home medications of ARB therapy, Ziac, Norvasc.  Monitor blood pressure    Type 2 diabetes mellitus with stage 2 chronic kidney disease, with long-term current use of insulin  Continue patient's home.  Continue metformin twice a day. Monitor blood sugars with meals and at bedtime. SSI as needed for glycemic control Check hemoglobin A1c    Cardiomegaly Echocardiogram in the morning. Continue statin therapy.  Check lipid panel in the morning  Prolonged QT interval Medications were to further prolong QT interval.  Monitor on telemetry    DVT prophylaxis: Lovenox for DVT prophylaxis Code Status:   Full code Family Communication:  Brett [name] and plan discussed with patient and his wife is at bedside.  They verbalized understanding agree with plan.  Further recommendations to follow as  clinically indicated Disposition Plan:   Patient is from:  Home  Anticipated DC to:  Home  Anticipated DC date:  Anticipate 2 midnight or more stay in the hospital  Anticipated DC barriers: No barriers to discharge identified at this time  Admission status:  Inpatient  Yevonne Aline Alizee Maple MD [facility]  How to contact the [facility] Attending or Consulting provider [place] or covering provider during after hours [place], for this patient?   1. Check the care team in [facility] and look for a) attending/consulting [facility] provider listed and b) the [facility] team listed 2. Log into [email or web address] and use [place]'s universal password to access. If you do not have the password, please contact the hospital operator. 3. Locate the [facility] provider you are looking for under [facility] and page to a number that you can be directly reached. 4. If you still have difficulty reaching the provider, please page the [facility] (Director on Call) for the Hospitalists listed on amion for assistance.  03/26/2021, 1:39 AM

## 2021-03-26 NOTE — Progress Notes (Signed)
Pt received from ER at 0130 admitted to room 25.  Pt skin checked w/ 2nd nurse, cleaned and given CHG bath. Gown changed.  Pt continued on 2 L [place]. Pt given instructions on calling for assistance.  Pt on nitro drip; VS checked, no fever and VS WNL.  Pt given call bell and urinal in reach of bed.  Bed alarm activated, pt resting comfortably.

## 2021-03-27 LAB — BASIC METABOLIC PANEL
Anion gap: 9 (ref 5–15)
BUN: 20 mg/dL (ref 8–23)
CO2: 28 mmol/L (ref 22–32)
Calcium: 8.9 mg/dL (ref 8.9–10.3)
Chloride: 102 mmol/L (ref 98–111)
Creatinine, Ser: 1.26 mg/dL — ABNORMAL HIGH (ref 0.61–1.24)
GFR, Estimated: 60 mL/min — ABNORMAL LOW (ref >60)
Glucose, Bld: 130 mg/dL — ABNORMAL HIGH (ref 70–99)
Potassium: 3.4 mmol/L — ABNORMAL LOW (ref 3.5–5.1)
Sodium: 139 mmol/L (ref 135–145)

## 2021-03-27 LAB — GLUCOSE, CAPILLARY
Glucose-Capillary: 184 mg/dL — ABNORMAL HIGH (ref 70–99)
Glucose-Capillary: 301 mg/dL — ABNORMAL HIGH (ref 70–99)
Glucose-Capillary: 182 mg/dL — ABNORMAL HIGH (ref 70–99)
Glucose-Capillary: 159 mg/dL — ABNORMAL HIGH (ref 70–99)

## 2021-03-27 LAB — BRAIN NATRIURETIC PEPTIDE: B Natriuretic Peptide: 107.5 pg/mL — ABNORMAL HIGH (ref 0.0–100.0)

## 2021-03-27 LAB — MAGNESIUM: Magnesium: 2.2 mg/dL (ref 1.7–2.4)

## 2021-03-27 MED ORDER — SPIRONOLACTONE 12.5 MG HALF TABLET
12.5000 mg | ORAL_TABLET | Freq: Once | ORAL | Status: AC
  Administered 2021-03-27: 12.5 mg via ORAL
  Filled 2021-03-27: qty 1

## 2021-03-27 MED ORDER — HALOPERIDOL LACTATE 5 MG/ML IJ SOLN: 1.0000 mg | Freq: Four times a day (QID) | INTRAMUSCULAR | Status: DC | PRN

## 2021-03-27 MED ORDER — SPIRONOLACTONE 25 MG PO TABS
25.0000 mg | ORAL_TABLET | Freq: Every day | ORAL | Status: DC
  Administered 2021-03-28 – 2021-03-31 (×4): 25 mg via ORAL
  Filled 2021-03-27 (×4): qty 1

## 2021-03-27 MED ORDER — FUROSEMIDE 10 MG/ML IJ SOLN
40.0000 mg | Freq: Two times a day (BID) | INTRAMUSCULAR | Status: DC
  Administered 2021-03-27 – 2021-03-28 (×2): 40 mg via INTRAVENOUS
  Filled 2021-03-27 (×2): qty 4

## 2021-03-27 MED ORDER — POTASSIUM CHLORIDE CRYS ER 20 MEQ PO TBCR
40.0000 meq | EXTENDED_RELEASE_TABLET | Freq: Once | ORAL | Status: AC
  Administered 2021-03-27: 40 meq via ORAL
  Filled 2021-03-27: qty 2

## 2021-03-27 NOTE — Progress Notes (Signed)
I opened this chart by mistake, trying to give pain med for room 11 for another RN on break

## 2021-03-27 NOTE — Progress Notes (Signed)
PROGRESS NOTE    Brett [name]  ZOX:[id number] DOB: 01-13-1946 DOA: 03/25/2021 PCP: Azzie Glatter, FNP (Inactive)    Chief Complaint  Patient presents with  . Respiratory Distress    Brief Narrative:  Patient is a 75 y.o. male with history of hereditary hemochromatosis, HTN, DM-2-who presented to the hospital with several days history of exertional dyspnea, lower extremity edema-on day of admission-his shortness of breath worsened significantly-he was brought to the ED and required BiPAP.  He was placed on IV nitroglycerin drip-given IV Lasix and responded well.  He was subsequently admitted to the hospitalist service for further evaluation and treatment.  Assessment & Plan:   Active Problems:   Hypertension   Type 2 diabetes mellitus with stage 2 chronic kidney disease, with long-term current use of insulin (HCC)   Acute respiratory failure with hypoxia (HCC)   Pulmonary edema   Cardiomegaly   Prolonged QT interval   Acute hypoxic respiratory failure probably secondary to pulmonary edema from decompensated heart failure. He was initially on BiPAP, transition to oxygen.  He is weaned off oxygen today and on room air.    Acute systolic heart failure/ischemic cardiomyopathy Patient was started on IV Lasix, increase Lasix to 40 mg twice daily, continue with spironolactone 25 mg daily, Farxiga and entresto. Continue strict intake and output and daily weights,  cardiology on board recommends left and right heart catheterization on Monday.   Plan to hold [facility] tomorrow and on the day of cardiac catheterization.    Hereditary hemochromatosis   Essential hypertension Blood pressure parameters appear to be optimal    Type 2 diabetes mellitus, insulin-dependent with stage 2 cKD. CBG (last 3)  Recent Labs    03/26/21 2124 03/27/21 0802 03/27/21 1212  GLUCAP 164* 184* 301*   Uncontrolled with hyperglycemia.  Continue with novolog 70/30 15 unitd tid and SSI.     Prolonged Qt interval:  Replace potassium. And keep it greater than 4. And magnesium >2.  Check magnesium levels.     Hyperlipidemia:  Resume lipitor.   Hypokalemia replaced.   Hospital delirium, Reorientation encouraged.    BPH:  Continue with flomax.     DVT prophylaxis: (Lovenox) Code Status: (Full code) Family Communication: daughter at bedside.  Disposition:   Status is: Inpatient  Remains inpatient appropriate because:Ongoing diagnostic testing needed not appropriate for outpatient work up and Unsafe d/c plan   Dispo: The patient is from: Home              Anticipated d/c is to: Home              Patient currently is not medically stable to d/c.   Difficult to place patient No       Consultants:   Cardiology.    Procedures: none.    Antimicrobials:none.    Subjective: No chest pain or sob.  Slightly delirious this am, but back to baseline.   Objective: Vitals:   03/27/21 0341 03/27/21 0748 03/27/21 0800 03/27/21 1211  BP:  133/79  134/64  Pulse:   62 63  Resp:   16 18  Temp:   98.9 F (37.2 C) 98 F (36.7 C)  TempSrc:   Oral Oral  SpO2:  92% 94% 93%  Weight: 100 kg       Intake/Output Summary (Last 24 hours) at 03/27/2021 1422 Last data filed at 03/27/2021 1220 Gross per 24 hour  Intake 1481.14 ml  Output 2100 ml  Net -618.86 ml   Filed  Weights   03/26/21 0203 03/27/21 0341  Weight: 107.7 kg 100 kg    Examination:  General exam: Appears calm and comfortable  Respiratory system: Clear to auscultation. Respiratory effort normal. Cardiovascular system: S1 & S2 heard, RRR. No JVD,  Gastrointestinal system: Abdomen is nondistended, soft and nontender.. Normal bowel sounds heard. Central nervous system: Alert and oriented. No focal neurological deficits. Extremities: pedal edema present.  Skin: No rashes, lesions or ulcers Psychiatry:  Mood & affect appropriate.     Data Reviewed: I have personally reviewed following labs  and imaging studies  CBC: Recent Labs  Lab 03/25/21 2206 03/25/21 2227 03/26/21 0326  WBC 10.4  --  9.1  NEUTROABS 8.0*  --   --   HGB 13.5 12.6* 13.2  HCT 38.4* 37.0* 36.9*  MCV 91.6  --  89.8  PLT 172  --  416    Basic Metabolic Panel: Recent Labs  Lab 03/25/21 2206 03/25/21 2227 03/26/21 0728 03/27/21 0331  NA 137 139 139 139  K 3.9 3.6 3.4* 3.4*  CL 103  --  103 102  CO2 27  --  27 28  GLUCOSE 280*  --  180* 130*  BUN 20  --  18 20  CREATININE 1.24  --  1.16 1.26*  CALCIUM 8.9  --  8.8* 8.9    GFR: Estimated Creatinine Clearance: 55.9 mL/min (A) (by C-G formula based on SCr of 1.26 mg/dL (H)).  Liver Function Tests: Recent Labs  Lab 03/25/21 2206  AST 20  ALT 28  ALKPHOS 33*  BILITOT 1.1  PROT 6.4*  ALBUMIN 3.9    CBG: Recent Labs  Lab 03/26/21 1125 03/26/21 1646 03/26/21 2124 03/27/21 0802 03/27/21 1212  GLUCAP 222* 253* 164* 184* 301*     Recent Results (from the past 240 hour(s))  SARS CORONAVIRUS 2 (TAT 6-24 HRS) Nasopharyngeal Nasopharyngeal Swab     Status: None   Collection Time: 03/25/21  9:48 PM   Specimen: Nasopharyngeal Swab  Result Value Ref Range Status   SARS Coronavirus 2 NEGATIVE NEGATIVE Final    Comment: (NOTE) SARS-CoV-2 target nucleic acids are NOT DETECTED.  The SARS-CoV-2 RNA is generally detectable in upper and lower respiratory specimens during the acute phase of infection. Negative results do not preclude SARS-CoV-2 infection, do not rule out co-infections with other pathogens, and should not be used as the sole basis for treatment or other patient management decisions. Negative results must be combined with clinical observations, patient history, and epidemiological information. The expected result is Negative.  Fact Sheet for Patients: SugarRoll.be  Fact Sheet for Healthcare Providers: https://www.woods-mathews.com/  This test is not yet approved or cleared by the  [place] FDA and  has been authorized for detection and/or diagnosis of SARS-CoV-2 by FDA under an Emergency Use Authorization (EUA). This EUA will remain  in effect (meaning this test can be used) for the duration of the COVID-19 declaration under Se ction 564(b)(1) of the Act, 21 U.S.C. section 360bbb-3(b)(1), unless the authorization is terminated or revoked sooner.  Performed at [facility], [place] [address] [place], [place] [address]          Radiology Studies: DG Chest Port 1 View  Result Date: 03/25/2021 CLINICAL DATA:  Hypoxia and difficulty breathing for 2 days. EXAM: PORTABLE CHEST 1 VIEW COMPARISON:  Chest radiograph 09/11/2014 FINDINGS: The heart is enlarged. Mild to moderate pulmonary edema. Trace fluid in the right minor fissure. No large subpulmonic effusion. Bibasilar atelectasis without confluent  airspace disease. No pneumothorax. No acute osseous abnormalities are seen. IMPRESSION: CHF.  Cardiomegaly with pulmonary edema and fluid in the fissures. Electronically Signed   By: Keith Rake M.D.   On: 03/25/2021 22:18   ECHOCARDIOGRAM COMPLETE  Result Date: 03/26/2021    ECHOCARDIOGRAM REPORT   Patient Name:   MICHAIAH HOLSOPPLE Date of Exam: 03/26/2021 Medical Rec #:  [id number]      Height:       65.0 in Accession #:    [id number]     Weight:       237.4 lb Date of Birth:  1946/08/20      BSA:          2.128 m Patient Age:    74 years       BP:           124/72 mmHg Patient Gender: M              HR:           66 bpm. Exam Location:  Inpatient Procedure: 2D Echo Indications:    dyspnea  History:        Patient has prior history of Echocardiogram examinations, most                 recent 01/08/2010. Cardiomegaly; Risk Factors:Hypertension,                 Diabetes and Sleep Apnea.  Sonographer:    Johny Chess Referring Phys: [id number] [place]  1. Left ventricular ejection fraction, by estimation, is 30 to 35%. The left ventricle has moderately  decreased function. The left ventricle demonstrates regional wall motion abnormalities (see scoring diagram/findings for description). The mid-to-apical inferoseptal, mid-to-apical anteroseptal, mid-to-apical inferior, and apex appear severely hypokinetic. The left ventricular internal cavity size was mildly dilated. There is moderate asymmetric hypertrophy of the basal septum (measures 1.5cm). The rest of the LV segments demonstrate mild left ventricular hypertrophy. Left ventricular diastolic parameters are consistent with Grade II diastolic dysfunction (pseudonormalization). Elevated left atrial pressure.  2. Right ventricular systolic function is normal. The right ventricular size is normal. Tricuspid regurgitation signal is inadequate for assessing PA pressure.  3. The mitral valve is grossly normal. Trivial mitral valve regurgitation. No evidence of mitral stenosis.  4. The aortic valve is tricuspid. There is mild thickening of the aortic valve. Aortic valve regurgitation is trivial. Mild aortic valve sclerosis is present, with no evidence of aortic valve stenosis. Comparison(s): Compared to prior echo report in 2011, the LVEF is now moderately depressed at 30-35% with wall motion abnormalities as described above. FINDINGS  Left Ventricle: Left ventricular ejection fraction, by estimation, is 30 to 35%. The left ventricle has moderately decreased function. The left ventricle demonstrates regional wall motion abnormalities. The mid-to-apical inferoseptal, mid-to-apical anteroseptal, mid-to-apical inferior, and apex appear severely hypokinetic. The left ventricular internal cavity size was mildly dilated. There is moderate asymmetric hypertrophy of the basal septum (measures 1.5cm). The rest of the LV segments demonstrate mild left ventricular hypertrophy. Left ventricular diastolic parameters are consistent with Grade II diastolic dysfunction (pseudonormalization). Elevated left atrial pressure. Right  Ventricle: The right ventricular size is normal. No increase in right ventricular wall thickness. Right ventricular systolic function is normal. Tricuspid regurgitation signal is inadequate for assessing PA pressure. Left Atrium: Left atrial size was normal in size. Right Atrium: Right atrial size was normal in size. Pericardium: There is no evidence of pericardial effusion. Mitral Valve: The mitral valve is  grossly normal. There is mild thickening of the mitral valve leaflet(s). There is mild calcification of the mitral valve leaflet(s). Mild to moderate mitral annular calcification. Trivial mitral valve regurgitation. No evidence of mitral valve stenosis. Tricuspid Valve: The tricuspid valve is normal in structure. Tricuspid valve regurgitation is trivial. Aortic Valve: The aortic valve is tricuspid. There is mild thickening of the aortic valve. Aortic valve regurgitation is trivial. Mild aortic valve sclerosis is present, with no evidence of aortic valve stenosis. Pulmonic Valve: The pulmonic valve was normal in structure. Pulmonic valve regurgitation is trivial. Aorta: The aortic root and ascending aorta are structurally normal, with no evidence of dilitation. Venous: The inferior vena cava was not well visualized. IAS/Shunts: No atrial level shunt detected by color flow Doppler.  LEFT VENTRICLE PLAX 2D LVIDd:         6.30 cm      Diastology LVIDs:         4.70 cm      LV e' medial:    4.90 cm/s LV PW:         1.10 cm      LV E/e' medial:  20.1 LV IVS:        1.50 cm      LV e' lateral:   5.11 cm/s LVOT diam:     2.00 cm      LV E/e' lateral: 19.3 LV SV:         67 LV SV Index:   31 LVOT Area:     3.14 cm  LV Volumes (MOD) LV vol d, MOD A2C: 148.0 ml LV vol d, MOD A4C: 151.0 ml LV vol s, MOD A2C: 91.7 ml LV vol s, MOD A4C: 106.0 ml LV SV MOD A2C:     56.3 ml LV SV MOD A4C:     151.0 ml LV SV MOD BP:      53.5 ml RIGHT VENTRICLE RV S prime:     14.10 cm/s TAPSE (M-mode): 2.1 cm LEFT ATRIUM             Index        RIGHT ATRIUM           Index LA diam:        4.80 cm 2.26 cm/m  RA Area:     14.60 cm LA Vol (A2C):   57.1 ml 26.84 ml/m RA Volume:   35.60 ml  16.73 ml/m LA Vol (A4C):   71.8 ml 33.74 ml/m LA Biplane Vol: 69.1 ml 32.48 ml/m  AORTIC VALVE LVOT Vmax:   94.90 cm/s LVOT Vmean:  60.600 cm/s LVOT VTI:    0.213 m  AORTA Ao Root diam: 3.20 cm Ao Asc diam:  2.90 cm MITRAL VALVE MV Area (PHT): 3.99 cm     SHUNTS MV Decel Time: 190 msec     Systemic VTI:  0.21 m MV E velocity: 98.50 cm/s   Systemic Diam: 2.00 cm MV A velocity: 108.00 cm/s MV E/A ratio:  0.91 Gwyndolyn Kaufman MD Electronically signed by Gwyndolyn Kaufman MD Signature Date/Time: 03/26/2021/4:21:57 PM    Final         Scheduled Meds: . allopurinol  300 mg Oral Daily  . amLODipine  10 mg Oral Daily  . aspirin EC  81 mg Oral Daily  . atorvastatin  20 mg Oral Daily  . bisoprolol  10 mg Oral Daily  . dapagliflozin propanediol  10 mg Oral Daily  . enoxaparin (LOVENOX) injection  40 mg Subcutaneous Daily  .  famotidine  40 mg Oral Daily  . furosemide  40 mg Intravenous BID  . insulin aspart  0-15 Units Subcutaneous TID WC  . insulin aspart  0-5 Units Subcutaneous QHS  . insulin aspart protamine- aspart  15 Units Subcutaneous TID WC  . magnesium oxide  400 mg Oral Daily  . sacubitril-valsartan  1 tablet Oral BID  . sodium chloride flush  3 mL Intravenous Q12H  . [START ON 03/28/2021] spironolactone  25 mg Oral Daily  . tamsulosin  0.4 mg Oral QHS   Continuous Infusions: . sodium chloride    . nitroGLYCERIN Stopped (03/26/21 0925)     LOS: 1 day        Hosie Poisson, MD [facility]   To contact the attending provider between 7A-7P or the covering provider during after hours 7P-7A, please log into the web site [email or web address] and access using universal [place] password for that web site. If you do not have the password, please call the hospital operator.  03/27/2021, 2:22 PM

## 2021-03-27 NOTE — Progress Notes (Signed)
Subjective:  Breathing improved. Had an episode of disorientation and confusion.  Was getting dressed to go home.  Reoriented. Appears back to baseline now.  Objective:  Vital Signs in the last 24 hours: Temp:  [98 F (36.7 C)-98.9 F (37.2 C)] 98.9 F (37.2 C) (06/04 0800) Pulse Rate:  [62-72] 62 (06/04 0800) Resp:  [16-20] 16 (06/04 0800) BP: (91-148)/(67-93) 133/79 (06/04 0748) SpO2:  [92 %-99 %] 94 % (06/04 0800) FiO2 (%):  [60 %] 60 % (06/03 1900) Weight:  [100 kg] 100 kg (06/04 0341)  Intake/Output from previous day: 06/03 0701 - 06/04 0700 In: 1321.1 [P.O.:1260; I.V.:61.1] Out: 1875 [Urine:1875]  Physical Exam Vitals and nursing note reviewed.  Constitutional:      General: He is not in acute distress.    Appearance: He is well-developed.  HENT:     Head: Normocephalic and atraumatic.  Eyes:     Conjunctiva/sclera: Conjunctivae normal.     Pupils: Pupils are equal, round, and reactive to light.  Neck:     Vascular: No JVD.  Cardiovascular:     Rate and Rhythm: Normal rate and regular rhythm.     Pulses: Normal pulses and intact distal pulses.     Heart sounds: No murmur heard.   Pulmonary:     Effort: Pulmonary effort is normal.     Breath sounds: Normal breath sounds. No wheezing or rales.  Abdominal:     General: Bowel sounds are normal.     Palpations: Abdomen is soft.     Tenderness: There is no rebound.  Musculoskeletal:        General: No tenderness. Normal range of motion.     Right lower leg: Edema (3+) present.     Left lower leg: Edema (3+) present.  Lymphadenopathy:     Cervical: No cervical adenopathy.  Skin:    General: Skin is warm and dry.  Neurological:     Mental Status: He is alert and oriented to person, place, and time.     Cranial Nerves: No cranial nerve deficit.      Lab Results: BMP Recent Labs    04/02/20 1021 07/06/20 1429 07/06/20 1429 09/16/20 1127 03/25/21 2206 03/25/21 2227 03/26/21 0728 03/27/21 0331  NA  139 138  --  138 137 139 139 139  K 4.2 3.9  --  4.0 3.9 3.6 3.4* 3.4*  CL 103 100  --  100 103  --  103 102  CO2 28 29  --  23 27  --  27 28  GLUCOSE 156* 143*  --  285* 280*  --  180* 130*  BUN 17 27*  --  20 20  --  18 20  CREATININE 1.04 1.15   < > 1.06 1.24  --  1.16 1.26*  CALCIUM 9.5 9.5  --  9.7 8.9  --  8.8* 8.9  GFRNONAA 71 63  --  69 >60  --  >60 60*  GFRAA 82 73  --  80  --   --   --   --    < > = values in this interval not displayed.    CBC Recent Labs  Lab 03/25/21 2206 03/25/21 2227 03/26/21 0326  WBC 10.4  --  9.1  RBC 4.19*  --  4.11*  HGB 13.5   < > 13.2  HCT 38.4*   < > 36.9*  PLT 172  --  184  MCV 91.6  --  89.8  MCH 32.2  --  32.1  MCHC 35.2  --  35.8  RDW 13.7  --  13.6  LYMPHSABS 1.2  --   --   MONOABS 0.9  --   --   EOSABS 0.2  --   --   BASOSABS 0.0  --   --    < > = values in this interval not displayed.    HEMOGLOBIN A1C Lab Results  Component Value Date   HGBA1C 7.6 (H) 03/26/2021   MPG 171 03/26/2021    Cardiac Panel (last 3 results) No results for input(s): CKTOTAL, CKMB, TROPONINI, RELINDX in the last 8760 hours.  BNP (last 3 results) Recent Labs    03/25/21 2206 03/26/21 1830 03/27/21 0331  BNP 124.6* 193.5* 107.5*    TSH Recent Labs    07/06/20 1429 09/16/20 1127 03/26/21 1811  TSH 2.91 4.820* 3.764    Lipid Panel     Component Value Date/Time   CHOL 111 03/26/2021 0326   TRIG 42 03/26/2021 0326   HDL 39 (L) 03/26/2021 0326   CHOLHDL 2.8 03/26/2021 0326   VLDL 8 03/26/2021 0326   LDLCALC 64 03/26/2021 0326   LDLCALC 56 07/06/2020 1429     Hepatic Function Panel Recent Labs    07/06/20 1429 09/16/20 1127 03/25/21 2206  PROT 6.5 6.8 6.4*  ALBUMIN  --  4.5 3.9  AST 20 17 20   ALT 29 27 28   ALKPHOS  --  54 33*  BILITOT 0.6 0.7 1.1    Imaging: Chest x-ray 03/25/2021: PORTABLE CHEST 1 VIEW  COMPARISON:  Chest radiograph 09/11/2014  FINDINGS: The heart is enlarged. Mild to moderate pulmonary  edema. Trace fluid in the right minor fissure. No large subpulmonic effusion. Bibasilar atelectasis without confluent airspace disease. No pneumothorax. No acute osseous abnormalities are seen.  IMPRESSION: CHF.  Cardiomegaly with pulmonary edema and fluid in the fissures.    Cardiac Studies:  EKG 03/25/2021: Sinus rhythm 85 bpm Left bundle branch block  Echocardiogram 03/26/2021: 1. Left ventricular ejection fraction, by estimation, is 30 to 35%. The  left ventricle has moderately decreased function. The left ventricle  demonstrates regional wall motion abnormalities (see scoring  diagram/findings for description). The  mid-to-apical inferoseptal, mid-to-apical anteroseptal, mid-to-apical  inferior, and apex appear severely hypokinetic. The left ventricular  internal cavity size was mildly dilated. There is moderate asymmetric  hypertrophy of the basal septum (measures  1.5cm). The rest of the LV segments demonstrate mild left ventricular  hypertrophy. Left ventricular diastolic parameters are consistent with  Grade II diastolic dysfunction (pseudonormalization). Elevated left atrial  pressure.  2. Right ventricular systolic function is normal. The right ventricular  size is normal. Tricuspid regurgitation signal is inadequate for assessing  PA pressure.  3. The mitral valve is grossly normal. Trivial mitral valve  regurgitation. No evidence of mitral stenosis.  4. The aortic valve is tricuspid. There is mild thickening of the aortic  valve. Aortic valve regurgitation is trivial. Mild aortic valve sclerosis  is present, with no evidence of aortic valve stenosis.   Comparison(s): Compared to prior echo report in 2011, the LVEF is now  moderately depressed at 30-35% with wall motion abnormalities as described  above.   Assessment & Recommendations:  75 year old Caucasian male with hereditary hemochromatosis, hypertension, type 2 diabetes mellitus, untreated OSA, former  >40-pack-year smoker, with new diagnosis of acute HFrEF  HFrEF: Acute, new diagnosis, NYHA class IV Acute pulmonary edema resolved with diuresis and BiPAP support. Ischemic cardiomyopathy high on differential. Currently on bisoprolol 10  mg daily, Farxiga 10 mg daily, IV Lasix 40 mg daily, Entresto 49-50 mg twice daily, spironolactone 12.5 mg daily.  Increase spironolactone to 25 mg daily. (Received 12.5 mg today.  Will give additional 12.5 mg.) Diuresing well with 1.8 L output/24 hours.  Still has massive volume overload. Increase Lasix to 40 mg twice daily for next 2 days. We will plan on performing right and left heart catheterization, coronary angiography and possible intervention on Monday, 03/29/2021.  Recommend holding Entresto on 03/28/2021 evening and 03/29/2021 morning. If no obstructive CAD noted, differentials should include cardiac hemochromatosis.  Anasarca: HFrEF remains likely etiology.  However, given his history of hemochromatosis, consider evaluation with liver ultrasound.  Troponin elevation: Type II MI in the setting of acute heart failure   Nigel Mormon, MD Pager: [phone] Office: [phone]

## 2021-03-28 ENCOUNTER — Inpatient Hospital Stay (HOSPITAL_COMMUNITY): Payer: Medicare Other

## 2021-03-28 DIAGNOSIS — I5020 Unspecified systolic (congestive) heart failure: Secondary | ICD-10-CM

## 2021-03-28 LAB — BASIC METABOLIC PANEL
Anion gap: 9 (ref 5–15)
BUN: 26 mg/dL — ABNORMAL HIGH (ref 8–23)
CO2: 29 mmol/L (ref 22–32)
Calcium: 9 mg/dL (ref 8.9–10.3)
Chloride: 101 mmol/L (ref 98–111)
Creatinine, Ser: 1.35 mg/dL — ABNORMAL HIGH (ref 0.61–1.24)
GFR, Estimated: 55 mL/min — ABNORMAL LOW (ref >60)
Glucose, Bld: 71 mg/dL (ref 70–99)
Potassium: 3 mmol/L — ABNORMAL LOW (ref 3.5–5.1)
Sodium: 139 mmol/L (ref 135–145)

## 2021-03-28 LAB — GLUCOSE, CAPILLARY
Glucose-Capillary: 117 mg/dL — ABNORMAL HIGH (ref 70–99)
Glucose-Capillary: 227 mg/dL — ABNORMAL HIGH (ref 70–99)
Glucose-Capillary: 134 mg/dL — ABNORMAL HIGH (ref 70–99)
Glucose-Capillary: 176 mg/dL — ABNORMAL HIGH (ref 70–99)

## 2021-03-28 LAB — BRAIN NATRIURETIC PEPTIDE: B Natriuretic Peptide: 65 pg/mL (ref 0.0–100.0)

## 2021-03-28 LAB — MAGNESIUM: Magnesium: 2.6 mg/dL — ABNORMAL HIGH (ref 1.7–2.4)

## 2021-03-28 IMAGING — DX DG CHEST 1V PORT
1 series · 1 of 1 positions shown · non-contrast
Comparison: Portable exam [52] hours compared to [DATE]

CLINICAL DATA: Pulmonary edema, follow-up, history diabetes
mellitus, hypertension, heart failure

EXAM:
PORTABLE CHEST 1 VIEW

[chest ap]
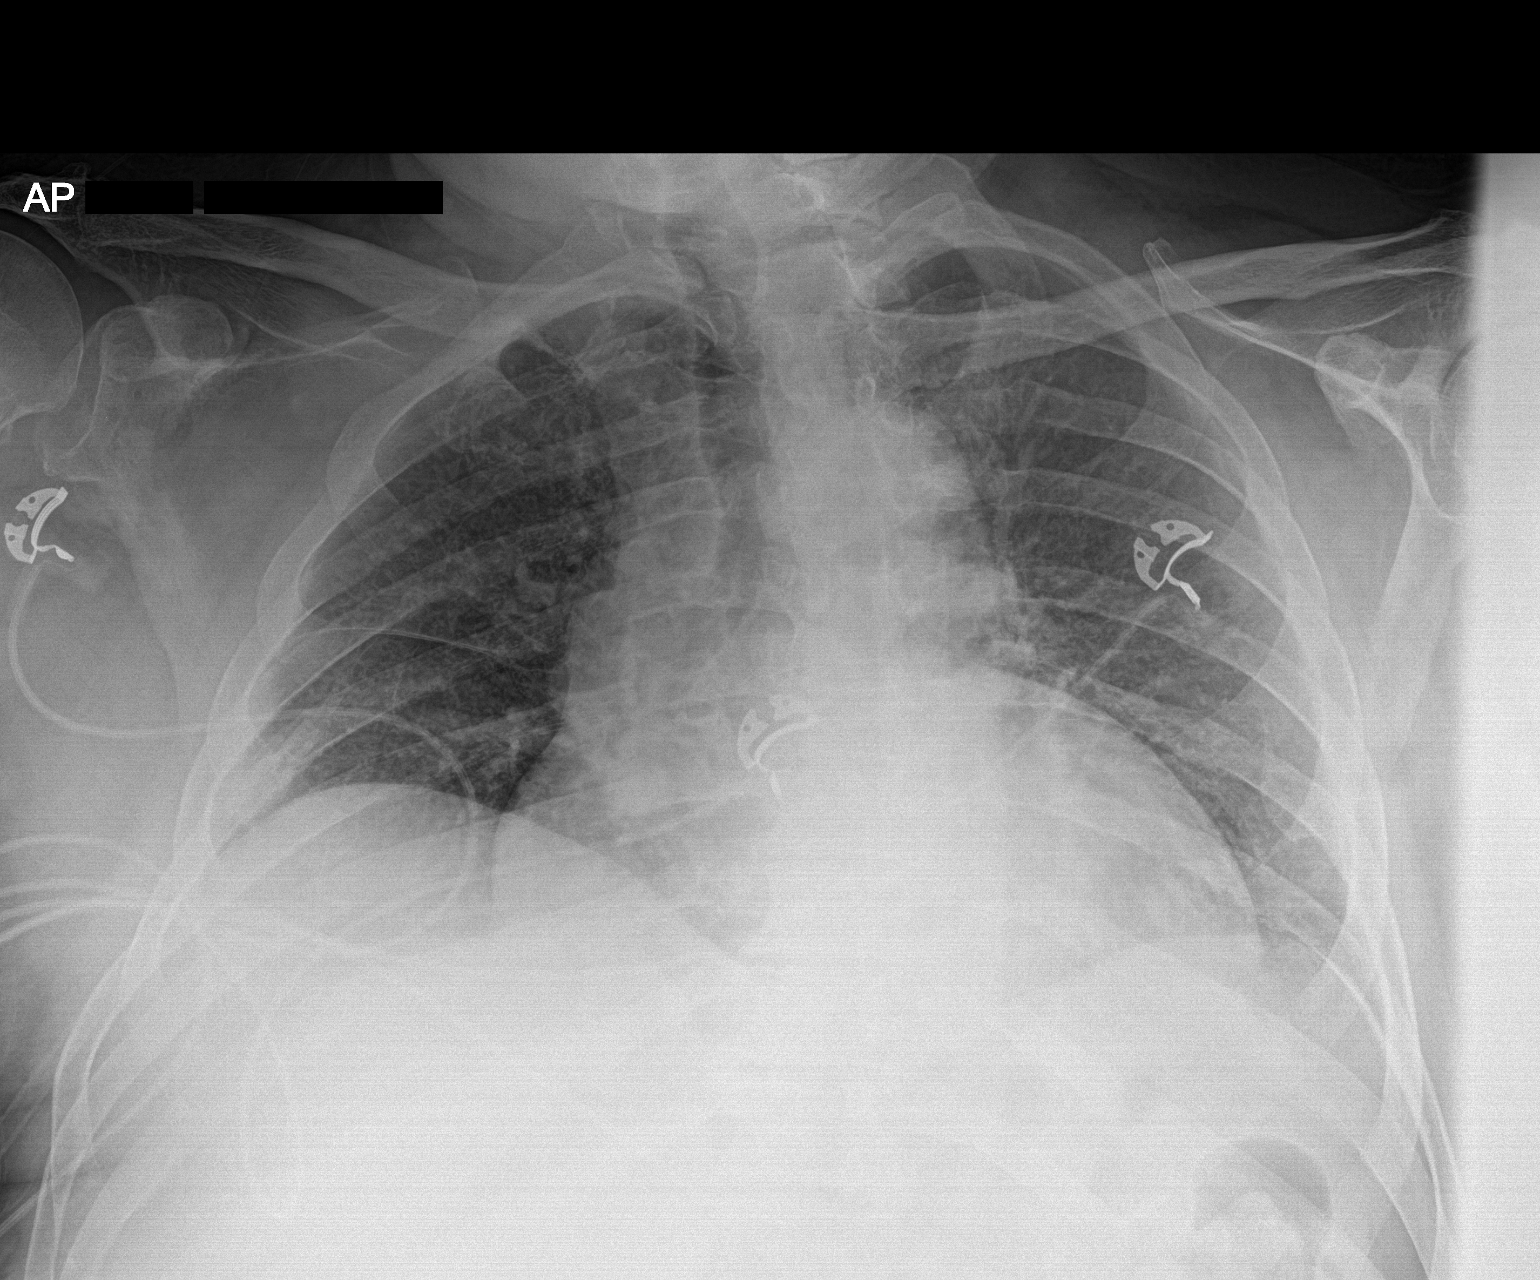

[1 of 1 positions shown; findings below may reference images not displayed]

FINDINGS: Enlargement of cardiac silhouette.

Mediastinal contours and pulmonary vascularity normal.

Minimal interstitial prominence consistent with mild residual
pulmonary edema, slightly improved.

No pleural effusion or pneumothorax.

Bones unremarkable.
IMPRESSION: Slightly improved pulmonary edema.

## 2021-03-28 MED ORDER — ASPIRIN 81 MG PO CHEW
81.0000 mg | CHEWABLE_TABLET | ORAL | Status: AC
  Administered 2021-03-29: 81 mg via ORAL
  Filled 2021-03-28: qty 1

## 2021-03-28 MED ORDER — SODIUM CHLORIDE 0.9% FLUSH: 3.0000 mL | INTRAVENOUS | Status: DC | PRN

## 2021-03-28 MED ORDER — ASPIRIN EC 81 MG PO TBEC
81.0000 mg | DELAYED_RELEASE_TABLET | Freq: Every day | ORAL | Status: DC
  Administered 2021-03-30 – 2021-03-31 (×2): 81 mg via ORAL
  Filled 2021-03-28 (×2): qty 1

## 2021-03-28 MED ORDER — SODIUM CHLORIDE 0.9 % IV SOLN: INTRAVENOUS | Status: DC

## 2021-03-28 MED ORDER — SODIUM CHLORIDE 0.9% FLUSH
3.0000 mL | Freq: Two times a day (BID) | INTRAVENOUS | Status: DC
  Administered 2021-03-28: 3 mL via INTRAVENOUS

## 2021-03-28 MED ORDER — POTASSIUM CHLORIDE CRYS ER 20 MEQ PO TBCR
40.0000 meq | EXTENDED_RELEASE_TABLET | Freq: Two times a day (BID) | ORAL | Status: DC
  Administered 2021-03-28: 40 meq via ORAL
  Filled 2021-03-28: qty 2

## 2021-03-28 MED ORDER — SODIUM CHLORIDE 0.9 % IV SOLN: 250.0000 mL | INTRAVENOUS | Status: DC | PRN

## 2021-03-28 NOTE — Progress Notes (Signed)
Patient desating in the 70s placed back on  at 5 liters. Saturation at 94% on 5 liters.

## 2021-03-28 NOTE — Progress Notes (Signed)
PROGRESS NOTE    Brett [name]  QAS:[id number] DOB: [birth year] DOA: 03/25/2021 PCP: Azzie Glatter, FNP (Inactive)    Chief Complaint  Patient presents with  . Respiratory Distress    Brief Narrative:  Patient is a 75 y.o. male with history of hereditary hemochromatosis, HTN, DM-2-who presented to the hospital with several days history of exertional dyspnea, lower extremity edema-on day of admission-his shortness of breath worsened significantly-he was brought to the ED and required BiPAP.  He was placed on IV nitroglycerin drip-given IV Lasix and responded well.  He was subsequently admitted to the hospitalist service for further evaluation and treatment. Pt seen and examined at bedside, no new complaints at this time  Assessment & Plan:   Active Problems:   Hypertension   Type 2 diabetes mellitus with stage 2 chronic kidney disease, with long-term current use of insulin (HCC)   Acute respiratory failure with hypoxia (HCC)   Pulmonary edema   Cardiomegaly   Prolonged QT interval   Acute hypoxic respiratory failure probably secondary to pulmonary edema from decompensated heart failure. He was initially on BiPAP, transition to oxygen.   This morning he was on 5 L of nasal cannula oxygen as his sats have been in the low 80s.  But patient denies any shortness of breath or chest pain at this time.  We will repeat a chest x-ray.    Acute systolic heart failure/ischemic cardiomyopathy Patient was started on IV Lasix, increased Lasix to 40 mg twice daily, continue with spironolactone 25 mg daily, Farxiga and entresto.  Patient had 2.7 mL of urine output in the last 24 hours.  Slight bump in the creatinine from 1.26-1.35 today. Continue strict intake and output and daily weights,  cardiology on board recommends left and right heart catheterization on Monday.  Holding Entresto this evening and on the day of cardiac catheterization, to be resumed after cath    Hereditary  hemochromatosis   Essential hypertension Blood pressure parameters are well controlled    Type 2 diabetes mellitus, insulin-dependent with stage 2 cKD. CBG (last 3)  Recent Labs    03/27/21 1626 03/27/21 2128 03/28/21 0747  GLUCAP 182* 159* 117*   Uncontrolled with hyperglycemia.  Continue with novolog 70/30 15 unitd tid and SSI.  No changes in medications   Prolonged Qt interval:  Replace potassium. And keep it greater than 4. And magnesium >2.  Check magnesium levels.     Hyperlipidemia:  Resume lipitor.   Hypokalemia  Replaced recheck in the morning  Hospital delirium, Reorientation encouraged.    BPH:  Continue with flomax.     DVT prophylaxis: (Lovenox) Code Status: (Full code) Family Communication: daughter at bedside.  Disposition:   Status is: Inpatient  Remains inpatient appropriate because:Ongoing diagnostic testing needed not appropriate for outpatient work up and Unsafe d/c plan   Dispo: The patient is from: Home              Anticipated d/c is to: Home              Patient currently is not medically stable to d/c.   Difficult to place patient No       Consultants:   Cardiology.    Procedures: none.    Antimicrobials:none.    Subjective: Patient denies chest pain shortness of breath, nausea vomiting or abdominal pain  Objective: Vitals:   03/27/21 1622 03/27/21 2103 03/28/21 0525 03/28/21 0816  BP: 122/64 (!) 141/78 133/78 128/76  Pulse: (!) 58 (!)  58 (!) 58 70  Resp: 16 18 16 16   Temp: 98.3 F (36.8 C) 98.3 F (36.8 C) 98.1 F (36.7 C) 98.3 F (36.8 C)  TempSrc: Oral Oral Oral Oral  SpO2: 96% 95% 98% 93%  Weight:   98.1 kg     Intake/Output Summary (Last 24 hours) at 03/28/2021 1044 Last data filed at 03/28/2021 0093 Gross per 24 hour  Intake 1866 ml  Output 2550 ml  Net -684 ml   Filed Weights   03/26/21 0203 03/27/21 0341 03/28/21 0525  Weight: 107.7 kg 100 kg 98.1 kg    Examination:  General exam:  Well-developed elderly gentleman, not in any kind of distress Respiratory system: Diminished air entry at bases, no wheezing or rhonchi on 5 L of nasal cannula oxygen Cardiovascular system: S1-S2 heard, regular rate rhythm, no JVD, no pedal edema Gastrointestinal system: Abdomen is soft nontender nondistended bowel sounds normal Central nervous system: Alert and oriented, grossly nonfocal Extremities: No pedal edema Skin: No rashes seen Psychiatry: mood is appropriate.    Data Reviewed: I have personally reviewed following labs and imaging studies  CBC: Recent Labs  Lab 03/25/21 2206 03/25/21 2227 03/26/21 0326  WBC 10.4  --  9.1  NEUTROABS 8.0*  --   --   HGB 13.5 12.6* 13.2  HCT 38.4* 37.0* 36.9*  MCV 91.6  --  89.8  PLT 172  --  818    Basic Metabolic Panel: Recent Labs  Lab 03/25/21 2206 03/25/21 2227 03/26/21 0728 03/27/21 0331 03/27/21 1500 03/28/21 0235  NA 137 139 139 139  --  139  K 3.9 3.6 3.4* 3.4*  --  3.0*  CL 103  --  103 102  --  101  CO2 27  --  27 28  --  29  GLUCOSE 280*  --  180* 130*  --  71  BUN 20  --  18 20  --  26*  CREATININE 1.24  --  1.16 1.26*  --  1.35*  CALCIUM 8.9  --  8.8* 8.9  --  9.0  MG  --   --   --   --  2.2 2.6*    GFR: Estimated Creatinine Clearance: 51.7 mL/min (A) (by C-G formula based on SCr of 1.35 mg/dL (H)).  Liver Function Tests: Recent Labs  Lab 03/25/21 2206  AST 20  ALT 28  ALKPHOS 33*  BILITOT 1.1  PROT 6.4*  ALBUMIN 3.9    CBG: Recent Labs  Lab 03/27/21 0802 03/27/21 1212 03/27/21 1626 03/27/21 2128 03/28/21 0747  GLUCAP 184* 301* 182* 159* 117*     Recent Results (from the past 240 hour(s))  SARS CORONAVIRUS 2 (TAT 6-24 HRS) Nasopharyngeal Nasopharyngeal Swab     Status: None   Collection Time: 03/25/21  9:48 PM   Specimen: Nasopharyngeal Swab  Result Value Ref Range Status   SARS Coronavirus 2 NEGATIVE NEGATIVE Final    Comment: (NOTE) SARS-CoV-2 target nucleic acids are NOT  DETECTED.  The SARS-CoV-2 RNA is generally detectable in upper and lower respiratory specimens during the acute phase of infection. Negative results do not preclude SARS-CoV-2 infection, do not rule out co-infections with other pathogens, and should not be used as the sole basis for treatment or other patient management decisions. Negative results must be combined with clinical observations, patient history, and epidemiological information. The expected result is Negative.  Fact Sheet for Patients: SugarRoll.be  Fact Sheet for Healthcare Providers: https://www.woods-mathews.com/  This test is not yet  approved or cleared by the [place] and  has been authorized for detection and/or diagnosis of SARS-CoV-2 by FDA under an Emergency Use Authorization (EUA). This EUA will remain  in effect (meaning this test can be used) for the duration of the COVID-19 declaration under Se ction 564(b)(1) of the Act, 21 U.S.C. section 360bbb-3(b)(1), unless the authorization is terminated or revoked sooner.  Performed at [facility], [place] [address] [place], [place] [address]          Radiology Studies: ECHOCARDIOGRAM COMPLETE  Result Date: 03/26/2021    ECHOCARDIOGRAM REPORT   Patient Name:   JAELAN RASHEED Date of Exam: 03/26/2021 Medical Rec #:  [id number]      Height:       65.0 in Accession #:    [id number]     Weight:       237.4 lb Date of Birth:  Feb 27, 1946      BSA:          2.128 m Patient Age:    36 years       BP:           124/72 mmHg Patient Gender: M              HR:           66 bpm. Exam Location:  Inpatient Procedure: 2D Echo Indications:    dyspnea  History:        Patient has prior history of Echocardiogram examinations, most                 recent 01/08/2010. Cardiomegaly; Risk Factors:Hypertension,                 Diabetes and Sleep Apnea.  Sonographer:    Johny Chess Referring Phys: [id number] [place]   1. Left ventricular ejection fraction, by estimation, is 30 to 35%. The left ventricle has moderately decreased function. The left ventricle demonstrates regional wall motion abnormalities (see scoring diagram/findings for description). The mid-to-apical inferoseptal, mid-to-apical anteroseptal, mid-to-apical inferior, and apex appear severely hypokinetic. The left ventricular internal cavity size was mildly dilated. There is moderate asymmetric hypertrophy of the basal septum (measures 1.5cm). The rest of the LV segments demonstrate mild left ventricular hypertrophy. Left ventricular diastolic parameters are consistent with Grade II diastolic dysfunction (pseudonormalization). Elevated left atrial pressure.  2. Right ventricular systolic function is normal. The right ventricular size is normal. Tricuspid regurgitation signal is inadequate for assessing PA pressure.  3. The mitral valve is grossly normal. Trivial mitral valve regurgitation. No evidence of mitral stenosis.  4. The aortic valve is tricuspid. There is mild thickening of the aortic valve. Aortic valve regurgitation is trivial. Mild aortic valve sclerosis is present, with no evidence of aortic valve stenosis. Comparison(s): Compared to prior echo report in 2011, the LVEF is now moderately depressed at 30-35% with wall motion abnormalities as described above. FINDINGS  Left Ventricle: Left ventricular ejection fraction, by estimation, is 30 to 35%. The left ventricle has moderately decreased function. The left ventricle demonstrates regional wall motion abnormalities. The mid-to-apical inferoseptal, mid-to-apical anteroseptal, mid-to-apical inferior, and apex appear severely hypokinetic. The left ventricular internal cavity size was mildly dilated. There is moderate asymmetric hypertrophy of the basal septum (measures 1.5cm). The rest of the LV segments demonstrate mild left ventricular hypertrophy. Left ventricular diastolic parameters are consistent  with Grade II diastolic dysfunction (pseudonormalization). Elevated left atrial pressure. Right Ventricle: The right ventricular size is normal. No  increase in right ventricular wall thickness. Right ventricular systolic function is normal. Tricuspid regurgitation signal is inadequate for assessing PA pressure. Left Atrium: Left atrial size was normal in size. Right Atrium: Right atrial size was normal in size. Pericardium: There is no evidence of pericardial effusion. Mitral Valve: The mitral valve is grossly normal. There is mild thickening of the mitral valve leaflet(s). There is mild calcification of the mitral valve leaflet(s). Mild to moderate mitral annular calcification. Trivial mitral valve regurgitation. No evidence of mitral valve stenosis. Tricuspid Valve: The tricuspid valve is normal in structure. Tricuspid valve regurgitation is trivial. Aortic Valve: The aortic valve is tricuspid. There is mild thickening of the aortic valve. Aortic valve regurgitation is trivial. Mild aortic valve sclerosis is present, with no evidence of aortic valve stenosis. Pulmonic Valve: The pulmonic valve was normal in structure. Pulmonic valve regurgitation is trivial. Aorta: The aortic root and ascending aorta are structurally normal, with no evidence of dilitation. Venous: The inferior vena cava was not well visualized. IAS/Shunts: No atrial level shunt detected by color flow Doppler.  LEFT VENTRICLE PLAX 2D LVIDd:         6.30 cm      Diastology LVIDs:         4.70 cm      LV e' medial:    4.90 cm/s LV PW:         1.10 cm      LV E/e' medial:  20.1 LV IVS:        1.50 cm      LV e' lateral:   5.11 cm/s LVOT diam:     2.00 cm      LV E/e' lateral: 19.3 LV SV:         67 LV SV Index:   31 LVOT Area:     3.14 cm  LV Volumes (MOD) LV vol d, MOD A2C: 148.0 ml LV vol d, MOD A4C: 151.0 ml LV vol s, MOD A2C: 91.7 ml LV vol s, MOD A4C: 106.0 ml LV SV MOD A2C:     56.3 ml LV SV MOD A4C:     151.0 ml LV SV MOD BP:      53.5 ml  RIGHT VENTRICLE RV S prime:     14.10 cm/s TAPSE (M-mode): 2.1 cm LEFT ATRIUM             Index       RIGHT ATRIUM           Index LA diam:        4.80 cm 2.26 cm/m  RA Area:     14.60 cm LA Vol (A2C):   57.1 ml 26.84 ml/m RA Volume:   35.60 ml  16.73 ml/m LA Vol (A4C):   71.8 ml 33.74 ml/m LA Biplane Vol: 69.1 ml 32.48 ml/m  AORTIC VALVE LVOT Vmax:   94.90 cm/s LVOT Vmean:  60.600 cm/s LVOT VTI:    0.213 m  AORTA Ao Root diam: 3.20 cm Ao Asc diam:  2.90 cm MITRAL VALVE MV Area (PHT): 3.99 cm     SHUNTS MV Decel Time: 190 msec     Systemic VTI:  0.21 m MV E velocity: 98.50 cm/s   Systemic Diam: 2.00 cm MV A velocity: 108.00 cm/s MV E/A ratio:  0.91 Gwyndolyn Kaufman MD Electronically signed by Gwyndolyn Kaufman MD Signature Date/Time: 03/26/2021/4:21:57 PM    Final         Scheduled Meds: . allopurinol  300 mg Oral  Daily  . amLODipine  10 mg Oral Daily  . aspirin EC  81 mg Oral Daily  . atorvastatin  20 mg Oral Daily  . bisoprolol  10 mg Oral Daily  . dapagliflozin propanediol  10 mg Oral Daily  . enoxaparin (LOVENOX) injection  40 mg Subcutaneous Daily  . famotidine  40 mg Oral Daily  . furosemide  40 mg Intravenous BID  . insulin aspart  0-15 Units Subcutaneous TID WC  . insulin aspart  0-5 Units Subcutaneous QHS  . insulin aspart protamine- aspart  15 Units Subcutaneous TID WC  . magnesium oxide  400 mg Oral Daily  . potassium chloride  40 mEq Oral BID  . sacubitril-valsartan  1 tablet Oral BID  . sodium chloride flush  3 mL Intravenous Q12H  . spironolactone  25 mg Oral Daily  . tamsulosin  0.4 mg Oral QHS   Continuous Infusions: . sodium chloride    . nitroGLYCERIN Stopped (03/26/21 0925)     LOS: 2 days        Hosie Poisson, MD [facility]   To contact the attending provider between 7A-7P or the covering provider during after hours 7P-7A, please log into the web site [email or web address] and access using universal [address] password for that web site. If you do  not have the password, please call the hospital operator.  03/28/2021, 10:44 AM

## 2021-03-28 NOTE — Progress Notes (Addendum)
Subjective:  Breathing improved. Episode of O2 sat 70s this morning reported, but patient denies any acute worsening of shortness of breath  Objective:  Vital Signs in the last 24 hours: Temp:  [98 F (36.7 C)-98.3 F (36.8 C)] 98.3 F (36.8 C) (06/05 0816) Pulse Rate:  [58-70] 70 (06/05 0816) Resp:  [16-18] 16 (06/05 0816) BP: (122-141)/(64-78) 128/76 (06/05 0816) SpO2:  [93 %-98 %] 93 % (06/05 0816) Weight:  [98.1 kg] 98.1 kg (06/05 0525)  Intake/Output from previous day: 06/04 0701 - 06/05 0700 In: 1530 [P.O.:1530] Out: 2750 [Urine:2750]  Physical Exam Vitals and nursing note reviewed.  Constitutional:      General: He is not in acute distress.    Appearance: He is well-developed.  HENT:     Head: Normocephalic and atraumatic.  Eyes:     Conjunctiva/sclera: Conjunctivae normal.     Pupils: Pupils are equal, round, and reactive to light.  Neck:     Vascular: No JVD.  Cardiovascular:     Rate and Rhythm: Normal rate and regular rhythm.     Pulses: Normal pulses and intact distal pulses.     Heart sounds: No murmur heard.   Pulmonary:     Effort: Pulmonary effort is normal.     Breath sounds: Normal breath sounds. No wheezing or rales.  Abdominal:     General: Bowel sounds are normal.     Palpations: Abdomen is soft.     Tenderness: There is no rebound.  Musculoskeletal:        General: No tenderness. Normal range of motion.     Right lower leg: No edema.     Left lower leg: No edema.  Lymphadenopathy:     Cervical: No cervical adenopathy.  Skin:    General: Skin is warm and dry.  Neurological:     Mental Status: He is alert and oriented to person, place, and time.     Cranial Nerves: No cranial nerve deficit.      Lab Results: BMP Recent Labs    04/02/20 1021 07/06/20 1429 09/16/20 1127 03/25/21 2206 03/26/21 0728 03/27/21 0331 03/28/21 0235  NA 139 138 138   < > 139 139 139  K 4.2 3.9 4.0   < > 3.4* 3.4* 3.0*  CL 103 100 100   < > 103 102  101  CO2 28 29 23    < > 27 28 29   GLUCOSE 156* 143* 285*   < > 180* 130* 71  BUN 17 27* 20   < > 18 20 26*  CREATININE 1.04 1.15 1.06   < > 1.16 1.26* 1.35*  CALCIUM 9.5 9.5 9.7   < > 8.8* 8.9 9.0  GFRNONAA 71 63 69   < > >60 60* 55*  GFRAA 82 73 80  --   --   --   --    < > = values in this interval not displayed.    CBC Recent Labs  Lab 03/25/21 2206 03/25/21 2227 03/26/21 0326  WBC 10.4  --  9.1  RBC 4.19*  --  4.11*  HGB 13.5   < > 13.2  HCT 38.4*   < > 36.9*  PLT 172  --  184  MCV 91.6  --  89.8  MCH 32.2  --  32.1  MCHC 35.2  --  35.8  RDW 13.7  --  13.6  LYMPHSABS 1.2  --   --   MONOABS 0.9  --   --  EOSABS 0.2  --   --   BASOSABS 0.0  --   --    < > = values in this interval not displayed.    HEMOGLOBIN A1C Lab Results  Component Value Date   HGBA1C 7.6 (H) 03/26/2021   MPG 171 03/26/2021    Cardiac Panel (last 3 results) No results for input(s): CKTOTAL, CKMB, TROPONINI, RELINDX in the last 8760 hours.  BNP (last 3 results) Recent Labs    03/26/21 1830 03/27/21 0331 03/28/21 0235  BNP 193.5* 107.5* 65.0    TSH Recent Labs    07/06/20 1429 09/16/20 1127 03/26/21 1811  TSH 2.91 4.820* 3.764    Lipid Panel     Component Value Date/Time   CHOL 111 03/26/2021 0326   TRIG 42 03/26/2021 0326   HDL 39 (L) 03/26/2021 0326   CHOLHDL 2.8 03/26/2021 0326   VLDL 8 03/26/2021 0326   LDLCALC 64 03/26/2021 0326   LDLCALC 56 07/06/2020 1429     Hepatic Function Panel Recent Labs    07/06/20 1429 09/16/20 1127 03/25/21 2206  PROT 6.5 6.8 6.4*  ALBUMIN  --  4.5 3.9  AST 20 17 20   ALT 29 27 28   ALKPHOS  --  54 33*  BILITOT 0.6 0.7 1.1    Imaging: Chest x-ray 03/25/2021: PORTABLE CHEST 1 VIEW  COMPARISON:  Chest radiograph 09/11/2014  FINDINGS: The heart is enlarged. Mild to moderate pulmonary edema. Trace fluid in the right minor fissure. No large subpulmonic effusion. Bibasilar atelectasis without confluent airspace disease. No  pneumothorax. No acute osseous abnormalities are seen.  IMPRESSION: CHF.  Cardiomegaly with pulmonary edema and fluid in the fissures.    Cardiac Studies:  EKG 03/25/2021: Sinus rhythm 85 bpm Left bundle branch block  Echocardiogram 03/26/2021: 1. Left ventricular ejection fraction, by estimation, is 30 to 35%. The  left ventricle has moderately decreased function. The left ventricle  demonstrates regional wall motion abnormalities (see scoring  diagram/findings for description). The  mid-to-apical inferoseptal, mid-to-apical anteroseptal, mid-to-apical  inferior, and apex appear severely hypokinetic. The left ventricular  internal cavity size was mildly dilated. There is moderate asymmetric  hypertrophy of the basal septum (measures  1.5cm). The rest of the LV segments demonstrate mild left ventricular  hypertrophy. Left ventricular diastolic parameters are consistent with  Grade II diastolic dysfunction (pseudonormalization). Elevated left atrial  pressure.  2. Right ventricular systolic function is normal. The right ventricular  size is normal. Tricuspid regurgitation signal is inadequate for assessing  PA pressure.  3. The mitral valve is grossly normal. Trivial mitral valve  regurgitation. No evidence of mitral stenosis.  4. The aortic valve is tricuspid. There is mild thickening of the aortic  valve. Aortic valve regurgitation is trivial. Mild aortic valve sclerosis  is present, with no evidence of aortic valve stenosis.   Comparison(s): Compared to prior echo report in 2011, the LVEF is now  moderately depressed at 30-35% with wall motion abnormalities as described  above.   Assessment & Recommendations:  75 year old Caucasian male with hereditary hemochromatosis, hypertension, type 2 diabetes mellitus, untreated OSA, former >40-pack-year smoker, with new diagnosis of acute HFrEF  HFrEF: Acute, new diagnosis, NYHA class IV Acute pulmonary edema resolved with  diuresis and BiPAP support. Ischemic cardiomyopathy high on differential. Currently on bisoprolol 10 mg daily, Farxiga 10 mg daily, IV Lasix 40 mg daily, Entresto 49-51 mg twice daily, spironolactone 25 mg daily.  Currently on K supplement. I expect this may not be needed long term,  given ongoing use of spironolactone and Entresto. Diuresing well with 2.7 L output/24 hours.  Net -ve 2.3 L. Volume status remarkably improved, appears euvolumic today. Discontinue IV lasix. Hold Entresto until after cath. Cr 1.26-->1.35, expect this will improve. We will plan on performing right and left heart catheterization, coronary angiography and possible intervention on Monday, 03/29/2021.   If no obstructive CAD noted, differentials should include cardiac hemochromatosis.  Anasarca: HFrEF remains likely etiology.  However, given his history of hemochromatosis, consider evaluation with liver ultrasound.  Troponin elevation: Type II MI in the setting of acute heart failure   Nigel Mormon, MD Pager: [phone] Office: [phone]

## 2021-03-28 NOTE — H&P (View-Only) (Signed)
Subjective:  Breathing improved. Episode of O2 sat 70s this morning reported, but patient denies any acute worsening of shortness of breath  Objective:  Vital Signs in the last 24 hours: Temp:  [98 F (36.7 C)-98.3 F (36.8 C)] 98.3 F (36.8 C) (06/05 0816) Pulse Rate:  [58-70] 70 (06/05 0816) Resp:  [16-18] 16 (06/05 0816) BP: (122-141)/(64-78) 128/76 (06/05 0816) SpO2:  [93 %-98 %] 93 % (06/05 0816) Weight:  [98.1 kg] 98.1 kg (06/05 0525)  Intake/Output from previous day: 06/04 0701 - 06/05 0700 In: 1530 [P.O.:1530] Out: 2750 [Urine:2750]  Physical Exam Vitals and nursing note reviewed.  Constitutional:      General: He is not in acute distress.    Appearance: He is well-developed.  HENT:     Head: Normocephalic and atraumatic.  Eyes:     Conjunctiva/sclera: Conjunctivae normal.     Pupils: Pupils are equal, round, and reactive to light.  Neck:     Vascular: No JVD.  Cardiovascular:     Rate and Rhythm: Normal rate and regular rhythm.     Pulses: Normal pulses and intact distal pulses.     Heart sounds: No murmur heard.   Pulmonary:     Effort: Pulmonary effort is normal.     Breath sounds: Normal breath sounds. No wheezing or rales.  Abdominal:     General: Bowel sounds are normal.     Palpations: Abdomen is soft.     Tenderness: There is no rebound.  Musculoskeletal:        General: No tenderness. Normal range of motion.     Right lower leg: No edema.     Left lower leg: No edema.  Lymphadenopathy:     Cervical: No cervical adenopathy.  Skin:    General: Skin is warm and dry.  Neurological:     Mental Status: He is alert and oriented to person, place, and time.     Cranial Nerves: No cranial nerve deficit.      Lab Results: BMP Recent Labs    04/02/20 1021 07/06/20 1429 09/16/20 1127 03/25/21 2206 03/26/21 0728 03/27/21 0331 03/28/21 0235  NA 139 138 138   < > 139 139 139  K 4.2 3.9 4.0   < > 3.4* 3.4* 3.0*  CL 103 100 100   < > 103 102  101  CO2 28 29 23    < > 27 28 29   GLUCOSE 156* 143* 285*   < > 180* 130* 71  BUN 17 27* 20   < > 18 20 26*  CREATININE 1.04 1.15 1.06   < > 1.16 1.26* 1.35*  CALCIUM 9.5 9.5 9.7   < > 8.8* 8.9 9.0  GFRNONAA 71 63 69   < > >60 60* 55*  GFRAA 82 73 80  --   --   --   --    < > = values in this interval not displayed.    CBC Recent Labs  Lab 03/25/21 2206 03/25/21 2227 03/26/21 0326  WBC 10.4  --  9.1  RBC 4.19*  --  4.11*  HGB 13.5   < > 13.2  HCT 38.4*   < > 36.9*  PLT 172  --  184  MCV 91.6  --  89.8  MCH 32.2  --  32.1  MCHC 35.2  --  35.8  RDW 13.7  --  13.6  LYMPHSABS 1.2  --   --   MONOABS 0.9  --   --  EOSABS 0.2  --   --   BASOSABS 0.0  --   --    < > = values in this interval not displayed.    HEMOGLOBIN A1C Lab Results  Component Value Date   HGBA1C 7.6 (H) 03/26/2021   MPG 171 03/26/2021    Cardiac Panel (last 3 results) No results for input(s): CKTOTAL, CKMB, TROPONINI, RELINDX in the last 8760 hours.  BNP (last 3 results) Recent Labs    03/26/21 1830 03/27/21 0331 03/28/21 0235  BNP 193.5* 107.5* 65.0    TSH Recent Labs    07/06/20 1429 09/16/20 1127 03/26/21 1811  TSH 2.91 4.820* 3.764    Lipid Panel     Component Value Date/Time   CHOL 111 03/26/2021 0326   TRIG 42 03/26/2021 0326   HDL 39 (L) 03/26/2021 0326   CHOLHDL 2.8 03/26/2021 0326   VLDL 8 03/26/2021 0326   LDLCALC 64 03/26/2021 0326   LDLCALC 56 07/06/2020 1429     Hepatic Function Panel Recent Labs    07/06/20 1429 09/16/20 1127 03/25/21 2206  PROT 6.5 6.8 6.4*  ALBUMIN  --  4.5 3.9  AST 20 17 20   ALT 29 27 28   ALKPHOS  --  54 33*  BILITOT 0.6 0.7 1.1    Imaging: Chest x-ray 03/25/2021: PORTABLE CHEST 1 VIEW  COMPARISON:  Chest radiograph 09/11/2014  FINDINGS: The heart is enlarged. Mild to moderate pulmonary edema. Trace fluid in the right minor fissure. No large subpulmonic effusion. Bibasilar atelectasis without confluent airspace disease. No  pneumothorax. No acute osseous abnormalities are seen.  IMPRESSION: CHF.  Cardiomegaly with pulmonary edema and fluid in the fissures.    Cardiac Studies:  EKG 03/25/2021: Sinus rhythm 85 bpm Left bundle branch block  Echocardiogram 03/26/2021: 1. Left ventricular ejection fraction, by estimation, is 30 to 35%. The  left ventricle has moderately decreased function. The left ventricle  demonstrates regional wall motion abnormalities (see scoring  diagram/findings for description). The  mid-to-apical inferoseptal, mid-to-apical anteroseptal, mid-to-apical  inferior, and apex appear severely hypokinetic. The left ventricular  internal cavity size was mildly dilated. There is moderate asymmetric  hypertrophy of the basal septum (measures  1.5cm). The rest of the LV segments demonstrate mild left ventricular  hypertrophy. Left ventricular diastolic parameters are consistent with  Grade II diastolic dysfunction (pseudonormalization). Elevated left atrial  pressure.  2. Right ventricular systolic function is normal. The right ventricular  size is normal. Tricuspid regurgitation signal is inadequate for assessing  PA pressure.  3. The mitral valve is grossly normal. Trivial mitral valve  regurgitation. No evidence of mitral stenosis.  4. The aortic valve is tricuspid. There is mild thickening of the aortic  valve. Aortic valve regurgitation is trivial. Mild aortic valve sclerosis  is present, with no evidence of aortic valve stenosis.   Comparison(s): Compared to prior echo report in 2011, the LVEF is now  moderately depressed at 30-35% with wall motion abnormalities as described  above.   Assessment & Recommendations:  75 year old Caucasian male with hereditary hemochromatosis, hypertension, type 2 diabetes mellitus, untreated OSA, former >40-pack-year smoker, with new diagnosis of acute HFrEF  HFrEF: Acute, new diagnosis, NYHA class IV Acute pulmonary edema resolved with  diuresis and BiPAP support. Ischemic cardiomyopathy high on differential. Currently on bisoprolol 10 mg daily, Farxiga 10 mg daily, IV Lasix 40 mg daily, Entresto 49-51 mg twice daily, spironolactone 25 mg daily.  Currently on K supplement. I expect this may not be needed long term,  given ongoing use of spironolactone and Entresto. Diuresing well with 2.7 L output/24 hours.  Net -ve 2.3 L. Volume status remarkably improved, appears euvolumic today. Discontinue IV lasix. Hold Entresto until after cath. Cr 1.26-->1.35, expect this will improve. We will plan on performing right and left heart catheterization, coronary angiography and possible intervention on Monday, 03/29/2021.   If no obstructive CAD noted, differentials should include cardiac hemochromatosis.  Anasarca: HFrEF remains likely etiology.  However, given his history of hemochromatosis, consider evaluation with liver ultrasound.  Troponin elevation: Type II MI in the setting of acute heart failure   Nigel Mormon, MD Pager: [phone] Office: [phone]

## 2021-03-29 ENCOUNTER — Encounter (HOSPITAL_COMMUNITY): Payer: Self-pay | Admitting: Cardiology

## 2021-03-29 ENCOUNTER — Encounter (HOSPITAL_COMMUNITY)
Admission: EM | Disposition: A | Discharge status: Home / Self Care | Payer: Self-pay | Source: Home / Self Care | Attending: Internal Medicine

## 2021-03-29 DIAGNOSIS — I5021 Acute systolic (congestive) heart failure: Secondary | ICD-10-CM

## 2021-03-29 DIAGNOSIS — I447 Left bundle-branch block, unspecified: Secondary | ICD-10-CM

## 2021-03-29 DIAGNOSIS — R778 Other specified abnormalities of plasma proteins: Secondary | ICD-10-CM

## 2021-03-29 DIAGNOSIS — I429 Cardiomyopathy, unspecified: Secondary | ICD-10-CM

## 2021-03-29 DIAGNOSIS — R7989 Other specified abnormal findings of blood chemistry: Secondary | ICD-10-CM

## 2021-03-29 HISTORY — PX: RIGHT/LEFT HEART CATH AND CORONARY ANGIOGRAPHY: CATH118266

## 2021-03-29 LAB — CBC WITH DIFFERENTIAL/PLATELET
Abs Immature Granulocytes: 0.03 10*3/uL (ref 0.00–0.07)
Basophils Absolute: 0 10*3/uL (ref 0.0–0.1)
Basophils Relative: 1 %
Eosinophils Absolute: 0.2 10*3/uL (ref 0.0–0.5)
Eosinophils Relative: 3 %
HCT: 43.1 % (ref 39.0–52.0)
Hemoglobin: 15.5 g/dL (ref 13.0–17.0)
Immature Granulocytes: 0 %
Lymphocytes Relative: 25 %
Lymphs Abs: 1.9 10*3/uL (ref 0.7–4.0)
MCH: 32 pg (ref 26.0–34.0)
MCHC: 36 g/dL (ref 30.0–36.0)
MCV: 89 fL (ref 80.0–100.0)
Monocytes Absolute: 0.8 10*3/uL (ref 0.1–1.0)
Monocytes Relative: 11 %
Neutro Abs: 4.5 10*3/uL (ref 1.7–7.7)
Neutrophils Relative %: 60 %
Platelets: 202 10*3/uL (ref 150–400)
RBC: 4.84 MIL/uL (ref 4.22–5.81)
RDW: 13.4 % (ref 11.5–15.5)
WBC: 7.5 10*3/uL (ref 4.0–10.5)
nRBC: 0 % (ref 0.0–0.2)

## 2021-03-29 LAB — POCT I-STAT EG7
Acid-Base Excess: 2 mmol/L (ref 0.0–2.0)
Bicarbonate: 28.6 mmol/L — ABNORMAL HIGH (ref 20.0–28.0)
Calcium, Ion: 1.21 mmol/L (ref 1.15–1.40)
HCT: 41 % (ref 39.0–52.0)
Hemoglobin: 13.9 g/dL (ref 13.0–17.0)
O2 Saturation: 70 %
Potassium: 3.9 mmol/L (ref 3.5–5.1)
Sodium: 142 mmol/L (ref 135–145)
TCO2: 30 mmol/L (ref 22–32)
pCO2, Ven: 50.8 mmHg (ref 44.0–60.0)
pH, Ven: 7.359 (ref 7.250–7.430)
pO2, Ven: 39 mmHg (ref 32.0–45.0)

## 2021-03-29 LAB — GLUCOSE, CAPILLARY
Glucose-Capillary: 145 mg/dL — ABNORMAL HIGH (ref 70–99)
Glucose-Capillary: 95 mg/dL (ref 70–99)
Glucose-Capillary: 103 mg/dL — ABNORMAL HIGH (ref 70–99)
Glucose-Capillary: 160 mg/dL — ABNORMAL HIGH (ref 70–99)

## 2021-03-29 LAB — POCT I-STAT 7, (LYTES, BLD GAS, ICA,H+H)
Acid-Base Excess: 2 mmol/L (ref 0.0–2.0)
Bicarbonate: 27.8 mmol/L (ref 20.0–28.0)
Calcium, Ion: 1.19 mmol/L (ref 1.15–1.40)
HCT: 40 % (ref 39.0–52.0)
Hemoglobin: 13.6 g/dL (ref 13.0–17.0)
O2 Saturation: 100 %
Potassium: 3.9 mmol/L (ref 3.5–5.1)
Sodium: 141 mmol/L (ref 135–145)
TCO2: 29 mmol/L (ref 22–32)
pCO2 arterial: 45.1 mmHg (ref 32.0–48.0)
pH, Arterial: 7.397 (ref 7.350–7.450)
pO2, Arterial: 181 mmHg — ABNORMAL HIGH (ref 83.0–108.0)

## 2021-03-29 LAB — BASIC METABOLIC PANEL
Anion gap: 9 (ref 5–15)
BUN: 26 mg/dL — ABNORMAL HIGH (ref 8–23)
CO2: 25 mmol/L (ref 22–32)
Calcium: 9.4 mg/dL (ref 8.9–10.3)
Chloride: 105 mmol/L (ref 98–111)
Creatinine, Ser: 1.3 mg/dL — ABNORMAL HIGH (ref 0.61–1.24)
GFR, Estimated: 58 mL/min — ABNORMAL LOW (ref >60)
Glucose, Bld: 142 mg/dL — ABNORMAL HIGH (ref 70–99)
Potassium: 4 mmol/L (ref 3.5–5.1)
Sodium: 139 mmol/L (ref 135–145)

## 2021-03-29 SURGERY — RIGHT/LEFT HEART CATH AND CORONARY ANGIOGRAPHY
Anesthesia: LOCAL

## 2021-03-29 MED ORDER — SODIUM CHLORIDE 0.9 % IV SOLN: 250.0000 mL | INTRAVENOUS | Status: DC | PRN

## 2021-03-29 MED ORDER — HEPARIN (PORCINE) IN NACL 1000-0.9 UT/500ML-% IV SOLN
INTRAVENOUS | Status: AC
  Filled 2021-03-29: qty 1000

## 2021-03-29 MED ORDER — ACETAMINOPHEN 325 MG PO TABS: 650.0000 mg | ORAL_TABLET | ORAL | Status: DC | PRN

## 2021-03-29 MED ORDER — VERAPAMIL HCL 2.5 MG/ML IV SOLN
INTRAVENOUS | Status: AC
  Filled 2021-03-29: qty 2

## 2021-03-29 MED ORDER — VERAPAMIL HCL 2.5 MG/ML IV SOLN
INTRAVENOUS | Status: DC | PRN
  Administered 2021-03-29: 10 mL via INTRA_ARTERIAL

## 2021-03-29 MED ORDER — MIDAZOLAM HCL 2 MG/2ML IJ SOLN
INTRAMUSCULAR | Status: AC
  Filled 2021-03-29: qty 2

## 2021-03-29 MED ORDER — FENTANYL CITRATE (PF) 100 MCG/2ML IJ SOLN
INTRAMUSCULAR | Status: DC | PRN
  Administered 2021-03-29: 25 ug via INTRAVENOUS

## 2021-03-29 MED ORDER — LIDOCAINE HCL (PF) 1 % IJ SOLN
INTRAMUSCULAR | Status: DC | PRN
  Administered 2021-03-29 (×2): 2 mL

## 2021-03-29 MED ORDER — SACUBITRIL-VALSARTAN 49-51 MG PO TABS
1.0000 | ORAL_TABLET | Freq: Two times a day (BID) | ORAL | Status: DC
  Administered 2021-03-30 – 2021-03-31 (×3): 1 via ORAL
  Filled 2021-03-29 (×5): qty 1

## 2021-03-29 MED ORDER — SODIUM CHLORIDE 0.9% FLUSH: 3.0000 mL | INTRAVENOUS | Status: DC | PRN

## 2021-03-29 MED ORDER — HEPARIN SODIUM (PORCINE) 1000 UNIT/ML IJ SOLN
INTRAMUSCULAR | Status: AC
  Filled 2021-03-29: qty 1

## 2021-03-29 MED ORDER — HYDRALAZINE HCL 20 MG/ML IJ SOLN: 10.0000 mg | INTRAMUSCULAR | Status: AC | PRN

## 2021-03-29 MED ORDER — LABETALOL HCL 5 MG/ML IV SOLN: 10.0000 mg | INTRAVENOUS | Status: AC | PRN

## 2021-03-29 MED ORDER — FENTANYL CITRATE (PF) 100 MCG/2ML IJ SOLN
INTRAMUSCULAR | Status: AC
  Filled 2021-03-29: qty 2

## 2021-03-29 MED ORDER — LIDOCAINE HCL (PF) 1 % IJ SOLN
INTRAMUSCULAR | Status: AC
  Filled 2021-03-29: qty 30

## 2021-03-29 MED ORDER — IOHEXOL 350 MG/ML SOLN
INTRAVENOUS | Status: DC | PRN
  Administered 2021-03-29: 21 mL via INTRA_ARTERIAL

## 2021-03-29 MED ORDER — SODIUM CHLORIDE 0.9% FLUSH
3.0000 mL | Freq: Two times a day (BID) | INTRAVENOUS | Status: DC
  Administered 2021-03-29: 3 mL via INTRAVENOUS

## 2021-03-29 MED ORDER — HEPARIN (PORCINE) IN NACL 1000-0.9 UT/500ML-% IV SOLN
INTRAVENOUS | Status: DC | PRN
  Administered 2021-03-29 (×3): 500 mL

## 2021-03-29 MED ORDER — MIDAZOLAM HCL 2 MG/2ML IJ SOLN
INTRAMUSCULAR | Status: DC | PRN
  Administered 2021-03-29: 1 mg via INTRAVENOUS

## 2021-03-29 MED ORDER — HEPARIN SODIUM (PORCINE) 1000 UNIT/ML IJ SOLN
INTRAMUSCULAR | Status: DC | PRN
  Administered 2021-03-29: 5000 [IU] via INTRAVENOUS

## 2021-03-29 SURGICAL SUPPLY — 12 items
CATH 5FR JL3.5 JR4 ANG PIG MP (CATHETERS) ×1 IMPLANT
CATH SWAN GANZ 7F STRAIGHT (CATHETERS) ×1 IMPLANT
DEVICE RAD COMP TR BAND LRG (VASCULAR PRODUCTS) ×1 IMPLANT
GLIDESHEATH SLEND A-KIT 6F 22G (SHEATH) ×1 IMPLANT
GLIDESHEATH SLENDER 7FR .021G (SHEATH) ×1 IMPLANT
GUIDEWIRE .025 260CM (WIRE) ×1 IMPLANT
GUIDEWIRE INQWIRE 1.5J.035X260 (WIRE) IMPLANT
INQWIRE 1.5J .035X260CM (WIRE) ×4
KIT HEART LEFT (KITS) ×2 IMPLANT
PACK CARDIAC CATHETERIZATION (CUSTOM PROCEDURE TRAY) ×2 IMPLANT
TRANSDUCER W/STOPCOCK (MISCELLANEOUS) ×2 IMPLANT
TUBING CIL FLEX 10 FLL-RA (TUBING) ×3 IMPLANT

## 2021-03-29 NOTE — Progress Notes (Addendum)
Subjective:  Patient resting comfortably with family present at bedside. Patient denies chest pain.  Reports continued improvement of shortness of breath and swelling.  Presently on 4L/min oxygen via nasal cannula at time of exam.  Overall patient states he is feeling much improved.  Again reviewed heart catheterization procedures and addresses patient's and family's questions and concerns.   Intake/Output from previous day:  I/O last 3 completed shifts: In: 819 [P.O.:816; I.V.:3] Out: 2045 [Urine:2045] No intake/output data recorded.  Blood pressure 138/80, pulse (!) 58, temperature 98.8 F (37.1 C), temperature source Oral, resp. rate 16, weight 97.3 kg, SpO2 97 %. Physical Exam Vitals reviewed.  HENT:     Head: Normocephalic and atraumatic.  Neck:     Vascular: No carotid bruit or JVD.  Cardiovascular:     Rate and Rhythm: Normal rate and regular rhythm.     Pulses: Intact distal pulses.     Heart sounds: S1 normal and S2 normal. No murmur heard. No gallop.   Pulmonary:     Effort: Pulmonary effort is normal. No respiratory distress.     Breath sounds: No wheezing, rhonchi or rales.  Musculoskeletal:     Right lower leg: Edema (minimal) present.     Left lower leg: Edema (minimal) present.  Skin:    General: Skin is warm and dry.  Neurological:     Mental Status: He is alert.     Lab Results: BMP BNP (last 3 results) Recent Labs    03/26/21 1830 03/27/21 0331 03/28/21 0235  BNP 193.5* 107.5* 65.0    ProBNP (last 3 results) No results for input(s): PROBNP in the last 8760 hours. BMP Latest Ref Rng & Units 03/28/2021 03/27/2021 03/26/2021  Glucose 70 - 99 mg/dL 71 130(H) 180(H)  BUN 8 - 23 mg/dL 26(H) 20 18  Creatinine 0.61 - 1.24 mg/dL 1.35(H) 1.26(H) 1.16  BUN/Creat Ratio 10 - 24 - - -  Sodium 135 - 145 mmol/L 139 139 139  Potassium 3.5 - 5.1 mmol/L 3.0(L) 3.4(L) 3.4(L)  Chloride 98 - 111 mmol/L 101 102 103  CO2 22 - 32 mmol/L 29 28 27  Calcium 8.9 - 10.3  mg/dL 9.0 8.9 8.8(L)   Hepatic Function Latest Ref Rng & Units 03/25/2021 09/16/2020 07/06/2020  Total Protein 6.5 - 8.1 g/dL 6.4(L) 6.8 6.5  Albumin 3.5 - 5.0 g/dL 3.9 4.5 -  AST 15 - 41 U/L 20 17 20  ALT 0 - 44 U/L 28 27 29  Alk Phosphatase 38 - 126 U/L 33(L) 54 -  Total Bilirubin 0.3 - 1.2 mg/dL 1.1 0.7 0.6  Bilirubin, Direct 0.0 - 0.2 mg/dL - - -   CBC Latest Ref Rng & Units 03/26/2021 03/25/2021 03/25/2021  WBC 4.0 - 10.5 K/uL 9.1 - 10.4  Hemoglobin 13.0 - 17.0 g/dL 13.2 12.6(L) 13.5  Hematocrit 39.0 - 52.0 % 36.9(L) 37.0(L) 38.4(L)  Platelets 150 - 400 K/uL 184 - 172   Lipid Panel     Component Value Date/Time   CHOL 111 03/26/2021 0326   TRIG 42 03/26/2021 0326   HDL 39 (L) 03/26/2021 0326   CHOLHDL 2.8 03/26/2021 0326   VLDL 8 03/26/2021 0326   LDLCALC 64 03/26/2021 0326   LDLCALC 56 07/06/2020 1429   Cardiac Panel (last 3 results) No results for input(s): CKTOTAL, CKMB, TROPONINI, RELINDX in the last 72 hours.  HEMOGLOBIN A1C Lab Results  Component Value Date   HGBA1C 7.6 (H) 03/26/2021   MPG 171 03/26/2021   TSH Recent Labs      07/06/20 1429 09/16/20 1127 03/26/21 1811  TSH 2.91 4.820* 3.764   Imaging: Chest x-ray 03/25/2021: PORTABLE CHEST 1 VIEW COMPARISON: Chest radiograph 09/11/2014  FINDINGS: The heart is enlarged. Mild to moderate pulmonary edema. Trace fluid in the right minor fissure. No large subpulmonic effusion. Bibasilar atelectasis without confluent airspace disease. No pneumothorax. No acute osseous abnormalities are seen.  IMPRESSION: CHF. Cardiomegaly with pulmonary edema and fluid in the fissures.  Cardiac Studies: Echocardiogram 03/26/2021: 1. Left ventricular ejection fraction, by estimation, is 30 to 35%. The  left ventricle has moderately decreased function. The left ventricle  demonstrates regional wall motion abnormalities (see scoring  diagram/findings for description). The  mid-to-apical inferoseptal, mid-to-apical  anteroseptal, mid-to-apical  inferior, and apex appear severely hypokinetic. The left ventricular  internal cavity size was mildly dilated. There is moderate asymmetric  hypertrophy of the basal septum (measures  1.5cm). The rest of the LV segments demonstrate mild left ventricular  hypertrophy. Left ventricular diastolic parameters are consistent with  Grade II diastolic dysfunction (pseudonormalization). Elevated left atrial  pressure.  2. Right ventricular systolic function is normal. The right ventricular  size is normal. Tricuspid regurgitation signal is inadequate for assessing  PA pressure.  3. The mitral valve is grossly normal. Trivial mitral valve  regurgitation. No evidence of mitral stenosis.  4. The aortic valve is tricuspid. There is mild thickening of the aortic  valve. Aortic valve regurgitation is trivial. Mild aortic valve sclerosis  is present, with no evidence of aortic valve stenosis.   Comparison(s): Compared to prior echo report in 2011, the LVEF is now  moderately depressed at 30-35% with wall motion abnormalities as described  above.   EKG:  03/25/2021: Sinus rhythm 85 bpm Left bundle branch block   Scheduled Meds: . allopurinol  300 mg Oral Daily  . amLODipine  10 mg Oral Daily  . [START ON 03/30/2021] aspirin EC  81 mg Oral Daily  . atorvastatin  20 mg Oral Daily  . bisoprolol  10 mg Oral Daily  . dapagliflozin propanediol  10 mg Oral Daily  . enoxaparin (LOVENOX) injection  40 mg Subcutaneous Daily  . famotidine  40 mg Oral Daily  . insulin aspart  0-15 Units Subcutaneous TID WC  . insulin aspart  0-5 Units Subcutaneous QHS  . insulin aspart protamine- aspart  15 Units Subcutaneous TID WC  . magnesium oxide  400 mg Oral Daily  . sodium chloride flush  3 mL Intravenous Q12H  . sodium chloride flush  3 mL Intravenous Q12H  . spironolactone  25 mg Oral Daily  . tamsulosin  0.4 mg Oral QHS   Continuous Infusions: . sodium chloride    . sodium  chloride    . sodium chloride 10 mL/hr at 03/29/21 0553  . nitroGLYCERIN Stopped (03/26/21 0925)   PRN Meds:.sodium chloride, sodium chloride, acetaminophen, sodium chloride flush, sodium chloride flush  Assessment/Plan:   HFrEF: Acute, new diagnosis, NYHA class IV Acute pulmonary edema resolved with diuresis and BiPAP support. Ischemic cardiomyopathy high on differential. Will proceed with right and left heart catheterization.   Patient's potassium is low at 3.0 yesterday, continue potassium supplement  Continue bisoprolol, Farxiga, spironolactone. Have held [facility], but plan to resume following catheterization.  Patient has diuresed well with net -3.0L since admission. On exam today patient appears euvolemic.  Will continue to monitor renal function closely.  BNP trended down from 107 to 65  Anasarca: Patient is euvolemic on exam today. Volume overload likely due to underlying HFrEF.  Cannot rule out cardiac hemochromatosis given patient's history. Could consider liver ultrasound, particularly if no obstructive CAD noted on coronary angiography.   Troponin elevation: Secondary to acute heart failure.  Patient was seen in collaboration with Dr. Terri Skains. He also reviewed patient's chart and examined the patient. Dr. Terri Skains is in agreement of the plan.     Alethia Berthold, PA-C 03/29/2021, 7:42 AM Office: [phone]  Addendum:  Patient seen and examined at bedside. Accompanied by his daughter and wife. Denies any chest pain at rest or with effort related activities. Shortness of breath has significantly improved since admission.   Patient is very pleased with the weight loss and diuresis. Scheduled for left and right heart catheterization later today.  PHYSICAL EXAM: Vitals with BMI 03/29/2021 03/29/2021 03/29/2021  Height 5' 5" - -  Weight 216 lbs 8 oz - -  BMI 49.70 - -  Systolic 263 785 885  Diastolic 85 71 71  Pulse 58 58 55   CONSTITUTIONAL: Well-developed and  well-nourished. No acute distress.  SKIN: Skin is warm and dry. No rash noted. No cyanosis. No pallor. No jaundice HEAD: Normocephalic and atraumatic.  EYES: No scleral icterus MOUTH/THROAT: Moist oral membranes.  NECK: No JVD present. No thyromegaly noted. No carotid bruits  LYMPHATIC: No visible cervical adenopathy.  CHEST Normal respiratory effort. No intercostal retractions  LUNGS: Clear to auscultation bilaterally.  No stridor. No wheezes. No rales.  CARDIOVASCULAR: Regular, positive O2-D7, soft holosystolic murmur heard at the apex, no rubs or gallops appreciated ABDOMINAL: Obese, soft, nontender, nondistended, positive bowel sounds no apparent ascites.  EXTREMITIES: Trace bilateral peripheral edema  HEMATOLOGIC: No significant bruising NEUROLOGIC: Oriented to person, place, and time. Nonfocal. Normal muscle tone.  PSYCHIATRIC: Normal mood and affect. Normal behavior. Cooperative  Impression / Plan:  Acute heart failure with reduced EF: Newly diagnosed. Appears to be euvolemic. Negative net urine output since admission. Continue guideline directed medical therapy and uptitrate as hemodynamics and laboratory values allow. Scheduled for left and right heart catheterization later today. Patient's and his family's questions and concerns were addressed to their satisfaction.  Cardiomyopathy: Continue GDMT. Scheduled for heart catheterization given regional wall motion abnormalities suspect possible ischemic etiology.  Elevated troponins: Most likely secondary to supply demand ischemia. Continue to monitor  Left bundle branch block: Continue to monitor  Total time spent: 35 minutes.  Keyleen Cerrato [place], DO, [facility]  03/29/2021, 8:25 AM Pager: [phone] Office: [phone]

## 2021-03-29 NOTE — Progress Notes (Signed)
PROGRESS NOTE    Brett [name]  PYK:[id number] DOB: [birth year] DOA: 03/25/2021 PCP: Azzie Glatter, FNP (Inactive)    Chief Complaint  Patient presents with  . Respiratory Distress    Brief Narrative:  Patient is a 75 y.o. male with history of hereditary hemochromatosis, HTN, DM-2-who presented to the hospital with several days history of exertional dyspnea, lower extremity edema-on day of admission-his shortness of breath worsened significantly-he was brought to the ED and required BiPAP.  He was placed on IV nitroglycerin drip-given IV Lasix and responded well.  He was subsequently admitted to the hospitalist service for further evaluation and treatment. He was diuresed appropriately, and underwent cardiac catheterization, showing non obstructive CAD. He is scheduled for cardiac MRI for further evaluation.   Assessment & Plan:   Active Problems:   Hypertension   Type 2 diabetes mellitus with stage 2 chronic kidney disease, with long-term current use of insulin (HCC)   Acute respiratory failure with hypoxia (HCC)   Pulmonary edema   Cardiomegaly   Prolonged QT interval   HFrEF (heart failure with reduced ejection fraction) (HCC)   Acute hypoxic respiratory failure probably secondary to pulmonary edema from decompensated heart failure. He was initially on BiPAP, transition to oxygen, currently on 4 lit of [place] oxygen.repeat CXR shows improvement in the pulmonary edema.      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  Lasix discontinued. Meanwhile continue with bisoprolol 10 mg daily, Norvasc 10 mg, spironolactone 25 mg daily, Farxiga and entresto 49-51 1 tablet twice daily.  Creatinine stable around 1.3 Continue strict intake and output and daily weights,  Cardiac cath showed non obstructive CAD, and in view of his history territory hemochromatosis he is scheduled for cardiac MRI later today to  evaluate for cardiac hemochromatosis.    Hereditary hemochromatosis   Essential hypertension Blood pressure parameters are optimal    Type 2 diabetes mellitus, insulin-dependent with stage 2 cKD. CBG (last 3)  Recent Labs    03/28/21 1605 03/28/21 2112 03/29/21 0743  GLUCAP 134* 176* 145*   Uncontrolled with hyperglycemia.  Hemoglobin A1c at 7.6 Continue with novolog 70/30 15 unitd tid and SSI.  No changes in medications   Prolonged Qt interval:  Replace potassium. And keep it greater than 4. And magnesium >2.  Recheck EKG    Hyperlipidemia:  Resume lipitor.   Hypokalemia  Replaced, repeat level within normal limits  Hospital delirium, Reorientation encouraged.    BPH:  Continue with flomax.     DVT prophylaxis: (Lovenox) Code Status: (Full code) Family Communication: daughter at bedside.  Disposition:   Status is: Inpatient  Remains inpatient appropriate because:Ongoing diagnostic testing needed not appropriate for outpatient work up and Unsafe d/c plan   Dispo: The patient is from: Home              Anticipated d/c is to: Home              Patient currently is not medically stable to d/c.   Difficult to place patient No       Consultants:   Cardiology.    Procedures: Cardiac catheterization on 03/29/2021   Antimicrobials:none.    Subjective: No chest pain or shortness of breath this morning  Objective: Vitals:   03/28/21 2115 03/29/21 0519 03/29/21 0800 03/29/21 0914  BP: (!) 143/84 138/80    Pulse: 64 (!) 58    Resp: 18 16  Temp: 98.8 F (37.1 C) 98.8 F (37.1 C)    TempSrc: Oral Oral Oral   SpO2: 95% 97%  100%  Weight:  97.3 kg      Intake/Output Summary (Last 24 hours) at 03/29/2021 1117 Last data filed at 03/29/2021 0700 Gross per 24 hour  Intake 11.16 ml  Output 775 ml  Net -763.84 ml   Filed Weights   03/27/21 0341 03/28/21 0525 03/29/21 0519  Weight: 100 kg 98.1 kg 97.3 kg    Examination:  General exam:  Well-developed elderly gentleman on 4 L of nasal cannula oxygen not in any kind of distress  respiratory system: Diminished air entry at bases, no wheezing or rhonchi no tachypnea  cardiovascular system: S1-S2 heard, regular rate rhythm, no JVD no pedal edema Gastrointestinal system: Abdomen is soft nontender bowel sounds normal Central nervous system: Alert and able to answer all questions, grossly nonfocal Extremities: No cyanosis or pedal edema Skin: No rashes seen Psychiatry: Appropriate mood  Data Reviewed: I have personally reviewed following labs and imaging studies  CBC: Recent Labs  Lab 03/25/21 2206 03/25/21 2227 03/26/21 0326 03/29/21 0746  WBC 10.4  --  9.1 7.5  NEUTROABS 8.0*  --   --  4.5  HGB 13.5 12.6* 13.2 15.5  HCT 38.4* 37.0* 36.9* 43.1  MCV 91.6  --  89.8 89.0  PLT 172  --  184 449    Basic Metabolic Panel: Recent Labs  Lab 03/25/21 2206 03/25/21 2227 03/26/21 0728 03/27/21 0331 03/27/21 1500 03/28/21 0235 03/29/21 0746  NA 137 139 139 139  --  139 139  K 3.9 3.6 3.4* 3.4*  --  3.0* 4.0  CL 103  --  103 102  --  101 105  CO2 27  --  27 28  --  29 25  GLUCOSE 280*  --  180* 130*  --  71 142*  BUN 20  --  18 20  --  26* 26*  CREATININE 1.24  --  1.16 1.26*  --  1.35* 1.30*  CALCIUM 8.9  --  8.8* 8.9  --  9.0 9.4  MG  --   --   --   --  2.2 2.6*  --     GFR: Estimated Creatinine Clearance: 53.4 mL/min (A) (by C-G formula based on SCr of 1.3 mg/dL (H)).  Liver Function Tests: Recent Labs  Lab 03/25/21 2206  AST 20  ALT 28  ALKPHOS 33*  BILITOT 1.1  PROT 6.4*  ALBUMIN 3.9    CBG: Recent Labs  Lab 03/28/21 0747 03/28/21 1138 03/28/21 1605 03/28/21 2112 03/29/21 0743  GLUCAP 117* 227* 134* 176* 145*     Recent Results (from the past 240 hour(s))  SARS CORONAVIRUS 2 (TAT 6-24 HRS) Nasopharyngeal Nasopharyngeal Swab     Status: None   Collection Time: 03/25/21  9:48 PM   Specimen: Nasopharyngeal Swab  Result Value Ref Range  Status   SARS Coronavirus 2 NEGATIVE NEGATIVE Final    Comment: (NOTE) SARS-CoV-2 target nucleic acids are NOT DETECTED.  The SARS-CoV-2 RNA is generally detectable in upper and lower respiratory specimens during the acute phase of infection. Negative results do not preclude SARS-CoV-2 infection, do not rule out co-infections with other pathogens, and should not be used as the sole basis for treatment or other patient management decisions. Negative results must be combined with clinical observations, patient history, and epidemiological information. The expected result is Negative.  Fact Sheet for Patients: SugarRoll.be  Fact Sheet  for Healthcare Providers: https://www.woods-mathews.com/  This test is not yet approved or cleared by the [place] and  has been authorized for detection and/or diagnosis of SARS-CoV-2 by FDA under an Emergency Use Authorization (EUA). This EUA will remain  in effect (meaning this test can be used) for the duration of the COVID-19 declaration under Se ction 564(b)(1) of the Act, 21 U.S.C. section 360bbb-3(b)(1), unless the authorization is terminated or revoked sooner.  Performed at [facility], [place] [address] [place], [place] [address]          Radiology Studies: CARDIAC CATHETERIZATION  Result Date: 03/29/2021 LM: Normal LAD: Minimal luminal irregularities. Mild calcification. LCx: Normal Ramus: Minimal luminal irregularities. RCA: Minimal luminal irregularities. Mild calcification. Normal filling pressures Compensated nonischemic cardiomyopathy Recommend cardiac MRI given known hereditary hemochromatosis Continue guideline directed medical therapy for HFrEF Nigel Mormon, MD Pager: [phone] Office: [phone]   DG CHEST PORT 1 VIEW  Result Date: 03/28/2021 CLINICAL DATA:  Pulmonary edema, follow-up, history diabetes mellitus, hypertension, heart failure EXAM: PORTABLE CHEST 1  VIEW COMPARISON:  Portable exam 1128 hours compared to 03/25/2021 FINDINGS: Enlargement of cardiac silhouette. Mediastinal contours and pulmonary vascularity normal. Minimal interstitial prominence consistent with mild residual pulmonary edema, slightly improved. No pleural effusion or pneumothorax. Bones unremarkable. IMPRESSION: Slightly improved pulmonary edema. Electronically Signed   By: Lavonia Dana M.D.   On: 03/28/2021 12:50        Scheduled Meds: . allopurinol  300 mg Oral Daily  . amLODipine  10 mg Oral Daily  . [START ON 03/30/2021] aspirin EC  81 mg Oral Daily  . atorvastatin  20 mg Oral Daily  . bisoprolol  10 mg Oral Daily  . dapagliflozin propanediol  10 mg Oral Daily  . enoxaparin (LOVENOX) injection  40 mg Subcutaneous Daily  . famotidine  40 mg Oral Daily  . insulin aspart  0-15 Units Subcutaneous TID WC  . insulin aspart  0-5 Units Subcutaneous QHS  . insulin aspart protamine- aspart  15 Units Subcutaneous TID WC  . magnesium oxide  400 mg Oral Daily  . sacubitril-valsartan  1 tablet Oral BID  . sodium chloride flush  3 mL Intravenous Q12H  . sodium chloride flush  3 mL Intravenous Q12H  . spironolactone  25 mg Oral Daily  . tamsulosin  0.4 mg Oral QHS   Continuous Infusions: . sodium chloride    . sodium chloride    . nitroGLYCERIN Stopped (03/26/21 0925)     LOS: 3 days        Hosie Poisson, MD [facility]   To contact the attending provider between 7A-7P or the covering provider during after hours 7P-7A, please log into the web site [email or web address] and access using universal [place] password for that web site. If you do not have the password, please call the hospital operator.  03/29/2021, 11:17 AM

## 2021-03-29 NOTE — Interval H&P Note (Signed)
History and Physical Interval Note:  03/29/2021 9:13 AM  Brett [name]  has presented today for surgery, with the diagnosis of heart failure.  The various methods of treatment have been discussed with the patient and family. After consideration of risks, benefits and other options for treatment, the patient has consented to  Procedure(s): RIGHT/LEFT HEART CATH AND CORONARY ANGIOGRAPHY (N/A) as a surgical intervention.  The patient's history has been reviewed, patient examined, no change in status, stable for surgery.  I have reviewed the patient's chart and labs.  Questions were answered to the patient's satisfaction.    2012 Appropriate Use Criteria for Diagnostic Catheterization Home / Select Test of Interest Indication for RHC Cardiomyopathies Cardiomyopathies (Right and Left Heart Catheterization OR Right Heart Catheterization Alone With/Wit Cardiomyopathies  (Right and Left Heart Catheterization OR  Right Heart Catheterization Alone With/Without Left Ventriculography and Coronary Angiography)  Link Here: [employer].com.pt Indication:  Known or suspected cardiomyopathy with or without heart failure A (7) Indication: 93; Score 7   [name] J Elleni Mozingo

## 2021-03-30 ENCOUNTER — Inpatient Hospital Stay (HOSPITAL_COMMUNITY): Payer: Medicare Other

## 2021-03-30 DIAGNOSIS — I5021 Acute systolic (congestive) heart failure: Secondary | ICD-10-CM

## 2021-03-30 DIAGNOSIS — R778 Other specified abnormalities of plasma proteins: Secondary | ICD-10-CM

## 2021-03-30 LAB — GLUCOSE, CAPILLARY
Glucose-Capillary: 157 mg/dL — ABNORMAL HIGH (ref 70–99)
Glucose-Capillary: 285 mg/dL — ABNORMAL HIGH (ref 70–99)
Glucose-Capillary: 248 mg/dL — ABNORMAL HIGH (ref 70–99)
Glucose-Capillary: 163 mg/dL — ABNORMAL HIGH (ref 70–99)

## 2021-03-30 LAB — BASIC METABOLIC PANEL
Anion gap: 8 (ref 5–15)
BUN: 28 mg/dL — ABNORMAL HIGH (ref 8–23)
CO2: 26 mmol/L (ref 22–32)
Calcium: 8.9 mg/dL (ref 8.9–10.3)
Chloride: 103 mmol/L (ref 98–111)
Creatinine, Ser: 1.26 mg/dL — ABNORMAL HIGH (ref 0.61–1.24)
GFR, Estimated: 60 mL/min — ABNORMAL LOW (ref >60)
Glucose, Bld: 140 mg/dL — ABNORMAL HIGH (ref 70–99)
Potassium: 4.1 mmol/L (ref 3.5–5.1)
Sodium: 137 mmol/L (ref 135–145)

## 2021-03-30 IMAGING — MR MR CARD MORPHOLOGY WO/W CM
45 of 48 series · 45 of 48 positions shown · IV contrast (gadavist)
Comparison: none

CLINICAL DATA: Clinical question of cardiomyopathy in the setting
of known hemachromatosis
74 year old Caucasian male

Study assumes HCT of 40
EXAM:
CARDIAC MRI
TECHNIQUE: The patient was scanned on a 1.5 Tesla GE magnet. A dedicated
cardiac coil was used. Functional imaging was done using Fiesta
sequences. [DATE], and 4 chamber views were done to assess for RWMA's.
Modified ETTALA rule using a short axis stack was used to
calculate an ejection fraction on a dedicated work station using
Circle software. Patient asked to pause the study immediately after
contrast injection for urinal usage. The patient received 13 cc of
Gadavist. After 10 minutes inversion recovery sequences were used to
assess for infiltration and scar tissue.
CONTRAST:  13 cc  of Gadavist

[Series 4: [id number] · axial · 8.0mm · 1.56mm/px · 1 of 16 slices shown]
[im 1/16]
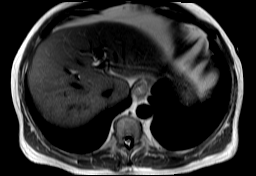

[Series 8: bSSFP · oblique · 8.0mm · 1.70mm/px · 1 of 25 slices shown (1 of 27)]
[im 1/25]
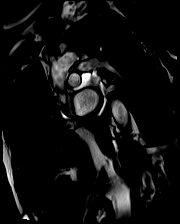

[Series 9: bSSFP · oblique · 8.0mm · 1.70mm/px · 1 of 25 slices shown (2 of 27)]
[im 1/25]
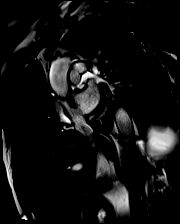

[Series 10: bSSFP · oblique · 8.0mm · 1.70mm/px · 1 of 25 slices shown (3 of 27)]
[im 1/25]
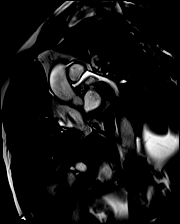

[Series 11: bSSFP · oblique · 8.0mm · 1.70mm/px · 1 of 25 slices shown (4 of 27)]
[im 1/25]
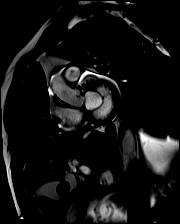

[Series 12: bSSFP · oblique · 8.0mm · 1.70mm/px · 1 of 25 slices shown (5 of 27)]
[im 1/25]
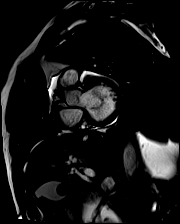

[Series 13: bSSFP · oblique · 8.0mm · 1.70mm/px · 1 of 25 slices shown (6 of 27)]
[im 1/25]
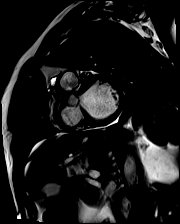

[Series 14: bSSFP · oblique · 8.0mm · 1.70mm/px · 1 of 25 slices shown (7 of 27)]
[im 1/25]
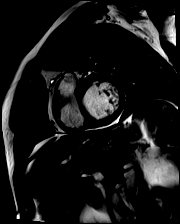

[Series 15: bSSFP · oblique · 8.0mm · 1.70mm/px · 1 of 25 slices shown (8 of 27)]
[im 1/25]
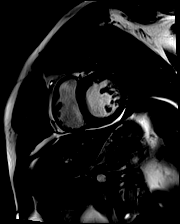

[Series 16: bSSFP · oblique · 8.0mm · 1.70mm/px · 1 of 25 slices shown (9 of 27)]
[im 1/25]
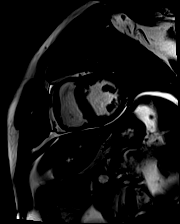

[Series 17: bSSFP · oblique · 8.0mm · 1.70mm/px · 1 of 25 slices shown (10 of 27)]
[im 1/25]
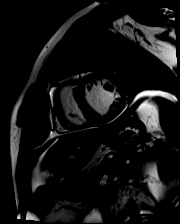

[Series 18: bSSFP · oblique · 8.0mm · 1.70mm/px · 1 of 25 slices shown (11 of 27)]
[im 1/25]
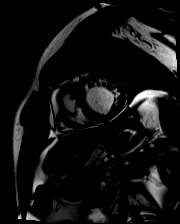

[Series 19: bSSFP · oblique · 8.0mm · 1.70mm/px · 1 of 25 slices shown (12 of 27)]
[im 1/25]
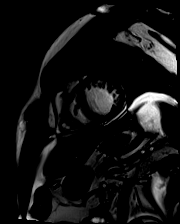

[Series 20: bSSFP · oblique · 8.0mm · 1.70mm/px · 1 of 25 slices shown (13 of 27)]
[im 1/25]
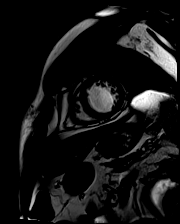

[Series 21: bSSFP · oblique · 8.0mm · 1.70mm/px · 1 of 25 slices shown (14 of 27)]
[im 1/25]
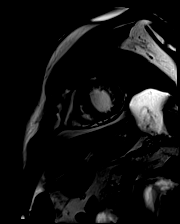

[Series 22: bSSFP · oblique · 8.0mm · 1.70mm/px · 1 of 25 slices shown (15 of 27)]
[im 1/25]
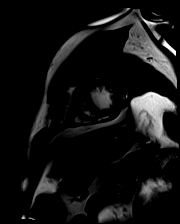

[Series 23: bSSFP · oblique · 8.0mm · 1.70mm/px · 1 of 25 slices shown (16 of 27)]
[im 1/25]
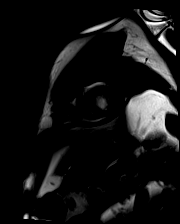

[Series 24: bSSFP · oblique · 8.0mm · 1.70mm/px · 1 of 25 slices shown (17 of 27)]
[im 1/25]
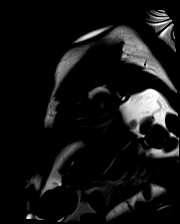

[Series 25: bSSFP · oblique · 8.0mm · 1.70mm/px · 1 of 25 slices shown (18 of 27)]
[im 1/25]
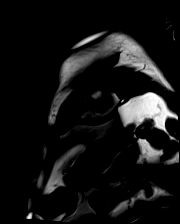

[Series 26: bSSFP · oblique · 8.0mm · 1.70mm/px · 1 of 25 slices shown (19 of 27)]
[im 1/25]
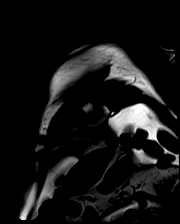

[Series 27: bSSFP · oblique · 8.0mm · 1.70mm/px · 1 of 25 slices shown (20 of 27)]
[im 1/25]
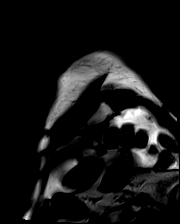

[Series 28: bSSFP · oblique · 8.0mm · 1.70mm/px · 1 of 25 slices shown (21 of 27)]
[im 1/25]
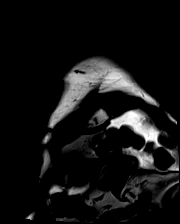

[Series 29: bSSFP · oblique · 8.0mm · 1.70mm/px · 1 of 25 slices shown (22 of 27)]
[im 1/25]
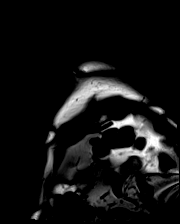

[Series 30: bSSFP · oblique · 8.0mm · 1.70mm/px · 1 of 25 slices shown (23 of 27)]
[im 1/25]
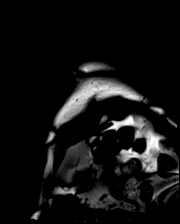

[Series 31: (id)_long_t1 · oblique · 8.0mm · 2.08mm/px · 1 of 24 slices shown]
[im 1/24]
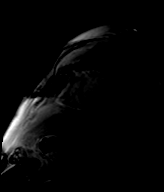

[Series 32: (id)_long_t1_moco · oblique · 8.0mm · 2.08mm/px · 1 of 24 slices shown]
[im 1/24]
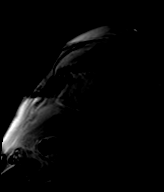

[Series 33: (id)_long_t1_moco_t1 · 1 of 3 slices shown (1 of 2)]
[im 1/3]
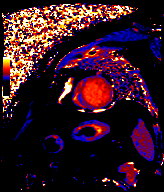

[Series 33: (id)_long_t1_moco_t1 · oblique · 8.0mm · 2.08mm/px · 1 of 3 slices shown (2 of 2)]
[im 1/3]
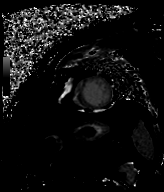

[Series 35: (id)_trufi · oblique · 8.0mm · 2.08mm/px · 1 of 9 slices shown]
[im 1/9]
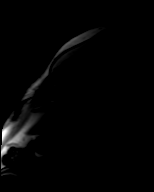

[Series 36: (id)_trufi_moco · oblique · 8.0mm · 2.08mm/px · 1 of 9 slices shown]
[im 1/9]
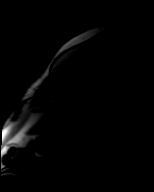

[Series 37: (id)_trufi_moco_t2 · oblique · 8.0mm · 2.08mm/px · 1 of 3 slices shown]
[im 1/3]
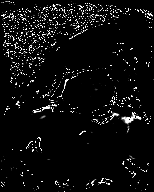

[Series 39: (id)__heart · oblique · 8.0mm · 1.83mm/px · 1 of 18 slices shown]
[im 1/18]
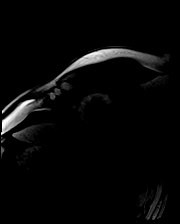

[Series 40: (id)__heart_(id) · oblique · 8.0mm · 1.83mm/px · 1 of 3 slices shown]
[im 1/3]
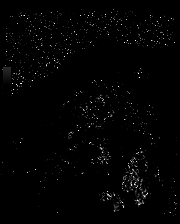

[Series 42: bSSFP · oblique · 6.0mm · 1.48mm/px · 1 of 25 slices shown (24 of 27)]
[im 1/25]
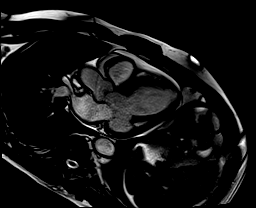

[Series 43: bSSFP · sagittal · 6.0mm · 1.48mm/px · 1 of 25 slices shown (25 of 27)]
[im 1/25]
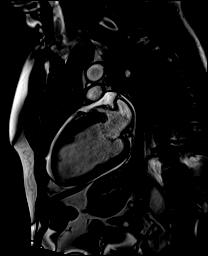

[Series 44: bSSFP · axial · 6.0mm · 1.56mm/px · 1 of 25 slices shown (26 of 27)]
[im 1/25]
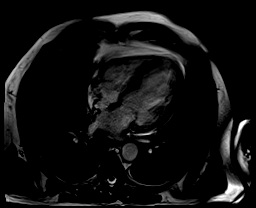

[Series 45: pre short axis · oblique · non-contrast · 8.0mm · 2.38mm/px · 1 of 10 slices shown (1 of 6)]
[im 1/10]
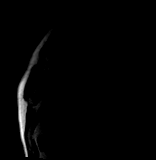

[Series 46: pre short axis · oblique · non-contrast · 8.0mm · 2.38mm/px · 1 of 10 slices shown (2 of 6)]
[im 1/10]
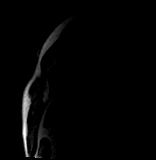

[Series 47: pre short axis · oblique · non-contrast · 8.0mm · 2.38mm/px · 1 of 10 slices shown (3 of 6)]
[im 1/10]
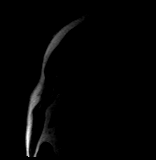

[Series 48: pre short axis · oblique · non-contrast · 8.0mm · 2.38mm/px · 1 of 10 slices shown (4 of 6)]
[im 1/10]
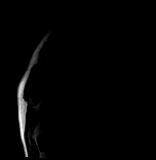

[Series 49: pre short axis · oblique · non-contrast · 8.0mm · 2.38mm/px · 1 of 10 slices shown (5 of 6)]
[im 1/10]
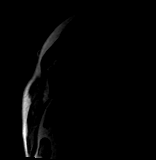

[Series 50: pre short axis · oblique · non-contrast · 8.0mm · 2.38mm/px · 1 of 10 slices shown (6 of 6)]
[im 1/10]
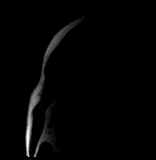

[Series 51: bSSFP · axial · 6.0mm · 1.79mm/px · 1 of 25 slices shown (27 of 27)]
[im 1/25]
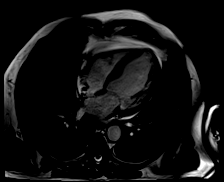

[Series 52: rest short axis · oblique · 8.0mm · 2.38mm/px · 1 of 60 slices shown (1 of 2)]
[im 1/60]
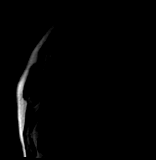

[Series 53: rest short axis · oblique · 8.0mm · 2.38mm/px · 1 of 60 slices shown (2 of 2)]
[im 1/60]
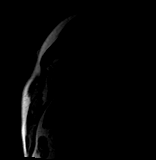

[45 of 48 positions shown; findings below may reference images not displayed]

FINDINGS: 1. Moderately dilated left ventricular size, with LVEDD 63 mm, but
LVEDVi 125 mL/m2.

Normal left ventricular thickness, with intraventricular septal
thickness of 10 mm, posterior wall thickness of 10 mm and myocardial
mass index of 77 g/m2.

Moderate left ventricular systolic dysfunction (LVEF =35%). There
are regional wall motion abnormalities: Mid anteroseptal,
inferoseptal, and inferior, and apical inferior and septal
hypokinesis.

Left ventricular parametric mapping notable for increase in Native
T1 signal ([TV] ms greatest in the basal inferior and inferoseptal);
increase in T2 in mid inferior (57 ms) and basal anterolateral (60
ms).

There is late gadolinium enhancement in the left ventricular
myocardium: Mid myocardial enhancement in the anteroseptal and
inferoseptal base that extends into the basal to mid inferior.

2. Normal right ventricular size with RVEDVI 74 mL/m2.

Normal right ventricular thickness.

Normal right ventricular systolic function (RVEF =49%). There are no
regional wall motion abnormalities or aneurysms.

3.  Normal left and right atrial size.

4. Normal size of the aortic root, ascending aorta and pulmonary
artery.

5.  No significant valvular abnormalities.

6.  Normal pericardium.  No pericardial effusion.

7. Grossly, no extracardiac findings. Recommended dedicated study if
concerned for non-cardiac pathology.

8.  Significant breath hold artifact.
IMPRESSION: LV function is diminished. Parametric mapping is not consistent with
cardiac hereditary hemachromatosis.

## 2021-03-30 MED ORDER — FUROSEMIDE 10 MG/ML IJ SOLN: 40.0000 mg | Freq: Two times a day (BID) | INTRAMUSCULAR | Status: DC

## 2021-03-30 MED ORDER — FUROSEMIDE 40 MG PO TABS
40.0000 mg | ORAL_TABLET | Freq: Two times a day (BID) | ORAL | Status: DC
  Administered 2021-03-30 – 2021-03-31 (×3): 40 mg via ORAL
  Filled 2021-03-30 (×3): qty 1

## 2021-03-30 MED ORDER — MELATONIN 3 MG PO TABS
3.0000 mg | ORAL_TABLET | Freq: Every day | ORAL | Status: DC
  Administered 2021-03-30: 3 mg via ORAL
  Filled 2021-03-30: qty 1

## 2021-03-30 MED ORDER — GADOBUTROL 1 MMOL/ML IV SOLN
13.0000 mL | Freq: Once | INTRAVENOUS | Status: AC | PRN
  Administered 2021-03-30: 13 mL via INTRAVENOUS

## 2021-03-30 NOTE — Progress Notes (Signed)
[facility] PROGRESS NOTE    Progress Note  Brett [name]  OIZ:[id number] DOB: [birth year] DOA: 03/25/2021 PCP: Azzie Glatter, FNP (Inactive)     Brief Narrative:   Brett [name] is an 75 y.o. male past medical history of Pincus Large hemochromatosis, diabetes mellitus type 2 comes into the hospital for several days of exertional dyspnea lower extremity edema for found to be in pulmonary edema has to be placed on BiPAP and started on IV Lasix now weaned to nasal cannula.  Underwent cardiac cath that showed nonobstructive disease.    Assessment/Plan:   Acute respiratory failure with hypoxia due to newly diagnosed acute systolic heart failure/nonischemic cardiomyopathy: Currently on bisoprolol, Norvasc, Aldactone, Wilder Glade and Entresto. Cardiology has been consulted. Negative about 4 L, monitor strict I's and O's and daily weights monitor electrolytes and replete as needed.  Now on room air. Back in 2021 his weight was around 95 kg. On 03/29/2021 showed nonobstructive coronary artery disease. Question nonischemic cardiomyopathy, cardiology recommended a cardiac MRI due to his history of hereditary hemochromatosis.  Hemochromatosis usually causes diastolic dysfunction with conduction abnormalities his last ferritin was 44 2 months ago.  Dietary hemochromatosis: Last ferritin was 44 going for cardiac MRI.  Essential hypertension: Blood pressure seems to be well controlled.  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  Monitor to keep potassium greater than 4 magnesium greater than 2.  Hyperlipidemia: Excellent continue Lipitor.  Hypokalemia: Replete orally now resolved.  Acute confusional state: Continue reorientation start him on melatonin at night.  BPH: Continue Flomax.   DVT prophylaxis: lovenoxn Family Communication:none Status is: Inpatient  Remains  inpatient appropriate because:Hemodynamically unstable   Dispo: The patient is from: Home              Anticipated d/c is to: Home              Patient currently is not medically stable to d/c.   Difficult to place patient No        Code Status:     Code Status Orders  (From admission, onward)         Start     Ordered   03/26/21 0152  Full code  Continuous        03/26/21 0151        Code Status History    This patient has a current code status but no historical code status.   Advance Care Planning Activity        IV Access:    Peripheral IV   Procedures and diagnostic studies:   CARDIAC CATHETERIZATION  Result Date: 03/29/2021 LM: Normal LAD: Minimal luminal irregularities. Mild calcification. LCx: Normal Ramus: Minimal luminal irregularities. RCA: Minimal luminal irregularities. Mild calcification. Normal filling pressures Compensated nonischemic cardiomyopathy Recommend cardiac MRI given known hereditary hemochromatosis Continue guideline directed medical therapy for HFrEF Nigel Mormon, MD Pager: [phone] Office: [phone]   DG CHEST PORT 1 VIEW  Result Date: 03/28/2021 CLINICAL DATA:  Pulmonary edema, follow-up, history diabetes mellitus, hypertension, heart failure EXAM: PORTABLE CHEST 1 VIEW COMPARISON:  Portable exam 1128 hours compared to 03/25/2021 FINDINGS: Enlargement of cardiac silhouette. Mediastinal contours and pulmonary vascularity normal. Minimal interstitial prominence consistent with mild residual pulmonary edema, slightly improved. No pleural effusion or pneumothorax. Bones unremarkable. IMPRESSION: Slightly improved pulmonary edema. Electronically Signed   By: Lavonia Dana M.D.   On: 03/28/2021 12:50  Medical Consultants:    None.   Subjective:    Brett [name] he relates no orthopnea  Objective:    Vitals:   03/30/21 0006 03/30/21 0407 03/30/21 0500 03/30/21 0724  BP: 124/83 118/60  133/74  Pulse: (!) 59 60   (!) 59  Resp: 18 17  18   Temp: 98.7 F (37.1 C) 98.3 F (36.8 C)  97.8 F (36.6 C)  TempSrc: Oral Oral  Oral  SpO2: 99% 94%  96%  Weight: 98.7 kg  98.7 kg   Height:       SpO2: 96 % O2 Flow Rate (L/min): 4 L/min FiO2 (%): 60 %   Intake/Output Summary (Last 24 hours) at 03/30/2021 0751 Last data filed at 03/30/2021 0414 Gross per 24 hour  Intake 260 ml  Output 1420 ml  Net -1160 ml   Filed Weights   03/29/21 1550 03/30/21 0006 03/30/21 0500  Weight: 98.2 kg 98.7 kg 98.7 kg    Exam: General exam: In no acute distress. Respiratory system: Good air movement and clear to auscultation. Cardiovascular system: S1 & S2 heard, RRR. No JVD. Gastrointestinal system: Abdomen is nondistended, soft and nontender.  Extremities: No pedal edema. Skin: No rashes, lesions or ulcers Psychiatry: Judgement and insight appear normal. Mood & affect appropriate.    Data Reviewed:    Labs: Basic Metabolic Panel: Recent Labs  Lab 03/26/21 0728 03/27/21 0331 03/27/21 1500 03/28/21 0235 03/29/21 0746 03/29/21 0946 03/30/21 0411  NA 139 139  --  139 139 141  142 137  K 3.4* 3.4*  --  3.0* 4.0 3.9  3.9 4.1  CL 103 102  --  101 105  --  103  CO2 27 28  --  29 25  --  26  GLUCOSE 180* 130*  --  71 142*  --  140*  BUN 18 20  --  26* 26*  --  28*  CREATININE 1.16 1.26*  --  1.35* 1.30*  --  1.26*  CALCIUM 8.8* 8.9  --  9.0 9.4  --  8.9  MG  --   --  2.2 2.6*  --   --   --    GFR Estimated Creatinine Clearance: 55.6 mL/min (A) (by C-G formula based on SCr of 1.26 mg/dL (H)). Liver Function Tests: Recent Labs  Lab 03/25/21 2206  AST 20  ALT 28  ALKPHOS 33*  BILITOT 1.1  PROT 6.4*  ALBUMIN 3.9   No results for input(s): LIPASE, AMYLASE in the last 168 hours. No results for input(s): AMMONIA in the last 168 hours. Coagulation profile No results for input(s): INR, PROTIME in the last 168 hours. COVID-19 Labs  No results for input(s): DDIMER, FERRITIN, LDH, CRP in the last 72  hours.  Lab Results  Component Value Date   [place] NEGATIVE 03/25/2021    CBC: Recent Labs  Lab 03/25/21 2206 03/25/21 2227 03/26/21 0326 03/29/21 0746 03/29/21 0946  WBC 10.4  --  9.1 7.5  --   NEUTROABS 8.0*  --   --  4.5  --   HGB 13.5 12.6* 13.2 15.5 13.6  13.9  HCT 38.4* 37.0* 36.9* 43.1 40.0  41.0  MCV 91.6  --  89.8 89.0  --   PLT 172  --  184 202  --    Cardiac Enzymes: No results for input(s): CKTOTAL, CKMB, CKMBINDEX, TROPONINI in the last 168 hours. BNP (last 3 results) No results for input(s): PROBNP in the last 8760 hours. CBG: Recent  Labs  Lab 03/29/21 0743 03/29/21 1118 03/29/21 1642 03/29/21 2128 03/30/21 0520  GLUCAP 145* 95 103* 160* 157*   D-Dimer: No results for input(s): DDIMER in the last 72 hours. Hgb A1c: No results for input(s): HGBA1C in the last 72 hours. Lipid Profile: No results for input(s): CHOL, HDL, LDLCALC, TRIG, CHOLHDL, LDLDIRECT in the last 72 hours. Thyroid function studies: No results for input(s): TSH, T4TOTAL, T3FREE, THYROIDAB in the last 72 hours.  Invalid input(s): FREET3 Anemia work up: No results for input(s): VITAMINB12, FOLATE, FERRITIN, TIBC, IRON, RETICCTPCT in the last 72 hours. Sepsis Labs: Recent Labs  Lab 03/25/21 2206 03/26/21 0326 03/29/21 0746  WBC 10.4 9.1 7.5   Microbiology Recent Results (from the past 240 hour(s))  SARS CORONAVIRUS 2 (TAT 6-24 HRS) Nasopharyngeal Nasopharyngeal Swab     Status: None   Collection Time: 03/25/21  9:48 PM   Specimen: Nasopharyngeal Swab  Result Value Ref Range Status   SARS Coronavirus 2 NEGATIVE NEGATIVE Final    Comment: (NOTE) SARS-CoV-2 target nucleic acids are NOT DETECTED.  The SARS-CoV-2 RNA is generally detectable in upper and lower respiratory specimens during the acute phase of infection. Negative results do not preclude SARS-CoV-2 infection, do not rule out co-infections with other pathogens, and should not be used as the sole basis for  treatment or other patient management decisions. Negative results must be combined with clinical observations, patient history, and epidemiological information. The expected result is Negative.  Fact Sheet for Patients: SugarRoll.be  Fact Sheet for Healthcare Providers: https://www.woods-mathews.com/  This test is not yet approved or cleared by the [place] FDA and  has been authorized for detection and/or diagnosis of SARS-CoV-2 by FDA under an Emergency Use Authorization (EUA). This EUA will remain  in effect (meaning this test can be used) for the duration of the COVID-19 declaration under Se ction 564(b)(1) of the Act, 21 U.S.C. section 360bbb-3(b)(1), unless the authorization is terminated or revoked sooner.  Performed at [facility], [place] [address] [place], [place] [address]      Medications:   . allopurinol  300 mg Oral Daily  . amLODipine  10 mg Oral Daily  . aspirin EC  81 mg Oral Daily  . atorvastatin  20 mg Oral Daily  . bisoprolol  10 mg Oral Daily  . dapagliflozin propanediol  10 mg Oral Daily  . enoxaparin (LOVENOX) injection  40 mg Subcutaneous Daily  . famotidine  40 mg Oral Daily  . insulin aspart  0-15 Units Subcutaneous TID WC  . insulin aspart  0-5 Units Subcutaneous QHS  . insulin aspart protamine- aspart  15 Units Subcutaneous TID WC  . magnesium oxide  400 mg Oral Daily  . sacubitril-valsartan  1 tablet Oral BID  . sodium chloride flush  3 mL Intravenous Q12H  . sodium chloride flush  3 mL Intravenous Q12H  . spironolactone  25 mg Oral Daily  . tamsulosin  0.4 mg Oral QHS   Continuous Infusions: . sodium chloride    . sodium chloride    . nitroGLYCERIN Stopped (03/26/21 0925)      LOS: 4 days   Charlynne Cousins  [facility]  03/30/2021, 7:51 AM

## 2021-03-30 NOTE — Progress Notes (Signed)
Subjective:  Patient resting comfortably with wife present at bedside. Patient denies chest pain, orthopnea.  Reports dyspnea and leg swelling have resolved since admission.  Patient is now tolerating room air at time of exam. Cardiac catheterization yesterday (03/29/2021) revealed nonobstructive CAD. Cardiac MRI is pending, which is recommended given patient's history of hemochromatosis.  Intake/Output from previous day:  I/O last 3 completed shifts: In: 271.2 [P.O.:240; I.V.:31.2] Out: 1620 [Urine:1620] No intake/output data recorded.  Blood pressure 133/74, pulse (!) 59, temperature 97.8 F (36.6 C), temperature source Oral, resp. rate 18, height 5' 5"  (1.651 m), weight 98.7 kg, SpO2 96 %. Physical Exam Vitals reviewed.  HENT:     Head: Normocephalic and atraumatic.  Neck:     Vascular: No carotid bruit or JVD.  Cardiovascular:     Rate and Rhythm: Normal rate and regular rhythm.     Pulses: Intact distal pulses.     Heart sounds: S1 normal and S2 normal. No murmur heard. No gallop.   Pulmonary:     Effort: Pulmonary effort is normal. No respiratory distress.     Breath sounds: No wheezing, rhonchi or rales.  Musculoskeletal:     Right lower leg: Edema (trace) present.     Left lower leg: Edema (trace) present.  Skin:    General: Skin is warm and dry.  Neurological:     Mental Status: He is alert.     Lab Results: BMP BNP (last 3 results) Recent Labs    03/26/21 1830 03/27/21 0331 03/28/21 0235  BNP 193.5* 107.5* 65.0    ProBNP (last 3 results) No results for input(s): PROBNP in the last 8760 hours. BMP Latest Ref Rng & Units 03/30/2021 03/29/2021 03/29/2021  Glucose 70 - 99 mg/dL 140(H) - -  BUN 8 - 23 mg/dL 28(H) - -  Creatinine 0.61 - 1.24 mg/dL 1.26(H) - -  BUN/Creat Ratio 10 - 24 - - -  Sodium 135 - 145 mmol/L 137 141 142  Potassium 3.5 - 5.1 mmol/L 4.1 3.9 3.9  Chloride 98 - 111 mmol/L 103 - -  CO2 22 - 32 mmol/L 26 - -  Calcium 8.9 - 10.3 mg/dL 8.9 - -    Hepatic Function Latest Ref Rng & Units 03/25/2021 09/16/2020 07/06/2020  Total Protein 6.5 - 8.1 g/dL 6.4(L) 6.8 6.5  Albumin 3.5 - 5.0 g/dL 3.9 4.5 -  AST 15 - 41 U/L 20 17 20   ALT 0 - 44 U/L 28 27 29   Alk Phosphatase 38 - 126 U/L 33(L) 54 -  Total Bilirubin 0.3 - 1.2 mg/dL 1.1 0.7 0.6  Bilirubin, Direct 0.0 - 0.2 mg/dL - - -   CBC Latest Ref Rng & Units 03/29/2021 03/29/2021 03/29/2021  WBC 4.0 - 10.5 K/uL - - 7.5  Hemoglobin 13.0 - 17.0 g/dL 13.6 13.9 15.5  Hematocrit 39.0 - 52.0 % 40.0 41.0 43.1  Platelets 150 - 400 K/uL - - 202   Lipid Panel     Component Value Date/Time   CHOL 111 03/26/2021 0326   TRIG 42 03/26/2021 0326   HDL 39 (L) 03/26/2021 0326   CHOLHDL 2.8 03/26/2021 0326   VLDL 8 03/26/2021 0326   LDLCALC 64 03/26/2021 0326   LDLCALC 56 07/06/2020 1429   Cardiac Panel (last 3 results) No results for input(s): CKTOTAL, CKMB, TROPONINI, RELINDX in the last 72 hours.  HEMOGLOBIN A1C Lab Results  Component Value Date   HGBA1C 7.6 (H) 03/26/2021   MPG 171 03/26/2021   TSH Recent Labs  07/06/20 1429 09/16/20 1127 03/26/21 1811  TSH 2.91 4.820* 3.764   Imaging: Chest x-ray 03/25/2021: PORTABLE CHEST 1 VIEW COMPARISON: Chest radiograph 09/11/2014  FINDINGS: The heart is enlarged. Mild to moderate pulmonary edema. Trace fluid in the right minor fissure. No large subpulmonic effusion. Bibasilar atelectasis without confluent airspace disease. No pneumothorax. No acute osseous abnormalities are seen.  IMPRESSION: CHF. Cardiomegaly with pulmonary edema and fluid in the fissures.  Cardiac Studies: Echocardiogram 03/26/2021: 1. Left ventricular ejection fraction, by estimation, is 30 to 35%. The  left ventricle has moderately decreased function. The left ventricle  demonstrates regional wall motion abnormalities (see scoring  diagram/findings for description). The  mid-to-apical inferoseptal, mid-to-apical anteroseptal, mid-to-apical  inferior, and apex  appear severely hypokinetic. The left ventricular  internal cavity size was mildly dilated. There is moderate asymmetric  hypertrophy of the basal septum (measures  1.5cm). The rest of the LV segments demonstrate mild left ventricular  hypertrophy. Left ventricular diastolic parameters are consistent with  Grade II diastolic dysfunction (pseudonormalization). Elevated left atrial  pressure.  2. Right ventricular systolic function is normal. The right ventricular  size is normal. Tricuspid regurgitation signal is inadequate for assessing  PA pressure.  3. The mitral valve is grossly normal. Trivial mitral valve  regurgitation. No evidence of mitral stenosis.  4. The aortic valve is tricuspid. There is mild thickening of the aortic  valve. Aortic valve regurgitation is trivial. Mild aortic valve sclerosis  is present, with no evidence of aortic valve stenosis.   Comparison(s): Compared to prior echo report in 2011, the LVEF is now  moderately depressed at 30-35% with wall motion abnormalities as described  above.   EKG:  03/25/2021: Sinus rhythm 85 bpm Left bundle branch block   Scheduled Meds: . allopurinol  300 mg Oral Daily  . amLODipine  10 mg Oral Daily  . aspirin EC  81 mg Oral Daily  . atorvastatin  20 mg Oral Daily  . bisoprolol  10 mg Oral Daily  . dapagliflozin propanediol  10 mg Oral Daily  . enoxaparin (LOVENOX) injection  40 mg Subcutaneous Daily  . famotidine  40 mg Oral Daily  . insulin aspart  0-15 Units Subcutaneous TID WC  . insulin aspart  0-5 Units Subcutaneous QHS  . insulin aspart protamine- aspart  15 Units Subcutaneous TID WC  . magnesium oxide  400 mg Oral Daily  . sacubitril-valsartan  1 tablet Oral BID  . sodium chloride flush  3 mL Intravenous Q12H  . sodium chloride flush  3 mL Intravenous Q12H  . spironolactone  25 mg Oral Daily  . tamsulosin  0.4 mg Oral QHS   Continuous Infusions: . sodium chloride    . sodium chloride    . nitroGLYCERIN  Stopped (03/26/21 0925)   PRN Meds:.sodium chloride, sodium chloride, acetaminophen, sodium chloride flush, sodium chloride flush  Assessment/Plan:   HFrEF: Acute, new diagnosis, NYHA class IV Nonischemic cardiomyopathy Cardiac catheterization revealed nonobstructive CAD Patient will undergo cardiac MRI given history of hemochromatosis  Patient has diuresed well and appears euvolemic on exam. Continue guideline directed medical therapy including bisoprolol, Farxiga, spironolactone, and Entresto.  Continue to uptitrate guideline directed medical therapy as tolerated.  Nonischemic cardiomyopathy: Continue guideline directed medical therapy Patient planned to undergo cardiac MRI for further evaluation  Troponin elevation: Secondary to acute heart failure. Continue to monitor.   Patient was seen in collaboration with Dr. Virgina Jock. He also reviewed patient's chart and examined the patient. Dr. Virgina Jock is in agreement of  the plan.     Alethia Berthold, PA-C 03/29/2021, 7:42 AM Office: [phone]

## 2021-03-30 NOTE — Care Management Important Message (Signed)
Important Message  Patient Details  Name: Brett [name] MRN: [id number] Date of Birth: 08/26/1946   Medicare Important Message Given:  Yes     [name] 03/30/2021, 8:20 AM

## 2021-03-31 DIAGNOSIS — I429 Cardiomyopathy, unspecified: Secondary | ICD-10-CM

## 2021-03-31 DIAGNOSIS — I5020 Unspecified systolic (congestive) heart failure: Secondary | ICD-10-CM

## 2021-03-31 LAB — BASIC METABOLIC PANEL
Anion gap: 9 (ref 5–15)
BUN: 30 mg/dL — ABNORMAL HIGH (ref 8–23)
CO2: 26 mmol/L (ref 22–32)
Calcium: 9.1 mg/dL (ref 8.9–10.3)
Chloride: 101 mmol/L (ref 98–111)
Creatinine, Ser: 1.4 mg/dL — ABNORMAL HIGH (ref 0.61–1.24)
GFR, Estimated: 53 mL/min — ABNORMAL LOW (ref >60)
Glucose, Bld: 118 mg/dL — ABNORMAL HIGH (ref 70–99)
Potassium: 3.5 mmol/L (ref 3.5–5.1)
Sodium: 136 mmol/L (ref 135–145)

## 2021-03-31 LAB — GLUCOSE, CAPILLARY
Glucose-Capillary: 131 mg/dL — ABNORMAL HIGH (ref 70–99)
Glucose-Capillary: 219 mg/dL — ABNORMAL HIGH (ref 70–99)

## 2021-03-31 MED ORDER — GLIPIZIDE 10 MG PO TABS: 10.0000 mg | ORAL_TABLET | Freq: Every day | ORAL | 1 refills | Status: DC

## 2021-03-31 MED ORDER — BISOPROLOL FUMARATE 10 MG PO TABS: 10.0000 mg | ORAL_TABLET | Freq: Every day | ORAL | 2 refills | Status: DC

## 2021-03-31 MED ORDER — DAPAGLIFLOZIN PROPANEDIOL 10 MG PO TABS: 10.0000 mg | ORAL_TABLET | Freq: Every day | ORAL | 2 refills | Status: DC

## 2021-03-31 MED ORDER — SACUBITRIL-VALSARTAN 49-51 MG PO TABS: 1.0000 | ORAL_TABLET | Freq: Two times a day (BID) | ORAL | 2 refills | Status: DC

## 2021-03-31 MED ORDER — SPIRONOLACTONE 25 MG PO TABS: 25.0000 mg | ORAL_TABLET | Freq: Every day | ORAL | 2 refills | Status: DC

## 2021-03-31 NOTE — Progress Notes (Signed)
Pt d/c paperwork given with questions or concerns.

## 2021-03-31 NOTE — TOC Progression Note (Signed)
Transition of Care [facility]) - Progression Note    Patient Details  Name: Brett [name] MRN: [id number] Date of Birth: [birth year]  Transition of [facility]) CM/SW Contact  Zenon Mayo, RN Phone Number: 03/31/2021, 12:28 PM  Clinical Narrative:    NCM spoke with patient, he is from home with his wife, he has a scale and bp cuff at home.  He states he does not need any other DME.  He states he has no issues with getting his medications and he tries to consume a low sodium diet but will try to do better.  He has transport at discharge. He has no other needs.     Barriers to Discharge: No Barriers Identified  Expected Discharge Plan and Services           Expected Discharge Date: 03/31/21                 DME Agency: NA       HH Arranged: NA           Social Determinants of Health (SDOH) Interventions    Readmission Risk Interventions No flowsheet data found.

## 2021-03-31 NOTE — Progress Notes (Signed)
Okay to discharge today. Will arrange outpatient f/u.   Nigel Mormon, MD Pager: [phone] Office: [phone]

## 2021-03-31 NOTE — Discharge Summary (Signed)
Physician Discharge Summary  ANASTACIO BUA SEG:[id number] DOB: 19-Jun-1946 DOA: 03/25/2021  PCP: Azzie Glatter, FNP (Inactive)  Admit date: 03/25/2021 Discharge date: 03/31/2021  Time spent: 50 minutes  Recommendations for Outpatient Follow-up:  1. Follow-up cardiology as outpatient   Discharge Diagnoses:  Active Problems:   Hypertension   Type 2 diabetes mellitus with stage 2 chronic kidney disease, with long-term current use of insulin (HCC)   Acute respiratory failure with hypoxia (HCC)   Pulmonary edema   Cardiomegaly   Prolonged QT interval   HFrEF (heart failure with reduced ejection fraction) (HCC)   Acute HFrEF (heart failure with reduced ejection fraction) (HCC)   Cardiomyopathy (HCC)   LBBB (left bundle branch block)   Elevated troponin level not due myocardial infarction   Discharge Condition: Stable  Diet recommendation: Heart healthy diet  Filed Weights   03/30/21 0006 03/30/21 0500 03/31/21 0435  Weight: 98.7 kg 98.7 kg 97 kg    History of present illness:  75 year old male with history of hereditary hemochromatosis, hypertension, diabetes mellitus type 2, untreated OSA, presented with new diagnosis of acute HFrEF, nonischemic cardiomyopathy.  Hospital Course:   Acute systolic heart failure -Nonischemic cardiomyopathy; EF 30-35 % -Cardiology was consulted -Patient started on bisoprolol, Farxiga, Lasix, Entresto, Aldactone -Currently euvolemic -Cardiac MRI was done yesterday, result is currently pending. -Cardiology has cleared patient for discharge pending follow-up MRI result as outpatient.  Hereditary hemochromatosis -Cardiac MRI done as above, result pending  Essential hypertension -Blood pressure is stable  Diabetes mellitus type 2 with CKD stage II -Continue glipizide, Farxiga as outpatient -Patient takes glipizide 10 mg every morning  BPH -Continue Flomax  Hyperlipidemia -Continue  Lipitor  Procedures:  Echocardiogram  Consultations:  Cardiology  Discharge Exam: Vitals:   03/31/21 0714 03/31/21 1203  BP: 123/78 115/65  Pulse: (!) 53 (!) 56  Resp: 18 18  Temp: 98.3 F (36.8 C) 98.7 F (37.1 C)  SpO2: 97% 96%    General: Appears in no acute distress Cardiovascular: S1-S2, regular Respiratory: Clear to auscultation bilaterally  Discharge Instructions   Discharge Instructions    Diet - low sodium heart healthy   Complete by: As directed    Increase activity slowly   Complete by: As directed      Allergies as of 03/31/2021   No Known Allergies     Medication List    STOP taking these medications   amLODipine 10 MG tablet Commonly known as: NORVASC   bisoprolol-hydrochlorothiazide 10-6.25 MG tablet Commonly known as: ZIAC   cetirizine 10 MG tablet Commonly known as: ZYRTEC   diphenhydramine-acetaminophen 25-500 MG Tabs tablet Commonly known as: TYLENOL PM   minoxidil 10 MG tablet Commonly known as: LONITEN   olmesartan 40 MG tablet Commonly known as: BENICAR     TAKE these medications   allopurinol 300 MG tablet Commonly known as: ZYLOPRIM TAKE 1 TABLET BY MOUTH  DAILY TO PREVENT GOUT What changed:   how much to take  how to take this  when to take this  additional instructions   aspirin 81 MG tablet Take 81 mg by mouth daily.   atorvastatin 20 MG tablet Commonly known as: LIPITOR TAKE 1 TABLET BY MOUTH  DAILY FOR CHOLESTEROL What changed:   how much to take  how to take this  when to take this  additional instructions   bisoprolol 10 MG tablet Commonly known as: ZEBETA Take 1 tablet (10 mg total) by mouth daily.   Blood Glucose Monitoring Suppl [place]  Check blood sugar 3 times a day-DX-E11.22   CLARITIN PO Take 1 tablet by mouth daily.   D-Mannose 500 MG Caps Take 1,500 mg by mouth daily.   dapagliflozin propanediol 10 MG Tabs tablet Commonly known as: FARXIGA Take 1 tablet (10 mg total) by mouth  daily. Start taking on: April 01, 2021   famotidine 40 MG tablet Commonly known as: PEPCID Take 40 mg by mouth daily.   furosemide 40 MG tablet Commonly known as: LASIX TAKE 1 TABLET BY MOUTH  DAILY FOR BP, / FLUID  RETENTION / ANKLE SWELLING What changed:   how much to take  how to take this  when to take this  additional instructions   glipiZIDE 10 MG tablet Commonly known as: GLUCOTROL Take 1 tablet (10 mg total) by mouth daily before breakfast. What changed:   how much to take  how to take this  when to take this  additional instructions   IGlucose Test Strips test strip Generic drug: glucose blood Check blood sugar 3 times daily-DX-E11.22   insulin isophane & regular human (70-30) 100 UNIT/ML KwikPen Commonly known as: HUMULIN 70/30 MIX Inject into subcutaneous skin 21min before meals, 45units breakfast, 40units lunch and 22units dinner. What changed: additional instructions   Insulin Pen Needle 31G X 8 MM Misc Use one three times a day and as needed [place]   Lancets Ultra Fine Misc Check blood sugar 3 times a day-E11.22   magnesium oxide 400 MG tablet Commonly known as: MAG-OX Take 400 mg by mouth daily.   Melatonin 10 MG Caps Take 10 mg by mouth at bedtime as needed (sleep).   metFORMIN 500 MG 24 hr tablet Commonly known as: GLUCOPHAGE-XR Take    2 tablets    2 x /day    With Meals for Diabetes What changed:   how much to take  how to take this  when to take this  additional instructions   montelukast 10 MG tablet Commonly known as: SINGULAIR TAKE 1 TABLET BY MOUTH  DAILY FOR ALLERGIES What changed:   how much to take  how to take this  when to take this  additional instructions   sacubitril-valsartan 49-51 MG Commonly known as: ENTRESTO Take 1 tablet by mouth 2 (two) times daily.   spironolactone 25 MG tablet Commonly known as: ALDACTONE Take 1 tablet (25 mg total) by mouth daily.   tadalafil 20 MG tablet Commonly known as:  CIALIS Take 1/2 to 1  tablet every 2 to 3 days if needed for XXXX What changed:   how much to take  how to take this  when to take this  reasons to take this  additional instructions   tamsulosin 0.4 MG Caps capsule Commonly known as: FLOMAX TAKE 1 CAPSULE BY MOUTH AT  BEDTIME FOR PROSTATE What changed:   how much to take  how to take this  when to take this  additional instructions   VITAMIN D PO Take 5,000 Units by mouth daily.      No Known Allergies  Follow-up Information    Cantwell, Celeste C, PA-C. Go on 04/12/2021.   Specialty: Cardiology Why: Appointment at 2:00pm. Please arrive 15 minutes early and bring all medications with you.  Contact information: [address] [place] [address] [phone]                The results of significant diagnostics from this hospitalization (including imaging, microbiology, ancillary and laboratory) are listed below for  reference.    Significant Diagnostic Studies: CARDIAC CATHETERIZATION  Result Date: 03/29/2021 LM: Normal LAD: Minimal luminal irregularities. Mild calcification. LCx: Normal Ramus: Minimal luminal irregularities. RCA: Minimal luminal irregularities. Mild calcification. Normal filling pressures Compensated nonischemic cardiomyopathy Recommend cardiac MRI given known hereditary hemochromatosis Continue guideline directed medical therapy for HFrEF Nigel Mormon, MD Pager: [phone] Office: [phone]   DG CHEST PORT 1 VIEW  Result Date: 03/28/2021 CLINICAL DATA:  Pulmonary edema, follow-up, history diabetes mellitus, hypertension, heart failure EXAM: PORTABLE CHEST 1 VIEW COMPARISON:  Portable exam 1128 hours compared to 03/25/2021 FINDINGS: Enlargement of cardiac silhouette. Mediastinal contours and pulmonary vascularity normal. Minimal interstitial prominence consistent with mild residual pulmonary edema, slightly improved. No pleural effusion or pneumothorax. Bones  unremarkable. IMPRESSION: Slightly improved pulmonary edema. Electronically Signed   By: Lavonia Dana M.D.   On: 03/28/2021 12:50   DG Chest Port 1 View  Result Date: 03/25/2021 CLINICAL DATA:  Hypoxia and difficulty breathing for 2 days. EXAM: PORTABLE CHEST 1 VIEW COMPARISON:  Chest radiograph 09/11/2014 FINDINGS: The heart is enlarged. Mild to moderate pulmonary edema. Trace fluid in the right minor fissure. No large subpulmonic effusion. Bibasilar atelectasis without confluent airspace disease. No pneumothorax. No acute osseous abnormalities are seen. IMPRESSION: CHF.  Cardiomegaly with pulmonary edema and fluid in the fissures. Electronically Signed   By: Keith Rake M.D.   On: 03/25/2021 22:18   ECHOCARDIOGRAM COMPLETE  Result Date: 03/26/2021    ECHOCARDIOGRAM REPORT   Patient Name:   Brett [name] Date of Exam: 03/26/2021 Medical Rec #:  [id number]      Height:       65.0 in Accession #:    [id number]     Weight:       237.4 lb Date of Birth:  Oct 10, 1946      BSA:          2.128 m Patient Age:    75 years       BP:           124/72 mmHg Patient Gender: M              HR:           66 bpm. Exam Location:  Inpatient Procedure: 2D Echo Indications:    dyspnea  History:        Patient has prior history of Echocardiogram examinations, most                 recent 01/08/2010. Cardiomegaly; Risk Factors:Hypertension,                 Diabetes and Sleep Apnea.  Sonographer:    Johny Chess Referring Phys: [id number] [place]  1. Left ventricular ejection fraction, by estimation, is 30 to 35%. The left ventricle has moderately decreased function. The left ventricle demonstrates regional wall motion abnormalities (see scoring diagram/findings for description). The mid-to-apical inferoseptal, mid-to-apical anteroseptal, mid-to-apical inferior, and apex appear severely hypokinetic. The left ventricular internal cavity size was mildly dilated. There is moderate asymmetric hypertrophy of the  basal septum (measures 1.5cm). The rest of the LV segments demonstrate mild left ventricular hypertrophy. Left ventricular diastolic parameters are consistent with Grade II diastolic dysfunction (pseudonormalization). Elevated left atrial pressure.  2. Right ventricular systolic function is normal. The right ventricular size is normal. Tricuspid regurgitation signal is inadequate for assessing PA pressure.  3. The mitral valve is grossly normal. Trivial mitral valve regurgitation. No evidence of mitral stenosis.  4. The aortic  valve is tricuspid. There is mild thickening of the aortic valve. Aortic valve regurgitation is trivial. Mild aortic valve sclerosis is present, with no evidence of aortic valve stenosis. Comparison(s): Compared to prior echo report in 2011, the LVEF is now moderately depressed at 30-35% with wall motion abnormalities as described above. FINDINGS  Left Ventricle: Left ventricular ejection fraction, by estimation, is 30 to 35%. The left ventricle has moderately decreased function. The left ventricle demonstrates regional wall motion abnormalities. The mid-to-apical inferoseptal, mid-to-apical anteroseptal, mid-to-apical inferior, and apex appear severely hypokinetic. The left ventricular internal cavity size was mildly dilated. There is moderate asymmetric hypertrophy of the basal septum (measures 1.5cm). The rest of the LV segments demonstrate mild left ventricular hypertrophy. Left ventricular diastolic parameters are consistent with Grade II diastolic dysfunction (pseudonormalization). Elevated left atrial pressure. Right Ventricle: The right ventricular size is normal. No increase in right ventricular wall thickness. Right ventricular systolic function is normal. Tricuspid regurgitation signal is inadequate for assessing PA pressure. Left Atrium: Left atrial size was normal in size. Right Atrium: Right atrial size was normal in size. Pericardium: There is no evidence of pericardial  effusion. Mitral Valve: The mitral valve is grossly normal. There is mild thickening of the mitral valve leaflet(s). There is mild calcification of the mitral valve leaflet(s). Mild to moderate mitral annular calcification. Trivial mitral valve regurgitation. No evidence of mitral valve stenosis. Tricuspid Valve: The tricuspid valve is normal in structure. Tricuspid valve regurgitation is trivial. Aortic Valve: The aortic valve is tricuspid. There is mild thickening of the aortic valve. Aortic valve regurgitation is trivial. Mild aortic valve sclerosis is present, with no evidence of aortic valve stenosis. Pulmonic Valve: The pulmonic valve was normal in structure. Pulmonic valve regurgitation is trivial. Aorta: The aortic root and ascending aorta are structurally normal, with no evidence of dilitation. Venous: The inferior vena cava was not well visualized. IAS/Shunts: No atrial level shunt detected by color flow Doppler.  LEFT VENTRICLE PLAX 2D LVIDd:         6.30 cm      Diastology LVIDs:         4.70 cm      LV e' medial:    4.90 cm/s LV PW:         1.10 cm      LV E/e' medial:  20.1 LV IVS:        1.50 cm      LV e' lateral:   5.11 cm/s LVOT diam:     2.00 cm      LV E/e' lateral: 19.3 LV SV:         67 LV SV Index:   31 LVOT Area:     3.14 cm  LV Volumes (MOD) LV vol d, MOD A2C: 148.0 ml LV vol d, MOD A4C: 151.0 ml LV vol s, MOD A2C: 91.7 ml LV vol s, MOD A4C: 106.0 ml LV SV MOD A2C:     56.3 ml LV SV MOD A4C:     151.0 ml LV SV MOD BP:      53.5 ml RIGHT VENTRICLE RV S prime:     14.10 cm/s TAPSE (M-mode): 2.1 cm LEFT ATRIUM             Index       RIGHT ATRIUM           Index LA diam:        4.80 cm 2.26 cm/m  RA Area:     14.60  cm LA Vol (A2C):   57.1 ml 26.84 ml/m RA Volume:   35.60 ml  16.73 ml/m LA Vol (A4C):   71.8 ml 33.74 ml/m LA Biplane Vol: 69.1 ml 32.48 ml/m  AORTIC VALVE LVOT Vmax:   94.90 cm/s LVOT Vmean:  60.600 cm/s LVOT VTI:    0.213 m  AORTA Ao Root diam: 3.20 cm Ao Asc diam:  2.90 cm  MITRAL VALVE MV Area (PHT): 3.99 cm     SHUNTS MV Decel Time: 190 msec     Systemic VTI:  0.21 m MV E velocity: 98.50 cm/s   Systemic Diam: 2.00 cm MV A velocity: 108.00 cm/s MV E/A ratio:  0.91 Gwyndolyn Kaufman MD Electronically signed by Gwyndolyn Kaufman MD Signature Date/Time: 03/26/2021/4:21:57 PM    Final     Microbiology: Recent Results (from the past 240 hour(s))  SARS CORONAVIRUS 2 (TAT 6-24 HRS) Nasopharyngeal Nasopharyngeal Swab     Status: None   Collection Time: 03/25/21  9:48 PM   Specimen: Nasopharyngeal Swab  Result Value Ref Range Status   SARS Coronavirus 2 NEGATIVE NEGATIVE Final    Comment: (NOTE) SARS-CoV-2 target nucleic acids are NOT DETECTED.  The SARS-CoV-2 RNA is generally detectable in upper and lower respiratory specimens during the acute phase of infection. Negative results do not preclude SARS-CoV-2 infection, do not rule out co-infections with other pathogens, and should not be used as the sole basis for treatment or other patient management decisions. Negative results must be combined with clinical observations, patient history, and epidemiological information. The expected result is Negative.  Fact Sheet for Patients: SugarRoll.be  Fact Sheet for Healthcare Providers: https://www.woods-mathews.com/  This test is not yet approved or cleared by the [place] FDA and  has been authorized for detection and/or diagnosis of SARS-CoV-2 by FDA under an Emergency Use Authorization (EUA). This EUA will remain  in effect (meaning this test can be used) for the duration of the COVID-19 declaration under Se ction 564(b)(1) of the Act, 21 U.S.C. section 360bbb-3(b)(1), unless the authorization is terminated or revoked sooner.  Performed at [facility], [place] [address], [place], [place] [address]      Labs: Basic Metabolic Panel: Recent Labs  Lab 03/27/21 0331 03/27/21 1500 03/28/21 0235 03/29/21 0746  03/29/21 0946 03/30/21 0411 03/31/21 0231  NA 139  --  139 139 141  142 137 136  K 3.4*  --  3.0* 4.0 3.9  3.9 4.1 3.5  CL 102  --  101 105  --  103 101  CO2 28  --  29 25  --  26 26  GLUCOSE 130*  --  71 142*  --  140* 118*  BUN 20  --  26* 26*  --  28* 30*  CREATININE 1.26*  --  1.35* 1.30*  --  1.26* 1.40*  CALCIUM 8.9  --  9.0 9.4  --  8.9 9.1  MG  --  2.2 2.6*  --   --   --   --    Liver Function Tests: Recent Labs  Lab 03/25/21 2206  AST 20  ALT 28  ALKPHOS 33*  BILITOT 1.1  PROT 6.4*  ALBUMIN 3.9   No results for input(s): LIPASE, AMYLASE in the last 168 hours. No results for input(s): AMMONIA in the last 168 hours. CBC: Recent Labs  Lab 03/25/21 2206 03/25/21 2227 03/26/21 0326 03/29/21 0746 03/29/21 0946  WBC 10.4  --  9.1 7.5  --   NEUTROABS 8.0*  --   --  4.5  --   HGB 13.5 12.6* 13.2 15.5 13.6  13.9  HCT 38.4* 37.0* 36.9* 43.1 40.0  41.0  MCV 91.6  --  89.8 89.0  --   PLT 172  --  184 202  --    Cardiac Enzymes: No results for input(s): CKTOTAL, CKMB, CKMBINDEX, TROPONINI in the last 168 hours. BNP: BNP (last 3 results) Recent Labs    03/26/21 1830 03/27/21 0331 03/28/21 0235  BNP 193.5* 107.5* 65.0    ProBNP (last 3 results) No results for input(s): PROBNP in the last 8760 hours.  CBG: Recent Labs  Lab 03/30/21 0520 03/30/21 1144 03/30/21 1524 03/30/21 2142 03/31/21 0600  GLUCAP 157* 285* 248* 163* 131*       Signed:  Oswald Hillock MD.  [facility] 03/31/2021, 12:04 PM

## 2021-04-06 ENCOUNTER — Encounter: Payer: Medicare Other | Admitting: Adult Health Nurse Practitioner

## 2021-04-08 ENCOUNTER — Encounter: Payer: Medicare Other | Attending: "Endocrinology | Admitting: Dietician

## 2021-04-08 ENCOUNTER — Other Ambulatory Visit: Payer: Self-pay

## 2021-04-08 ENCOUNTER — Encounter: Payer: Self-pay | Admitting: Dietician

## 2021-04-08 DIAGNOSIS — Z794 Long term (current) use of insulin: Secondary | ICD-10-CM | POA: Diagnosis present

## 2021-04-08 DIAGNOSIS — E0822 Diabetes mellitus due to underlying condition with diabetic chronic kidney disease: Secondary | ICD-10-CM | POA: Diagnosis not present

## 2021-04-08 DIAGNOSIS — N182 Chronic kidney disease, stage 2 (mild): Secondary | ICD-10-CM | POA: Diagnosis present

## 2021-04-08 NOTE — Progress Notes (Signed)
Diabetes Self-Management Education  Visit Type: Follow-up  Appt. Start Time: 0910 Appt. End Time: 0940  04/08/2021  [name], identified by name and date of birth, is a 75 y.o. male with a diagnosis of Diabetes:  .   ASSESSMENT Patient is here today with his Ex wife.  He was last seen by this RD 01/04/2021. He was recently admitted to the hospital with Pulmonary Edema and CHF.  Diet is high in sodium but uses no added salt due to processed meat and sauerkraut.  He states that his weight is up 3 lbs in the past 24 hours.  Complains that his urine is dark. His Ex wife is cooking and patient is not eating out currently.  His ex wife states that patient is not happy about this as he enjoys eating out. Patient complains of increased stress due to family issues (granddaughter).   His sleep continues to be poor due to waking every 30 minutes to urinate as well as increased stress. Fasting glucose was 232 this am.  Usually 140-180.  History includes Type 2 Diabetes (1989), GERD, diabetic retinopathy, HDL, HTN, vitamin D deficiency, HTN, OSA (no c-pap), bilayeral cardiomyopathy, hemochromatosis. Labs noted to include:  eGFR 69, vitamin B-12 327, vitamin D, A1C 10% 09/16/2020 increased from 8.7% 07/06/2020. Medication includes:  Lasix, Farxiga, vitamin D, glipizide, Metformin, 70/30 insulin (45 units 5-6 am, 40 units about noon, 22 units before bed. Sleep:  Poor, difficulty going to sleep and up multiple times per night (increased urination is one cause).  Weight hx: 214 lbs 04/08/2021 (210 lbs at home this am) 207 lbs at home 04/07/2021 222 lbs 11/23/2020   Patient lives alone.  Ex wife currently is there and has been helping patient.  He does his own shopping. He does not cook and eats out frequently.  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  He moved here about 14 years ago to be closer to his family.  He is originally from [place].  He states that he reads English a  little.  Weight 214 lb (97.1 kg). Body mass index is 35.61 kg/m.   Diabetes Self-Management Education - 04/08/21 1109       Visit Information   Visit Type Follow-up      Psychosocial Assessment   Patient Belief/Attitude about Diabetes Defeat/Burnout    Self-care barriers English as a second language    Self-management support Doctor's office    Other persons present Patient;Family Member    Patient Concerns Nutrition/Meal planning    Learning Readiness Ready      Pre-Education Assessment   Patient understands the diabetes disease and treatment process. Demonstrates understanding / competency    Patient understands incorporating nutritional management into lifestyle. Needs Review    Patient undertands incorporating physical activity into lifestyle. Needs Review    Patient understands using medications safely. Needs Review    Patient understands monitoring blood glucose, interpreting and using results Demonstrates understanding / competency    Patient understands prevention, detection, and treatment of acute complications. Demonstrates understanding / competency    Patient understands prevention, detection, and treatment of chronic complications. Demonstrates understanding / competency    Patient understands how to develop strategies to address psychosocial issues. Needs Review    Patient understands how to develop strategies to promote health/change behavior. Needs Review      Complications   How often do you check your blood sugar? 1-2 times/day    Fasting Blood glucose range (mg/dL) 130-179;180-200;>200  Dietary Intake   Breakfast 2 eggs, 4 strips [place] bacon    Lunch checken, baked potato, banana    Dinner sauerkraut, 1 sausage, 1 slice [employer] bread, Strawberries banana    Snack (evening) fuit at times.  "always hungry"    Beverage(s) water, coffee with half and half, 2% milk, diet Pepsi      Exercise   Exercise Type ADL's      Patient Education   Previous Diabetes  Education Yes (please comment)   01/2021   Nutrition management  Role of diet in the treatment of diabetes and the relationship between the three main macronutrients and blood glucose level;Meal options for control of blood glucose level and chronic complications.   low sodium   Physical activity and exercise  Role of exercise on diabetes management, blood pressure control and cardiac health.;Helped patient identify appropriate exercises in relation to his/her diabetes, diabetes complications and other health issue.    Medications Reviewed patients medication for diabetes, action, purpose, timing of dose and side effects.    Psychosocial adjustment Identified and addressed patients feelings and concerns about diabetes      Individualized Goals (developed by patient)   Nutrition General guidelines for healthy choices and portions discussed    Physical Activity Exercise 3-5 times per week;15 minutes per day    Medications take my medication as prescribed    Monitoring  test my blood glucose as discussed    Health Coping discuss diabetes with (comment)      Patient Self-Evaluation of Goals - Patient rates self as meeting previously set goals (% of time)   Nutrition 50 - 75 %    Physical Activity 25 - 50%    Medications >75%    Monitoring >75%    Problem Solving 50 - 75 %    Reducing Risk 50 - 75 %    Health Coping >75%      Post-Education Assessment   Patient understands the diabetes disease and treatment process. Demonstrates understanding / competency    Patient understands incorporating nutritional management into lifestyle. Needs Review    Patient undertands incorporating physical activity into lifestyle. Needs Review    Patient understands using medications safely. Needs Review    Patient understands monitoring blood glucose, interpreting and using results Needs Review    Patient understands prevention, detection, and treatment of acute complications. Demonstrates understanding /  competency    Patient understands prevention, detection, and treatment of chronic complications. Demonstrates understanding / competency    Patient understands how to develop strategies to address psychosocial issues. Needs Review    Patient understands how to develop strategies to promote health/change behavior. Needs Review      Outcomes   Expected Outcomes Demonstrated interest in learning. Expect positive outcomes    Future DMSE 3-4 months    Program Status Not Completed      Subsequent Visit   Since your last visit have you continued or begun to take your medications as prescribed? Yes    Since your last visit have you experienced any weight changes? Loss    Weight Loss (lbs) 10    Since your last visit, are you checking your blood glucose at least once a day? Yes             Individualized Plan for Diabetes Self-Management Training:   Learning Objective:  Patient will have a greater understanding of diabetes self-management. Patient education plan is to attend individual and/or group sessions per assessed needs and concerns.  Plan:   Patient Instructions  Continue to read labels for sodium.  Avoid processed meats  Sauerkraut is also high in sodium. Continue to avoid added salt.  Eat more vegetables and beans.  These can help you feel full.  Walk each morning after breakfast for 20 minutes if you are able.  Start small (5 minutes for 1 week and slowly increase).  Choose a cool time of the day.   Expected Outcomes:  Demonstrated interest in learning. Expect positive outcomes  Education material provided: Meal plan card and My Plate; Heart healthy carbohydrate consistent nutrition therapy from [facility]  If problems or questions, patient to contact team via:  Phone  Future DSME appointment: 3-4 months

## 2021-04-08 NOTE — Patient Instructions (Addendum)
Continue to read labels for sodium.  Avoid processed meats  Sauerkraut is also high in sodium. Continue to avoid added salt.  Eat more vegetables and beans.  These can help you feel full.  Walk each morning after breakfast for 20 minutes if you are able.  Start small (5 minutes for 1 week and slowly increase).  Choose a cool time of the day.

## 2021-04-11 NOTE — Progress Notes (Signed)
Primary Physician/Referring:  Azzie Glatter, FNP (Inactive)  Patient ID: Brett [name], male    DOB: [birth year], 75 y.o.   MRN: [id number]  Chief Complaint  Patient presents with   Heart Failure    Hospitalization Follow-up   HPI:    Brett [name]  is a 75 y.o. Caucasian male with hereditary hemochromatosis, hypertension, type 2 diabetes mellitus, untreated OSA, former >40-pack-year smoker, with new diagnosis of acute HFrEF, nonischemic cardiomyopathy when he presented to [facility] with acute pulmonary edema and acute on chronic combined systolic and diastolic heart failure.   Patient diuresed well and cardiac cath revealed non-obstructive CAD. Patients guideline directed medical therapy was uptitrated and he was discharged. Also during hospitalization MRI was negative for cardiac hematochromatosis.   Patient now presents for follow up.  Since discharge patient has been feeling well without specific complaints today.  He has had no recurrence of dyspnea or chest pain.  He is currently taking Lasix 40 mg once daily.  Unfortunately patient has not established with PCP, however his wife who is present at today's visit reports they are working on getting him established.  Patient has not done much physical activity since discharge.  He denies chest pain, palpitations, syncope, near syncope, dizziness, orthopnea, PND, leg swelling.  He has also made significant diet modifications, specifically reducing salt intake.  Past Medical History:  Diagnosis Date   Acid reflux    Allergy    Colon polyp    Diabetes mellitus without complication ([place])    Dilated cardiomyopathy (HCC)    non isch   Gout    Gout    Hemochromatosis    Hypertension    OSA (obstructive sleep apnea)    Urine frequency    Past Surgical History:  Procedure Laterality Date   EYE SURGERY Bilateral    Cataract   RIGHT/LEFT HEART CATH AND CORONARY ANGIOGRAPHY N/A 03/29/2021   Procedure: RIGHT/LEFT HEART CATH AND  CORONARY ANGIOGRAPHY;  Surgeon: Nigel Mormon, MD;  Location: [facility];  Service: Cardiovascular;  Laterality: N/A;   Family History  Problem Relation Age of Onset   Diabetes Father    Diabetes Other    Hypertension Other    Hyperlipidemia Other    Heart disease Other    Cancer Maternal Uncle        brain   Heart attack Brother        76   Diabetes Brother     Social History   Tobacco Use   Smoking status: Former    Packs/day: 3.00    Years: 19.00    Pack years: 57.00    Types: Cigarettes    Start date: 10/24/1957    Quit date: 11/24/1976    Years since quitting: 44.4   Smokeless tobacco: Never  Substance Use Topics   Alcohol use: Yes    Comment: Occasionally   Marital Status: Divorced   ROS  Review of Systems  Constitutional: Negative for malaise/fatigue and weight gain.  Cardiovascular:  Negative for chest pain, claudication, leg swelling, near-syncope, orthopnea, palpitations, paroxysmal nocturnal dyspnea and syncope.  Respiratory:  Negative for shortness of breath.   Neurological:  Negative for dizziness.   Objective  Blood pressure 118/79, pulse 71, temperature 98 F (36.7 C), height 5\' 5"  (1.651 m), weight 212 lb (96.2 kg), SpO2 99 %.  Vitals with BMI 04/12/2021 04/08/2021 03/31/2021  Height 5\' 5"  - -  Weight 212 lbs 214 lbs -  BMI 35.28 35.61 -  Systolic 347 - 425  Diastolic 79 - 65  Pulse 71 - 56      Physical Exam Vitals reviewed.  Constitutional:      Appearance: He is obese.  HENT:     Head: Normocephalic and atraumatic.  Cardiovascular:     Rate and Rhythm: Normal rate and regular rhythm.     Pulses: Intact distal pulses.     Heart sounds: S1 normal and S2 normal. No murmur heard.   No gallop.  Pulmonary:     Effort: Pulmonary effort is normal. No respiratory distress.     Breath sounds: No wheezing, rhonchi or rales.  Musculoskeletal:     Right lower leg: No edema.     Left lower leg: No edema.  Neurological:     Mental  Status: He is alert.    Laboratory examination:   Recent Labs    07/06/20 1429 09/16/20 1127 03/25/21 2206 03/29/21 0746 03/29/21 0946 03/30/21 0411 03/31/21 0231  NA 138 138   < > 139 141  142 137 136  K 3.9 4.0   < > 4.0 3.9  3.9 4.1 3.5  CL 100 100   < > 105  --  103 101  CO2 29 23   < > 25  --  26 26  GLUCOSE 143* 285*   < > 142*  --  140* 118*  BUN 27* 20   < > 26*  --  28* 30*  CREATININE 1.15 1.06   < > 1.30*  --  1.26* 1.40*  CALCIUM 9.5 9.7   < > 9.4  --  8.9 9.1  GFRNONAA 63 69   < > 58*  --  60* 53*  GFRAA 73 80  --   --   --   --   --    < > = values in this interval not displayed.   estimated creatinine clearance is 49.4 mL/min (A) (by C-G formula based on SCr of 1.4 mg/dL (H)).  CMP Latest Ref Rng & Units 03/31/2021 03/30/2021 03/29/2021  Glucose 70 - 99 mg/dL 118(H) 140(H) -  BUN 8 - 23 mg/dL 30(H) 28(H) -  Creatinine 0.61 - 1.24 mg/dL 1.40(H) 1.26(H) -  Sodium 135 - 145 mmol/L 136 137 141  Potassium 3.5 - 5.1 mmol/L 3.5 4.1 3.9  Chloride 98 - 111 mmol/L 101 103 -  CO2 22 - 32 mmol/L 26 26 -  Calcium 8.9 - 10.3 mg/dL 9.1 8.9 -  Total Protein 6.5 - 8.1 g/dL - - -  Total Bilirubin 0.3 - 1.2 mg/dL - - -  Alkaline Phos 38 - 126 U/L - - -  AST 15 - 41 U/L - - -  ALT 0 - 44 U/L - - -   CBC Latest Ref Rng & Units 03/29/2021 03/29/2021 03/29/2021  WBC 4.0 - 10.5 K/uL - - 7.5  Hemoglobin 13.0 - 17.0 g/dL 13.6 13.9 15.5  Hematocrit 39.0 - 52.0 % 40.0 41.0 43.1  Platelets 150 - 400 K/uL - - 202    Lipid Panel Recent Labs    07/06/20 1429 03/26/21 0326  CHOL 115 111  TRIG 162* 42  LDLCALC 56 64  VLDL  --  8  HDL 35* 39*  CHOLHDL 3.3 2.8    HEMOGLOBIN A1C Lab Results  Component Value Date   HGBA1C 7.6 (H) 03/26/2021   MPG 171 03/26/2021   TSH Recent Labs    07/06/20 1429 09/16/20 1127 03/26/21 1811  TSH 2.91 4.820* 3.764  External labs:   None   Allergies  No Known Allergies    Medications Prior to Visit:   Outpatient Medications Prior  to Visit  Medication Sig Dispense Refill   allopurinol (ZYLOPRIM) 300 MG tablet TAKE 1 TABLET BY MOUTH  DAILY TO PREVENT GOUT (Patient taking differently: Take 300 mg by mouth daily.) 90 tablet 3   aspirin 81 MG tablet Take 81 mg by mouth daily.     atorvastatin (LIPITOR) 20 MG tablet TAKE 1 TABLET BY MOUTH  DAILY FOR CHOLESTEROL (Patient taking differently: Take 20 mg by mouth daily.) 90 tablet 3   bisoprolol (ZEBETA) 10 MG tablet Take 1 tablet (10 mg total) by mouth daily. 30 tablet 2   Blood Glucose Monitoring Suppl DEVI Check blood sugar 3 times a day-DX-E11.22 1 each 0   Cholecalciferol (VITAMIN D PO) Take 5,000 Units by mouth daily.     D-Mannose 500 MG CAPS Take 1,500 mg by mouth daily.     famotidine (PEPCID) 40 MG tablet Take 40 mg by mouth daily.     glipiZIDE (GLUCOTROL) 10 MG tablet Take 1 tablet (10 mg total) by mouth daily before breakfast. 30 tablet 1   glucose blood (IGLUCOSE TEST STRIPS) test strip Check blood sugar 3 times daily-DX-E11.22 300 each 12   insulin isophane & regular human (HUMULIN 70/30 MIX) (70-30) 100 UNIT/ML KwikPen Inject into subcutaneous skin 53min before meals, 45units breakfast, 40units lunch and 22units dinner. (Patient taking differently: Inject into subcutaneous skin 4min before meals, 45units breakfast, 25 units lunch and 20units dinner.) 135 mL 4   Insulin Pen Needle 31G X 8 MM MISC Use one three times a day and as needed [place] 1000 each 3   Lancets Ultra Fine MISC Check blood sugar 3 times a day-E11.22 300 each 3   Loratadine (CLARITIN PO) Take 1 tablet by mouth daily.     magnesium oxide (MAG-OX) 400 MG tablet Take 400 mg by mouth daily.     Melatonin 10 MG CAPS Take 10 mg by mouth at bedtime as needed (sleep).     metFORMIN (GLUCOPHAGE-XR) 500 MG 24 hr tablet Take    2 tablets    2 x /day    With Meals for Diabetes (Patient taking differently: Take 1,000 mg by mouth 2 (two) times daily.) 360 tablet 3   sacubitril-valsartan (ENTRESTO) 49-51 MG Take 1  tablet by mouth 2 (two) times daily. 60 tablet 2   spironolactone (ALDACTONE) 25 MG tablet Take 1 tablet (25 mg total) by mouth daily. 30 tablet 2   tamsulosin (FLOMAX) 0.4 MG CAPS capsule TAKE 1 CAPSULE BY MOUTH AT  BEDTIME FOR PROSTATE (Patient taking differently: Take 0.4 mg by mouth at bedtime.) 90 capsule 3   furosemide (LASIX) 40 MG tablet TAKE 1 TABLET BY MOUTH  DAILY FOR BP, / FLUID  RETENTION / ANKLE SWELLING (Patient taking differently: Take 40 mg by mouth daily.) 90 tablet 3   tadalafil (CIALIS) 20 MG tablet Take 1/2 to 1  tablet every 2 to 3 days if needed for XXXX (Patient not taking: Reported on 04/12/2021) 30 tablet 3   dapagliflozin propanediol (FARXIGA) 10 MG TABS tablet Take 1 tablet (10 mg total) by mouth daily. 30 tablet 2   montelukast (SINGULAIR) 10 MG tablet TAKE 1 TABLET BY MOUTH  DAILY FOR ALLERGIES (Patient not taking: Reported on 04/12/2021) 90 tablet 3   No facility-administered medications prior to visit.     Final Medications at End of Visit  Current Meds  Medication Sig   allopurinol (ZYLOPRIM) 300 MG tablet TAKE 1 TABLET BY MOUTH  DAILY TO PREVENT GOUT (Patient taking differently: Take 300 mg by mouth daily.)   aspirin 81 MG tablet Take 81 mg by mouth daily.   atorvastatin (LIPITOR) 20 MG tablet TAKE 1 TABLET BY MOUTH  DAILY FOR CHOLESTEROL (Patient taking differently: Take 20 mg by mouth daily.)   bisoprolol (ZEBETA) 10 MG tablet Take 1 tablet (10 mg total) by mouth daily.   Blood Glucose Monitoring Suppl DEVI Check blood sugar 3 times a day-DX-E11.22   Cholecalciferol (VITAMIN D PO) Take 5,000 Units by mouth daily.   D-Mannose 500 MG CAPS Take 1,500 mg by mouth daily.   famotidine (PEPCID) 40 MG tablet Take 40 mg by mouth daily.   glipiZIDE (GLUCOTROL) 10 MG tablet Take 1 tablet (10 mg total) by mouth daily before breakfast.   glucose blood (IGLUCOSE TEST STRIPS) test strip Check blood sugar 3 times daily-DX-E11.22   insulin isophane & regular human  (HUMULIN 70/30 MIX) (70-30) 100 UNIT/ML KwikPen Inject into subcutaneous skin 24min before meals, 45units breakfast, 40units lunch and 22units dinner. (Patient taking differently: Inject into subcutaneous skin 51min before meals, 45units breakfast, 25 units lunch and 20units dinner.)   Insulin Pen Needle 31G X 8 MM MISC Use one three times a day and as needed [place]   Lancets Ultra Fine MISC Check blood sugar 3 times a day-E11.22   Loratadine (CLARITIN PO) Take 1 tablet by mouth daily.   magnesium oxide (MAG-OX) 400 MG tablet Take 400 mg by mouth daily.   Melatonin 10 MG CAPS Take 10 mg by mouth at bedtime as needed (sleep).   metFORMIN (GLUCOPHAGE-XR) 500 MG 24 hr tablet Take    2 tablets    2 x /day    With Meals for Diabetes (Patient taking differently: Take 1,000 mg by mouth 2 (two) times daily.)   sacubitril-valsartan (ENTRESTO) 49-51 MG Take 1 tablet by mouth 2 (two) times daily.   spironolactone (ALDACTONE) 25 MG tablet Take 1 tablet (25 mg total) by mouth daily.   tamsulosin (FLOMAX) 0.4 MG CAPS capsule TAKE 1 CAPSULE BY MOUTH AT  BEDTIME FOR PROSTATE (Patient taking differently: Take 0.4 mg by mouth at bedtime.)   [DISCONTINUED] furosemide (LASIX) 40 MG tablet TAKE 1 TABLET BY MOUTH  DAILY FOR BP, / FLUID  RETENTION / ANKLE SWELLING (Patient taking differently: Take 40 mg by mouth daily.)     Radiology:   No results found. Chest x-ray 03/25/2021: PORTABLE CHEST 1 VIEW COMPARISON:  Chest radiograph 09/11/2014   FINDINGS: The heart is enlarged. Mild to moderate pulmonary edema. Trace fluid in the right minor fissure. No large subpulmonic effusion. Bibasilar atelectasis without confluent airspace disease. No pneumothorax. No acute osseous abnormalities are seen.   IMPRESSION: CHF.  Cardiomegaly with pulmonary edema and fluid in the fissures.  Cardiac MRI 03/30/2021:  LV function is diminished. Parametric mapping is not consistent with cardiac hereditary hemachromatosis.  Cardiac  Studies:  Left and right heart catheterization 03/29/2021: LM: Normal LAD: Minimal luminal irregularities. Mild calcification. LCx: Normal [place]: Minimal luminal irregularities. RCA: Minimal luminal irregularities. Mild calcification. Normal filling pressures   Compensated nonischemic cardiomyopathy   Recommend cardiac MRI given known hereditary hemochromatosis Continue guideline directed medical therapy for HFrEF  Echocardiogram 03/26/2021:  1. Left ventricular ejection fraction, by estimation, is 30 to 35%. The  left ventricle has moderately decreased function. The left ventricle  demonstrates regional wall motion abnormalities (see  scoring  diagram/findings for description). The  mid-to-apical inferoseptal, mid-to-apical anteroseptal, mid-to-apical  inferior, and apex appear severely hypokinetic. The left ventricular  internal cavity size was mildly dilated. There is moderate asymmetric  hypertrophy of the basal septum (measures  1.5cm). The rest of the LV segments demonstrate mild left ventricular  hypertrophy. Left ventricular diastolic parameters are consistent with  Grade II diastolic dysfunction (pseudonormalization). Elevated left atrial  pressure.   2. Right ventricular systolic function is normal. The right ventricular  size is normal. Tricuspid regurgitation signal is inadequate for assessing  PA pressure.   3. The mitral valve is grossly normal. Trivial mitral valve  regurgitation. No evidence of mitral stenosis.   4. The aortic valve is tricuspid. There is mild thickening of the aortic  valve. Aortic valve regurgitation is trivial. Mild aortic valve sclerosis  is present, with no evidence of aortic valve stenosis.   Comparison(s): Compared to prior echo report in 2011, the LVEF is now  moderately depressed at 30-35% with wall motion abnormalities as described  above.  EKG:   04/12/2021: Sinus rhythm at a rate of 71 bpm.  Left bundle branch block, no further  analysis.  03/25/2021: Sinus rhythm 85 bpm Left bundle branch block   Assessment     ICD-10-CM   1. Nonischemic cardiomyopathy (HCC)  I42.8 EKG [id number]    Basic metabolic panel    2. HFrEF (heart failure with reduced ejection fraction) (HCC)  I50.20 EKG 41-PFXT    Basic metabolic panel    3. Essential hypertension  K24 Basic metabolic panel    4. OSA (obstructive sleep apnea)  G47.33     5. Hereditary hemochromatosis ([place])  E83.110        Medications Discontinued During This Encounter  Medication Reason   montelukast (SINGULAIR) 10 MG tablet Error   furosemide (LASIX) 40 MG tablet    dapagliflozin propanediol (FARXIGA) 10 MG TABS tablet Reorder    Meds ordered this encounter  Medications   furosemide (LASIX) 20 MG tablet    Sig: Take 1 tablet (20 mg total) by mouth daily. TAKE 1 TABLET BY MOUTH  DAILY FOR BP, / FLUID  RETENTION / ANKLE SWELLING    Dispense:  90 tablet    Refill:  3    Requesting 1 year supply   dapagliflozin propanediol (FARXIGA) 10 MG TABS tablet    Sig: Take 1 tablet (10 mg total) by mouth daily.    Dispense:  90 tablet    Refill:  3    Recommendations:   Kentavious J Madding is a 75 y.o. Caucasian male with hereditary hemochromatosis, hypertension, type 2 diabetes mellitus, untreated OSA, former >40-pack-year smoker, with new diagnosis of acute HFrEF, nonischemic cardiomyopathy when he presented to [facility] with acute pulmonary edema and acute on chronic combined systolic and diastolic heart failure.   Patient diuresed well and cardiac cath revealed non-obstructive CAD. Patients guideline directed medical therapy was uptitrated and he was discharged. Also during hospitalization MRI was negative for cardiac hematochromatosis.   Patient now presents for follow up.  Patient is doing well, relatively asymptomatic and tolerating guideline directed therapy without issue.  There are no clinical signs of heart failure at today's visit.  We will continue guideline  directed medical therapy for both CAD and HFrEF including the following: Aspirin, atorvastatin, bisoprolol, Farxiga, Entresto, spironolactone.  Patient is euvolemic on exam today, therefore advised him to reduce furosemide from 40 mg to 20 mg daily with additional 20 mg as needed  for swelling or shortness of breath.  Patient and his wife both verbalized understanding of furosemide dosing.  Patient's blood pressure and heart rate are both well controlled.  Encouraged patient to increase his physical activity slowly.  Advised him that I would recommend cardiac rehab, however patient prefers to increase physical activity on his own.  I have also counseled him regarding diet and lifestyle modifications, including weight loss in order to reduce cardiovascular risk.  I will obtain repeat BMP as he has not had 1 since discharge from the hospital.  Follow-up in 1 month, sooner if needed, for heart failure, CAD, hypertension.  During this visit I reviewed and updated: Tobacco history  allergies medication reconciliation  medical history  surgical history  family history  social history.  This note was created using a voice recognition software as a result there may be grammatical errors inadvertently enclosed that do not reflect the nature of this encounter. Every attempt is made to correct such errors.   Alethia Berthold, PA-C 04/13/2021, 7:00 AM Office: [phone]

## 2021-04-12 ENCOUNTER — Encounter: Payer: Self-pay | Admitting: Student

## 2021-04-12 ENCOUNTER — Other Ambulatory Visit: Payer: Self-pay

## 2021-04-12 ENCOUNTER — Ambulatory Visit: Payer: Medicare Other | Admitting: Student

## 2021-04-12 VITALS — BP 118/79 | HR 71 | Temp 98.0°F | Ht 65.0 in | Wt 212.0 lb

## 2021-04-12 DIAGNOSIS — I10 Essential (primary) hypertension: Secondary | ICD-10-CM

## 2021-04-12 DIAGNOSIS — I5020 Unspecified systolic (congestive) heart failure: Secondary | ICD-10-CM | POA: Diagnosis not present

## 2021-04-12 DIAGNOSIS — I428 Other cardiomyopathies: Secondary | ICD-10-CM

## 2021-04-12 DIAGNOSIS — G4733 Obstructive sleep apnea (adult) (pediatric): Secondary | ICD-10-CM | POA: Diagnosis not present

## 2021-04-12 MED ORDER — DAPAGLIFLOZIN PROPANEDIOL 10 MG PO TABS: 10.0000 mg | ORAL_TABLET | Freq: Every day | ORAL | 3 refills | Status: DC

## 2021-04-12 MED ORDER — FUROSEMIDE 20 MG PO TABS: 20.0000 mg | ORAL_TABLET | Freq: Every day | ORAL | 3 refills | Status: DC

## 2021-04-13 ENCOUNTER — Encounter (INDEPENDENT_AMBULATORY_CARE_PROVIDER_SITE_OTHER): Payer: Medicare Other | Admitting: Ophthalmology

## 2021-04-13 ENCOUNTER — Encounter: Payer: Self-pay | Admitting: Internal Medicine

## 2021-04-13 DIAGNOSIS — H35033 Hypertensive retinopathy, bilateral: Secondary | ICD-10-CM

## 2021-04-13 DIAGNOSIS — I10 Essential (primary) hypertension: Secondary | ICD-10-CM

## 2021-04-13 DIAGNOSIS — E113292 Type 2 diabetes mellitus with mild nonproliferative diabetic retinopathy without macular edema, left eye: Secondary | ICD-10-CM

## 2021-04-13 DIAGNOSIS — H43813 Vitreous degeneration, bilateral: Secondary | ICD-10-CM | POA: Diagnosis not present

## 2021-04-13 DIAGNOSIS — H35373 Puckering of macula, bilateral: Secondary | ICD-10-CM | POA: Diagnosis not present

## 2021-04-13 DIAGNOSIS — E113391 Type 2 diabetes mellitus with moderate nonproliferative diabetic retinopathy without macular edema, right eye: Secondary | ICD-10-CM

## 2021-04-30 DIAGNOSIS — R350 Frequency of micturition: Secondary | ICD-10-CM | POA: Insufficient documentation

## 2021-04-30 DIAGNOSIS — T7840XA Allergy, unspecified, initial encounter: Secondary | ICD-10-CM | POA: Insufficient documentation

## 2021-04-30 DIAGNOSIS — K635 Polyp of colon: Secondary | ICD-10-CM | POA: Insufficient documentation

## 2021-04-30 DIAGNOSIS — E119 Type 2 diabetes mellitus without complications: Secondary | ICD-10-CM | POA: Insufficient documentation

## 2021-05-03 ENCOUNTER — Inpatient Hospital Stay: Payer: Medicare Other | Attending: Oncology

## 2021-05-03 ENCOUNTER — Other Ambulatory Visit: Payer: Self-pay

## 2021-05-03 ENCOUNTER — Other Ambulatory Visit: Payer: Medicare Other

## 2021-05-03 DIAGNOSIS — D509 Iron deficiency anemia, unspecified: Secondary | ICD-10-CM | POA: Diagnosis not present

## 2021-05-03 LAB — FERRITIN: Ferritin: 38 ng/mL (ref 24–336)

## 2021-05-05 ENCOUNTER — Telehealth: Payer: Self-pay

## 2021-05-05 NOTE — Telephone Encounter (Signed)
-----   Message from [name], MD sent at 05/04/2021  4:59 PM EDT ----- Please call patient, ferritin remains in goal range, follow-up as scheduled

## 2021-05-05 NOTE — Telephone Encounter (Signed)
Pt called message left to return call for most recent lab results  Currently awaiting returh call

## 2021-05-10 ENCOUNTER — Ambulatory Visit (INDEPENDENT_AMBULATORY_CARE_PROVIDER_SITE_OTHER): Payer: Medicare Other | Admitting: Cardiology

## 2021-05-10 ENCOUNTER — Other Ambulatory Visit: Payer: Self-pay

## 2021-05-10 ENCOUNTER — Encounter: Payer: Self-pay | Admitting: Cardiology

## 2021-05-10 VITALS — BP 124/70 | HR 70 | Ht 65.0 in | Wt 213.0 lb

## 2021-05-10 DIAGNOSIS — N182 Chronic kidney disease, stage 2 (mild): Secondary | ICD-10-CM | POA: Diagnosis not present

## 2021-05-10 DIAGNOSIS — I509 Heart failure, unspecified: Secondary | ICD-10-CM | POA: Diagnosis not present

## 2021-05-10 DIAGNOSIS — I429 Cardiomyopathy, unspecified: Secondary | ICD-10-CM

## 2021-05-10 DIAGNOSIS — I447 Left bundle-branch block, unspecified: Secondary | ICD-10-CM | POA: Diagnosis not present

## 2021-05-10 DIAGNOSIS — E0822 Diabetes mellitus due to underlying condition with diabetic chronic kidney disease: Secondary | ICD-10-CM | POA: Diagnosis not present

## 2021-05-10 DIAGNOSIS — I5020 Unspecified systolic (congestive) heart failure: Secondary | ICD-10-CM

## 2021-05-10 DIAGNOSIS — Z794 Long term (current) use of insulin: Secondary | ICD-10-CM | POA: Diagnosis not present

## 2021-05-10 DIAGNOSIS — Z87891 Personal history of nicotine dependence: Secondary | ICD-10-CM | POA: Diagnosis not present

## 2021-05-10 NOTE — Progress Notes (Signed)
Cardiology Consultation:    Date:  05/10/2021   ID:  Brett [name], Brett [name] 05/31/1946, MRN [id number]  PCP:  Azzie Glatter, FNP (Inactive)  Cardiologist:  Jenne Campus, MD   Referring MD: No ref. provider found   Chief Complaint  Patient presents with   Pulmonary Edema    History of Present Illness:    Brett [name] is a 75 y.o. male who is being seen today for the evaluation of cardiomyopathy at the request of No ref. provider found.  [place] is a [place] with this for he decided to see me as his physician.  His past medical history is quite complex and include hereditary hemochromatosis, essential hypertension, type 2 diabetes, untreated obstructive sleep apnea, is a chronic smoker quit smoking years ago smoked more than 40 packs/year, about a month ago he presented to [facility] with shortness of breath.  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  Again he was put on appropriate medication and sent to [place] to be evaluated.  Overall he seems to be doing fine.  He comes to our office with his daughter.  We actually spoke English in the office.  Denies having any chest pain tightness squeezing pressure burning chest.  Shortness of breath is still there but much improved.  He said that he does not do much.  His daughter confirmed that she said for about 6 months to maybe even 2 years he is gradually slowing down.  He also noticed some swelling of lower extremities for the last few weeks however since discharge from the hospital does not have any.  There is no proximal nocturnal dyspnea there is no dizziness no palpitations  Past Medical History:  Diagnosis Date   Acid reflux    Allergy    Colon polyp    Diabetes mellitus without complication (HCC)     Dilated cardiomyopathy (HCC)    non isch   Gout    Gout    Hemochromatosis    Hypertension    OSA (obstructive sleep apnea)    Urine frequency     Past Surgical History:  Procedure Laterality Date   EYE SURGERY Bilateral    Cataract   RIGHT/LEFT HEART CATH AND CORONARY ANGIOGRAPHY N/A 03/29/2021   Procedure: RIGHT/LEFT HEART CATH AND CORONARY ANGIOGRAPHY;  Surgeon: Nigel Mormon, MD;  Location: [facility];  Service: Cardiovascular;  Laterality: N/A;    Current Medications: Current Meds  Medication Sig   allopurinol (ZYLOPRIM) 300 MG tablet TAKE 1 TABLET BY MOUTH  DAILY TO PREVENT GOUT (Patient taking differently: Take 300 mg by mouth daily.)   aspirin 81 MG tablet Take 81 mg by mouth daily.   atorvastatin (LIPITOR) 20 MG tablet TAKE 1 TABLET BY MOUTH  DAILY FOR CHOLESTEROL (Patient taking differently: Take 20 mg by mouth daily.)   bisoprolol (ZEBETA) 10 MG tablet Take 1 tablet (10 mg total) by mouth daily.   Cholecalciferol (VITAMIN D PO) Take 5,000 Units by mouth daily. Unknown strength   D-Mannose 500 MG CAPS Take 1,500 mg by mouth daily.   dapagliflozin propanediol (FARXIGA) 10 MG TABS tablet Take 1 tablet (10 mg total) by mouth daily.   famotidine (PEPCID) 40 MG tablet Take 40 mg by mouth daily.   furosemide (LASIX) 20 MG  tablet Take 1 tablet (20 mg total) by mouth daily. TAKE 1 TABLET BY MOUTH  DAILY FOR BP, / FLUID  RETENTION / ANKLE SWELLING   glipiZIDE (GLUCOTROL) 10 MG tablet Take 1 tablet (10 mg total) by mouth daily before breakfast.   insulin isophane & regular human (HUMULIN 70/30 MIX) (70-30) 100 UNIT/ML KwikPen Inject into subcutaneous skin 7min before meals, 45units breakfast, 40units lunch and 22units dinner. (Patient taking differently: Inject 20-45 Units into the skin in the morning, at noon, and at bedtime. Inject into subcutaneous skin 74min before meals, 45units breakfast, 25 units lunch and 20units dinner.)   Loratadine (CLARITIN PO) Take 1 tablet  by mouth daily. Unknown strength   magnesium oxide (MAG-OX) 400 MG tablet Take 400 mg by mouth daily.   Melatonin 10 MG CAPS Take 10 mg by mouth at bedtime as needed (sleep).   metFORMIN (GLUCOPHAGE-XR) 500 MG 24 hr tablet Take    2 tablets    2 x /day    With Meals for Diabetes (Patient taking differently: Take 1,000 mg by mouth 2 (two) times daily.)   sacubitril-valsartan (ENTRESTO) 49-51 MG Take 1 tablet by mouth 2 (two) times daily.   spironolactone (ALDACTONE) 25 MG tablet Take 1 tablet (25 mg total) by mouth daily.   tamsulosin (FLOMAX) 0.4 MG CAPS capsule TAKE 1 CAPSULE BY MOUTH AT  BEDTIME FOR PROSTATE (Patient taking differently: Take 0.4 mg by mouth at bedtime.)     Allergies:   Patient has no known allergies.   Social History   Socioeconomic History   Marital status: Divorced    Spouse name: Not on file   Number of children: Not on file   Years of education: Not on file   Highest education level: Not on file  Occupational History   Not on file  Tobacco Use   Smoking status: Former    Packs/day: 3.00    Years: 19.00    Pack years: 57.00    Types: Cigarettes    Start date: 10/24/1957    Quit date: 11/24/1976    Years since quitting: 44.4   Smokeless tobacco: Never  Substance and Sexual Activity   Alcohol use: Yes    Comment: Occasionally   Drug use: No   Sexual activity: Not on file  Other Topics Concern   Not on file  Social History Narrative   Pt lives w/ ex wife   Social Determinants of Health   Financial Resource Strain: Not on file  Food Insecurity: Not on file  Transportation Needs: Not on file  Physical Activity: Not on file  Stress: Not on file  Social Connections: Not on file     Family History: The patient's family history includes Cancer in his maternal uncle; Diabetes in his brother, father, and another family member; Heart attack in his brother; Heart disease in an other family member; Hyperlipidemia in an other family member; Hypertension in an  other family member. ROS:   Please see the history of present illness.    All 14 point review of systems negative except as described per history of present illness.  EKGs/Labs/Other Studies Reviewed:    The following studies were reviewed today:  Cardiac catheterization from 03/29/2021 showed:  Left Main  Vessel is normal in caliber. Vessel is angiographically normal.  Left Anterior Descending  Vessel is normal in caliber. The vessel exhibits minimal luminal irregularities. The vessel is mildly calcified.  Ramus Intermedius  Vessel is small. The vessel exhibits minimal luminal irregularities.  Right Coronary Artery  Vessel is normal in caliber. The vessel exhibits minimal luminal irregularities. The vessel is mildly calcified.   Intervention   No interventions have been documented.  Right Heart  Right Heart Pressures RA: 6 mmHg RV: 33/3 mmHg PA: 31/2 mmHg PCW: 11 mmHg         Left Heart  Left Ventricle LV: 130/0 mmHg LVEDP 12 mmhg CO: 4.3 L/min CI: 2.1 L/min/m2    Echocardiogram done on 03/26/2021 showed:    1. Left ventricular ejection fraction, by estimation, is 30 to 35%. The  left ventricle has moderately decreased function. The left ventricle  demonstrates regional wall motion abnormalities (see scoring  diagram/findings for description). The  mid-to-apical inferoseptal, mid-to-apical anteroseptal, mid-to-apical  inferior, and apex appear severely hypokinetic. The left ventricular  internal cavity size was mildly dilated. There is moderate asymmetric  hypertrophy of the basal septum (measures  1.5cm). The rest of the LV segments demonstrate mild left ventricular  hypertrophy. Left ventricular diastolic parameters are consistent with  Grade II diastolic dysfunction (pseudonormalization). Elevated left atrial  pressure.   2. Right ventricular systolic function is normal. The right ventricular  size is normal. Tricuspid regurgitation signal is inadequate  for assessing  PA pressure.   3. The mitral valve is grossly normal. Trivial mitral valve  regurgitation. No evidence of mitral stenosis.   4. The aortic valve is tricuspid. There is mild thickening of the aortic  valve. Aortic valve regurgitation is trivial. Mild aortic valve sclerosis  is present, with no evidence of aortic valve stenosis.   Comparison(s): Compared to prior echo report in 2011, the LVEF is now  moderately depressed at 30-35% with wall motion abnormalities as described  above.   EKG:  EKG is  ordered today.  The ekg ordered today demonstrates normal sinus rhythm normal P interval, left bundle branch block  Recent Labs: 03/25/2021: ALT 28 03/26/2021: TSH 3.764 03/28/2021: B Natriuretic Peptide 65.0; Magnesium 2.6 03/29/2021: Hemoglobin 13.9; Hemoglobin 13.6; Platelets 202 03/31/2021: BUN 30; Creatinine, Ser 1.40; Potassium 3.5; Sodium 136  Recent Lipid Panel    Component Value Date/Time   CHOL 111 03/26/2021 0326   TRIG 42 03/26/2021 0326   HDL 39 (L) 03/26/2021 0326   CHOLHDL 2.8 03/26/2021 0326   VLDL 8 03/26/2021 0326   LDLCALC 64 03/26/2021 0326   LDLCALC 56 07/06/2020 1429    Physical Exam:    VS:  BP 124/70 (BP Location: Right Arm, Patient Position: Sitting)   Pulse 70   Ht 5\' 5"  (1.651 m)   Wt 213 lb (96.6 kg)   SpO2 95%   BMI 35.45 kg/m     Wt Readings from Last 3 Encounters:  05/10/21 213 lb (96.6 kg)  04/12/21 212 lb (96.2 kg)  04/08/21 214 lb (97.1 kg)     GEN:  Well nourished, well developed in no acute distress HEENT: Normal NECK: No JVD; No carotid bruits LYMPHATICS: No lymphadenopathy CARDIAC: RRR, no murmurs, no rubs, no gallops RESPIRATORY:  Clear to auscultation without rales, wheezing or rhonchi  ABDOMEN: Soft, non-tender, non-distended MUSCULOSKELETAL:  No edema; No deformity  SKIN: Warm and dry NEUROLOGIC:  Alert and oriented x 3 PSYCHIATRIC:  Normal affect   ASSESSMENT:    1. Congestive heart failure, unspecified HF  chronicity, unspecified heart failure type ([place])   2. Cardiomyopathy, unspecified type ([place])   3. HFrEF (heart failure with reduced ejection fraction) ([place])   4. LBBB (left bundle branch block)   5. 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 Former smoker   7. Hereditary hemochromatosis ([place])    PLAN:    In order of problems listed above:  Cardiomyopathy which is nonischemic with ejection fraction 30 to 35%.  Cardiac catheterization review showed nonobstructive disease, potentially etiology of this phenomenon could be hemochromatosis however he also got some history of drinking.  He did have an MRI done of his heart does not show any typical features of hemochromatosis, in terms of drinking he stopped couple years ago used to drink especially over the weekend.  He drank vodka.  Luckily he has been put on appropriate medications, overall hemodynamically compensated, will continue with Lasix at 20 mg daily, Chem-7 will be checked today, will continue with spironolactone and Entresto bisoprolol,.  I will refer him also to advanced congestive heart failure clinic Congestive heart failure seems to be compensated, he is New York Heart Association class III right now.  He lives fairly sedentary lifestyle right now. Left bundle branch block.  Noted.  In the future of therapy no improvement left ventricle ejection fraction we will consider BiV pacing.  The plan overall is to continue appropriate medical therapy for at least 2 to 3 months repeat echocardiogram and then make a decision about potentially ICD/BiV. Diabetes that being followed by antimedicine team. Hemochromatosis, that being followed by hematology team, MRI of his heart did not show any typical features of hemochromatosis. Dyslipidemia: Will continue with statin.   Medication Adjustments/Labs and Tests Ordered: Current medicines are reviewed at length  with the patient today.  Concerns regarding medicines are outlined above.  Orders Placed This Encounter  Procedures   Basic metabolic panel   Magnesium   CBC with Differential/Platelet   AMB referral to CHF clinic   EKG 12-Lead   No orders of the defined types were placed in this encounter.   Signed, Park Liter, MD, [facility]. 05/10/2021 12:21 PM    [place]

## 2021-05-10 NOTE — Patient Instructions (Addendum)
Medication Instructions:  Your physician recommends that you continue on your current medications as directed. Please refer to the Current Medication list given to you today.  *If you need a refill on your cardiac medications before your next appointment, please call your pharmacy*   Lab Work: Your physician recommends that you return for lab work in:  TODAY: BMET, [place], CBC If you have labs (blood work) drawn today and your tests are completely normal, you will receive your results only by: MyChart Message (if you have MyChart) OR A paper copy in the mail If you have any lab test that is abnormal or we need to change your treatment, we will call you to review the results.   Testing/Procedures: None   Follow-Up: At [facility], you and your health needs are our priority.  As part of our continuing mission to provide you with exceptional heart care, we have created designated Provider Care Teams.  These Care Teams include your primary Cardiologist (physician) and Advanced Practice Providers (APPs -  Physician Assistants and Nurse Practitioners) who all work together to provide you with the care you need, when you need it.  We recommend signing up for the patient portal called "MyChart".  Sign up information is provided on this After Visit Summary.  MyChart is used to connect with patients for Virtual Visits (Telemedicine).  Patients are able to view lab/test results, encounter notes, upcoming appointments, etc.  Non-urgent messages can be sent to your provider as well.   To learn more about what you can do with MyChart, go to [email or web address].    Your next appointment:   6 week(s)  The format for your next appointment:   In Person  Provider:   Jenne Campus, MD   Other Instructions

## 2021-05-11 LAB — CBC WITH DIFFERENTIAL/PLATELET
Basophils Absolute: 0 10*3/uL (ref 0.0–0.2)
Basos: 1 %
EOS (ABSOLUTE): 0.2 10*3/uL (ref 0.0–0.4)
Eos: 3 %
Hematocrit: 49.1 % (ref 37.5–51.0)
Hemoglobin: 16.6 g/dL (ref 13.0–17.7)
Immature Grans (Abs): 0 10*3/uL (ref 0.0–0.1)
Immature Granulocytes: 1 %
Lymphocytes Absolute: 1.5 10*3/uL (ref 0.7–3.1)
Lymphs: 23 %
MCH: 32 pg (ref 26.6–33.0)
MCHC: 33.8 g/dL (ref 31.5–35.7)
MCV: 95 fL (ref 79–97)
Monocytes Absolute: 0.6 10*3/uL (ref 0.1–0.9)
Monocytes: 9 %
Neutrophils Absolute: 4.1 10*3/uL (ref 1.4–7.0)
Neutrophils: 63 %
Platelets: 188 10*3/uL (ref 150–450)
RBC: 5.18 x10E6/uL (ref 4.14–5.80)
RDW: 13.3 % (ref 11.6–15.4)
WBC: 6.5 10*3/uL (ref 3.4–10.8)

## 2021-05-11 LAB — BASIC METABOLIC PANEL
BUN/Creatinine Ratio: 18 (ref 10–24)
BUN: 24 mg/dL (ref 8–27)
CO2: 25 mmol/L (ref 20–29)
Calcium: 9.2 mg/dL (ref 8.6–10.2)
Chloride: 101 mmol/L (ref 96–106)
Creatinine, Ser: 1.34 mg/dL — ABNORMAL HIGH (ref 0.76–1.27)
Glucose: 306 mg/dL — ABNORMAL HIGH (ref 65–99)
Potassium: 4.7 mmol/L (ref 3.5–5.2)
Sodium: 140 mmol/L (ref 134–144)
eGFR: 56 mL/min/{1.73_m2} — ABNORMAL LOW (ref >59)

## 2021-05-11 LAB — MAGNESIUM: Magnesium: 2.4 mg/dL — ABNORMAL HIGH (ref 1.6–2.3)

## 2021-05-13 ENCOUNTER — Telehealth: Payer: Self-pay

## 2021-05-13 ENCOUNTER — Ambulatory Visit: Payer: Medicare Other | Admitting: Student

## 2021-05-13 NOTE — Telephone Encounter (Signed)
-----   Message from Park Liter, MD sent at 05/11/2021 12:12 PM EDT ----- Labs are looking good, continue present management

## 2021-05-13 NOTE — Telephone Encounter (Signed)
Tried calling patient. No answer and no voicemail set up for me to leave a message. 

## 2021-05-14 ENCOUNTER — Ambulatory Visit: Payer: Medicare Other | Admitting: Student

## 2021-06-01 ENCOUNTER — Other Ambulatory Visit: Payer: Self-pay | Admitting: Internal Medicine

## 2021-06-16 ENCOUNTER — Encounter: Payer: Self-pay | Admitting: Cardiology

## 2021-06-16 ENCOUNTER — Other Ambulatory Visit: Payer: Self-pay

## 2021-06-16 ENCOUNTER — Ambulatory Visit (INDEPENDENT_AMBULATORY_CARE_PROVIDER_SITE_OTHER): Payer: Medicare Other | Admitting: Cardiology

## 2021-06-16 VITALS — BP 112/66 | HR 59 | Ht 65.0 in | Wt 214.0 lb

## 2021-06-16 DIAGNOSIS — I420 Dilated cardiomyopathy: Secondary | ICD-10-CM | POA: Diagnosis not present

## 2021-06-16 DIAGNOSIS — I5020 Unspecified systolic (congestive) heart failure: Secondary | ICD-10-CM | POA: Diagnosis not present

## 2021-06-16 DIAGNOSIS — E119 Type 2 diabetes mellitus without complications: Secondary | ICD-10-CM

## 2021-06-16 NOTE — Patient Instructions (Signed)
Medication Instructions:  Your physician recommends that you continue on your current medications as directed. Please refer to the Current Medication list given to you today.   *If you need a refill on your cardiac medications before your next appointment, please call your pharmacy*   Lab Work: None If you have labs (blood work) drawn today and your tests are completely normal, you will receive your results only by: MyChart Message (if you have MyChart) OR A paper copy in the mail If you have any lab test that is abnormal or we need to change your treatment, we will call you to review the results.   Testing/Procedures: Your physician has requested that you have an echocardiogram. Echocardiography is a painless test that uses sound waves to create images of your heart. It provides your doctor with information about the size and shape of your heart and how well your heart's chambers and valves are working. This procedure takes approximately one hour. There are no restrictions for this procedure.    Follow-Up: At [facility], you and your health needs are our priority.  As part of our continuing mission to provide you with exceptional heart care, we have created designated Provider Care Teams.  These Care Teams include your primary Cardiologist (physician) and Advanced Practice Providers (APPs -  Physician Assistants and Nurse Practitioners) who all work together to provide you with the care you need, when you need it.  We recommend signing up for the patient portal called "MyChart".  Sign up information is provided on this After Visit Summary.  MyChart is used to connect with patients for Virtual Visits (Telemedicine).  Patients are able to view lab/test results, encounter notes, upcoming appointments, etc.  Non-urgent messages can be sent to your provider as well.   To learn more about what you can do with MyChart, go to [email or web address].    Your next appointment:   3  month(s)  The format for your next appointment:   In Person  Provider:   Robert Krasowski, MD   Other Instructions  Echocardiogram An echocardiogram is a test that uses sound waves (ultrasound) to produce images of the heart. Images from an echocardiogram can provide important information about: Heart size and shape. The size and thickness and movement of your heart's walls. Heart muscle function and strength. Heart valve function or if you have stenosis. Stenosis is when the heart valves are too narrow. If blood is flowing backward through the heart valves (regurgitation). A tumor or infectious growth around the heart valves. Areas of heart muscle that are not working well because of poor blood flow or injury from a heart attack. Aneurysm detection. An aneurysm is a weak or damaged part of an artery wall. The wall bulges out from the normal force of blood pumping through the body. Tell a health care provider about: Any allergies you have. All medicines you are taking, including vitamins, herbs, eye drops, creams, and over-the-counter medicines. Any blood disorders you have. Any surgeries you have had. Any medical conditions you have. Whether you are pregnant or may be pregnant. What are the risks? Generally, this is a safe test. However, problems may occur, including an allergic reaction to dye (contrast) that may be used during the test. What happens before the test? No specific preparation is needed. You may eat and drink normally. What happens during the test?  You will take off your clothes from the waist up and put on a hospital gown. Electrodes or electrocardiogram (ECG)patches may be   placed on your chest. The electrodes or patches are then connected to a device that monitors your heart rate and rhythm. You will lie down on a table for an ultrasound exam. A gel will be applied to your chest to help sound waves pass through your skin. A handheld device, called a  transducer, will be pressed against your chest and moved over your heart. The transducer produces sound waves that travel to your heart and bounce back (or "echo" back) to the transducer. These sound waves will be captured in real-time and changed into images of your heart that can be viewed on a video monitor. The images will be recorded on a computer and reviewed by your health care provider. You may be asked to change positions or hold your breath for a short time. This makes it easier to get different views or better views of your heart. In some cases, you may receive contrast through an IV in one of your veins. This can improve the quality of the pictures from your heart. The procedure may vary among health care providers and hospitals. What can I expect after the test? You may return to your normal, everyday life, including diet, activities, and medicines, unless your health care provider tells you not to do that. Follow these instructions at home: It is up to you to get the results of your test. Ask your health care provider, or the department that is doing the test, when your results will be ready. Keep all follow-up visits. This is important. Summary An echocardiogram is a test that uses sound waves (ultrasound) to produce images of the heart. Images from an echocardiogram can provide important information about the size and shape of your heart, heart muscle function, heart valve function, and other possible heart problems. You do not need to do anything to prepare before this test. You may eat and drink normally. After the echocardiogram is completed, you may return to your normal, everyday life, unless your health care provider tells you not to do that. This information is not intended to replace advice given to you by your health care provider. Make sure you discuss any questions you have with your health care provider. Document Revised: 06/02/2020 Document Reviewed: 06/02/2020 Elsevier  Patient Education  2022 [employer]   

## 2021-06-16 NOTE — Progress Notes (Signed)
Cardiology Office Note:    Date:  06/16/2021   ID:  Brett, [name] March 25, 1946, MRN [id number]  PCP:  Azzie Glatter, FNP (Inactive)  Cardiologist:  Jenne Campus, MD    Referring MD: No ref. provider found   Chief Complaint  Patient presents with   Follow-up  I am doing very well  History of Present Illness:    Brett [name] is a 75 y.o. male who with past medical history significant for cardiomyopathy ejection fraction 30 to 35%, he did have a cardiac catheterization done which showed only luminal disease, he also got a new hereditary hemochromatosis.  MRI of his heart is being done did not show any hemochromatosis, also essential hypertension obstructive sleep apnea he is a history of chronic smoking but quit about a month ago.  He comes today 2 months of follow-up overall he is doing well.  He said he does not have much shortness of breath at the same time he tells me that he does not do much.  He spent all day practically watching some movies on the computer.  There is minimal swelling of lower extremities there is no proximal nocturnal dyspnea however he said that he have to wake up many times during the night  Past Medical History:  Diagnosis Date   Acid reflux    Allergy    Colon polyp    Diabetes mellitus without complication (HCC)    Dilated cardiomyopathy (HCC)    non isch   Gout    Gout    Hemochromatosis    Hypertension    OSA (obstructive sleep apnea)    Urine frequency     Past Surgical History:  Procedure Laterality Date   EYE SURGERY Bilateral    Cataract   RIGHT/LEFT HEART CATH AND CORONARY ANGIOGRAPHY N/A 03/29/2021   Procedure: RIGHT/LEFT HEART CATH AND CORONARY ANGIOGRAPHY;  Surgeon: Nigel Mormon, MD;  Location: [facility];  Service: Cardiovascular;  Laterality: N/A;    Current Medications: Current Meds  Medication Sig   allopurinol (ZYLOPRIM) 300 MG tablet TAKE 1 TABLET BY MOUTH  DAILY TO PREVENT GOUT (Patient taking  differently: Take 300 mg by mouth daily.)   aspirin 81 MG tablet Take 81 mg by mouth daily.   atorvastatin (LIPITOR) 20 MG tablet TAKE 1 TABLET BY MOUTH  DAILY FOR CHOLESTEROL (Patient taking differently: Take 20 mg by mouth daily.)   bisoprolol (ZEBETA) 10 MG tablet Take 1 tablet (10 mg total) by mouth daily.   Cholecalciferol (VITAMIN D PO) Take 5,000 Units by mouth daily.   D-Mannose 500 MG CAPS Take 1,500 mg by mouth daily.   dapagliflozin propanediol (FARXIGA) 10 MG TABS tablet Take 1 tablet (10 mg total) by mouth daily.   famotidine (PEPCID) 40 MG tablet Take 40 mg by mouth daily.   furosemide (LASIX) 20 MG tablet Take 1 tablet (20 mg total) by mouth daily. TAKE 1 TABLET BY MOUTH  DAILY FOR BP, / FLUID  RETENTION / ANKLE SWELLING   glipiZIDE (GLUCOTROL) 10 MG tablet Take 1 tablet (10 mg total) by mouth daily before breakfast.   insulin isophane & regular human (HUMULIN 70/30 MIX) (70-30) 100 UNIT/ML KwikPen Inject into subcutaneous skin 22mn before meals, 45units breakfast, 40units lunch and 22units dinner. (Patient taking differently: Inject 20-45 Units into the skin in the morning, at noon, and at bedtime. Inject into subcutaneous skin 131m before meals, 45units breakfast, 25 units lunch and 20units dinner.)   Loratadine (CLARITIN PO)  Take 1 tablet by mouth daily. Unknown strength   magnesium oxide (MAG-OX) 400 MG tablet Take 400 mg by mouth daily.   Melatonin 10 MG CAPS Take 10 mg by mouth at bedtime as needed (sleep).   metFORMIN (GLUCOPHAGE-XR) 500 MG 24 hr tablet TAKE 2 TABLETS BY MOUTH  TWICE DAILY WITH MEALS FOR  DIABETES (Patient taking differently: Take 500 mg by mouth in the morning and at bedtime. TAKE 2 TABLETS BY MOUTH  TWICE DAILY WITH MEALS FOR  DIABETES)   sacubitril-valsartan (ENTRESTO) 49-51 MG Take 1 tablet by mouth 2 (two) times daily.   spironolactone (ALDACTONE) 25 MG tablet Take 1 tablet (25 mg total) by mouth daily.   tamsulosin (FLOMAX) 0.4 MG CAPS capsule TAKE 1  CAPSULE BY MOUTH AT  BEDTIME FOR PROSTATE (Patient taking differently: Take 0.4 mg by mouth at bedtime.)     Allergies:   Patient has no known allergies.   Social History   Socioeconomic History   Marital status: Divorced    Spouse name: Not on file   Number of children: Not on file   Years of education: Not on file   Highest education level: Not on file  Occupational History   Not on file  Tobacco Use   Smoking status: Former    Packs/day: 3.00    Years: 19.00    Pack years: 57.00    Types: Cigarettes    Start date: 10/24/1957    Quit date: 11/24/1976    Years since quitting: 44.5   Smokeless tobacco: Never  Substance and Sexual Activity   Alcohol use: Yes    Comment: Occasionally   Drug use: No   Sexual activity: Not on file  Other Topics Concern   Not on file  Social History Narrative   Pt lives w/ ex wife   Social Determinants of Health   Financial Resource Strain: Not on file  Food Insecurity: Not on file  Transportation Needs: Not on file  Physical Activity: Not on file  Stress: Not on file  Social Connections: Not on file     Family History: The patient's family history includes Cancer in his maternal uncle; Diabetes in his brother, father, and another family member; Heart attack in his brother; Heart disease in an other family member; Hyperlipidemia in an other family member; Hypertension in an other family member. ROS:   Please see the history of present illness.    All 14 point review of systems negative except as described per history of present illness  EKGs/Labs/Other Studies Reviewed:      Recent Labs: 03/25/2021: ALT 28 03/26/2021: TSH 3.764 03/28/2021: B Natriuretic Peptide 65.0 05/10/2021: BUN 24; Creatinine, Ser 1.34; Hemoglobin 16.6; Magnesium 2.4; Platelets 188; Potassium 4.7; Sodium 140  Recent Lipid Panel    Component Value Date/Time   CHOL 111 03/26/2021 0326   TRIG 42 03/26/2021 0326   HDL 39 (L) 03/26/2021 0326   CHOLHDL 2.8 03/26/2021  0326   VLDL 8 03/26/2021 0326   LDLCALC 64 03/26/2021 0326   LDLCALC 56 07/06/2020 1429    Physical Exam:    VS:  BP 112/66 (BP Location: Right Arm, Patient Position: Sitting)   Pulse (!) 59   Ht '5\' 5"'$  (1.651 m)   Wt 214 lb (97.1 kg)   SpO2 97%   BMI 35.61 kg/m     Wt Readings from Last 3 Encounters:  06/16/21 214 lb (97.1 kg)  05/10/21 213 lb (96.6 kg)  04/12/21 212 lb (96.2 kg)  GEN:  Well nourished, well developed in no acute distress HEENT: Normal NECK: No JVD; No carotid bruits LYMPHATICS: No lymphadenopathy CARDIAC: RRR, no murmurs, no rubs, no gallops RESPIRATORY:  Clear to auscultation without rales, wheezing or rhonchi  ABDOMEN: Soft, non-tender, non-distended MUSCULOSKELETAL:  No edema; No deformity  SKIN: Warm and dry LOWER EXTREMITIES: no swelling NEUROLOGIC:  Alert and oriented x 3 PSYCHIATRIC:  Normal affect   ASSESSMENT:    1. Dilated cardiomyopathy (HCC)   2. HFrEF (heart failure with reduced ejection fraction) ([place])   3. Diabetes mellitus without complication ([place])   4. Hereditary hemochromatosis ([place])    PLAN:    In order of problems listed above:  Dilated cardiomyopathy.  He is on appropriate guideline directed medical therapy which I will continue.  Next month we will repeat echocardiogram if echocardiogram did not show improvement in left ventricle ejection fraction we will consider referral to EP for ICD weight resynchronization therapy. Congestive heart failure New [place] Heart Association class II/III.  Seems to be compensated on physical exam Diabetes that being followed by antimedicine team his hemoglobin A1c in June was 7.6.  He is scheduled to have it rechecked. Hereditary hemochromatosis followed by antimedicine team Diabetes he is on Lipitor 20 with LDL 64 and HDL 39 and 2 status from [facility]   Medication Adjustments/Labs and Tests Ordered: Current medicines are reviewed at length with the patient today.  Concerns regarding medicines  are outlined above.  No orders of the defined types were placed in this encounter.  Medication changes: No orders of the defined types were placed in this encounter.   Signed, Park Liter, MD, [facility] 06/16/2021 2:57 PM     [facility]

## 2021-06-20 ENCOUNTER — Other Ambulatory Visit: Payer: Self-pay | Admitting: Adult Health

## 2021-06-25 ENCOUNTER — Other Ambulatory Visit: Payer: Self-pay

## 2021-06-25 ENCOUNTER — Ambulatory Visit (HOSPITAL_BASED_OUTPATIENT_CLINIC_OR_DEPARTMENT_OTHER)
Admission: RE | Admit: 2021-06-25 | Discharge: 2021-06-25 | Disposition: A | Discharge status: Home / Self Care | Payer: Medicare Other | Source: Ambulatory Visit | Attending: Cardiology | Admitting: Cardiology

## 2021-06-25 DIAGNOSIS — I420 Dilated cardiomyopathy: Secondary | ICD-10-CM | POA: Diagnosis not present

## 2021-06-25 LAB — ECHOCARDIOGRAM COMPLETE
AR max vel: 2.06 cm2
AV Peak grad: 7.7 mmHg
Ao pk vel: 1.39 m/s
Area-P 1/2: 2.59 cm2
Calc EF: 30.2 %
S' Lateral: 5 cm
Single Plane A2C EF: 36.5 %
Single Plane A4C EF: 29.3 %

## 2021-06-30 ENCOUNTER — Telehealth: Payer: Self-pay

## 2021-06-30 DIAGNOSIS — I5020 Unspecified systolic (congestive) heart failure: Secondary | ICD-10-CM

## 2021-06-30 NOTE — Telephone Encounter (Signed)
Spoke with patient regarding results and recommendation.  Patient verbalizes understanding and is agreeable to plan of care. Advised patient to call back with any issues or concerns.  

## 2021-06-30 NOTE — Telephone Encounter (Signed)
-----   Message from Park Liter, MD sent at 06/30/2021 12:20 PM EDT ----- He is ejection fraction show persistently decreased left ventricle ejection fraction 30%.  Please make referral to EP team for consideration of ICD

## 2021-07-05 MED ORDER — SPIRONOLACTONE 25 MG PO TABS: 25.0000 mg | ORAL_TABLET | Freq: Every day | ORAL | 3 refills | Status: DC

## 2021-07-05 MED ORDER — SACUBITRIL-VALSARTAN 49-51 MG PO TABS: 1.0000 | ORAL_TABLET | Freq: Two times a day (BID) | ORAL | 3 refills | Status: DC

## 2021-07-05 MED ORDER — BISOPROLOL FUMARATE 10 MG PO TABS: 10.0000 mg | ORAL_TABLET | Freq: Every day | ORAL | 3 refills | Status: DC

## 2021-07-08 ENCOUNTER — Ambulatory Visit: Payer: Medicare Other | Admitting: Dietician

## 2021-08-04 ENCOUNTER — Other Ambulatory Visit: Payer: Self-pay | Admitting: Adult Health

## 2021-08-19 ENCOUNTER — Other Ambulatory Visit: Payer: Self-pay

## 2021-08-19 ENCOUNTER — Encounter: Payer: Self-pay | Admitting: Cardiology

## 2021-08-19 ENCOUNTER — Ambulatory Visit (INDEPENDENT_AMBULATORY_CARE_PROVIDER_SITE_OTHER): Payer: Medicare Other | Admitting: Cardiology

## 2021-08-19 VITALS — BP 120/72 | HR 87 | Ht 65.0 in | Wt 207.0 lb

## 2021-08-19 DIAGNOSIS — I428 Other cardiomyopathies: Secondary | ICD-10-CM

## 2021-08-19 DIAGNOSIS — Z01812 Encounter for preprocedural laboratory examination: Secondary | ICD-10-CM

## 2021-08-19 DIAGNOSIS — Z01818 Encounter for other preprocedural examination: Secondary | ICD-10-CM | POA: Diagnosis not present

## 2021-08-19 LAB — CBC
Hematocrit: 45.8 % (ref 37.5–51.0)
Hemoglobin: 15.9 g/dL (ref 13.0–17.7)
MCH: 32.6 pg (ref 26.6–33.0)
MCHC: 34.7 g/dL (ref 31.5–35.7)
MCV: 94 fL (ref 79–97)
Platelets: 168 10*3/uL (ref 150–450)
RBC: 4.87 x10E6/uL (ref 4.14–5.80)
RDW: 14.5 % (ref 11.6–15.4)
WBC: 7.6 10*3/uL (ref 3.4–10.8)

## 2021-08-19 LAB — BASIC METABOLIC PANEL
BUN/Creatinine Ratio: 19 (ref 10–24)
BUN: 25 mg/dL (ref 8–27)
CO2: 26 mmol/L (ref 20–29)
Calcium: 9.5 mg/dL (ref 8.6–10.2)
Chloride: 104 mmol/L (ref 96–106)
Creatinine, Ser: 1.31 mg/dL — ABNORMAL HIGH (ref 0.76–1.27)
Glucose: 233 mg/dL — ABNORMAL HIGH (ref 70–99)
Potassium: 4.4 mmol/L (ref 3.5–5.2)
Sodium: 139 mmol/L (ref 134–144)
eGFR: 57 mL/min/{1.73_m2} — ABNORMAL LOW (ref >59)

## 2021-08-19 NOTE — H&P (View-Only) (Signed)
Electrophysiology Office Note   Date:  08/19/2021   ID:  Brett [name] 1946/04/29, MRN [id number]  PCP:  Azzie Glatter, FNP (Inactive)  Cardiologist:  Brett [name] Primary Electrophysiologist:  Brett Franchini Meredith Leeds, MD    Chief Complaint: CHF   History of Present Illness: Brett [name] is a 75 y.o. male who is being seen today for the evaluation of CHF at the request of Brett Liter, MD. Presenting today for electrophysiology evaluation.  He has a history significant for diabetes, dilated cardiomyopathy, hypertension, hemochromatosis.  He had a cardiac catheterization that showed no obstructive coronary artery disease.  Cardiac MRI did not show hemochromatosis.  Today, he denies symptoms of palpitations, chest pain, orthopnea, PND, lower extremity edema, claudication, dizziness, presyncope, syncope, bleeding, or neurologic sequela. The patient is tolerating medications without difficulties.  His main heart failure symptoms are shortness of breath and fatigue.  He has no chest pain.  His daughter states that he does not do much daily activity, but he sits around the house most of the time and sleeps quite a bit more.   Past Medical History:  Diagnosis Date   Acid reflux    Allergy    Colon polyp    Diabetes mellitus without complication ([place])    Dilated cardiomyopathy (HCC)    non isch   Gout    Gout    Hemochromatosis    Hypertension    OSA (obstructive sleep apnea)    Urine frequency    Past Surgical History:  Procedure Laterality Date   EYE SURGERY Bilateral    Cataract   RIGHT/LEFT HEART CATH AND CORONARY ANGIOGRAPHY N/A 03/29/2021   Procedure: RIGHT/LEFT HEART CATH AND CORONARY ANGIOGRAPHY;  Surgeon: Nigel Mormon, MD;  Location: [facility];  Service: Cardiovascular;  Laterality: N/A;     Current Outpatient Medications  Medication Sig Dispense Refill   allopurinol (ZYLOPRIM) 300 MG tablet TAKE 1 TABLET BY MOUTH  DAILY TO PREVENT GOUT  (Patient taking differently: Take 300 mg by mouth daily.) 90 tablet 3   aspirin 81 MG tablet Take 81 mg by mouth daily.     atorvastatin (LIPITOR) 20 MG tablet TAKE 1 TABLET BY MOUTH  DAILY FOR CHOLESTEROL (Patient taking differently: Take 20 mg by mouth daily.) 90 tablet 3   bisoprolol (ZEBETA) 10 MG tablet Take 1 tablet (10 mg total) by mouth daily. 90 tablet 3   Cholecalciferol (VITAMIN D PO) Take 5,000 Units by mouth daily.     D-Mannose 500 MG CAPS Take 1,500 mg by mouth daily.     dapagliflozin propanediol (FARXIGA) 10 MG TABS tablet Take 1 tablet (10 mg total) by mouth daily. 90 tablet 3   famotidine (PEPCID) 40 MG tablet Take 40 mg by mouth daily.     furosemide (LASIX) 20 MG tablet Take 1 tablet (20 mg total) by mouth daily. TAKE 1 TABLET BY MOUTH  DAILY FOR BP, / FLUID  RETENTION / ANKLE SWELLING 90 tablet 3   glipiZIDE (GLUCOTROL) 10 MG tablet Take 1 tablet (10 mg total) by mouth daily before breakfast. 30 tablet 1   insulin isophane & regular human (HUMULIN 70/30 MIX) (70-30) 100 UNIT/ML KwikPen Inject into subcutaneous skin 6min before meals, 45units breakfast, 40units lunch and 22units dinner. (Patient taking differently: Inject 20-45 Units into the skin in the morning, at noon, and at bedtime. Inject into subcutaneous skin 58min before meals, 45units breakfast, 25 units lunch and 20units dinner.) 135 mL 4  Loratadine (CLARITIN PO) Take 1 tablet by mouth daily. Unknown strength     magnesium oxide (MAG-OX) 400 MG tablet Take 400 mg by mouth daily.     Melatonin 10 MG CAPS Take 10 mg by mouth at bedtime as needed (sleep).     metFORMIN (GLUCOPHAGE-XR) 500 MG 24 hr tablet TAKE 2 TABLETS BY MOUTH  TWICE DAILY WITH MEALS FOR  DIABETES (Patient taking differently: Take 500 mg by mouth in the morning and at bedtime. TAKE 2 TABLETS BY MOUTH  TWICE DAILY WITH MEALS FOR  DIABETES) 360 tablet 3   sacubitril-valsartan (ENTRESTO) 49-51 MG Take 1 tablet by mouth 2 (two) times daily. 180 tablet 3    spironolactone (ALDACTONE) 25 MG tablet Take 1 tablet (25 mg total) by mouth daily. 90 tablet 3   tamsulosin (FLOMAX) 0.4 MG CAPS capsule TAKE 1 CAPSULE BY MOUTH AT  BEDTIME FOR PROSTATE (Patient taking differently: Take 0.4 mg by mouth at bedtime.) 90 capsule 3   No current facility-administered medications for this visit.    Allergies:   Patient has no known allergies.   Social History:  The patient  reports that he quit smoking about 44 years ago. His smoking use included cigarettes. He started smoking about 63 years ago. He has a 57.00 pack-year smoking history. He has never used smokeless tobacco. He reports current alcohol use. He reports that he does not use drugs.   Family History:  The patient's family history includes Cancer in his maternal uncle; Diabetes in his brother, father, and another family member; Heart attack in his brother; Heart disease in an other family member; Hyperlipidemia in an other family member; Hypertension in an other family member.    ROS:  Please see the history of present illness.   Otherwise, review of systems is positive for none.   All other systems are reviewed and negative.    PHYSICAL EXAM: VS:  BP 120/72   Pulse 87   Ht 5\' 5"  (1.651 m)   Wt 207 lb (93.9 kg)   SpO2 92%   BMI 34.45 kg/m  , BMI Body mass index is 34.45 kg/m. GEN: Well nourished, well developed, in no acute distress  HEENT: normal  Neck: no JVD, carotid bruits, or masses Cardiac: RRR; no murmurs, rubs, or gallops,no edema  Respiratory:  clear to auscultation bilaterally, normal work of breathing GI: soft, nontender, nondistended, + BS MS: no deformity or atrophy  Skin: warm and dry Neuro:  Strength and sensation are intact Psych: euthymic mood, full affect  EKG:  EKG is ordered today. Personal review of the ekg ordered shows sinus rhythm, IVCD  Recent Labs: 03/25/2021: ALT 28 03/26/2021: TSH 3.764 03/28/2021: B Natriuretic Peptide 65.0 05/10/2021: BUN 24; Creatinine, Ser  1.34; Hemoglobin 16.6; Magnesium 2.4; Platelets 188; Potassium 4.7; Sodium 140    Lipid Panel     Component Value Date/Time   CHOL 111 03/26/2021 0326   TRIG 42 03/26/2021 0326   HDL 39 (L) 03/26/2021 0326   CHOLHDL 2.8 03/26/2021 0326   VLDL 8 03/26/2021 0326   LDLCALC 64 03/26/2021 0326   LDLCALC 56 07/06/2020 1429     Wt Readings from Last 3 Encounters:  08/19/21 207 lb (93.9 kg)  06/16/21 214 lb (97.1 kg)  05/10/21 213 lb (96.6 kg)      Other studies Reviewed: Additional studies/ records that were reviewed today include: TTE 06/25/21  Review of the above records today demonstrates:  1. Left ventricular ejection fraction, by estimation, is  30%. The left  ventricle has moderately decreased function. The left ventricle has no  regional wall motion abnormalities. The left ventricular internal cavity  size was moderately to severely  dilated. There is moderate concentric left ventricular hypertrophy. Left  ventricular diastolic parameters are consistent with Grade I diastolic  dysfunction (impaired relaxation).   2. Right ventricular systolic function is normal. The right ventricular  size is normal.   3. The mitral valve is normal in structure. No evidence of mitral valve  regurgitation. No evidence of mitral stenosis.   4. The aortic valve is tricuspid. Aortic valve regurgitation is not  visualized. No aortic stenosis is present.   5. The inferior vena cava is normal in size with greater than 50%  respiratory variability, suggesting right atrial pressure of 3 mmHg.   LHC/RHC 03/29/21 LM: Normal LAD: Minimal luminal irregularities. Mild calcification. LCx: Normal Ramus: Minimal luminal irregularities.  RCA: Minimal luminal irregularities. Mild calcification. Normal filling pressures  CMRI 03/31/21 1. Moderately dilated left ventricular size, with LVEDD 63 mm, but LVEDVi 125 mL/m2. Normal left ventricular thickness, with intraventricular septal thickness of 10 mm,  posterior wall thickness of 10 mm and myocardial mass index of 77 g/m2. Moderate left ventricular systolic dysfunction (LVEF =35%). There are regional wall motion abnormalities: Mid anteroseptal, inferoseptal, and inferior, and apical inferior and septal hypokinesis Left ventricular parametric mapping notable for increase in Native T1 signal (1200 ms greatest in the basal inferior and inferoseptal); increase in T2 in mid inferior (57 ms) and basal anterolateral (60 ms). There is late gadolinium enhancement in the left ventricular myocardium: Mid myocardial enhancement in the anteroseptal and inferoseptal base that extends into the basal to mid inferior. 2. Normal right ventricular size with RVEDVI 74 mL/m2 Normal right ventricular thickness. Normal right ventricular systolic function (RVEF =37%). There are no regional wall motion abnormalities or aneurysms. 3.  Normal left and right atrial size. 4. Normal size of the aortic root, ascending aorta and pulmonary artery. 5.  No significant valvular abnormalities 6.  Normal pericardium.  No pericardial effusion. 7. Grossly, no extracardiac findings. Recommended dedicated study if concerned for non-cardiac pathology. 8.  Significant breath hold artifact.  ASSESSMENT AND PLAN:  1.  Chronic systolic heart failure due to nonischemic cardiomyopathy: Currently on optimal medical therapy with bisoprolol 10 mg daily, Aldactone 25 mg daily, Farxiga 10 mg daily, Entresto 49/51 mg twice daily.  Unfortunately his ejection fraction remains low.  He would thus benefit from ICD implant.  Risk and benefits were discussed which include bleeding, tamponade, infection, pneumothorax, lead dislodgment, renal failure, death.  The patient understands these risks and has agreed to the procedure.  2.  Hypertension: Currently well controlled  3.  Hemochromatosis: No obvious cardiac hemochromatosis on MRI.  Plan per primary team.  Case discussed with primary  cardiology  Current medicines are reviewed at length with the patient today.   The patient does not have concerns regarding his medicines.  The following changes were made today:  none  Labs/ tests ordered today include:  Orders Placed This Encounter  Procedures   Basic metabolic panel   CBC   EKG 12-Lead     Disposition:   FU with Digna Countess 3 months  Signed, Darrian Grzelak Meredith Leeds, MD  08/19/2021 10:48 AM     [address] [place] [address] [phone] (office) [phone] (fax)

## 2021-08-19 NOTE — Progress Notes (Signed)
Electrophysiology Office Note   Date:  08/19/2021   ID:  Brett, [name] September 26, 1946, MRN [id number]  PCP:  Brett Glatter, FNP (Inactive)  Cardiologist:  Brett [name] Primary Electrophysiologist:  Brett Coin Meredith Leeds, MD    Chief Complaint: CHF   History of Present Illness: Brett [name] is a 75 y.o. male who is being seen today for the evaluation of CHF at the request of Park Liter, MD. Presenting today for electrophysiology evaluation.  He has a history significant for diabetes, dilated cardiomyopathy, hypertension, hemochromatosis.  He had a cardiac catheterization that showed no obstructive coronary artery disease.  Cardiac MRI did not show hemochromatosis.  Today, he denies symptoms of palpitations, chest pain, orthopnea, PND, lower extremity edema, claudication, dizziness, presyncope, syncope, bleeding, or neurologic sequela. The patient is tolerating medications without difficulties.  His main heart failure symptoms are shortness of breath and fatigue.  He has no chest pain.  His daughter states that he does not do much daily activity, but he sits around the house most of the time and sleeps quite a bit more.   Past Medical History:  Diagnosis Date   Acid reflux    Allergy    Colon polyp    Diabetes mellitus without complication ([place])    Dilated cardiomyopathy ([facility])    non isch   Gout    Gout    Hemochromatosis    Hypertension    OSA (obstructive sleep apnea)    Urine frequency    Past Surgical History:  Procedure Laterality Date   EYE SURGERY Bilateral    Cataract   RIGHT/LEFT HEART CATH AND CORONARY ANGIOGRAPHY N/A 03/29/2021   Procedure: RIGHT/LEFT HEART CATH AND CORONARY ANGIOGRAPHY;  Surgeon: Nigel Mormon, MD;  Location: [facility];  Service: Cardiovascular;  Laterality: N/A;     Current Outpatient Medications  Medication Sig Dispense Refill   allopurinol (ZYLOPRIM) 300 MG tablet TAKE 1 TABLET BY MOUTH  DAILY TO PREVENT GOUT  (Patient taking differently: Take 300 mg by mouth daily.) 90 tablet 3   aspirin 81 MG tablet Take 81 mg by mouth daily.     atorvastatin (LIPITOR) 20 MG tablet TAKE 1 TABLET BY MOUTH  DAILY FOR CHOLESTEROL (Patient taking differently: Take 20 mg by mouth daily.) 90 tablet 3   bisoprolol (ZEBETA) 10 MG tablet Take 1 tablet (10 mg total) by mouth daily. 90 tablet 3   Cholecalciferol (VITAMIN D PO) Take 5,000 Units by mouth daily.     D-Mannose 500 MG CAPS Take 1,500 mg by mouth daily.     dapagliflozin propanediol (FARXIGA) 10 MG TABS tablet Take 1 tablet (10 mg total) by mouth daily. 90 tablet 3   famotidine (PEPCID) 40 MG tablet Take 40 mg by mouth daily.     furosemide (LASIX) 20 MG tablet Take 1 tablet (20 mg total) by mouth daily. TAKE 1 TABLET BY MOUTH  DAILY FOR BP, / FLUID  RETENTION / ANKLE SWELLING 90 tablet 3   glipiZIDE (GLUCOTROL) 10 MG tablet Take 1 tablet (10 mg total) by mouth daily before breakfast. 30 tablet 1   insulin isophane & regular human (HUMULIN 70/30 MIX) (70-30) 100 UNIT/ML KwikPen Inject into subcutaneous skin 42min before meals, 45units breakfast, 40units lunch and 22units dinner. (Patient taking differently: Inject 20-45 Units into the skin in the morning, at noon, and at bedtime. Inject into subcutaneous skin 81min before meals, 45units breakfast, 25 units lunch and 20units dinner.) 135 mL 4  Loratadine (CLARITIN PO) Take 1 tablet by mouth daily. Unknown strength     magnesium oxide (MAG-OX) 400 MG tablet Take 400 mg by mouth daily.     Melatonin 10 MG CAPS Take 10 mg by mouth at bedtime as needed (sleep).     metFORMIN (GLUCOPHAGE-XR) 500 MG 24 hr tablet TAKE 2 TABLETS BY MOUTH  TWICE DAILY WITH MEALS FOR  DIABETES (Patient taking differently: Take 500 mg by mouth in the morning and at bedtime. TAKE 2 TABLETS BY MOUTH  TWICE DAILY WITH MEALS FOR  DIABETES) 360 tablet 3   sacubitril-valsartan (ENTRESTO) 49-51 MG Take 1 tablet by mouth 2 (two) times daily. 180 tablet 3    spironolactone (ALDACTONE) 25 MG tablet Take 1 tablet (25 mg total) by mouth daily. 90 tablet 3   tamsulosin (FLOMAX) 0.4 MG CAPS capsule TAKE 1 CAPSULE BY MOUTH AT  BEDTIME FOR PROSTATE (Patient taking differently: Take 0.4 mg by mouth at bedtime.) 90 capsule 3   No current facility-administered medications for this visit.    Allergies:   Patient has no known allergies.   Social History:  The patient  reports that he quit smoking about 44 years ago. His smoking use included cigarettes. He started smoking about 63 years ago. He has a 57.00 pack-year smoking history. He has never used smokeless tobacco. He reports current alcohol use. He reports that he does not use drugs.   Family History:  The patient's family history includes Cancer in his maternal uncle; Diabetes in his brother, father, and another family member; Heart attack in his brother; Heart disease in an other family member; Hyperlipidemia in an other family member; Hypertension in an other family member.    ROS:  Please see the history of present illness.   Otherwise, review of systems is positive for none.   All other systems are reviewed and negative.    PHYSICAL EXAM: VS:  BP 120/72   Pulse 87   Ht 5\' 5"  (1.651 m)   Wt 207 lb (93.9 kg)   SpO2 92%   BMI 34.45 kg/m  , BMI Body mass index is 34.45 kg/m. GEN: Well nourished, well developed, in no acute distress  HEENT: normal  Neck: no JVD, carotid bruits, or masses Cardiac: RRR; no murmurs, rubs, or gallops,no edema  Respiratory:  clear to auscultation bilaterally, normal work of breathing GI: soft, nontender, nondistended, + BS MS: no deformity or atrophy  Skin: warm and dry Neuro:  Strength and sensation are intact Psych: euthymic mood, full affect  EKG:  EKG is ordered today. Personal review of the ekg ordered shows sinus rhythm, IVCD  Recent Labs: 03/25/2021: ALT 28 03/26/2021: TSH 3.764 03/28/2021: B Natriuretic Peptide 65.0 05/10/2021: BUN 24; Creatinine, Ser  1.34; Hemoglobin 16.6; Magnesium 2.4; Platelets 188; Potassium 4.7; Sodium 140    Lipid Panel     Component Value Date/Time   CHOL 111 03/26/2021 0326   TRIG 42 03/26/2021 0326   HDL 39 (L) 03/26/2021 0326   CHOLHDL 2.8 03/26/2021 0326   VLDL 8 03/26/2021 0326   LDLCALC 64 03/26/2021 0326   LDLCALC 56 07/06/2020 1429     Wt Readings from Last 3 Encounters:  08/19/21 207 lb (93.9 kg)  06/16/21 214 lb (97.1 kg)  05/10/21 213 lb (96.6 kg)      Other studies Reviewed: Additional studies/ records that were reviewed today include: TTE 06/25/21  Review of the above records today demonstrates:  1. Left ventricular ejection fraction, by estimation, is  30%. The left  ventricle has moderately decreased function. The left ventricle has no  regional wall motion abnormalities. The left ventricular internal cavity  size was moderately to severely  dilated. There is moderate concentric left ventricular hypertrophy. Left  ventricular diastolic parameters are consistent with Grade I diastolic  dysfunction (impaired relaxation).   2. Right ventricular systolic function is normal. The right ventricular  size is normal.   3. The mitral valve is normal in structure. No evidence of mitral valve  regurgitation. No evidence of mitral stenosis.   4. The aortic valve is tricuspid. Aortic valve regurgitation is not  visualized. No aortic stenosis is present.   5. The inferior vena cava is normal in size with greater than 50%  respiratory variability, suggesting right atrial pressure of 3 mmHg.   LHC/RHC 03/29/21 LM: Normal LAD: Minimal luminal irregularities. Mild calcification. LCx: Normal Ramus: Minimal luminal irregularities.  RCA: Minimal luminal irregularities. Mild calcification. Normal filling pressures  CMRI 03/31/21 1. Moderately dilated left ventricular size, with LVEDD 63 mm, but LVEDVi 125 mL/m2. Normal left ventricular thickness, with intraventricular septal thickness of 10 mm,  posterior wall thickness of 10 mm and myocardial mass index of 77 g/m2. Moderate left ventricular systolic dysfunction (LVEF =35%). There are regional wall motion abnormalities: Mid anteroseptal, inferoseptal, and inferior, and apical inferior and septal hypokinesis Left ventricular parametric mapping notable for increase in Native T1 signal (1200 ms greatest in the basal inferior and inferoseptal); increase in T2 in mid inferior (57 ms) and basal anterolateral (60 ms). There is late gadolinium enhancement in the left ventricular myocardium: Mid myocardial enhancement in the anteroseptal and inferoseptal base that extends into the basal to mid inferior. 2. Normal right ventricular size with RVEDVI 74 mL/m2 Normal right ventricular thickness. Normal right ventricular systolic function (RVEF =69%). There are no regional wall motion abnormalities or aneurysms. 3.  Normal left and right atrial size. 4. Normal size of the aortic root, ascending aorta and pulmonary artery. 5.  No significant valvular abnormalities 6.  Normal pericardium.  No pericardial effusion. 7. Grossly, no extracardiac findings. Recommended dedicated study if concerned for non-cardiac pathology. 8.  Significant breath hold artifact.  ASSESSMENT AND PLAN:  1.  Chronic systolic heart failure due to nonischemic cardiomyopathy: Currently on optimal medical therapy with bisoprolol 10 mg daily, Aldactone 25 mg daily, Farxiga 10 mg daily, Entresto 49/51 mg twice daily.  Unfortunately his ejection fraction remains low.  He would thus benefit from ICD implant.  Risk and benefits were discussed which include bleeding, tamponade, infection, pneumothorax, lead dislodgment, renal failure, death.  The patient understands these risks and has agreed to the procedure.  2.  Hypertension: Currently well controlled  3.  Hemochromatosis: No obvious cardiac hemochromatosis on MRI.  Plan per primary team.  Case discussed with primary  cardiology  Current medicines are reviewed at length with the patient today.   The patient does not have concerns regarding his medicines.  The following changes were made today:  none  Labs/ tests ordered today include:  Orders Placed This Encounter  Procedures   Basic metabolic panel   CBC   EKG 12-Lead     Disposition:   FU with Calah Gershman 3 months  Signed, Ayvin Lipinski Meredith Leeds, MD  08/19/2021 10:48 AM     [address] [place] [id number] [phone] (office) [phone] (fax)

## 2021-08-19 NOTE — Patient Instructions (Addendum)
Medication Instructions:  Your physician recommends that you continue on your current medications as directed. Please refer to the Current Medication list given to you today.     * If you need a refill on your cardiac medications before your next appointment, please call your pharmacy. *   Labwork: Pre procedure lab work today: BMET & CBC  If you have labs (blood work) drawn today and your tests are completely normal, you will receive your results only by: [employer] (if you have MyChart) OR A paper copy in the mail If you have any lab test that is abnormal or we need to change your treatment, we will call you to review the results.   Testing/Procedures: Your physician has recommended that you have a defibrillator inserted. An implantable cardioverter defibrillator (ICD) is a small device that is placed in your chest or, in rare cases, your abdomen. This device uses electrical pulses or shocks to help control life-threatening, irregular heartbeats that could lead the heart to suddenly stop beating (sudden cardiac arrest). Leads are attached to the ICD that goes into your heart. This is done in the hospital and usually requires an overnight stay. Please follow the instructions below, located under the special instructions section.   Follow-Up: Your physician recommends that you schedule a wound check appointment 10-14 days, after your procedure on 09/13/2021, with the device clinic.  Your physician recommends that you schedule a follow up appointment in 91 days, after your procedure on 09/13/2021, with Dr. Curt Bears.   Thank you for choosing [facility]!!   Trinidad Curet, RN [phone]   Any Other Special Instructions Will Be Listed Below (If Applicable).     Implantable Device Instructions  You are scheduled for:                  _____ Implantable Cardioverter Defibrillator  on  09/13/2021  with Dr. Curt Bears.  1.   Please arrive at the [facility], Entrance "A"  at  [facility] at  5:30 am on the day of your procedure. (The address is [address]  Do not eat or drink after midnight the night before your procedure.  3.   Complete pre procedure  lab work on 08/19/21.  The lab at [facility] is open from 8:00 AM to 4:30 PM.  You do not have to be fasting.  4.   Hold all of your morning medications the morning of your procedure.  5.  Plan for an overnight stay, but you may be discharged home after your procedure. If you use your phone frequently bring your phone charger, in case you have to stay.  If you are discharged after your procedure you will need someone to drive you home and be with your for 24 hours after your procedure.  6.  Wash your chest and neck with surgical scrub the evening before and the morning of  your procedure.  Rinse well. Please review the surgical scrub instruction sheet given to you.  7. FYI: For your safety, and to allow [place] to monitor your vital signs accurately during the surgery/procedure we request that if you have artificial nails, gel coating, SNS etc. Please have those removed prior to your surgery/procedure. Not having the nail coverings /polish removed may result in cancellation or delay of your surgery/procedure.                                                          *  If you have ANY questions after you get home, please call Trinidad Curet, RN @ [phone].  * Every attempt is made to prevent procedures from being rescheduled.  Due to the nature of  Electrophysiology, rescheduling can happen.  The physician is always aware and directs the staff when this occurs.     [place] - Preparing For Surgery (surgical scurb)  Before surgery, you can play an important role. Because skin is not sterile, your skin needs to be as free of germs as possible. You can reduce the number of germs on your skin by washing with CHG (chlorahexidine gluconate) Soap before surgery.  CHG is an antiseptic  cleaner which kills germs and bonds with the skin to continue killing germs even after washing.   Please do not use if you have an allergy to CHG or antibacterial soaps.  If your skin becomes reddened/irritated stop using the CHG.   Do not shave (including legs and underarms) for at least 48 hours prior to first CHG shower.  It is OK to shave your face.  Please follow these instructions carefully:  1.  Shower the night before surgery and the morning of surgery with CHG.  2.  If you choose to wash your hair, wash your hair first as usual with your normal shampoo.  3.  After you shampoo, rinse your hair and body thoroughly to remove the shampoo.  4.  Use CHG as you would any other liquid soap.  You can apply CHG directly to the skin and wash gently with a clean washcloth. 5.  Apply the CHG Soap to your body ONLY FROM THE NECK DOWN.  Do not use on open wounds or open sores.  Avoid contact with your eyes, ears, mouth and genitals (private parts).  Wash genitals (private parts) with your normal soap.  6.  Wash thoroughly, paying special attention to the area where your surgery will be performed.  7.  Thoroughly rinse your body with warm water from the neck down.   8.  DO NOT shower/wash with your normal soap after using and rinsing off the CHG soap.  9.  Pat yourself dry with a clean towel.           10.  Wear clean pajamas.           11.  Place clean sheets on your bed the night of your first shower and do not sleep with pets.  Day of Surgery: Do not apply any deodorants/lotions.  Please wear clean clothes to the hospital/surgery center.     Cardioverter Defibrillator Implantation An implantable cardioverter defibrillator (ICD) is a small, lightweight, battery-powered device that is placed (implanted) under the skin in the chest or abdomen. Your caregiver may prescribe an ICD if: You have had an irregular heart rhythm (arrhythmia) that originated in the lower chambers of the heart  (ventricles). Your heart has been damaged by a disease (such as coronary artery disease) or heart condition (such as a heart attack). An ICD consists of a battery that lasts several years, a small computer called a pulse generator, and wires called leads that go into the heart. It is used to detect and correct two dangerous arrhythmias: a rapid heart rhythm (tachycardia) and an arrhythmia in which the ventricles contract in an uncoordinated way (fibrillation). When an ICD detects tachycardia, it sends an electrical signal to the heart that restores the heartbeat to normal (cardioversion). This signal is usually painless. If cardioversion does not work or if  the ICD detects fibrillation, it delivers a small electrical shock to the heart (defibrillation) to restart the heart. The shock may feel like a strong jolt in the chest. ICDs may be programmed to correct other problems. Sometimes, ICDs are programmed to act as another type of implantable device called a pacemaker. Pacemakers are used to treat a slow heartbeat (bradycardia). LET YOUR CAREGIVER KNOW ABOUT: Any allergies you have. All medicines you are taking, including vitamins, herbs, eyedrops, and over-the-counter medicines and creams. Previous problems you or members of your family have had with the use of anesthetics. Any blood disorders you have had. Other health problems you have. RISKS AND COMPLICATIONS Generally, the procedure to implant an ICD is safe. However, as with any surgical procedure, complications can occur. Possible complications associated with implanting an ICD include: Swelling, bleeding, or bruising at the site where the ICD was implanted. Infection at the site where the ICD was implanted. A reaction to medicine used during the procedure. Nerve, heart, or blood vessel damage. Blood clots. BEFORE THE PROCEDURE You may need to have blood tests, heart tests, or a chest X-ray done before the day of the procedure. Ask your  caregiver about changing or stopping your regular medicines. Make plans to have someone drive you home. You may need to stay in the hospital overnight after the procedure. Stop smoking at least 24 hours before the procedure. Take a bath or shower the night before the procedure. You may need to scrub your chest or abdomen with a special type of soap. Do not eat or drink before your procedure for as long as directed by your caregiver. Ask if it is okay to take any needed medicine with a small sip of water. PROCEDURE  The procedure to implant an ICD in your chest or abdomen is usually done at a hospital in a room that has a large X-ray machine called a fluoroscope. The machine will be above you during the procedure. It will help your caregiver see your heart during the procedure. Implanting an ICD usually takes 1-3 hours. Before the procedure:  Small monitors will be put on your body. They will be used to check your heart, blood pressure, and oxygen level. A needle will be put into a vein in your hand or arm. This is called an intravenous (IV) access tube. Fluids and medicine will flow directly into your body through the IV tube. Your chest or abdomen will be cleaned with a germ-killing (antiseptic) solution. The area may be shaved. You may be given medicine to help you relax (sedative). You will be given a medicine called a local anesthetic. This medicine will make the surgical site numb while the ICD is implanted. You will be sleepy but awake during the procedure. After you are numb the procedure will begin. The caregiver will: Make a small cut (incision). This will make a pocket deep under your skin that will hold the pulse generator. Guide the leads through a large blood vessel into your heart and attach them to the heart muscles. Depending on the ICD, the leads may go into one ventricle or they may go to both ventricles and into an upper chamber of the heart (atrium). Test the ICD. Close the  incision with stitches, glue, or staples. AFTER THE PROCEDURE You may feel pain. Some pain is normal. It may last a few days. You may stay in a recovery area until the local anesthetic has worn off. Your blood pressure and pulse will be checked often.  You will be taken to a room where your heart will be monitored. A chest X-ray will be taken. This is done to check that the cardioverter defibrillator is in the right place. You may stay in the hospital overnight. A slight bump may be seen over the skin where the ICD was placed. Sometimes, it is possible to feel the ICD under the skin. This is normal. In the months and years afterward, your caregiver will check the device, the leads, and the battery every few months. Eventually, when the battery is low, the ICD will be replaced.   This information is not intended to replace advice given to you by your health care provider. Make sure you discuss any questions you have with your health care provider.   Document Released: 07/02/2002 Document Revised: 07/31/2013 Document Reviewed: 10/29/2012 Elsevier Interactive Patient Education 2016 [place] Defibrillator Implantation, Care After This sheet gives you information about how to care for yourself after your procedure. Your health care provider may also give you more specific instructions. If you have problems or questions, contact your health care provider. What can I expect after the procedure? After the procedure, it is common to have: Some pain. It may last a few days. A slight bump over the skin where the device was placed. Sometimes, it is possible to feel the device under the skin. This is normal.  During the months and years after your procedure, your health care provider will check the device, the leads, and the battery every few months. Eventually, when the battery is low, the device will be replaced. Follow these instructions at home: Medicines Take over-the-counter  and prescription medicines only as told by your health care provider. If you were prescribed an antibiotic medicine, take it as told by your health care provider. Do not stop taking the antibiotic even if you start to feel better. Incision care  Follow instructions from your health care provider about how to take care of your incision area. Make sure you: Wash your hands with soap and water before you change your bandage (dressing). If soap and water are not available, use hand sanitizer. Change your dressing as told by your health care provider. Leave stitches (sutures), skin glue, or adhesive strips in place. These skin closures may need to stay in place for 2 weeks or longer. If adhesive strip edges start to loosen and curl up, you may trim the loose edges. Do not remove adhesive strips completely unless your health care provider tells you to do that. Check your incision area every day for signs of infection. Check for: More redness, swelling, or pain. More fluid or blood. Warmth. Pus or a bad smell. Do not use lotions or ointments near the incision area unless told by your health care provider. Keep the incision area clean and dry for 2-3 days after the procedure or for as long as told by your health care provider. It takes several weeks for the incision site to heal completely. Do not take baths, swim, or use a hot tub until your health care provider approves. Activity Try to walk a little every day. Exercising is important after this procedure. Also, use your shoulder on the side of the defibrillator in daily tasks that do not require a lot of motion. For at least 6 weeks: Do not lift your upper arm above your shoulders. This means no tennis, golf, or swimming for this period of time. If you tend to sleep with your arm  above your head, use a restraint to prevent this during sleep. Avoid sudden jerking, pulling, or chopping movements that pull your upper arm far away from your body. Ask  your health care provider when you may go back to work. Check with your health care provider before you start to drive or play sports. Electric and magnetic fields Tell all health care providers that you have a defibrillator. This may prevent them from giving you an MRI scan because strong magnets are used for that test. If you must pass through a metal detector, quickly walk through it. Do not stop under the detector, and do not stand near it. Avoid places or objects that have a strong electric or magnetic field, including: Engineer, maintenance. At the airport, let officials know that you have a defibrillator. Your defibrillator ID card will let you be checked in a way that is safe for you and will not damage your defibrillator. Also, do not let a security person wave a magnetic wand near your defibrillator. That can make it stop working. Power plants. Large electrical generators. Anti-theft systems or electronic article surveillance (EAS). Radiofrequency transmission towers, such as cell phone and radio towers. Do not use amateur (ham) radio equipment or electric (arc) welding torches. Some devices are safe to use if held at least 12 inches (30 cm) from your defibrillator. These include power tools, lawn mowers, and speakers. If you are unsure if something is safe to use, ask your health care provider. Do not use MP3 player headphones. They have magnets. You may safely use electric blankets, heating pads, computers, and microwave ovens. When using your cell phone, hold it to the ear that is on the opposite side from the defibrillator. Do not leave your cell phone in a pocket over the defibrillator. General instructions Follow diet instructions from your health care provider, if this applies. Always keep your defibrillator ID card with you. The card should list the implant date, device model, and manufacturer. Consider wearing a medical alert bracelet or necklace. Have your defibrillator checked  every 3-6 months or as often as told by your health care provider. Most defibrillators last for 4-8 years. Keep all follow-up visits as told by your health care provider. This is important for your health care provider to make sure your chest is healing the way it should. Ask your health care provider when you should come back to have your stitches or staples taken out. Contact a health care provider if: You feel one shock in your chest. You gain weight suddenly. Your legs or feet swell more than they have before. It feels like your heart is fluttering or skipping beats (heart palpitations). You have more redness, swelling, or pain around your incision. You have more fluid or blood coming from your incision. Your incision feels warm to the touch. You have pus or a bad smell coming from your incision. You have a fever. Get help right away if: You have chest pain. You feel more than one shock. You feel more short of breath than you have felt before. You feel more light-headed than you have felt before. Your incision starts to open up. This information is not intended to replace advice given to you by your health care provider. Make sure you discuss any questions you have with your health care provider. Document Released: 04/29/2005 Document Revised: 04/29/2016 Document Reviewed: 03/16/2016 Elsevier Interactive Patient Education  2018 [place] Discharge Instructions for  Pacemaker/Defibrillator Patients  ACTIVITY  No heavy lifting or vigorous activity with your left/right arm for 6 to 8 weeks.  Do not raise your left/right arm above your head for one week.  Gradually raise your affected arm as drawn below.           __  NO DRIVING for     ; you may begin driving on     .  WOUND CARE Keep the wound area clean and dry.  Do not get this area wet for one week. No showers for one week; you may shower on     . The tape/steri-strips on your wound will fall off; do  not pull them off.  No bandage is needed on the site.  DO  NOT apply any creams, oils, or ointments to the wound area. If you notice any drainage or discharge from the wound, any swelling or bruising at the site, or you develop a fever > 101? F after you are discharged home, call the office at once.  SPECIAL INSTRUCTIONS You are still able to use cellular telephones; use the ear opposite the side where you have your pacemaker/defibrillator.  Avoid carrying your cellular phone near your device. When traveling through airports, show security personnel your identification card to avoid being screened in the metal detectors.  Ask the security personnel to use the hand wand. Avoid arc welding equipment, MRI testing (magnetic resonance imaging), TENS units (transcutaneous nerve stimulators).  Call the office for questions about other devices. Avoid electrical appliances that are in poor condition or are not properly grounded. Microwave ovens are safe to be near or to operate.  ADDITIONAL INFORMATION FOR DEFIBRILLATOR PATIENTS SHOULD YOUR DEVICE GO OFF: If your device goes off ONCE and you feel fine afterward, notify the device clinic nurses. If your device goes off ONCE and you do not feel well afterward, call 911. If your device goes off TWICE, call 911. If your device goes off THREE TIMES IN ONE DAY, call 911.  DO NOT DRIVE YOURSELF OR A FAMILY MEMBER WITH A DEFIBRILLATOR TO THE HOSPITAL--CALL 911.

## 2021-09-03 ENCOUNTER — Ambulatory Visit: Payer: Medicare Other | Admitting: Oncology

## 2021-09-03 ENCOUNTER — Other Ambulatory Visit: Payer: Medicare Other

## 2021-09-03 ENCOUNTER — Inpatient Hospital Stay: Payer: Medicare Other | Admitting: Oncology

## 2021-09-03 ENCOUNTER — Inpatient Hospital Stay: Payer: Medicare Other

## 2021-09-08 ENCOUNTER — Other Ambulatory Visit: Payer: Self-pay

## 2021-09-10 ENCOUNTER — Other Ambulatory Visit: Payer: Self-pay

## 2021-09-10 ENCOUNTER — Ambulatory Visit: Payer: Medicare Other | Attending: Internal Medicine

## 2021-09-10 ENCOUNTER — Inpatient Hospital Stay (HOSPITAL_BASED_OUTPATIENT_CLINIC_OR_DEPARTMENT_OTHER): Payer: Medicare Other | Admitting: Oncology

## 2021-09-10 ENCOUNTER — Other Ambulatory Visit (HOSPITAL_BASED_OUTPATIENT_CLINIC_OR_DEPARTMENT_OTHER): Payer: Self-pay

## 2021-09-10 ENCOUNTER — Inpatient Hospital Stay: Payer: Medicare Other | Attending: Oncology

## 2021-09-10 ENCOUNTER — Telehealth: Payer: Self-pay

## 2021-09-10 DIAGNOSIS — Z23 Encounter for immunization: Secondary | ICD-10-CM | POA: Diagnosis not present

## 2021-09-10 LAB — CBC WITH DIFFERENTIAL (CANCER CENTER ONLY)
Abs Immature Granulocytes: 0.02 10*3/uL (ref 0.00–0.07)
Basophils Absolute: 0 10*3/uL (ref 0.0–0.1)
Basophils Relative: 1 %
Eosinophils Absolute: 0.2 10*3/uL (ref 0.0–0.5)
Eosinophils Relative: 3 %
HCT: 46.9 % (ref 39.0–52.0)
Hemoglobin: 16.5 g/dL (ref 13.0–17.0)
Immature Granulocytes: 0 %
Lymphocytes Relative: 27 %
Lymphs Abs: 1.8 10*3/uL (ref 0.7–4.0)
MCH: 32.4 pg (ref 26.0–34.0)
MCHC: 35.2 g/dL (ref 30.0–36.0)
MCV: 92.1 fL (ref 80.0–100.0)
Monocytes Absolute: 0.7 10*3/uL (ref 0.1–1.0)
Monocytes Relative: 10 %
Neutro Abs: 4 10*3/uL (ref 1.7–7.7)
Neutrophils Relative %: 59 %
Platelet Count: 192 10*3/uL (ref 150–400)
RBC: 5.09 MIL/uL (ref 4.22–5.81)
RDW: 13.6 % (ref 11.5–15.5)
WBC Count: 6.8 10*3/uL (ref 4.0–10.5)
nRBC: 0 % (ref 0.0–0.2)

## 2021-09-10 LAB — FERRITIN: Ferritin: 80 ng/mL (ref 24–336)

## 2021-09-10 MED ORDER — FLUAD QUADRIVALENT 0.5 ML IM PRSY
PREFILLED_SYRINGE | INTRAMUSCULAR | 0 refills | Status: DC
  Filled 2021-09-10: qty 0.5, 1d supply, fill #0

## 2021-09-10 MED ORDER — PFIZER COVID-19 VAC BIVALENT 30 MCG/0.3ML IM SUSP
INTRAMUSCULAR | 0 refills | Status: DC
Start: 2021-09-10 — End: 2021-10-05
  Filled 2021-09-10: qty 0.3, 1d supply, fill #0

## 2021-09-10 NOTE — Telephone Encounter (Signed)
-----   Message from [name], MD sent at 09/10/2021  3:40 PM EST ----- Please call patient, ferritin slightly higher, but remains in goal range (less than 100), follow-up ferritin level in 4 months as scheduled

## 2021-09-10 NOTE — Progress Notes (Signed)
   Covid-19 Vaccination Clinic  Name:  Brett [name]    MRN: [id number] DOB: [name], 1947  09/10/2021  [name] was observed post Covid-19 immunization for 15 minutes without incident. He was provided with Vaccine Information Sheet and instruction to access the V-Safe system.   [name] was instructed to call 911 with any severe reactions post vaccine: Difficulty breathing  Swelling of face and throat  A fast heartbeat  A bad rash all over body  Dizziness and weakness   Immunizations Administered     Name Date Dose VIS Date Route   Pfizer Covid-19 Vaccine Bivalent Booster 09/10/2021 10:09 AM 0.3 mL 06/23/2021 Intramuscular   Manufacturer: [place]   Lot: [id number]   [place]: [phone]

## 2021-09-10 NOTE — Progress Notes (Signed)
  [place] OFFICE PROGRESS NOTE   Diagnosis: Hemochromatosis  INTERVAL HISTORY:   Brett [name] returns as scheduled.  He is here with his daughter.  He was admitted in June with heart failure.  He is followed by cardiology and reports he is being scheduled for ICD placement.  No evidence of cardiac hemochromatosis on an MRI.    Objective:  Vital signs in last 24 hours:  Blood pressure 123/70, pulse 70, temperature 97.8 F (36.6 C), temperature source Oral, resp. rate 18, height 5\' 5"  (1.651 m), weight 210 lb (95.3 kg), SpO2 99 %.    Resp: Lungs clear bilaterally Cardio: Regular rate and rhythm, distant heart sounds GI: No hepatosplenomegaly Vascular: No leg edema   Lab Results:  Lab Results  Component Value Date   WBC 6.8 09/10/2021   HGB 16.5 09/10/2021   HCT 46.9 09/10/2021   MCV 92.1 09/10/2021   PLT 192 09/10/2021   NEUTROABS 4.0 09/10/2021    CMP  Lab Results  Component Value Date   NA 139 08/19/2021   K 4.4 08/19/2021   CL 104 08/19/2021   CO2 26 08/19/2021   GLUCOSE 233 (H) 08/19/2021   BUN 25 08/19/2021   CREATININE 1.31 (H) 08/19/2021   CALCIUM 9.5 08/19/2021   PROT 6.4 (L) 03/25/2021   ALBUMIN 3.9 03/25/2021   AST 20 03/25/2021   ALT 28 03/25/2021   ALKPHOS 33 (L) 03/25/2021   BILITOT 1.1 03/25/2021   GFRNONAA 53 (L) 03/31/2021   GFRAA 80 09/16/2020     Medications: I have reviewed the patient's current medications.   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 He was last treated with phlebotomy therapy on 09/16/2019 History of tobacco use in the remote past. Hypertension. Hypercholesterolemia. Diabetes. Gout. Sleep apnea. Family history of hemochromatosis.    Disposition: Brett [name] has a history of hereditary hemochromatosis.  He last underwent phlebotomy therapy in November 2020.  The ferritin level was in goal range when last checked in July of this year.  We will  follow-up on the ferritin level from today.  He will return in 4 months and 8 months for a ferritin level.  He will be scheduled for an office visit in 1 year.  He is now followed by cardiology for management of dilated cardiomyopathy and congestive heart failure.  I doubt the hemochromatosis contributed to the heart failure as his iron level has been under good control for many years.  I encouraged him to obtain an influenza and COVID-19 vaccine.    Betsy Coder, MD  09/10/2021  9:02 AM

## 2021-09-10 NOTE — Telephone Encounter (Signed)
Left message with information per Dr Benay Spice. Informed to return call in any questions or concerns.

## 2021-09-13 ENCOUNTER — Ambulatory Visit (HOSPITAL_COMMUNITY): Payer: Medicare Other

## 2021-09-13 ENCOUNTER — Encounter (HOSPITAL_COMMUNITY)
Admission: RE | Disposition: A | Discharge status: Home / Self Care | Payer: Self-pay | Source: Home / Self Care | Attending: Cardiology

## 2021-09-13 ENCOUNTER — Other Ambulatory Visit: Payer: Self-pay

## 2021-09-13 ENCOUNTER — Ambulatory Visit (HOSPITAL_COMMUNITY)
Admission: RE | Admit: 2021-09-13 | Discharge: 2021-09-13 | Disposition: A | Discharge status: Home / Self Care | Payer: Medicare Other | Attending: Cardiology | Admitting: Cardiology

## 2021-09-13 DIAGNOSIS — I428 Other cardiomyopathies: Secondary | ICD-10-CM

## 2021-09-13 DIAGNOSIS — I447 Left bundle-branch block, unspecified: Secondary | ICD-10-CM | POA: Diagnosis not present

## 2021-09-13 DIAGNOSIS — I420 Dilated cardiomyopathy: Secondary | ICD-10-CM | POA: Diagnosis not present

## 2021-09-13 DIAGNOSIS — I5022 Chronic systolic (congestive) heart failure: Secondary | ICD-10-CM | POA: Insufficient documentation

## 2021-09-13 DIAGNOSIS — Z95818 Presence of other cardiac implants and grafts: Secondary | ICD-10-CM

## 2021-09-13 DIAGNOSIS — E119 Type 2 diabetes mellitus without complications: Secondary | ICD-10-CM | POA: Insufficient documentation

## 2021-09-13 DIAGNOSIS — I110 Hypertensive heart disease with heart failure: Secondary | ICD-10-CM | POA: Insufficient documentation

## 2021-09-13 DIAGNOSIS — Z95810 Presence of automatic (implantable) cardiac defibrillator: Secondary | ICD-10-CM | POA: Diagnosis not present

## 2021-09-13 DIAGNOSIS — Z79899 Other long term (current) drug therapy: Secondary | ICD-10-CM | POA: Diagnosis not present

## 2021-09-13 HISTORY — PX: BIV ICD INSERTION CRT-D: EP1195

## 2021-09-13 LAB — GLUCOSE, CAPILLARY
Glucose-Capillary: 89 mg/dL (ref 70–99)
Glucose-Capillary: 88 mg/dL (ref 70–99)

## 2021-09-13 IMAGING — DX DG CHEST 2V
2 series · 2 of 2 positions shown · non-contrast
Comparison: [DATE].

CLINICAL DATA: New ICD insertion.

EXAM:
CHEST - 2 VIEW

[w chest pa]
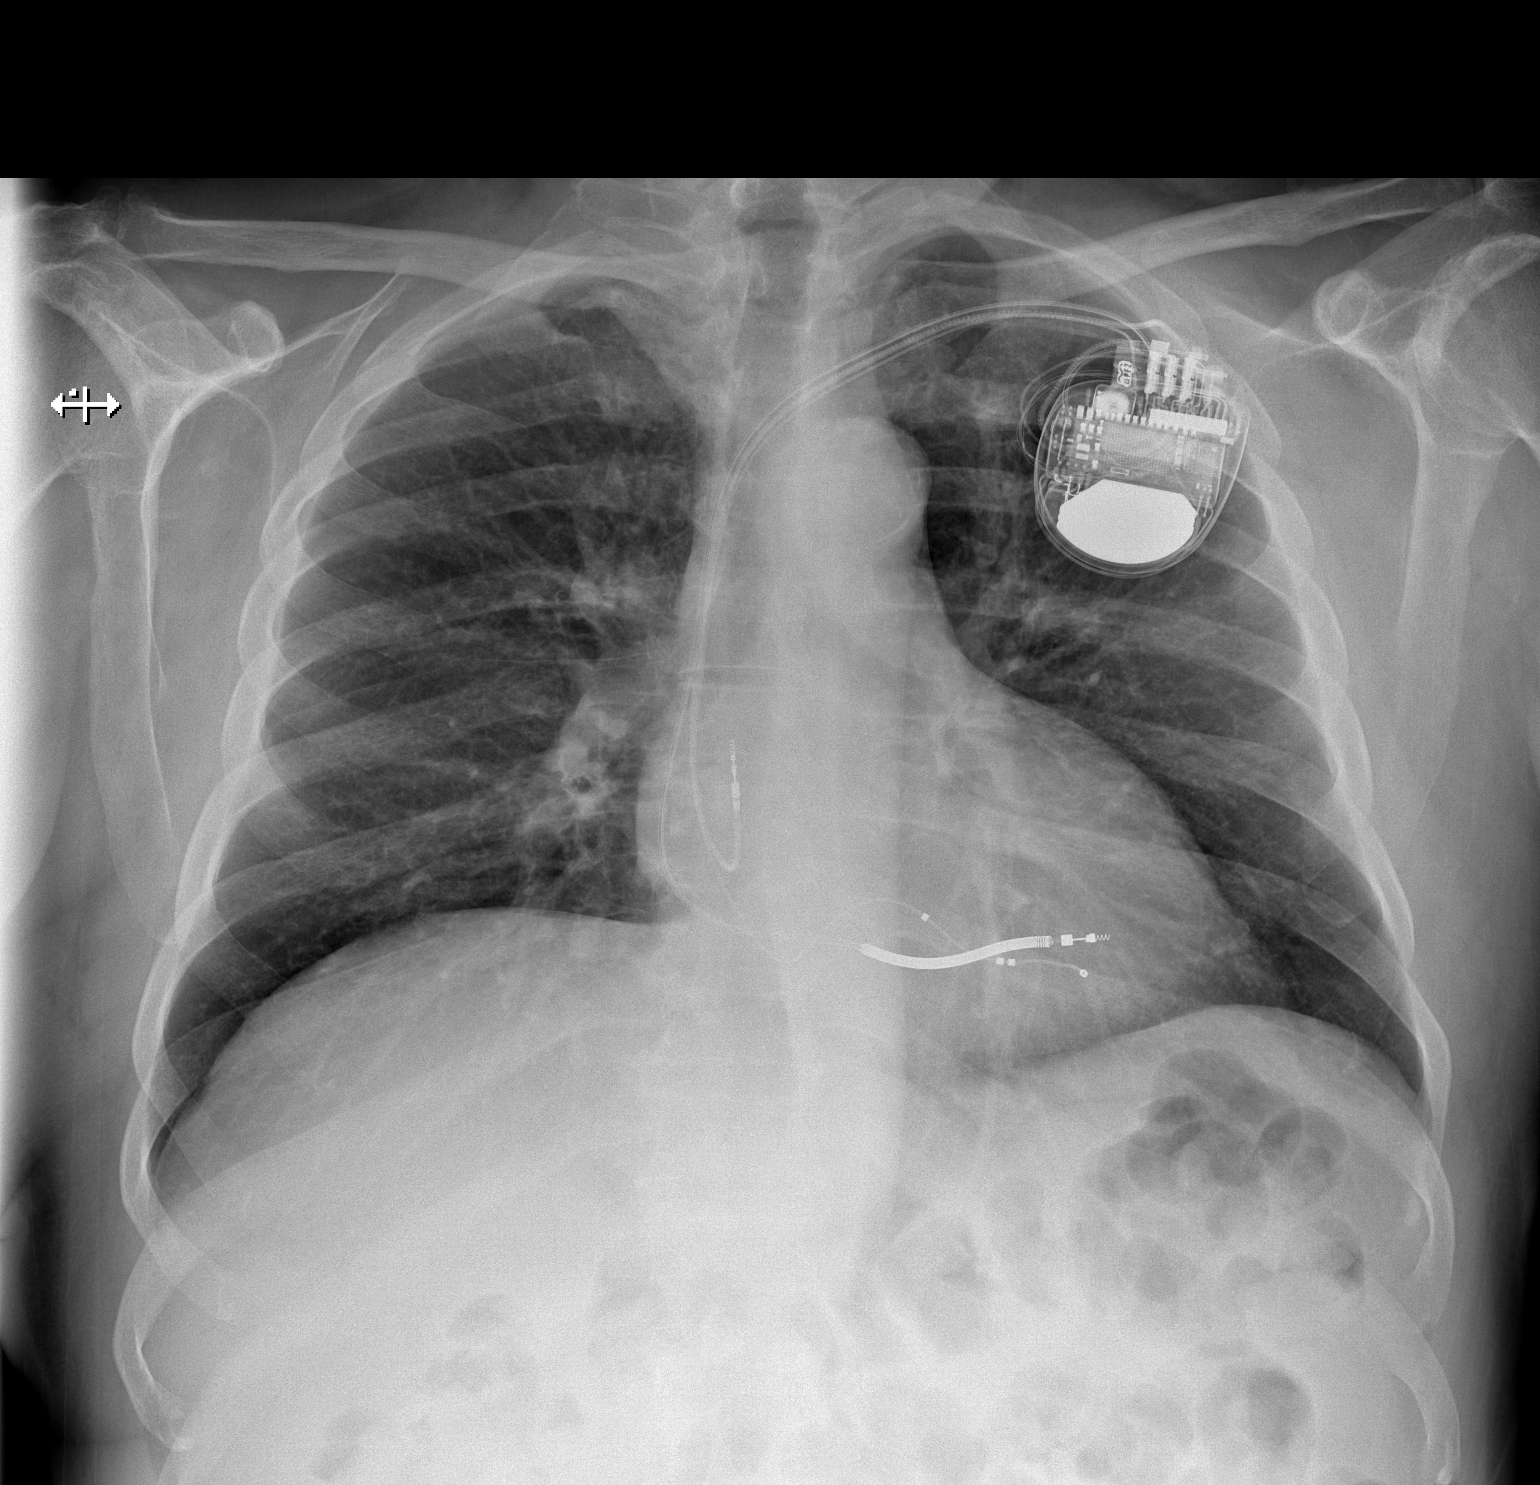

[w chest lat]
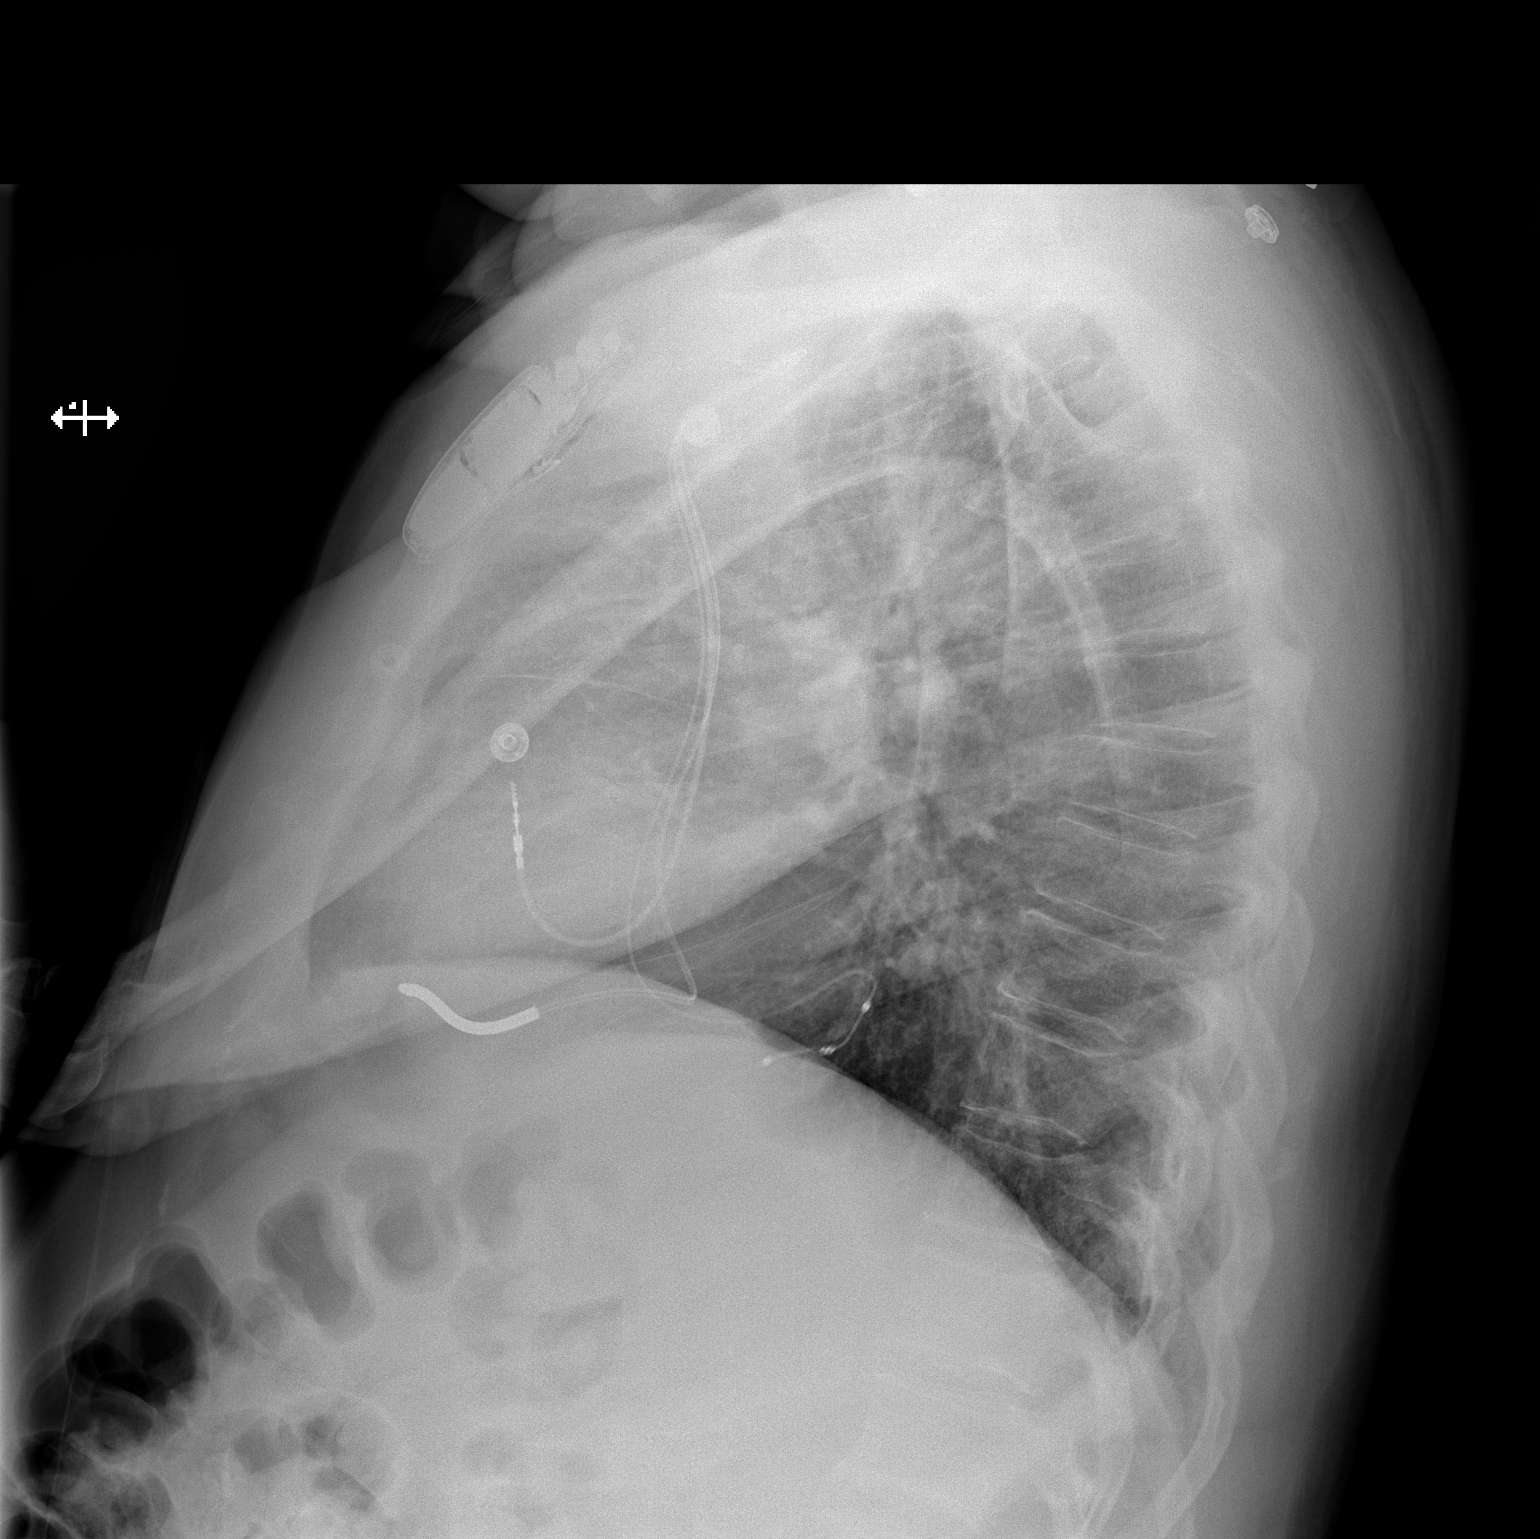

[2 of 2 positions shown; findings below may reference images not displayed]

FINDINGS: Trachea is midline. Heart size normal. Interval placement of a
pacemaker/ICD with lead tips in right atrium, right ventricle and
expected location of the coronary sinus. Lungs are clear. No pleural
fluid. No pneumothorax.
IMPRESSION: Interval placement of a pacemaker/ICD without complicating feature.

## 2021-09-13 SURGERY — BIV ICD INSERTION CRT-D

## 2021-09-13 MED ORDER — IOHEXOL 350 MG/ML SOLN
INTRAVENOUS | Status: DC | PRN
  Administered 2021-09-13: 10 mL

## 2021-09-13 MED ORDER — FENTANYL CITRATE (PF) 100 MCG/2ML IJ SOLN
INTRAMUSCULAR | Status: AC
  Filled 2021-09-13: qty 2

## 2021-09-13 MED ORDER — LIDOCAINE HCL (PF) 1 % IJ SOLN
INTRAMUSCULAR | Status: DC | PRN
  Administered 2021-09-13: 60 mL

## 2021-09-13 MED ORDER — ACETAMINOPHEN 325 MG PO TABS
325.0000 mg | ORAL_TABLET | ORAL | Status: DC | PRN
  Administered 2021-09-13: 650 mg via ORAL
  Filled 2021-09-13 (×3): qty 2

## 2021-09-13 MED ORDER — CEFAZOLIN SODIUM-DEXTROSE 2-4 GM/100ML-% IV SOLN
2.0000 g | INTRAVENOUS | Status: AC
  Administered 2021-09-13: 2 g via INTRAVENOUS
  Filled 2021-09-13: qty 100

## 2021-09-13 MED ORDER — FENTANYL CITRATE (PF) 100 MCG/2ML IJ SOLN
INTRAMUSCULAR | Status: DC | PRN
  Administered 2021-09-13 (×2): 25 ug via INTRAVENOUS

## 2021-09-13 MED ORDER — SODIUM CHLORIDE 0.9 % IV SOLN
80.0000 mg | INTRAVENOUS | Status: AC
  Administered 2021-09-13: 80 mg
  Filled 2021-09-13: qty 2

## 2021-09-13 MED ORDER — CHLORHEXIDINE GLUCONATE 4 % EX LIQD
4.0000 "application " | Freq: Once | CUTANEOUS | Status: DC
  Filled 2021-09-13: qty 60

## 2021-09-13 MED ORDER — MIDAZOLAM HCL 5 MG/5ML IJ SOLN
INTRAMUSCULAR | Status: DC | PRN
  Administered 2021-09-13 (×2): 1 mg via INTRAVENOUS

## 2021-09-13 MED ORDER — CEFAZOLIN SODIUM-DEXTROSE 1-4 GM/50ML-% IV SOLN
1.0000 g | Freq: Four times a day (QID) | INTRAVENOUS | Status: DC
  Administered 2021-09-13: 1 g via INTRAVENOUS
  Filled 2021-09-13 (×2): qty 50

## 2021-09-13 MED ORDER — SODIUM CHLORIDE 0.9 % IV SOLN
INTRAVENOUS | Status: DC | PRN
  Administered 2021-09-13: 20 mL/h via INTRAVENOUS

## 2021-09-13 MED ORDER — SODIUM CHLORIDE 0.9 % IV SOLN
INTRAVENOUS | Status: AC
  Filled 2021-09-13: qty 2

## 2021-09-13 MED ORDER — HEPARIN (PORCINE) IN NACL 1000-0.9 UT/500ML-% IV SOLN
INTRAVENOUS | Status: DC | PRN
  Administered 2021-09-13: 500 mL

## 2021-09-13 MED ORDER — HEPARIN (PORCINE) IN NACL 1000-0.9 UT/500ML-% IV SOLN
INTRAVENOUS | Status: AC
  Filled 2021-09-13: qty 500

## 2021-09-13 MED ORDER — MIDAZOLAM HCL 5 MG/5ML IJ SOLN
INTRAMUSCULAR | Status: AC
  Filled 2021-09-13: qty 5

## 2021-09-13 MED ORDER — ONDANSETRON HCL 4 MG/2ML IJ SOLN: 4.0000 mg | Freq: Four times a day (QID) | INTRAMUSCULAR | Status: DC | PRN

## 2021-09-13 MED ORDER — CEFAZOLIN SODIUM-DEXTROSE 2-4 GM/100ML-% IV SOLN
INTRAVENOUS | Status: AC
  Filled 2021-09-13: qty 100

## 2021-09-13 MED ORDER — SODIUM CHLORIDE 0.9 % IV SOLN: INTRAVENOUS | Status: DC

## 2021-09-13 SURGICAL SUPPLY — 16 items
BALLN ATTAIN 80 (BALLOONS) ×2
BALLOON ATTAIN 80 (BALLOONS) IMPLANT
CABLE SURGICAL S-101-97-12 (CABLE) ×2 IMPLANT
CATH ATTAIN COM SURV 6250V-MB2 (CATHETERS) ×1 IMPLANT
ICD CLARIA MRI DTMA1QQ (ICD Generator) ×1 IMPLANT
LEAD ATTAIN PERFORMA S 4598-88 (Lead) ×1 IMPLANT
LEAD CAPSURE NOVUS 5076-52CM (Lead) ×1 IMPLANT
LEAD SPRINT QUAT SEC 6935M-62 (Lead) ×1 IMPLANT
PAD DEFIB RADIO PHYSIO CONN (PAD) ×2 IMPLANT
SHEATH 7FR PRELUDE SNAP 13 (SHEATH) ×1 IMPLANT
SHEATH 9.5FR PRELUDE SNAP 13 (SHEATH) ×1 IMPLANT
SHEATH 9FR PRELUDE SNAP 13 (SHEATH) ×1 IMPLANT
SLITTER 6232ADJ (MISCELLANEOUS) ×1 IMPLANT
TRAY PACEMAKER INSERTION (PACKS) ×2 IMPLANT
WIRE ACUITY WHISPER EDS 4648 (WIRE) ×1 IMPLANT
WIRE HI TORQ VERSACORE-J 145CM (WIRE) ×1 IMPLANT

## 2021-09-13 NOTE — Discharge Instructions (Signed)
After Your ICD (Implantable Cardiac Defibrillator)   You have a [employer] ICD  ACTIVITY Do not lift your arm above shoulder height for 1 week after your procedure. After 7 days, you may progress as below.  You should remove your sling 24 hours after your procedure, unless otherwise instructed by your provider.     Monday September 20, 2021  Tuesday September 21, 2021 Wednesday September 22, 2021 Thursday September 23, 2021   Do not lift, push, pull, or carry anything over 10 pounds with the affected arm until 6 weeks (Monday October 25, 2021 ) after your procedure.   You may drive AFTER your wound check, unless you have been told otherwise by your provider.   Ask your healthcare provider when you can go back to work   INCISION/Dressing If you are on a blood thinner such as Coumadin, Xarelto, Eliquis, Plavix, or Pradaxa please confirm with your provider when this should be resumed.   If large square, outer bandage is left in place, this can be removed after 24 hours from your procedure. Do not remove steri-strips or glue as below.   Monitor your defibrillator site for redness, swelling, and drainage. Call the device clinic at [phone] if you experience these symptoms or fever/chills.  If your incision is sealed with Steri-strips or staples, you may shower 10 days after your procedure or when told by your provider. Do not remove the steri-strips or let the shower hit directly on your site. You may wash around your site with soap and water.    If you were discharged in a sling, please do not wear this during the day more than 48 hours after your surgery unless otherwise instructed. This may increase the risk of stiffness and soreness in your shoulder.   Avoid lotions, ointments, or perfumes over your incision until it is well-healed.  You may use a hot tub or a pool AFTER your wound check appointment if the incision is completely closed.  Your ICD is designed to protect you from life  threatening heart rhythms. Because of this, you may receive a shock.   1 shock with no symptoms:  Call the office during business hours. 1 shock with symptoms (chest pain, chest pressure, dizziness, lightheadedness, shortness of breath, overall feeling unwell):  Call 911. If you experience 2 or more shocks in 24 hours:  Call 911. If you receive a shock, you should not drive for 6 months per the [place] DMV IF you receive appropriate therapy from your ICD.   ICD Alerts:  Some alerts are vibratory and others beep. These are NOT emergencies. Please call our office to let us know. If this occurs at night or on weekends, it can wait until the next business day. Send a remote transmission.  If your device is capable of reading fluid status (for heart failure), you will be offered monthly monitoring to review this with you.   DEVICE MANAGEMENT Remote monitoring is used to monitor your ICD from home. This monitoring is scheduled every 91 days by our office. It allows [place] to keep an eye on the functioning of your device to ensure it is working properly. You will routinely see your Electrophysiologist annually (more often if necessary).   You should receive your ID card for your new device in 4-8 weeks. Keep this card with you at all times once received. Consider wearing a medical alert bracelet or necklace.  Your ICD  may be MRI compatible. This will be discussed at your next  office visit/wound check.  You should avoid contact with strong electric or magnetic fields.   Do not use amateur (ham) radio equipment or electric (arc) welding torches. MP3 player headphones with magnets should not be used. Some devices are safe to use if held at least 12 inches (30 cm) from your defibrillator. These include power tools, lawn mowers, and speakers. If you are unsure if something is safe to use, ask your health care provider.  When using your cell phone, hold it to the ear that is on the opposite side from the  defibrillator. Do not leave your cell phone in a pocket over the defibrillator.  You may safely use electric blankets, heating pads, computers, and microwave ovens.  Call the office right away if: You have chest pain. You feel more than one shock. You feel more short of breath than you have felt before. You feel more light-headed than you have felt before. Your incision starts to open up.  This information is not intended to replace advice given to you by your health care provider. Make sure you discuss any questions you have with your health care provider.

## 2021-09-13 NOTE — Interval H&P Note (Signed)
History and Physical Interval Note:  09/13/2021 7:08 AM  Brett [name]  has presented today for surgery, with the diagnosis of cardiomyopathy.  The various methods of treatment have been discussed with the patient and family. After consideration of risks, benefits and other options for treatment, the patient has consented to  Procedure(s): BIV ICD INSERTION CRT-D (N/A) as a surgical intervention.  The patient's history has been reviewed, patient examined, no change in status, stable for surgery.  I have reviewed the patient's chart and labs.  Questions were answered to the patient's satisfaction.     Brett [name]  ICD Criteria  Current LVEF:30%. Within 12 months prior to implant: Yes   Heart failure history: Yes, Class II  Cardiomyopathy history: Yes, Non-Ischemic Cardiomyopathy.  Atrial Fibrillation/Atrial Flutter: No.  Ventricular tachycardia history: No.  Cardiac arrest history: No.  History of syndromes with risk of sudden death: No.  Previous ICD: No.  Current ICD indication: Primary  PPM indication: No.  Class I or II Bradycardia indication present: No  Beta Blocker therapy for 3 or more months: Yes, prescribed.   Ace Inhibitor/ARB therapy for 3 or more months: Yes, prescribed.    I have seen Brett [name] is a 75 y.o. malepre-procedural and has been referred by [facility] for consideration of ICD implant for primary prevention of sudden death.  The patient's chart has been reviewed and they meet criteria for ICD implant.  I have had a thorough discussion with the patient reviewing options.  The patient and their family (if available) have had opportunities to ask questions and have them answered. The patient and I have decided together through the [place] [employer] to implant ICD at this time.  Risks, benefits, alternatives to ICD implantation were discussed in detail with the patient today. The patient  understands that the risks  include but are not limited to bleeding, infection, pneumothorax, perforation, tamponade, vascular damage, renal failure, MI, stroke, death, inappropriate shocks, and lead dislodgement and wishes to proceed.

## 2021-09-14 ENCOUNTER — Telehealth: Payer: Self-pay

## 2021-09-14 ENCOUNTER — Encounter (HOSPITAL_COMMUNITY): Payer: Self-pay | Admitting: Cardiology

## 2021-09-14 NOTE — Telephone Encounter (Signed)
-----   Message from [facility] [place] sent at 09/14/2021 10:37 AM EST ----- Same day d/c Monday 11/21  [facility] MDT ICD

## 2021-09-14 NOTE — Telephone Encounter (Signed)
Attempted to speak with patient, bad connection, unable to understand him.  Spoke with pt daughter who was not with pt at time but states he is doing well today, relaxing on couch.    Follow-up after same day discharge: Implant date: 09/13/21 MD: Allegra Lai, MD Device: MDT CRT-D Location: Left chest   Wound check visit: 09/23/21 90 day MD follow-up: 12/21/21  Remote Transmission received:Yes- activation  Dressing removed: Not yet, pt daughter will have him remove tonight.    Reviewed s/s of infection to monitor for and report.

## 2021-09-23 ENCOUNTER — Other Ambulatory Visit: Payer: Self-pay

## 2021-09-23 ENCOUNTER — Ambulatory Visit (INDEPENDENT_AMBULATORY_CARE_PROVIDER_SITE_OTHER): Payer: Medicare Other

## 2021-09-23 DIAGNOSIS — I429 Cardiomyopathy, unspecified: Secondary | ICD-10-CM | POA: Diagnosis not present

## 2021-09-23 LAB — CUP PACEART INCLINIC DEVICE CHECK
Battery Remaining Longevity: 108 mo
Battery Voltage: 3.06 V
Brady Statistic AP VP Percent: 45.71 %
Brady Statistic AP VS Percent: 0.74 %
Brady Statistic AS VP Percent: 51.87 %
Brady Statistic AS VS Percent: 1.68 %
Brady Statistic RA Percent Paced: 42.95 %
Brady Statistic RV Percent Paced: 8.12 %
Date Time Interrogation Session: 20221201135225
HighPow Impedance: 60 Ohm
Implantable Lead Implant Date: 20221121
Implantable Lead Implant Date: 20221121
Implantable Lead Implant Date: 20221121
Implantable Lead Location: 753858
Implantable Lead Location: 753859
Implantable Lead Location: 753860
Implantable Lead Model: 4598
Implantable Lead Model: 5076
Implantable Pulse Generator Implant Date: 20221121
Lead Channel Impedance Value: 159.6 Ohm
Lead Channel Impedance Value: 159.6 Ohm
Lead Channel Impedance Value: 172.9 Ohm
Lead Channel Impedance Value: 220.723
Lead Channel Impedance Value: 220.723
Lead Channel Impedance Value: 266 Ohm
Lead Channel Impedance Value: 342 Ohm
Lead Channel Impedance Value: 399 Ohm
Lead Channel Impedance Value: 399 Ohm
Lead Channel Impedance Value: 399 Ohm
Lead Channel Impedance Value: 437 Ohm
Lead Channel Impedance Value: 494 Ohm
Lead Channel Impedance Value: 589 Ohm
Lead Channel Impedance Value: 589 Ohm
Lead Channel Impedance Value: 627 Ohm
Lead Channel Impedance Value: 646 Ohm
Lead Channel Impedance Value: 760 Ohm
Lead Channel Impedance Value: 779 Ohm
Lead Channel Pacing Threshold Amplitude: 0.75 V
Lead Channel Pacing Threshold Amplitude: 1 V
Lead Channel Pacing Threshold Amplitude: 1 V
Lead Channel Pacing Threshold Pulse Width: 0.4 ms
Lead Channel Pacing Threshold Pulse Width: 0.4 ms
Lead Channel Pacing Threshold Pulse Width: 0.4 ms
Lead Channel Sensing Intrinsic Amplitude: 18.75 mV
Lead Channel Sensing Intrinsic Amplitude: 3.25 mV
Lead Channel Setting Pacing Amplitude: 2 V
Lead Channel Setting Pacing Amplitude: 3.5 V
Lead Channel Setting Pacing Amplitude: 3.5 V
Lead Channel Setting Pacing Pulse Width: 0.4 ms
Lead Channel Setting Pacing Pulse Width: 0.4 ms
Lead Channel Setting Sensing Sensitivity: 0.3 mV

## 2021-09-23 NOTE — Patient Instructions (Signed)
   After Your ICD (Implantable Cardiac Defibrillator)    Monitor your defibrillator site for redness, swelling, and drainage. Call the device clinic at [phone] if you experience these symptoms or fever/chills.  Your incision was closed with Steri-strips or staples:  You may shower 7 days after your procedure and wash your incision with soap and water. Avoid lotions, ointments, or perfumes over your incision until it is well-healed.    You may use a hot tub or a pool after your wound check appointment if the incision is completely closed.  Do not lift, push or pull greater than 10 pounds with the affected arm until 6 weeks after your procedure. There are no other restrictions in arm movement after your wound check appointment.  Your ICD is MRI compatible.  Your ICD is designed to protect you from life threatening heart rhythms. Because of this, you may receive a shock.   1 shock with no symptoms:  Call the office during business hours. 1 shock with symptoms (chest pain, chest pressure, dizziness, lightheadedness, shortness of breath, overall feeling unwell):  Call 911. If you experience 2 or more shocks in 24 hours:  Call 911. If you receive a shock, you should not drive.  [place] DMV - no driving for 6 months if you receive appropriate therapy from your ICD.   ICD Alerts:  Some alerts are vibratory and others beep. These are NOT emergencies. Please call our office to let us know. If this occurs at night or on weekends, it can wait until the next business day. Send a remote transmission.  If your device is capable of reading fluid status (for heart failure), you will be offered monthly monitoring to review this with you.   Remote monitoring is used to monitor your ICD from home. This monitoring is scheduled every 91 days by our office. It allows us to keep an eye on the functioning of your device to ensure it is working properly. You will routinely see your Electrophysiologist annually  (more often if necessary).   

## 2021-09-23 NOTE — Progress Notes (Signed)
Wound check appointment. Steri-strips removed. Wound without redness or edema. Incision edges approximated, wound well healed. Normal device function. Thresholds, sensing, and impedances consistent with implant measurements. Device programmed at 3.5V for extra safety margin until 3 month visit. Histogram distribution appropriate for patient and level of activity. No mode switches or ventricular arrhythmias noted. Patient bi-ventricularly pacing 91.13% of the time.  Patient educated about wound care, arm mobility, lifting restrictions, shock plan. ROV with Dr. Curt Bears on 12/21/21.  Patient is enrolled in remote monitoring, next scheduled check 12/13/21.

## 2021-09-24 ENCOUNTER — Ambulatory Visit (INDEPENDENT_AMBULATORY_CARE_PROVIDER_SITE_OTHER): Payer: Medicare Other | Admitting: Cardiology

## 2021-09-24 ENCOUNTER — Encounter: Payer: Self-pay | Admitting: Cardiology

## 2021-09-24 VITALS — BP 112/64 | HR 70 | Ht 65.0 in | Wt 209.0 lb

## 2021-09-24 DIAGNOSIS — N182 Chronic kidney disease, stage 2 (mild): Secondary | ICD-10-CM

## 2021-09-24 DIAGNOSIS — I447 Left bundle-branch block, unspecified: Secondary | ICD-10-CM | POA: Diagnosis not present

## 2021-09-24 DIAGNOSIS — I5020 Unspecified systolic (congestive) heart failure: Secondary | ICD-10-CM | POA: Diagnosis not present

## 2021-09-24 DIAGNOSIS — E1122 Type 2 diabetes mellitus with diabetic chronic kidney disease: Secondary | ICD-10-CM

## 2021-09-24 DIAGNOSIS — I420 Dilated cardiomyopathy: Secondary | ICD-10-CM

## 2021-09-24 NOTE — Patient Instructions (Signed)

## 2021-09-24 NOTE — Progress Notes (Signed)
Cardiology Office Note:    Date:  09/24/2021   ID:  [name], Brett 1946/07/28, MRN [id number]  PCP:  Azzie Glatter, FNP (Inactive)  Cardiologist:  Jenne Campus, MD    Referring MD: No ref. provider found   Chief Complaint  Patient presents with   Follow-up    History of Present Illness:    Brett [name] is a 75 y.o. 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  He comes today to my office for follow-up week ago he did have ICD implantation.  Seems to be tolerating procedure quite well wound is healing beautifully he said he may be feeling a little stronger but not convinced yet.  Denies have any chest pain tightness squeezing pressure burning chest there is no mild swelling of lower extremities.  Overall seems to be doing quite well.  Shortness of breath is still there no worsening  Past Medical History:  Diagnosis Date   Acid reflux    Allergy    Colon polyp    Diabetes mellitus without complication (Brett [name])    Dilated cardiomyopathy ([place])    non isch   Gout    Gout    Hemochromatosis    Hypertension    OSA (obstructive sleep apnea)    Urine frequency     Past Surgical History:  Procedure Laterality Date   BIV ICD INSERTION CRT-D N/A 09/13/2021   Procedure: BIV ICD INSERTION CRT-D;  Surgeon: Constance Haw, MD;  Location: [facility];  Service: Cardiovascular;  Laterality: N/A;   EYE SURGERY Bilateral    Cataract   RIGHT/LEFT HEART CATH AND CORONARY ANGIOGRAPHY N/A 03/29/2021   Procedure: RIGHT/LEFT HEART CATH AND CORONARY ANGIOGRAPHY;  Surgeon: Nigel Mormon, MD;  Location: [facility];  Service: Cardiovascular;  Laterality: N/A;    Current Medications: Current Meds  Medication Sig   allopurinol (ZYLOPRIM) 300 MG tablet TAKE 1 TABLET BY MOUTH  DAILY TO  PREVENT GOUT (Patient taking differently: Take 300 mg by mouth daily.)   aspirin 81 MG tablet Take 81 mg by mouth daily.   atorvastatin (LIPITOR) 20 MG tablet TAKE 1 TABLET BY MOUTH  DAILY FOR CHOLESTEROL (Patient taking differently: Take 20 mg by mouth daily.)   bisoprolol (ZEBETA) 10 MG tablet Take 1 tablet (10 mg total) by mouth daily.   Cholecalciferol (VITAMIN D PO) Take 5,000 Units by mouth daily.   COVID-19 mRNA bivalent vaccine, Pfizer, (PFIZER COVID-19 VAC BIVALENT) injection Inject into the muscle. (Patient taking differently: Inject 0.3 mLs into the muscle once.)   D-Mannose 500 MG CAPS Take 1,500 mg by mouth daily.   dapagliflozin propanediol (FARXIGA) 10 MG TABS tablet Take 1 tablet (10 mg total) by mouth daily.   famotidine (PEPCID) 40 MG tablet Take 40 mg by mouth daily.   furosemide (LASIX) 20 MG tablet Take 1 tablet (20 mg total) by mouth daily. TAKE 1 TABLET BY MOUTH  DAILY FOR BP, / FLUID  RETENTION / ANKLE SWELLING   glipiZIDE (GLUCOTROL) 10 MG tablet Take 1 tablet (10 mg total) by mouth daily before breakfast.   influenza vaccine adjuvanted (FLUAD QUADRIVALENT) 0.5 ML injection Inject into the muscle. (Patient taking differently: Inject 0.5 mLs into the muscle once.)   insulin isophane & regular human (HUMULIN 70/30 MIX) (70-30) 100 UNIT/ML KwikPen Inject into subcutaneous skin 41min before  meals, 45units breakfast, 40units lunch and 22units dinner. (Patient taking differently: Inject 20-45 Units into the skin in the morning, at noon, and at bedtime. Inject into subcutaneous skin 41min before meals, 45units breakfast, 25 units lunch and 20units dinner.)   Loratadine (CLARITIN PO) Take 1 tablet by mouth daily. Unknown strength   magnesium oxide (MAG-OX) 400 MG tablet Take 400 mg by mouth daily.   Melatonin 10 MG CAPS Take 10 mg by mouth at bedtime as needed (sleep).   metFORMIN (GLUCOPHAGE-XR) 500 MG 24 hr tablet TAKE 2 TABLETS BY MOUTH  TWICE DAILY WITH MEALS FOR  DIABETES  (Patient taking differently: Take 500 mg by mouth in the morning and at bedtime. TAKE 2 TABLETS BY MOUTH  TWICE DAILY WITH MEALS FOR  DIABETES)   sacubitril-valsartan (ENTRESTO) 49-51 MG Take 1 tablet by mouth 2 (two) times daily.   spironolactone (ALDACTONE) 25 MG tablet Take 1 tablet (25 mg total) by mouth daily.   tamsulosin (FLOMAX) 0.4 MG CAPS capsule TAKE 1 CAPSULE BY MOUTH AT  BEDTIME FOR PROSTATE (Patient taking differently: Take 0.4 mg by mouth at bedtime.)     Allergies:   Patient has no known allergies.   Social History   Socioeconomic History   Marital status: Divorced    Spouse name: Not on file   Number of children: Not on file   Years of education: Not on file   Highest education level: Not on file  Occupational History   Not on file  Tobacco Use   Smoking status: Former    Packs/day: 3.00    Years: 19.00    Pack years: 57.00    Types: Cigarettes    Start date: 10/24/1957    Quit date: 11/24/1976    Years since quitting: 44.8   Smokeless tobacco: Never  Substance and Sexual Activity   Alcohol use: Yes    Comment: Occasionally   Drug use: No   Sexual activity: Not on file  Other Topics Concern   Not on file  Social History Narrative   Pt lives w/ ex wife   Social Determinants of Health   Financial Resource Strain: Not on file  Food Insecurity: Not on file  Transportation Needs: Not on file  Physical Activity: Not on file  Stress: Not on file  Social Connections: Not on file     Family History: The patient's family history includes Cancer in his maternal uncle; Diabetes in his brother, father, and another family member; Heart attack in his brother; Heart disease in an other family member; Hyperlipidemia in an other family member; Hypertension in an other family member. ROS:   Please see the history of present illness.    All 14 point review of systems negative except as described per history of present illness  EKGs/Labs/Other Studies Reviewed:       Recent Labs: 03/25/2021: ALT 28 03/26/2021: TSH 3.764 03/28/2021: B Natriuretic Peptide 65.0 05/10/2021: Magnesium 2.4 08/19/2021: BUN 25; Creatinine, Ser 1.31; Potassium 4.4; Sodium 139 09/10/2021: Hemoglobin 16.5; Platelet Count 192  Recent Lipid Panel    Component Value Date/Time   CHOL 111 03/26/2021 0326   TRIG 42 03/26/2021 0326   HDL 39 (L) 03/26/2021 0326   CHOLHDL 2.8 03/26/2021 0326   VLDL 8 03/26/2021 0326   LDLCALC 64 03/26/2021 0326   LDLCALC 56 07/06/2020 1429    Physical Exam:    VS:  BP 112/64 (BP Location: Left Arm, Patient Position: Sitting)   Pulse 70   Ht 5\' 5"  (  1.651 m)   Wt 209 lb (94.8 kg)   SpO2 96%   BMI 34.78 kg/m     Wt Readings from Last 3 Encounters:  09/24/21 209 lb (94.8 kg)  09/13/21 202 lb (91.6 kg)  09/10/21 210 lb (95.3 kg)     GEN:  Well nourished, well developed in no acute distress HEENT: Normal NECK: No JVD; No carotid bruits LYMPHATICS: No lymphadenopathy CARDIAC: RRR, no murmurs, no rubs, no gallops RESPIRATORY:  Clear to auscultation without rales, wheezing or rhonchi  ABDOMEN: Soft, non-tender, non-distended MUSCULOSKELETAL:  No edema; No deformity  SKIN: Warm and dry LOWER EXTREMITIES: no swelling NEUROLOGIC:  Alert and oriented x 3 PSYCHIATRIC:  Normal affect   ASSESSMENT:    1. Dilated cardiomyopathy ([place])   2. LBBB (left bundle branch block)   3. HFrEF (heart failure with reduced ejection fraction) ([place])   4. CKD stage 2 due to type 2 diabetes mellitus ([place])   5. Hereditary hemochromatosis ([place])    PLAN:    In order of problems listed above:  Dilated cardiomyopathy on guideline directed medical therapy which I will continue for now. Left bundle branch block, BiV ICD has been implanted.  Tolerated procedure well, no complication.  Overall said that he feels probably better.  Will await for another few months before repeating echocardiogram I hope improvement left ventricle ejection fraction Heart failure with  reduced left ventricle ejection fraction he is very sedentary difficult to assess his new heart association classification.  We will continue guideline directed medical therapy Chronic kidney failure I did review K PN which show me data from 08/19/2021 with creatinine 1.3.   Medication Adjustments/Labs and Tests Ordered: Current medicines are reviewed at length with the patient today.  Concerns regarding medicines are outlined above.  No orders of the defined types were placed in this encounter.  Medication changes: No orders of the defined types were placed in this encounter.   Signed, Park Liter, MD, [facility] 09/24/2021 1:29 PM    [place]

## 2021-10-02 ENCOUNTER — Other Ambulatory Visit: Payer: Self-pay | Admitting: Adult Health

## 2021-10-05 ENCOUNTER — Other Ambulatory Visit: Payer: Self-pay

## 2021-10-05 ENCOUNTER — Ambulatory Visit (HOSPITAL_COMMUNITY)
Admission: RE | Admit: 2021-10-05 | Discharge: 2021-10-05 | Disposition: A | Discharge status: Home / Self Care | Payer: Medicare Other | Source: Ambulatory Visit | Attending: Cardiology | Admitting: Cardiology

## 2021-10-05 ENCOUNTER — Encounter (HOSPITAL_COMMUNITY): Payer: Self-pay | Admitting: Cardiology

## 2021-10-05 DIAGNOSIS — I110 Hypertensive heart disease with heart failure: Secondary | ICD-10-CM | POA: Diagnosis not present

## 2021-10-05 DIAGNOSIS — Z79899 Other long term (current) drug therapy: Secondary | ICD-10-CM | POA: Insufficient documentation

## 2021-10-05 DIAGNOSIS — Z95810 Presence of automatic (implantable) cardiac defibrillator: Secondary | ICD-10-CM | POA: Diagnosis not present

## 2021-10-05 DIAGNOSIS — Z87891 Personal history of nicotine dependence: Secondary | ICD-10-CM | POA: Insufficient documentation

## 2021-10-05 DIAGNOSIS — I428 Other cardiomyopathies: Secondary | ICD-10-CM

## 2021-10-05 DIAGNOSIS — I5020 Unspecified systolic (congestive) heart failure: Secondary | ICD-10-CM

## 2021-10-05 DIAGNOSIS — I5022 Chronic systolic (congestive) heart failure: Secondary | ICD-10-CM | POA: Diagnosis not present

## 2021-10-05 DIAGNOSIS — G4733 Obstructive sleep apnea (adult) (pediatric): Secondary | ICD-10-CM | POA: Diagnosis not present

## 2021-10-05 DIAGNOSIS — Z7984 Long term (current) use of oral hypoglycemic drugs: Secondary | ICD-10-CM | POA: Insufficient documentation

## 2021-10-05 DIAGNOSIS — I429 Cardiomyopathy, unspecified: Secondary | ICD-10-CM

## 2021-10-05 DIAGNOSIS — E119 Type 2 diabetes mellitus without complications: Secondary | ICD-10-CM | POA: Diagnosis not present

## 2021-10-05 DIAGNOSIS — R9431 Abnormal electrocardiogram [ECG] [EKG]: Secondary | ICD-10-CM | POA: Diagnosis not present

## 2021-10-05 LAB — BASIC METABOLIC PANEL
Anion gap: 12 (ref 5–15)
BUN: 31 mg/dL — ABNORMAL HIGH (ref 8–23)
CO2: 22 mmol/L (ref 22–32)
Calcium: 9.7 mg/dL (ref 8.9–10.3)
Chloride: 102 mmol/L (ref 98–111)
Creatinine, Ser: 1.17 mg/dL (ref 0.61–1.24)
GFR, Estimated: >60 mL/min (ref >60)
Glucose, Bld: 289 mg/dL — ABNORMAL HIGH (ref 70–99)
Potassium: 4.4 mmol/L (ref 3.5–5.1)
Sodium: 136 mmol/L (ref 135–145)

## 2021-10-05 LAB — BRAIN NATRIURETIC PEPTIDE: B Natriuretic Peptide: 158 pg/mL — ABNORMAL HIGH (ref 0.0–100.0)

## 2021-10-05 MED ORDER — ENTRESTO 97-103 MG PO TABS: 1.0000 | ORAL_TABLET | Freq: Two times a day (BID) | ORAL | 4 refills | Status: DC

## 2021-10-05 NOTE — Progress Notes (Addendum)
PCP: Azzie Glatter, FNP Cardiology: Dr. Agustin Cree HF Cardiology: Dr. Aundra Dubin  75 y.o. with history of chronic systolic CHF/nonischemic cardiomyopathy, hereditary hemochromatosis, and type 2 diabetes was referred by Dr. Agustin Cree for evaluation of CHF.  Patient is a compound heterozygote for [facility], followed by Dr. Benay Spice.  Last phlebotomy was in 11/20 and his ferritin level has been well-controlled for a long time. In 5/22, he began to develop significant exertional dyspnea.  He was admitted in 6/22, echo showed EF 30% with LV dilation and moderate LVH.  LHC showed no significant CAD.  Cardiac MRI showed EF 35% with extensive LGE in a mid-myocardial pattern in the basal septum and the basal to mid inferior wall. Findings on MRI were not consistent with hemochromatosis-related cardiomyopathy.  Patient was diuresed then started on GDMT for CHF.   Repeat echo in 9/22 showed EF still in the 30% range, patient had Medtronic BiV ICD placed.    He denies dyspnea on flat ground but says he walks slowly.  His daughter says he has cut back a lot on his activities since his admission with CHF.  He is not very active and does not exercise regularly.  He denies chest pain or lightheadedness.  He snores and sleeps a lot during the day.  Supposed to be on CPAP but does not have a functioning unit.  He is a prior smoker.    ECG: NSR, BiV paced  Labs (10/22): K 4.4, creatinine 1.3 Labs (11/22): hgb 16.5, ferritin normal  PMH: 1. 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  Last phlebotomy 11/20.  Ferritin well-controlled x years.  2. LBBB 3. Chronic systolic CHF: Nonischemic cardiomyopathy.  Medtronic CRT-D device.  - Echo (6/22): EF 30-35%, moderate LVH, normal RV.  - LHC (6/22): No significant CAD. - Cardiac MRI (6/22): Moderate LV dilation, EF 35% with regional WMAs, normal RV systolic function with EF 49%, mid-myocardial basal septal and basal-mid inferior LGE, T1 high (not suggestive of  cardiac involvement by hemochromatosis).  - Echo (9/22): EF 30%, moderate LV dilation, moderate LVH, normal RV.  4. Hyperlipidemia 5. Type 2 diabetes 6. Gout 7. HTN 8. OSA: Not using CPAP  SH: From [place], lives in [place] with daughter.  Has another daughter in town as well.  Prior smoker, quit years ago.  Rare ETOH.   HF: Thinks one of his brothers had heart disease, not sure what.   ROS: All systems reviewed and negative except as per HPI.   Current Outpatient Medications  Medication Sig Dispense Refill   allopurinol (ZYLOPRIM) 300 MG tablet TAKE 1 TABLET BY MOUTH  DAILY TO PREVENT GOUT 90 tablet 3   aspirin 81 MG tablet Take 81 mg by mouth daily.     atorvastatin (LIPITOR) 20 MG tablet TAKE 1 TABLET BY MOUTH  DAILY FOR CHOLESTEROL 90 tablet 3   bisoprolol (ZEBETA) 10 MG tablet Take 1 tablet (10 mg total) by mouth daily. 90 tablet 3   Cholecalciferol (VITAMIN D PO) Take 5,000 Units by mouth daily.     D-Mannose 500 MG CAPS Take 1,500 mg by mouth daily.     dapagliflozin propanediol (FARXIGA) 10 MG TABS tablet Take 1 tablet (10 mg total) by mouth daily. 90 tablet 3   famotidine (PEPCID) 40 MG tablet Take 40 mg by mouth daily.     furosemide (LASIX) 20 MG tablet Take 1 tablet (20 mg total) by mouth daily. TAKE 1 TABLET BY MOUTH  DAILY FOR BP, / FLUID  RETENTION / ANKLE SWELLING  90 tablet 3   glipiZIDE (GLUCOTROL) 10 MG tablet Take 1 tablet (10 mg total) by mouth daily before breakfast. 30 tablet 1   insulin isophane & regular human (HUMULIN 70/30 MIX) (70-30) 100 UNIT/ML KwikPen Inject into subcutaneous skin 54min before meals, 45units breakfast, 40units lunch and 22units dinner. 135 mL 4   Loratadine (CLARITIN PO) Take 1 tablet by mouth daily. Unknown strength     magnesium oxide (MAG-OX) 400 MG tablet Take 400 mg by mouth daily.     Melatonin 10 MG CAPS Take 10 mg by mouth at bedtime as needed (sleep).     metFORMIN (GLUCOPHAGE-XR) 500 MG 24 hr tablet TAKE 2 TABLETS BY MOUTH   TWICE DAILY WITH MEALS FOR  DIABETES 360 tablet 3   sacubitril-valsartan (ENTRESTO) 97-103 MG Take 1 tablet by mouth 2 (two) times daily. 60 tablet 4   spironolactone (ALDACTONE) 25 MG tablet Take 1 tablet (25 mg total) by mouth daily. 90 tablet 3   tamsulosin (FLOMAX) 0.4 MG CAPS capsule TAKE 1 CAPSULE BY MOUTH AT  BEDTIME FOR PROSTATE 90 capsule 3   No current facility-administered medications for this encounter.    There were no vitals taken for this visit. General: NAD Neck: JVP 7-8 cm, no thyromegaly or thyroid nodule.  Lungs: Clear to auscultation bilaterally with normal respiratory effort. CV: Nondisplaced PMI.  Heart regular S1/S2, no S3/S4, no murmur.  No peripheral edema.  No carotid bruit.  Normal pedal pulses.  Abdomen: Soft, nontender, no hepatosplenomegaly, mild distention.  Skin: Intact without lesions or rashes.  Neurologic: Alert and oriented x 3.  Psych: Normal affect. Extremities: No clubbing or cyanosis.  HEENT: Normal.   Assessment/Plan:  1. Chronic systolic CHF: Nonischemic cardiomyopathy.  MDT CRT-D device.  Cardiac MRI in 6/22 showed EF 35% with extensive LGE in a mid-myocardial pattern in the basal septum and the basal to mid inferior wall. Findings on MRI were not consistent with hemochromatosis-related cardiomyopathy. Most recent echo in 9/22 showed EF 30-35%, moderate LVH, normal RV.  Cath in 6/22 showed no significant CAD.  Cause of cardiomyopathy is uncertain.  There is extensive LGE as above with moderate LVH. Not classic pattern for hypertrophic cardiomyopathy but hard to rule out.  Cardiac sarcoidosis or amyloidosis would be considerations.  Prior viral myocarditis is also a definite possibility.  NYHA class 3, not volume overloaded on exam.  - Continue bisoprolol 10 mg daily.  - Continue dapafligliflozin 10 daily.  - Continue spironolactone 25 mg daily.  - Increase Entresto to 97/103 bid.  BMET today and again in 10 days.  - Needs more exercise, refer to  cardiac rehab.  - Repeat echo in 2 more months or so to assess for effect of BiV pacing.  - Would consider workup for cardiac sarcoidosis (Cardiac PET) and cardiac amyloidosis (myeloma labs and PYP scan), discuss at next appointment.  2. OSA: He has symptoms of OSA and carries diagnosis but does not have functional CPAP machine.  Refer to Fransico Him to get him back on CPAP.  3. HTN: BP controlled.  4. Hereditary hemochromatosis: Compound heterozygote.  Followed by hematology. No phlebotomy since 11/20.  Ferritin has been well-controlled.  Cardiac MRI did not look like cardiac hemochromatosis, would probably not expect with good disease control.   Followup in 2 months with echo.   Loralie Champagne 10/05/2021

## 2021-10-05 NOTE — Patient Instructions (Signed)
Medication Changes:  Increase Entresto to 97/103 Twice daily   Lab Work:  Labs done today, your results will be available in MyChart, we will contact you for abnormal readings.   Testing/Procedures:  Repeat labs in 10 days.  Your physician has requested that you have an echocardiogram. Echocardiography is a painless test that uses sound waves to create images of your heart. It provides your doctor with information about the size and shape of your heart and how well your hearts chambers and valves are working. This procedure takes approximately one hour. There are no restrictions for this procedure.   Referrals:  You have been referred to Dr. Radford Pax for CPAP. Her office will call you with an appointment.  You have been referred to Cardiac Rehab. They will contact you   Special Instructions // Education:   Follow-Up in: 2 months  At the [facility], you and your health needs are our priority. We have a designated team specialized in the treatment of Heart Failure. This Care Team includes your primary Heart Failure Specialized Cardiologist (physician), Advanced Practice Providers (APPs- Physician Assistants and Nurse Practitioners), and Pharmacist who all work together to provide you with the care you need, when you need it.   You may see any of the following providers on your designated Care Team at your next follow up:  Dr Glori Bickers Dr Haynes Kerns, NP Lyda Jester, [place] [facility] [place], [place] Audry Riles, PharmD   Please be sure to bring in all your medications bottles to every appointment.   Need to Contact us:  If you have any questions or concerns before your next appointment please send [place] a message through [place] or call our office at [phone].    TO LEAVE A MESSAGE FOR THE NURSE SELECT OPTION 2, PLEASE LEAVE A MESSAGE INCLUDING: YOUR NAME DATE OF BIRTH CALL BACK NUMBER REASON FOR CALL**this is  important as we prioritize the call backs  YOU WILL RECEIVE A CALL BACK THE SAME DAY AS LONG AS YOU CALL BEFORE 4:00 PM

## 2021-10-05 NOTE — Addendum Note (Signed)
Encounter addended by: Larey Dresser, MD on: 10/05/2021 11:13 PM  Actions taken: Clinical Note Signed

## 2021-10-11 ENCOUNTER — Encounter (HOSPITAL_COMMUNITY): Payer: Self-pay | Admitting: *Deleted

## 2021-10-11 NOTE — Progress Notes (Signed)
Received referral from Dr. Aundra Dubin for this pt to participate in Cardiac rehab with the diagnosis of Chronic Systolic heart failure.  Pt most recent echo from 06/2021 shows EF 30%.Clinical review of pt follow up appt on 12/13 with Dr. Aundra Dubin in the heart failure clinic.  Also reviewed progress note from Dr. Joycelyn Rua - cardiologist and Dr. Shonna Chock EP visit office not as well as his ICD placement on 11/21. Pt appropriate for scheduling for on site cardiac rehab and/or enrollment in Virtual Cardiac Rehab to begin 9 weeks post op from 09/13/21  Pt Covid Risk Score is 7.  Will forward to staff for follow up. Cherre Huger, BSN Cardiac and Training and development officer

## 2021-10-13 ENCOUNTER — Other Ambulatory Visit: Payer: Self-pay

## 2021-10-13 ENCOUNTER — Encounter (INDEPENDENT_AMBULATORY_CARE_PROVIDER_SITE_OTHER): Payer: Medicare Other | Admitting: Ophthalmology

## 2021-10-13 DIAGNOSIS — H35033 Hypertensive retinopathy, bilateral: Secondary | ICD-10-CM | POA: Diagnosis not present

## 2021-10-13 DIAGNOSIS — H35373 Puckering of macula, bilateral: Secondary | ICD-10-CM

## 2021-10-13 DIAGNOSIS — E113393 Type 2 diabetes mellitus with moderate nonproliferative diabetic retinopathy without macular edema, bilateral: Secondary | ICD-10-CM

## 2021-10-13 DIAGNOSIS — I10 Essential (primary) hypertension: Secondary | ICD-10-CM | POA: Diagnosis not present

## 2021-10-13 DIAGNOSIS — H43813 Vitreous degeneration, bilateral: Secondary | ICD-10-CM | POA: Diagnosis not present

## 2021-10-14 ENCOUNTER — Ambulatory Visit (HOSPITAL_COMMUNITY)
Admission: RE | Admit: 2021-10-14 | Discharge: 2021-10-14 | Disposition: A | Discharge status: Home / Self Care | Payer: Medicare Other | Source: Ambulatory Visit | Attending: Internal Medicine | Admitting: Internal Medicine

## 2021-10-14 DIAGNOSIS — I429 Cardiomyopathy, unspecified: Secondary | ICD-10-CM | POA: Diagnosis not present

## 2021-10-14 LAB — BASIC METABOLIC PANEL
Anion gap: 8 (ref 5–15)
BUN: 20 mg/dL (ref 8–23)
CO2: 25 mmol/L (ref 22–32)
Calcium: 8.9 mg/dL (ref 8.9–10.3)
Chloride: 104 mmol/L (ref 98–111)
Creatinine, Ser: 1.08 mg/dL (ref 0.61–1.24)
GFR, Estimated: >60 mL/min (ref >60)
Glucose, Bld: 125 mg/dL — ABNORMAL HIGH (ref 70–99)
Potassium: 4.3 mmol/L (ref 3.5–5.1)
Sodium: 137 mmol/L (ref 135–145)

## 2021-10-15 ENCOUNTER — Encounter (HOSPITAL_COMMUNITY): Payer: Self-pay

## 2021-10-21 ENCOUNTER — Other Ambulatory Visit: Payer: Self-pay

## 2021-11-08 ENCOUNTER — Other Ambulatory Visit: Payer: Self-pay | Admitting: Adult Health

## 2021-11-08 DIAGNOSIS — I10 Essential (primary) hypertension: Secondary | ICD-10-CM

## 2021-11-08 DIAGNOSIS — N401 Enlarged prostate with lower urinary tract symptoms: Secondary | ICD-10-CM

## 2021-11-08 DIAGNOSIS — N138 Other obstructive and reflux uropathy: Secondary | ICD-10-CM

## 2021-11-09 ENCOUNTER — Telehealth (HOSPITAL_COMMUNITY): Payer: Self-pay

## 2021-11-09 NOTE — Telephone Encounter (Signed)
No response from pt.  Closed referral  

## 2021-11-16 ENCOUNTER — Other Ambulatory Visit: Payer: Self-pay | Admitting: Adult Health

## 2021-11-16 DIAGNOSIS — I10 Essential (primary) hypertension: Secondary | ICD-10-CM

## 2021-11-19 ENCOUNTER — Other Ambulatory Visit: Payer: Self-pay

## 2021-11-19 ENCOUNTER — Other Ambulatory Visit: Payer: Self-pay | Admitting: Adult Health

## 2021-11-19 DIAGNOSIS — I10 Essential (primary) hypertension: Secondary | ICD-10-CM

## 2021-11-19 MED ORDER — SPIRONOLACTONE 25 MG PO TABS: 25.0000 mg | ORAL_TABLET | Freq: Every day | ORAL | 2 refills | Status: DC

## 2021-11-19 MED ORDER — BISOPROLOL FUMARATE 10 MG PO TABS: 10.0000 mg | ORAL_TABLET | Freq: Every day | ORAL | 2 refills | Status: DC

## 2021-11-19 NOTE — Telephone Encounter (Signed)
Refill of Spironolactone 25 mg and Bisoprolol 10 mg sent to [facility].

## 2021-11-25 ENCOUNTER — Other Ambulatory Visit (HOSPITAL_COMMUNITY): Payer: Self-pay

## 2021-11-25 DIAGNOSIS — I5020 Unspecified systolic (congestive) heart failure: Secondary | ICD-10-CM

## 2021-11-25 NOTE — Progress Notes (Signed)
Orders Placed This Encounter  Procedures   ECHOCARDIOGRAM COMPLETE    Standing Status:   Future    Standing Expiration Date:   11/25/2022    Order Specific Question:   Where should this test be performed    Answer:   [place]    Order Specific Question:   Perflutren DEFINITY (image enhancing agent) should be administered unless hypersensitivity or allergy exist    Answer:   Administer Perflutren    Order Specific Question:   Reason for exam-Echo    Answer:   Congestive Heart Failure  I50.9    Order Specific Question:   Release to patient    Answer:   Immediate

## 2021-12-11 ENCOUNTER — Other Ambulatory Visit: Payer: Self-pay | Admitting: Student

## 2021-12-11 ENCOUNTER — Other Ambulatory Visit: Payer: Self-pay | Admitting: Adult Health

## 2021-12-11 DIAGNOSIS — I10 Essential (primary) hypertension: Secondary | ICD-10-CM

## 2021-12-13 ENCOUNTER — Ambulatory Visit (INDEPENDENT_AMBULATORY_CARE_PROVIDER_SITE_OTHER): Payer: Medicare Other

## 2021-12-13 ENCOUNTER — Other Ambulatory Visit: Payer: Self-pay

## 2021-12-13 DIAGNOSIS — I428 Other cardiomyopathies: Secondary | ICD-10-CM

## 2021-12-13 LAB — CUP PACEART REMOTE DEVICE CHECK
Battery Remaining Longevity: 92 mo
Battery Voltage: 2.97 V
Brady Statistic AP VP Percent: 41.32 %
Brady Statistic AP VS Percent: 0.66 %
Brady Statistic AS VP Percent: 56.39 %
Brady Statistic AS VS Percent: 1.63 %
Brady Statistic RA Percent Paced: 38.72 %
Brady Statistic RV Percent Paced: 20.96 %
Date Time Interrogation Session: 20230220002209
HighPow Impedance: 62 Ohm
Implantable Lead Implant Date: 20221121
Implantable Lead Implant Date: 20221121
Implantable Lead Implant Date: 20221121
Implantable Lead Location: 753858
Implantable Lead Location: 753859
Implantable Lead Location: 753860
Implantable Lead Model: 4598
Implantable Lead Model: 5076
Implantable Pulse Generator Implant Date: 20221121
Lead Channel Impedance Value: 152 Ohm
Lead Channel Impedance Value: 156.606
Lead Channel Impedance Value: 172.541
Lead Channel Impedance Value: 172.541
Lead Channel Impedance Value: 178.5 Ohm
Lead Channel Impedance Value: 304 Ohm
Lead Channel Impedance Value: 304 Ohm
Lead Channel Impedance Value: 323 Ohm
Lead Channel Impedance Value: 342 Ohm
Lead Channel Impedance Value: 399 Ohm
Lead Channel Impedance Value: 399 Ohm
Lead Channel Impedance Value: 437 Ohm
Lead Channel Impedance Value: 437 Ohm
Lead Channel Impedance Value: 494 Ohm
Lead Channel Impedance Value: 494 Ohm
Lead Channel Impedance Value: 589 Ohm
Lead Channel Impedance Value: 627 Ohm
Lead Channel Impedance Value: 646 Ohm
Lead Channel Pacing Threshold Amplitude: 0.75 V
Lead Channel Pacing Threshold Amplitude: 0.875 V
Lead Channel Pacing Threshold Amplitude: 1 V
Lead Channel Pacing Threshold Pulse Width: 0.4 ms
Lead Channel Pacing Threshold Pulse Width: 0.4 ms
Lead Channel Pacing Threshold Pulse Width: 0.4 ms
Lead Channel Sensing Intrinsic Amplitude: 1.5 mV
Lead Channel Sensing Intrinsic Amplitude: 1.5 mV
Lead Channel Sensing Intrinsic Amplitude: 19.625 mV
Lead Channel Sensing Intrinsic Amplitude: 19.625 mV
Lead Channel Setting Pacing Amplitude: 2 V
Lead Channel Setting Pacing Amplitude: 3.5 V
Lead Channel Setting Pacing Amplitude: 3.5 V
Lead Channel Setting Pacing Pulse Width: 0.4 ms
Lead Channel Setting Pacing Pulse Width: 0.4 ms
Lead Channel Setting Sensing Sensitivity: 0.3 mV

## 2021-12-13 MED ORDER — ENTRESTO 97-103 MG PO TABS: 1.0000 | ORAL_TABLET | Freq: Two times a day (BID) | ORAL | 0 refills | Status: DC

## 2021-12-15 ENCOUNTER — Encounter (HOSPITAL_BASED_OUTPATIENT_CLINIC_OR_DEPARTMENT_OTHER): Payer: Self-pay | Admitting: Family Medicine

## 2021-12-15 ENCOUNTER — Other Ambulatory Visit: Payer: Self-pay

## 2021-12-15 ENCOUNTER — Ambulatory Visit (INDEPENDENT_AMBULATORY_CARE_PROVIDER_SITE_OTHER): Payer: Medicare Other | Admitting: Family Medicine

## 2021-12-15 VITALS — BP 112/70 | HR 71 | Ht 65.0 in | Wt 207.9 lb

## 2021-12-15 DIAGNOSIS — R32 Unspecified urinary incontinence: Secondary | ICD-10-CM

## 2021-12-15 DIAGNOSIS — E1122 Type 2 diabetes mellitus with diabetic chronic kidney disease: Secondary | ICD-10-CM | POA: Diagnosis not present

## 2021-12-15 DIAGNOSIS — R4189 Other symptoms and signs involving cognitive functions and awareness: Secondary | ICD-10-CM | POA: Diagnosis not present

## 2021-12-15 DIAGNOSIS — N182 Chronic kidney disease, stage 2 (mild): Secondary | ICD-10-CM

## 2021-12-15 DIAGNOSIS — R194 Change in bowel habit: Secondary | ICD-10-CM

## 2021-12-15 DIAGNOSIS — G4733 Obstructive sleep apnea (adult) (pediatric): Secondary | ICD-10-CM

## 2021-12-15 DIAGNOSIS — Z794 Long term (current) use of insulin: Secondary | ICD-10-CM

## 2021-12-15 NOTE — Progress Notes (Signed)
New Patient Office Visit  Subjective:  Patient ID: Brett [name], male    DOB: [birth year]  Age: 76 y.o. MRN: [id number]  CC:  Chief Complaint  Patient presents with   Establish Care    Prior PCP Notes in Epic   Memory Loss    Patient daughter is present with patient today and voices concerns of cognitive impairment. She states she has noticed a decline in patients memory and awareness since he had his cardiac event and had his ICD placed. She states at times he forgets how to get home, the layout of his home, how to do simple things.    Urinary Incontinence    Patients daughter states he is having trouble controlling his urine stream. He is also having issues controlling his bowels. She states when he has the urge he has to go immediately, has had several accidents.     HPI Brett [name] is a 76 yo male presenting to establish in clinic, accompanied by daughter today.  They have current concerns as outlined above.  Cognitive impairment: daughter notes that over the past 9-12 months that patient has been having declining cognitive functioning Will have some days where he is fine and other days where he is in a "fog" according to daughter Daughter was living in [place] and moved in with patient to help with caring for him Patient has had decline in ADLs at home - would be more active, has had decreased activity at home. Occasional speech issues, daughter reports slower speech, repetitive - repeating some conversations. Has had trouble finding his way around the house at times. Had an episode where he was driving and got lost coming home, had to have police called to assist. He has since stopped driving for the most part, now will only go to local grocery store if at all.  Incontinence: Urinary incontinence has been going on for some time (daughter thinks years), bowel irregularity has been more of an issue over the past few months Wears depends now Daughter reports that there is always a mess  in the bathroom after he goes Patient denies any pain with urination. No blood in the urine or stool  Dilated cardiomyopathy/CHF: does follow with cardiology. Current medications include bisoprolol, statin, Farxiga, furosemide, spironolactone.  Continues with close follow-up with cardiology.  Has upcoming appointment, will also be completing monitoring echocardiogram.  DM: Current medications include metformin, 70/30 insulin, Wilder Glade which was started related to his CHF.  Also has glipizide, reportedly takes 10 mg 3 times daily.  Patient and daughter deny any issues with low blood sugar readings.  Patient decays that he checks his blood sugar about 2-3 times daily.  Most recent hemoglobin A1c was several months ago and found to be 7.6%.  With prior PCP, appears they were targeting an A1c of less than 8.0% given his age, comorbidities.  Patient is originally from [place], has lived here for about 50 years. 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  Past Medical History:  Diagnosis Date   Acid reflux    Allergy    Colon polyp    Diabetes mellitus without complication ([place])    Dilated cardiomyopathy ([place])    non isch   Gout    Gout    Hemochromatosis    Hypertension    OSA (obstructive sleep apnea)    Urine frequency     Past Surgical History:  Procedure Laterality Date   BIV ICD INSERTION CRT-D N/A 09/13/2021   Procedure: BIV ICD INSERTION  CRT-D;  Surgeon: Constance Haw, MD;  Location: [place];  Service: Cardiovascular;  Laterality: N/A;   EYE SURGERY Bilateral    Cataract   RIGHT/LEFT HEART CATH AND CORONARY ANGIOGRAPHY N/A 03/29/2021   Procedure: RIGHT/LEFT HEART CATH AND CORONARY ANGIOGRAPHY;  Surgeon: Nigel Mormon, MD;  Location: [place] [facility];  Service: Cardiovascular;  Laterality: N/A;    Family History  Problem Relation Age of Onset   Diabetes Father    Diabetes Other    Hypertension Other    Hyperlipidemia Other    Heart disease Other    Cancer  Maternal Uncle        brain   Heart attack Brother        32   Diabetes Brother     Social History   Socioeconomic History   Marital status: Divorced    Spouse name: Not on file   Number of children: Not on file   Years of education: Not on file   Highest education level: Not on file  Occupational History   Not on file  Tobacco Use   Smoking status: Former    Packs/day: 3.00    Years: 19.00    Pack years: 57.00    Types: Cigarettes    Start date: 10/24/1957    Quit date: 11/24/1976    Years since quitting: 45.0   Smokeless tobacco: Never  Substance and Sexual Activity   Alcohol use: Yes    Comment: Occasionally   Drug use: No   Sexual activity: Not on file  Other Topics Concern   Not on file  Social History Narrative   Pt lives w/ ex wife   Social Determinants of Health   Financial Resource Strain: Not on file  Food Insecurity: Not on file  Transportation Needs: Not on file  Physical Activity: Not on file  Stress: Not on file  Social Connections: Not on file  Intimate Partner Violence: Not on file    Objective:   Today's Vitals: BP 112/70    Pulse 71    Ht 5\' 5"  (1.651 m)    Wt 207 lb 14.4 oz (94.3 kg)    SpO2 97%    BMI 34.60 kg/m   Physical Exam  76 year old male in no acute distress Cardiovascular exam with regular rate and rhythm Lungs clear to auscultation bilaterally  Assessment & Plan:   Problem List Items Addressed This Visit       Respiratory   OSA (obstructive sleep apnea)    Diagnosed with OSA in the past, does not have a functioning CPAP machine at present Will refer to pulmonology for evaluation, completion of sleep study and prescribing of CPAP as indicated      Relevant Orders   Ambulatory referral to Pulmonology     Endocrine   Type 2 diabetes mellitus with stage 2 chronic kidney disease, with long-term current use of insulin ([place]) - Primary    Last hemoglobin A1c was in June 2022 and was at goal for patient.  Given patient's  comorbidities, his particular A1c target is likely less than 8.0% Can continue with current medications including Wilder Glade which is being taken also for underlying CHF, 70/30 insulin, metformin, glipizide.  Review of medication indicates glipizide is being prescribed as 10 mg 3 times daily, likely will need to adjust this to 10 mg twice daily as there is likely to be less additional benefit from taking 30 mg of medication daily      Relevant  Orders   Hemoglobin A1c     Other   Cognitive impairment    Chronic issue for patient, part of the history provided by daughter as discussed above, has been worsening.  Appears to also coincide with cardiac issues Given ongoing problems with gradual worsening impact related to ADLs, daily functions, will refer for further cognitive evaluation and recommendations      Relevant Orders   Ambulatory referral to Neurology   Urinary incontinence    Chronic issue for patient Will refer to urology for further evaluation Did discuss with patient and daughter that part of the issue may be his medications, however issues have been going on since prior to new medications including Farxiga, Lasix, spironolactone      Relevant Orders   Ambulatory referral to Urology   Change in bowel habit    Relatively new issue for patient, has primarily had trouble with bowel urgency Will place referral to GI for further evaluation and recommendations      Relevant Orders   Ambulatory referral to Gastroenterology    Outpatient Encounter Medications as of 12/15/2021  Medication Sig   allopurinol (ZYLOPRIM) 300 MG tablet TAKE 1 TABLET BY MOUTH  DAILY TO PREVENT GOUT   aspirin 81 MG tablet Take 81 mg by mouth daily.   atorvastatin (LIPITOR) 20 MG tablet TAKE 1 TABLET BY MOUTH  DAILY FOR CHOLESTEROL   bisoprolol (ZEBETA) 10 MG tablet Take 1 tablet (10 mg total) by mouth daily.   Cholecalciferol (VITAMIN D PO) Take 5,000 Units by mouth daily.   D-Mannose 500 MG CAPS Take  1,500 mg by mouth daily.   famotidine (PEPCID) 40 MG tablet Take 40 mg by mouth daily.   FARXIGA 10 MG TABS tablet TAKE 1 TABLET BY MOUTH DAILY   furosemide (LASIX) 20 MG tablet Take 1 tablet (20 mg total) by mouth daily. TAKE 1 TABLET BY MOUTH  DAILY FOR BP, / FLUID  RETENTION / ANKLE SWELLING   glipiZIDE (GLUCOTROL) 10 MG tablet TAKE 1 TABLET BY MOUTH 3  TIMES DAILY WITH MEALS FOR  DIABETES   insulin isophane & regular human (HUMULIN 70/30 MIX) (70-30) 100 UNIT/ML KwikPen Inject into subcutaneous skin 72min before meals, 45units breakfast, 40units lunch and 22units dinner.   Loratadine (CLARITIN PO) Take 1 tablet by mouth daily. Unknown strength   magnesium oxide (MAG-OX) 400 MG tablet Take 400 mg by mouth daily.   Melatonin 10 MG CAPS Take 10 mg by mouth at bedtime as needed (sleep).   metFORMIN (GLUCOPHAGE-XR) 500 MG 24 hr tablet TAKE 2 TABLETS BY MOUTH  TWICE DAILY WITH MEALS FOR  DIABETES   sacubitril-valsartan (ENTRESTO) 97-103 MG Take 1 tablet by mouth 2 (two) times daily. Pt needs to keep upcoming appt in Feb and Mar for further refills   spironolactone (ALDACTONE) 25 MG tablet Take 1 tablet (25 mg total) by mouth daily.   tamsulosin (FLOMAX) 0.4 MG CAPS capsule TAKE 1 CAPSULE BY MOUTH AT  BEDTIME FOR PROSTATE   No facility-administered encounter medications on file as of 12/15/2021.    Follow-up: Return in about 8 weeks (around 02/09/2022) for Follow Up.   Zaylyn Bergdoll J [place], MD

## 2021-12-15 NOTE — Assessment & Plan Note (Signed)
Last hemoglobin A1c was in June 2022 and was at goal for patient.  Given patient's comorbidities, his particular A1c target is likely less than 8.0% Can continue with current medications including Wilder Glade which is being taken also for underlying CHF, 70/30 insulin, metformin, glipizide.  Review of medication indicates glipizide is being prescribed as 10 mg 3 times daily, likely will need to adjust this to 10 mg twice daily as there is likely to be less additional benefit from taking 30 mg of medication daily

## 2021-12-15 NOTE — Assessment & Plan Note (Signed)
Diagnosed with OSA in the past, does not have a functioning CPAP machine at present Will refer to pulmonology for evaluation, completion of sleep study and prescribing of CPAP as indicated

## 2021-12-15 NOTE — Assessment & Plan Note (Signed)
Chronic issue for patient Brett [name] refer to urology for further evaluation Did discuss with patient and daughter that part of the issue may be his medications, however issues have been going on since prior to new medications including Farxiga, Lasix, spironolactone

## 2021-12-15 NOTE — Patient Instructions (Addendum)
°  Medication Instructions:  Your physician recommends that you continue on your current medications as directed. Please refer to the Current Medication list given to you today. --If you need a refill on any your medications before your next appointment, please call your pharmacy first. If no refills are authorized on file call the office.-- Lab Work: Your physician has recommended that you have lab work today: A1C If you have labs (blood work) drawn today and your tests are completely normal, you will receive your results via [place] a phone call from our staff.  Please ensure you check your voicemail in the event that you authorized detailed messages to be left on a delegated number. If you have any lab test that is abnormal or we need to change your treatment, we will call you to review the results.  Referrals/Procedures/Imaging: A referral has been placed for you to Pulmonology, Urology, Neurology, and Gastroenterology for evaluation and treament  Follow-Up: Your next appointment:   Your physician recommends that you schedule a follow-up appointment in: 6-8 WEEKS with Dr. de [place]  You will receive a text message or e-mail with a link to a survey about your care and experience with [place] today! We would greatly appreciate your feedback!   Thanks for letting us be apart of your health journey!!  Primary Care and Sports Medicine   Dr. Arlina Robes [place]   We encourage you to activate your patient portal called "MyChart".  Sign up information is provided on this After Visit Summary.  MyChart is used to connect with patients for Virtual Visits (Telemedicine).  Patients are able to view lab/test results, encounter notes, upcoming appointments, etc.  Non-urgent messages can be sent to your provider as well. To learn more about what you can do with MyChart, please visit --  [email or web address].

## 2021-12-15 NOTE — Assessment & Plan Note (Signed)
Relatively new issue for patient, has primarily had trouble with bowel urgency Will place referral to GI for further evaluation and recommendations

## 2021-12-15 NOTE — Assessment & Plan Note (Signed)
Chronic issue for patient, part of the history provided by daughter as discussed above, has been worsening.  Appears to also coincide with cardiac issues Given ongoing problems with gradual worsening impact related to ADLs, daily functions, will refer for further cognitive evaluation and recommendations

## 2021-12-16 ENCOUNTER — Encounter: Payer: Self-pay | Admitting: Neurology

## 2021-12-16 LAB — HEMOGLOBIN A1C
Est. average glucose Bld gHb Est-mCnc: 160 mg/dL
Hgb A1c MFr Bld: 7.2 % — ABNORMAL HIGH (ref 4.8–5.6)

## 2021-12-17 ENCOUNTER — Other Ambulatory Visit: Payer: Self-pay

## 2021-12-17 ENCOUNTER — Encounter (HOSPITAL_COMMUNITY): Payer: Self-pay | Admitting: Cardiology

## 2021-12-17 ENCOUNTER — Ambulatory Visit (HOSPITAL_BASED_OUTPATIENT_CLINIC_OR_DEPARTMENT_OTHER)
Admission: RE | Admit: 2021-12-17 | Discharge: 2021-12-17 | Disposition: A | Discharge status: Home / Self Care | Payer: Medicare Other | Source: Ambulatory Visit | Attending: Cardiology | Admitting: Cardiology

## 2021-12-17 ENCOUNTER — Ambulatory Visit (HOSPITAL_COMMUNITY)
Admission: RE | Admit: 2021-12-17 | Discharge: 2021-12-17 | Disposition: A | Discharge status: Home / Self Care | Payer: Medicare Other | Source: Ambulatory Visit | Attending: Cardiology | Admitting: Cardiology

## 2021-12-17 VITALS — BP 104/60 | HR 60 | Wt 204.4 lb

## 2021-12-17 DIAGNOSIS — Z79899 Other long term (current) drug therapy: Secondary | ICD-10-CM | POA: Diagnosis not present

## 2021-12-17 DIAGNOSIS — Z95810 Presence of automatic (implantable) cardiac defibrillator: Secondary | ICD-10-CM | POA: Diagnosis not present

## 2021-12-17 DIAGNOSIS — I130 Hypertensive heart and chronic kidney disease with heart failure and stage 1 through stage 4 chronic kidney disease, or unspecified chronic kidney disease: Secondary | ICD-10-CM | POA: Diagnosis present

## 2021-12-17 DIAGNOSIS — E1122 Type 2 diabetes mellitus with diabetic chronic kidney disease: Secondary | ICD-10-CM | POA: Insufficient documentation

## 2021-12-17 DIAGNOSIS — Z7984 Long term (current) use of oral hypoglycemic drugs: Secondary | ICD-10-CM | POA: Insufficient documentation

## 2021-12-17 DIAGNOSIS — G4733 Obstructive sleep apnea (adult) (pediatric): Secondary | ICD-10-CM | POA: Insufficient documentation

## 2021-12-17 DIAGNOSIS — I5022 Chronic systolic (congestive) heart failure: Secondary | ICD-10-CM | POA: Diagnosis not present

## 2021-12-17 DIAGNOSIS — I5020 Unspecified systolic (congestive) heart failure: Secondary | ICD-10-CM

## 2021-12-17 DIAGNOSIS — I447 Left bundle-branch block, unspecified: Secondary | ICD-10-CM | POA: Insufficient documentation

## 2021-12-17 DIAGNOSIS — E785 Hyperlipidemia, unspecified: Secondary | ICD-10-CM | POA: Insufficient documentation

## 2021-12-17 DIAGNOSIS — I700 Atherosclerosis of aorta: Secondary | ICD-10-CM | POA: Diagnosis not present

## 2021-12-17 DIAGNOSIS — I358 Other nonrheumatic aortic valve disorders: Secondary | ICD-10-CM | POA: Insufficient documentation

## 2021-12-17 DIAGNOSIS — N189 Chronic kidney disease, unspecified: Secondary | ICD-10-CM | POA: Diagnosis not present

## 2021-12-17 DIAGNOSIS — I428 Other cardiomyopathies: Secondary | ICD-10-CM | POA: Insufficient documentation

## 2021-12-17 LAB — BASIC METABOLIC PANEL
Anion gap: 9 (ref 5–15)
BUN: 19 mg/dL (ref 8–23)
CO2: 27 mmol/L (ref 22–32)
Calcium: 9.5 mg/dL (ref 8.9–10.3)
Chloride: 103 mmol/L (ref 98–111)
Creatinine, Ser: 1.12 mg/dL (ref 0.61–1.24)
GFR, Estimated: >60 mL/min (ref >60)
Glucose, Bld: 206 mg/dL — ABNORMAL HIGH (ref 70–99)
Potassium: 4.5 mmol/L (ref 3.5–5.1)
Sodium: 139 mmol/L (ref 135–145)

## 2021-12-17 LAB — ECHOCARDIOGRAM COMPLETE
Area-P 1/2: 2.29 cm2
Calc EF: 37.9 %
S' Lateral: 5.2 cm
Single Plane A2C EF: 36.5 %
Single Plane A4C EF: 39.2 %

## 2021-12-17 LAB — BRAIN NATRIURETIC PEPTIDE: B Natriuretic Peptide: 139.1 pg/mL — ABNORMAL HIGH (ref 0.0–100.0)

## 2021-12-17 NOTE — Patient Instructions (Addendum)
Thank you for your visit today.  We have not changed any of your medications today.  Labs done today, your results will be available in MyChart, we will contact you for abnormal readings.  You have been referred to cardiac rehab. They will call you to arrange your appointment.  Your provider has ordered and sleep study for you. They will call you to schedule the appointment.  Your have been referred to Dr. Radford Pax for your CPAP.  They will call you to arrange your appointment.  Your physician recommends that you schedule a follow-up appointment in: 3 months.  If you have any questions or concerns before your next appointment please send [place] a message through [place] or call our office at [phone].    TO LEAVE A MESSAGE FOR THE NURSE SELECT OPTION 2, PLEASE LEAVE A MESSAGE INCLUDING: YOUR NAME DATE OF BIRTH CALL BACK NUMBER REASON FOR CALL**this is important as we prioritize the call backs  YOU WILL RECEIVE A CALL BACK THE SAME DAY AS LONG AS YOU CALL BEFORE 4:00 PM  At the [facility], you and your health needs are our priority. As part of our continuing mission to provide you with exceptional heart care, we have created designated Provider Care Teams. These Care Teams include your primary Cardiologist (physician) and Advanced Practice Providers (APPs- Physician Assistants and Nurse Practitioners) who all work together to provide you with the care you need, when you need it.   You may see any of the following providers on your designated Care Team at your next follow up: Dr Glori Bickers Dr Haynes Kerns, NP Lyda Jester, [place] [facility] [place], [place] Audry Riles, PharmD   Please be sure to bring in all your medications bottles to every appointment.

## 2021-12-17 NOTE — Progress Notes (Signed)
°  Echocardiogram 2D Echocardiogram has been performed.  Brett [name] 12/17/2021, 9:51 AM

## 2021-12-18 ENCOUNTER — Other Ambulatory Visit: Payer: Self-pay | Admitting: Adult Health

## 2021-12-18 DIAGNOSIS — I10 Essential (primary) hypertension: Secondary | ICD-10-CM

## 2021-12-18 NOTE — Progress Notes (Signed)
PCP: de [place], Raymond J, MD Cardiology: Dr. Agustin Cree HF Cardiology: Dr. Aundra Dubin  76 y.o. with history of chronic systolic CHF/nonischemic cardiomyopathy, hereditary hemochromatosis, and type 2 diabetes was referred by Dr. Agustin Cree for evaluation of CHF.  Patient is a compound heterozygote for [facility], followed by Dr. Benay Spice.  Last phlebotomy was in 11/20 and his ferritin level has been well-controlled for a long time. In 5/22, he began to develop significant exertional dyspnea.  He was admitted in 6/22, echo showed EF 30% with LV dilation and moderate LVH.  LHC showed no significant CAD.  Cardiac MRI showed EF 35% with extensive LGE in a mid-myocardial pattern in the basal septum and the basal to mid inferior wall. Findings on MRI were not consistent with hemochromatosis-related cardiomyopathy.  Patient was diuresed then started on GDMT for CHF.   Repeat echo in 9/22 showed EF still in the 30% range, patient had Medtronic BiV ICD placed.  Echo was done today and reviewed, EF 30-35%, moderate LV dilation, mild LVH, normal RV size and systolic function, IVC normal.   Stable symptomatically.  Not very active. No dyspnea walking in the office today, but does not get much exercise. No chest pain.  No orthopnea/PND. No lightheadedness.  Having some memory issues per his daughter.    MDT device interrogation: Stable thoracic impedance, no AF/VT, 99% BiV pacing  Labs (10/22): K 4.4, creatinine 1.3 Labs (11/22): hgb 16.5, ferritin normal Labs (12/22): K 4.3, creatinine 1.08, BNP 158  PMH: 1. 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  Last phlebotomy 11/20.  Ferritin well-controlled x years.  2. LBBB 3. Chronic systolic CHF: Nonischemic cardiomyopathy.  Medtronic CRT-D device.  - Echo (6/22): EF 30-35%, moderate LVH, normal RV.  - LHC (6/22): No significant CAD. - Cardiac MRI (6/22): Moderate LV dilation, EF 35% with regional WMAs, normal RV systolic function with EF 49%, mid-myocardial  basal septal and basal-mid inferior LGE, T1 high (not suggestive of cardiac involvement by hemochromatosis).  - Echo (9/22): EF 30%, moderate LV dilation, moderate LVH, normal RV.  - Echo (2/23): EF 30-35%, moderate LV dilation, mild LVH, normal RV size and systolic function, IVC normal.  4. Hyperlipidemia 5. Type 2 diabetes 6. Gout 7. HTN 8. OSA: Not using CPAP  SH: From [place], lives in [place] with daughter.  Has another daughter in town as well.  Prior smoker, quit years ago.  Rare ETOH.   HF: Thinks one of his brothers had heart disease, not sure what.   ROS: All systems reviewed and negative except as per HPI.   Current Outpatient Medications  Medication Sig Dispense Refill   allopurinol (ZYLOPRIM) 300 MG tablet TAKE 1 TABLET BY MOUTH  DAILY TO PREVENT GOUT 90 tablet 3   aspirin 81 MG tablet Take 81 mg by mouth daily.     atorvastatin (LIPITOR) 20 MG tablet TAKE 1 TABLET BY MOUTH  DAILY FOR CHOLESTEROL 90 tablet 3   bisoprolol (ZEBETA) 10 MG tablet Take 1 tablet (10 mg total) by mouth daily. 90 tablet 2   Cholecalciferol (VITAMIN D PO) Take 5,000 Units by mouth daily.     D-Mannose 500 MG CAPS Take 1,500 mg by mouth daily.     famotidine (PEPCID) 40 MG tablet Take 40 mg by mouth daily.     FARXIGA 10 MG TABS tablet TAKE 1 TABLET BY MOUTH DAILY 100 tablet 2   furosemide (LASIX) 20 MG tablet Take 1 tablet (20 mg total) by mouth daily. TAKE 1 TABLET BY MOUTH  DAILY FOR BP, / FLUID  RETENTION / ANKLE SWELLING 90 tablet 3   glipiZIDE (GLUCOTROL) 10 MG tablet TAKE 1 TABLET BY MOUTH 3  TIMES DAILY WITH MEALS FOR  DIABETES 270 tablet 3   insulin isophane & regular human (HUMULIN 70/30 MIX) (70-30) 100 UNIT/ML KwikPen Inject into subcutaneous skin 45min before meals, 45units breakfast, 40units lunch and 22units dinner. 135 mL 4   Loratadine (CLARITIN PO) Take 1 tablet by mouth daily. Unknown strength     magnesium oxide (MAG-OX) 400 MG tablet Take 400 mg by mouth daily.     Melatonin 10  MG CAPS Take 10 mg by mouth at bedtime as needed (sleep).     metFORMIN (GLUCOPHAGE-XR) 500 MG 24 hr tablet TAKE 2 TABLETS BY MOUTH  TWICE DAILY WITH MEALS FOR  DIABETES 360 tablet 3   sacubitril-valsartan (ENTRESTO) 97-103 MG Take 1 tablet by mouth 2 (two) times daily. Pt needs to keep upcoming appt in Feb and Mar for further refills 180 tablet 0   spironolactone (ALDACTONE) 25 MG tablet Take 1 tablet (25 mg total) by mouth daily. 90 tablet 2   tamsulosin (FLOMAX) 0.4 MG CAPS capsule TAKE 1 CAPSULE BY MOUTH AT  BEDTIME FOR PROSTATE 90 capsule 3   No current facility-administered medications for this encounter.    BP 104/60    Pulse 60    Wt 92.7 kg (204 lb 6.4 oz)    SpO2 98%    BMI 34.01 kg/m  General: NAD Neck: No JVD, no thyromegaly or thyroid nodule.  Lungs: Clear to auscultation bilaterally with normal respiratory effort. CV: Nondisplaced PMI.  Heart regular S1/S2, no S3/S4, no murmur.  Trace ankle edema.  No carotid bruit.  Normal pedal pulses.  Abdomen: Soft, nontender, no hepatosplenomegaly, no distention.  Skin: Intact without lesions or rashes.  Neurologic: Alert and oriented x 3.  Psych: Normal affect. Extremities: No clubbing or cyanosis.  HEENT: Normal.   Assessment/Plan:  1. Chronic systolic CHF: Nonischemic cardiomyopathy.  MDT CRT-D device.  Cardiac MRI in 6/22 showed EF 35% with extensive LGE in a mid-myocardial pattern in the basal septum and the basal to mid inferior wall. Findings on MRI were not consistent with hemochromatosis-related cardiomyopathy. Echo in 9/22 showed EF 30-35%, moderate LVH, normal RV.  Cath in 6/22 showed no significant CAD.  Cause of cardiomyopathy is uncertain.  There is extensive LGE as above with moderate LVH. Not classic pattern for hypertrophic cardiomyopathy but hard to rule out.  Cardiac sarcoidosis or amyloidosis would be considerations.  Prior viral myocarditis is also a definite possibility. Echo today was done and reviewed, EF 30-35%,  moderate LV dilation, mild LVH, normal RV size and systolic function, IVC normal.  NYHA class II-III, not volume overloaded on exam.  - Continue bisoprolol 10 mg daily.  - Continue dapafligliflozin 10 daily.  - Continue spironolactone 25 mg daily.  - Continue Entresto 97/103 bid.   - I am going to refer him for cardiac rehab, needs more activity.  - BMET/BNP today.  2. OSA: He has symptoms of OSA and carries diagnosis but does not have functional CPAP machine.  Refer back to Fransico Him to get him back on CPAP.  3. HTN: BP controlled.  4. Hereditary hemochromatosis: Compound heterozygote.  Followed by hematology. No phlebotomy since 11/20.  Ferritin has been well-controlled.  Cardiac MRI did not look like cardiac hemochromatosis, would probably not expect with good disease control.   Followup in 3 months with APP.  Caytlyn Evers  Aundra Dubin 12/18/2021

## 2021-12-20 ENCOUNTER — Encounter: Payer: Self-pay | Admitting: *Deleted

## 2021-12-20 NOTE — Progress Notes (Signed)
Remote ICD transmission.   

## 2021-12-21 ENCOUNTER — Encounter: Payer: Self-pay | Admitting: Cardiology

## 2021-12-21 ENCOUNTER — Ambulatory Visit (INDEPENDENT_AMBULATORY_CARE_PROVIDER_SITE_OTHER): Payer: Medicare Other | Admitting: Neurology

## 2021-12-21 ENCOUNTER — Other Ambulatory Visit (INDEPENDENT_AMBULATORY_CARE_PROVIDER_SITE_OTHER): Payer: Medicare Other

## 2021-12-21 ENCOUNTER — Ambulatory Visit (INDEPENDENT_AMBULATORY_CARE_PROVIDER_SITE_OTHER): Payer: Medicare Other | Admitting: Cardiology

## 2021-12-21 ENCOUNTER — Encounter: Payer: Self-pay | Admitting: Neurology

## 2021-12-21 ENCOUNTER — Other Ambulatory Visit: Payer: Self-pay

## 2021-12-21 VITALS — BP 135/79 | HR 69 | Ht 65.0 in | Wt 208.0 lb

## 2021-12-21 DIAGNOSIS — I429 Cardiomyopathy, unspecified: Secondary | ICD-10-CM

## 2021-12-21 DIAGNOSIS — F0390 Unspecified dementia without behavioral disturbance: Secondary | ICD-10-CM | POA: Diagnosis not present

## 2021-12-21 DIAGNOSIS — I428 Other cardiomyopathies: Secondary | ICD-10-CM

## 2021-12-21 LAB — VITAMIN B12: Vitamin B-12: 208 pg/mL — ABNORMAL LOW (ref 211–911)

## 2021-12-21 LAB — TSH: TSH: 2.48 u[IU]/mL (ref 0.35–5.50)

## 2021-12-21 NOTE — Progress Notes (Signed)
NEUROLOGY CONSULTATION NOTE  Brett [name] MRN: [id number] DOB: 1946-05-10  Referring provider: Dr. Raymond de [place] Primary care provider: Dr. Arlina Robes [place]  Reason for consult:  memory loss  Dear Dr Tennis Must [place]:  Thank you for your kind referral of Brett [name] for consultation of the above symptoms. Although his history is well known to you, please allow me to reiterate it for the purpose of our medical record. The patient was accompanied to the clinic by his daughter Brett [name] who also provides collateral information. Records and images were personally reviewed where available.   HISTORY OF PRESENT ILLNESS: This is a pleasant 76 year old right-handed man with a history of hypertension, hyperlipidemia, MD, gout, CHF, s/p PPM, presenting for evaluation of memory loss. He feels his memory is "so-so, not great but not that bad." His daughter Brett [name] is present to provide additional history. Brett [name] moved from [place] last August 2022 to help him after his cardiac issues last year. Since she moved in, she started noticing progressive memory changes. There is a lot of repetitive questions, same conversations. He gets the days confused and would not be able to keep up with his doctors appointments. He was previously managing his own medications and denies missing doses, Brett [name] started to help in August, but recently started putting his medications in a pillbox because he was getting confused with the bottles. He remembers to take them but she has to remind him sometimes. His last HbA1c was 7.2. He was managing finances without difficulties, she now writes the checks and he signs them. He does not cook. He got lost driving around 3-4 months ago, he was 30 minutes away from home but was driving around for 3 hours and the police pulled him over. Glucose level at that time was 66. He has been driving very little since then. He denies misplacing things but loses the computer mouse or would disable the camera  and Brett [name] has to figure out what he had pressed when he says it is not his fault. He is mostly on his laptop during the day watching "junk." He would refuse to go outside with family or walk around, giving his pacemaker/cardiac issues as a reason even if he has been cleared for exercise by his cardiologist. He is able to dress himself. He does not bathe regularly, only when he has a doctors appointment. There is no paranoia or hallucinations, but he gets easily irritated when Brett [name] tries to get him to do something. He reports sleep is good, but Brett [name] shakes her head. He fell a week ago, he got lost in the house to use the bathroom, and fell down 6 steps. He denies any headaches, dizziness, diplopia, dysarthria/dysphagia, neck/back pain. He has occasional left hand numbness and tingling. He has bowel and bladder incontinence and wears Depends, Brett [name] also feels this causes him to isolate himself. He reports mood is good. Brett [name] reports hearing loss, as well as decreased ability to smell and taste. He has mild hand tremors that do not affect daily activities. He is currently living with Brett [name] and her boyfriend. His other daughter also lives in [place]. There is no clear family history of dementia, no significant head injuries. He stopped drinking alcohol since his heart issues started.   Laboratory Data: Lab Results  Component Value Date   TSH 3.764 03/26/2021   Lab Results  Component Value Date   YJEHUDJS97 026 09/16/2020      PAST MEDICAL HISTORY: Past Medical History:  Diagnosis Date   Acid reflux    Allergy    Colon polyp    Diabetes mellitus without complication ([place])    Dilated cardiomyopathy ([place])    non isch   Gout    Gout    Hemochromatosis    Hypertension    OSA (obstructive sleep apnea)    Urine frequency     PAST SURGICAL HISTORY: Past Surgical History:  Procedure Laterality Date   BIV ICD INSERTION CRT-D N/A 09/13/2021   Procedure: BIV ICD INSERTION CRT-D;  Surgeon:  Constance Haw, MD;  Location: [facility];  Service: Cardiovascular;  Laterality: N/A;   EYE SURGERY Bilateral    Cataract   RIGHT/LEFT HEART CATH AND CORONARY ANGIOGRAPHY N/A 03/29/2021   Procedure: RIGHT/LEFT HEART CATH AND CORONARY ANGIOGRAPHY;  Surgeon: Nigel Mormon, MD;  Location: [facility];  Service: Cardiovascular;  Laterality: N/A;    MEDICATIONS: Current Outpatient Medications on File Prior to Visit  Medication Sig Dispense Refill   allopurinol (ZYLOPRIM) 300 MG tablet TAKE 1 TABLET BY MOUTH  DAILY TO PREVENT GOUT 90 tablet 3   aspirin 81 MG tablet Take 81 mg by mouth daily.     atorvastatin (LIPITOR) 20 MG tablet TAKE 1 TABLET BY MOUTH  DAILY FOR CHOLESTEROL 90 tablet 3   bisoprolol (ZEBETA) 10 MG tablet Take 1 tablet (10 mg total) by mouth daily. 90 tablet 2   Cholecalciferol (VITAMIN D PO) Take 5,000 Units by mouth daily.     D-Mannose 500 MG CAPS Take 1,500 mg by mouth daily.     famotidine (PEPCID) 40 MG tablet Take 40 mg by mouth as needed.     FARXIGA 10 MG TABS tablet TAKE 1 TABLET BY MOUTH DAILY 100 tablet 2   furosemide (LASIX) 20 MG tablet Take 1 tablet (20 mg total) by mouth daily. TAKE 1 TABLET BY MOUTH  DAILY FOR BP, / FLUID  RETENTION / ANKLE SWELLING 90 tablet 3   glipiZIDE (GLUCOTROL) 10 MG tablet TAKE 1 TABLET BY MOUTH 3  TIMES DAILY WITH MEALS FOR  DIABETES 270 tablet 3   insulin isophane & regular human (HUMULIN 70/30 MIX) (70-30) 100 UNIT/ML KwikPen Inject into subcutaneous skin 31min before meals, 45units breakfast, 40units lunch and 22units dinner. 135 mL 4   Loratadine (CLARITIN PO) Take 1 tablet by mouth daily. Unknown strength     magnesium oxide (MAG-OX) 400 MG tablet Take 400 mg by mouth daily.     Melatonin 10 MG CAPS Take 10 mg by mouth at bedtime as needed (sleep).     metFORMIN (GLUCOPHAGE-XR) 500 MG 24 hr tablet TAKE 2 TABLETS BY MOUTH  TWICE DAILY WITH MEALS FOR  DIABETES 360 tablet 3   sacubitril-valsartan (ENTRESTO)  97-103 MG Take 1 tablet by mouth 2 (two) times daily. Pt needs to keep upcoming appt in Feb and Mar for further refills 180 tablet 0   spironolactone (ALDACTONE) 25 MG tablet Take 1 tablet (25 mg total) by mouth daily. 90 tablet 2   tamsulosin (FLOMAX) 0.4 MG CAPS capsule TAKE 1 CAPSULE BY MOUTH AT  BEDTIME FOR PROSTATE 90 capsule 3   No current facility-administered medications on file prior to visit.    ALLERGIES: No Known Allergies  FAMILY HISTORY: Family History  Problem Relation Age of Onset   Diabetes Father    Diabetes Other    Hypertension Other    Hyperlipidemia Other    Heart disease Other    Cancer Maternal Uncle  brain   Heart attack Brother        84   Diabetes Brother     SOCIAL HISTORY: Social History   Socioeconomic History   Marital status: Divorced    Spouse name: Not on file   Number of children: Not on file   Years of education: Not on file   Highest education level: Not on file  Occupational History   Not on file  Tobacco Use   Smoking status: Former    Packs/day: 3.00    Years: 19.00    Pack years: 57.00    Types: Cigarettes    Start date: 10/24/1957    Quit date: 11/24/1976    Years since quitting: 45.1   Smokeless tobacco: Never  Vaping Use   Vaping Use: Never used  Substance and Sexual Activity   Alcohol use: Not Currently    Comment: Occasionally   Drug use: No   Sexual activity: Not on file  Other Topics Concern   Not on file  Social History Narrative   Pt lives w/ ex wife   Right handed    Social Determinants of Health   Financial Resource Strain: Not on file  Food Insecurity: Not on file  Transportation Needs: Not on file  Physical Activity: Not on file  Stress: Not on file  Social Connections: Not on file  Intimate Partner Violence: Not on file     PHYSICAL EXAM: Vitals:   12/21/21 0854  BP: 135/79  Pulse: 69  SpO2: 97%   General: No acute distress Head:  Normocephalic/atraumatic Skin/Extremities: No rash,  no edema Neurological Exam: Mental status: alert and oriented to person, place, and time, no dysarthria or aphasia, Fund of knowledge is appropriate.  Recent and remote memory are impaired. Attention and concentration are reduced. Able to name objects, difficulty with repetition. He had difficulty with visuospatial/executive function tasks. MOCA 12/30 Cranial nerves: CN I: not tested CN II: pupils equal, round and reactive to light, visual fields intact CN III, IV, VI:  full range of motion, no nystagmus, no ptosis CN V: reports decreased pin on bilateral V1, intact on V2 CN VII: upper and lower face symmetric CN VIII: hearing intact to conversation CN IX, X: gag intact, uvula midline CN XI: sternocleidomastoid and trapezius muscles intact CN XII: tongue midline Bulk & Tone: normal, no fasciculations, no cogwheeling Motor: 5/5 throughout with no pronator drift, limited right shoulder abduction Sensation: intact to light touch, cold, pin, vibration sense.  No extinction to double simultaneous stimulation.  Romberg test negative Deep Tendon Reflexes: +1 throughout Cerebellar: no incoordination on finger to nose testing Gait: slow and cautious, no ataxia, good arm swing Tremor: no resting tremor, mild low amplitude high frequency bilateral postural tremor, no endpoint tremor   IMPRESSION: This is a pleasant 76 year old right-handed man with a history of hypertension, hyperlipidemia, MD, gout, CHF, s/p PPM, presenting for evaluation of memory loss. 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 MRI brain without contrast will be ordered to assess for underlying structural abnormality and assess vascular load. Check TSH and B12. We discussed medications for dementia, including expectations and he feels he is on too much medications. They will talk about it and let us know if he would like to start medication, would start Memantine  if he chooses so. We discussed the importance of control of vascular risk factors, physical exercise, brain stimulation exercises, and MIND diet for overall brain  health. It appears there is a component of depression as well, discuss SSRI with PCP. Continue close supervision. Follow-up with Memory Disorders PA Sharene Butters in 6 months, call for any changes.   Thank you for allowing me to participate in the care of this patient. Please do not hesitate to call for any questions or concerns.   Ellouise Newer, M.D.  CC: Dr. De [place]

## 2021-12-21 NOTE — Patient Instructions (Signed)
Good to meet you.  Schedule MRI brain without contrast  2. Bloodwork for TSH, B12  3. If you would like to consider starting a medication to help slow down progression of memory loss, please call our office  4. There are some activities which have therapeutic value and can be useful in keeping you cognitively stimulated. You can try this website: https://www.[facility].org/get-to-know-barrow/centers-programs/neurorehabilitation-center/neuro-rehab-apps-and-games/ which has options, categorized by level of difficulty.  5. Follow-up in 6 months, call for any changes   FALL PRECAUTIONS: Be cautious when walking. Scan the area for obstacles that may increase the risk of trips and falls. When getting up in the mornings, sit up at the edge of the bed for a few minutes before getting out of bed. Consider elevating the bed at the head end to avoid drop of blood pressure when getting up. Walk always in a well-lit room (use night lights in the walls). Avoid area rugs or power cords from appliances in the middle of the walkways. Use a walker or a cane if necessary and consider physical therapy for balance exercise. Get your eyesight checked regularly.  FINANCIAL OVERSIGHT: Supervision, especially oversight when making financial decisions or transactions is also recommended.  HOME SAFETY: Consider the safety of the kitchen when operating appliances like stoves, microwave oven, and blender. Consider having supervision and share cooking responsibilities until no longer able to participate in those. Accidents with firearms and other hazards in the house should be identified and addressed as well.  DRIVING: Regarding driving, in patients with progressive memory problems, driving will be impaired. We advise to have someone else do the driving if trouble finding directions or if minor accidents are reported. Independent driving assessment is available to determine safety of driving.  ABILITY TO BE LEFT ALONE: If  patient is unable to contact 911 operator, consider using LifeLine, or when the need is there, arrange for someone to stay with patients. Smoking is a fire hazard, consider supervision or cessation. Risk of wandering should be assessed by caregiver and if detected at any point, supervision and safe proof recommendations should be instituted.  MEDICATION SUPERVISION: Inability to self-administer medication needs to be constantly addressed. Implement a mechanism to ensure safe administration of the medications.  RECOMMENDATIONS FOR ALL PATIENTS WITH MEMORY PROBLEMS: 1. Continue to exercise (Recommend 30 minutes of walking everyday, or 3 hours every week) 2. Increase social interactions - continue going to [place] and enjoy social gatherings with friends and family 3. Eat healthy, avoid fried foods and eat more fruits and vegetables 4. Maintain adequate blood pressure, blood sugar, and blood cholesterol level. Reducing the risk of stroke and cardiovascular disease also helps promoting better memory. 5. Avoid stressful situations. Live a simple life and avoid aggravations. Organize your time and prepare for the next day in anticipation. 6. Sleep well, avoid any interruptions of sleep and avoid any distractions in the bedroom that may interfere with adequate sleep quality 7. Avoid sugar, avoid sweets as there is a strong link between excessive sugar intake, diabetes, and cognitive impairment We discussed the Mediterranean diet, which has been shown to help patients reduce the risk of progressive memory disorders and reduces cardiovascular risk. This includes eating fish, eat fruits and green leafy vegetables, nuts like almonds and hazelnuts, walnuts, and also use olive oil. Avoid fast foods and fried foods as much as possible. Avoid sweets and sugar as sugar use has been linked to worsening of memory function.  There is always a concern of gradual progression of memory  problems. If this is the case, then we  may need to adjust level of care according to patient needs. Support, both to the patient and caregiver, should then be put into place.       Mediterranean Diet  Why follow it? Research shows Those who follow the Mediterranean diet have a reduced risk of heart disease  The diet is associated with a reduced incidence of Parkinson's and Alzheimer's diseases People following the diet may have longer life expectancies and lower rates of chronic diseases  The Dietary Guidelines for Americans recommends the Mediterranean diet as an eating plan to promote health and prevent disease  What Is the Mediterranean Diet?  Healthy eating plan based on typical foods and recipes of Mediterranean-style cooking The diet is primarily a plant based diet; these foods should make up a majority of meals   Starches - Plant based foods should make up a majority of meals - They are an important sources of vitamins, minerals, energy, antioxidants, and fiber - Choose whole grains, foods high in fiber and minimally processed items  - Typical grain sources include wheat, oats, barley, corn, brown rice, bulgar, farro, millet, polenta, couscous  - Various types of beans include chickpeas, lentils, fava beans, black beans, white beans   Fruits  Veggies - Large quantities of antioxidant rich fruits & veggies; 6 or more servings  - Vegetables can be eaten raw or lightly drizzled with oil and cooked  - Vegetables common to the traditional Mediterranean Diet include: artichokes, arugula, beets, broccoli, brussel sprouts, cabbage, carrots, celery, collard greens, cucumbers, eggplant, kale, leeks, lemons, lettuce, mushrooms, okra, onions, peas, peppers, potatoes, pumpkin, radishes, rutabaga, shallots, spinach, sweet potatoes, turnips, zucchini - Fruits common to the Mediterranean Diet include: apples, apricots, avocados, cherries, clementines, dates, figs, grapefruits, grapes, melons, nectarines, oranges, peaches, pears,  pomegranates, strawberries, tangerines  Fats - Replace butter and margarine with healthy oils, such as olive oil, canola oil, and tahini  - Limit nuts to no more than a handful a day  - Nuts include walnuts, almonds, pecans, pistachios, pine nuts  - Limit or avoid candied, honey roasted or heavily salted nuts - Olives are central to the [employer] - can be eaten whole or used in a variety of dishes   Meats Protein - Limiting red meat: no more than a few times a month - When eating red meat: choose lean cuts and keep the portion to the size of deck of cards - Eggs: approx. 0 to 4 times a week  - Fish and lean poultry: at least 2 a week  - Healthy protein sources include, chicken, [place], lean beef, lamb - Increase intake of seafood such as tuna, salmon, trout, mackerel, shrimp, scallops - Avoid or limit high fat processed meats such as sausage and bacon  Dairy - Include moderate amounts of low fat dairy products  - Focus on healthy dairy such as fat free yogurt, skim milk, low or reduced fat cheese - Limit dairy products higher in fat such as whole or 2% milk, cheese, ice cream  Alcohol - Moderate amounts of red wine is ok  - No more than 5 oz daily for women (all ages) and men older than age 49  - No more than 10 oz of wine daily for men younger than 49  Other - Limit sweets and other desserts  - Use herbs and spices instead of salt to flavor foods  - Herbs and spices common to the traditional Mediterranean Diet include:  basil, bay leaves, chives, cloves, cumin, fennel, garlic, lavender, marjoram, mint, oregano, parsley, pepper, rosemary, sage, savory, sumac, tarragon, thyme   Its not just a diet, its a lifestyle:  The Mediterranean diet includes lifestyle factors typical of those in the region  Foods, drinks and meals are best eaten with others and savored Daily physical activity is important for overall good health This could be strenuous exercise like running and  aerobics This could also be more leisurely activities such as walking, housework, yard-work, or taking the stairs Moderation is the key; a balanced and healthy diet accommodates most foods and drinks Consider portion sizes and frequency of consumption of certain foods   Meal Ideas & Options:  Breakfast:  Whole wheat toast or whole wheat English muffins with peanut butter & hard boiled egg Steel cut oats topped with apples & cinnamon and skim milk  Fresh fruit: banana, strawberries, melon, berries, peaches  Smoothies: strawberries, bananas, greek yogurt, peanut butter Low fat greek yogurt with blueberries and granola  Egg white omelet with spinach and mushrooms Breakfast couscous: whole wheat couscous, apricots, skim milk, cranberries  Sandwiches:  Hummus and grilled vegetables (peppers, zucchini, squash) on whole wheat bread   Grilled chicken on whole wheat pita with lettuce, tomatoes, cucumbers or tzatziki  [place] salad on whole wheat bread: tuna salad made with greek yogurt, olives, red peppers, capers, green onions Garlic rosemary lamb pita: lamb sauted with garlic, rosemary, salt & pepper; add lettuce, cucumber, greek yogurt to pita - flavor with lemon juice and black pepper  Seafood:  Mediterranean grilled salmon, seasoned with garlic, basil, parsley, lemon juice and black pepper Shrimp, lemon, and spinach whole-grain pasta salad made with low fat greek yogurt  Seared scallops with lemon orzo  Seared tuna steaks seasoned salt, pepper, coriander topped with tomato mixture of olives, tomatoes, olive oil, minced garlic, parsley, green onions and cappers  Meats:  Herbed greek chicken salad with kalamata olives, cucumber, feta  Red bell peppers stuffed with spinach, bulgur, lean ground beef (or lentils) & topped with feta   Kebabs: skewers of chicken, tomatoes, onions, zucchini, squash  [place] burgers: made with red onions, mint, dill, lemon juice, feta cheese topped with roasted red  peppers Vegetarian Cucumber salad: cucumbers, artichoke hearts, celery, red onion, feta cheese, tossed in olive oil & lemon juice  Hummus and whole grain pita points with a greek salad (lettuce, tomato, feta, olives, cucumbers, red onion) Lentil soup with celery, carrots made with vegetable broth, garlic, salt and pepper  Tabouli salad: parsley, bulgur, mint, scallions, cucumbers, tomato, radishes, lemon juice, olive oil, salt and pepper.

## 2021-12-21 NOTE — Patient Instructions (Signed)
Medication Instructions:  Your physician recommends that you continue on your current medications as directed. Please refer to the Current Medication list given to you today.  *If you need a refill on your cardiac medications before your next appointment, please call your pharmacy*   Lab Work: None ordered   Testing/Procedures: None ordered   Follow-Up: At [facility], you and your health needs are our priority.  As part of our continuing mission to provide you with exceptional heart care, we have created designated Provider Care Teams.  These Care Teams include your primary Cardiologist (physician) and Advanced Practice Providers (APPs -  Physician Assistants and Nurse Practitioners) who all work together to provide you with the care you need, when you need it.  Remote monitoring is used to monitor your Pacemaker or ICD from home. This monitoring reduces the number of office visits required to check your device to one time per year. It allows [place] to keep an eye on the functioning of your device to ensure it is working properly. You are scheduled for a device check from home on 03/14/2022. You may send your transmission at any time that day. If you have a wireless device, the transmission will be sent automatically. After your physician reviews your transmission, you will receive a postcard with your next transmission date.  Your next appointment:   9 month(s)  The format for your next appointment:   In Person  Provider:   Allegra Lai, MD   Thank you for choosing [facility]!!   Trinidad Curet, RN [phone]    Other Instructions

## 2021-12-21 NOTE — Progress Notes (Signed)
We,   Electrophysiology Office Note   Date:  12/21/2021   ID:  Lan, Mcneill 14-Apr-1946, MRN [id number]  PCP:  [place], Blondell Reveal, MD  Cardiologist:  Agustin Cree Primary Electrophysiologist:  Brice Kossman Meredith Leeds, MD    Chief Complaint: CHF   History of Present Illness: Brett WITTMEYER is a 76 y.o. male who is being seen today for the evaluation of CHF at the request of No ref. provider found. Presenting today for electrophysiology evaluation.  Has a history significant for diabetes, dilated cardiomyopathy, hypertension, hemochromatosis.  He had a cardiac catheterization that showed no obstructive coronary artery disease.  He had a cardiac MRI that did not show hemochromatosis.  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  Today, denies symptoms of palpitations, chest pain, shortness of breath, orthopnea, PND, lower extremity edema, claudication, dizziness, presyncope, syncope, bleeding, or neurologic sequela. The patient is tolerating medications without difficulties. He is feeling well without issues. No chest pain. Less SOB but still weak. Has plans for cardiac rehab.    Past Medical History:  Diagnosis Date   Acid reflux    Allergy    Colon polyp    Diabetes mellitus without complication ([place])    Dilated cardiomyopathy ([place])    non isch   Gout    Gout    Hemochromatosis    Hypertension    OSA (obstructive sleep apnea)    Urine frequency    Past Surgical History:  Procedure Laterality Date   BIV ICD INSERTION CRT-D N/A 09/13/2021   Procedure: BIV ICD INSERTION CRT-D;  Surgeon: Constance Haw, MD;  Location: [facility];  Service: Cardiovascular;  Laterality: N/A;   EYE SURGERY Bilateral    Cataract   RIGHT/LEFT HEART CATH AND CORONARY ANGIOGRAPHY N/A 03/29/2021   Procedure: RIGHT/LEFT HEART CATH AND CORONARY ANGIOGRAPHY;  Surgeon: Nigel Mormon, MD;  Location: [facility];  Service: Cardiovascular;  Laterality: N/A;     Current  Outpatient Medications  Medication Sig Dispense Refill   allopurinol (ZYLOPRIM) 300 MG tablet TAKE 1 TABLET BY MOUTH  DAILY TO PREVENT GOUT 90 tablet 3   aspirin 81 MG tablet Take 81 mg by mouth daily.     atorvastatin (LIPITOR) 20 MG tablet TAKE 1 TABLET BY MOUTH  DAILY FOR CHOLESTEROL 90 tablet 3   bisoprolol (ZEBETA) 10 MG tablet Take 1 tablet (10 mg total) by mouth daily. 90 tablet 2   Cholecalciferol (VITAMIN D PO) Take 5,000 Units by mouth daily.     D-Mannose 500 MG CAPS Take 1,500 mg by mouth daily.     famotidine (PEPCID) 40 MG tablet Take 40 mg by mouth as needed.     FARXIGA 10 MG TABS tablet TAKE 1 TABLET BY MOUTH DAILY 100 tablet 2   furosemide (LASIX) 20 MG tablet Take 1 tablet (20 mg total) by mouth daily. TAKE 1 TABLET BY MOUTH  DAILY FOR BP, / FLUID  RETENTION / ANKLE SWELLING 90 tablet 3   glipiZIDE (GLUCOTROL) 10 MG tablet TAKE 1 TABLET BY MOUTH 3  TIMES DAILY WITH MEALS FOR  DIABETES 270 tablet 3   insulin isophane & regular human (HUMULIN 70/30 MIX) (70-30) 100 UNIT/ML KwikPen Inject into subcutaneous skin 13min before meals, 45units breakfast, 40units lunch and 22units dinner. 135 mL 4   Loratadine (CLARITIN PO) Take 1 tablet by mouth daily. Unknown strength     magnesium oxide (MAG-OX) 400 MG tablet Take 400 mg by mouth daily.  Melatonin 10 MG CAPS Take 10 mg by mouth at bedtime as needed (sleep).     metFORMIN (GLUCOPHAGE-XR) 500 MG 24 hr tablet TAKE 2 TABLETS BY MOUTH  TWICE DAILY WITH MEALS FOR  DIABETES 360 tablet 3   sacubitril-valsartan (ENTRESTO) 97-103 MG Take 1 tablet by mouth 2 (two) times daily. Pt needs to keep upcoming appt in Feb and Mar for further refills 180 tablet 0   spironolactone (ALDACTONE) 25 MG tablet Take 1 tablet (25 mg total) by mouth daily. 90 tablet 2   tamsulosin (FLOMAX) 0.4 MG CAPS capsule TAKE 1 CAPSULE BY MOUTH AT  BEDTIME FOR PROSTATE 90 capsule 3   No current facility-administered medications for this visit.    Allergies:   Patient  has no known allergies.   Social History:  The patient  reports that he quit smoking about 45 years ago. His smoking use included cigarettes. He started smoking about 64 years ago. He has a 57.00 pack-year smoking history. He has been exposed to tobacco smoke. He has never used smokeless tobacco. He reports that he does not currently use alcohol. He reports that he does not use drugs.   Family History:  The patient's family history includes Cancer in his maternal uncle; Diabetes in his brother, father, and another family member; Heart attack in his brother; Heart disease in an other family member; Hyperlipidemia in an other family member; Hypertension in an other family member.    ROS:  Please see the history of present illness.   Otherwise, review of systems is positive for none.   All other systems are reviewed and negative.   PHYSICAL EXAM: VS:  BP (!) 144/76    Pulse 68    Ht 5\' 5"  (1.651 m)    Wt 207 lb 9.6 oz (94.2 kg)    SpO2 94%    BMI 34.55 kg/m  , BMI Body mass index is 34.55 kg/m. GEN: Well nourished, well developed, in no acute distress  HEENT: normal  Neck: no JVD, carotid bruits, or masses Cardiac: RRR; no murmurs, rubs, or gallops,no edema  Respiratory:  clear to auscultation bilaterally, normal work of breathing GI: soft, nontender, nondistended, + BS MS: no deformity or atrophy  Skin: warm and dry, device site well healed Neuro:  Strength and sensation are intact Psych: euthymic mood, full affect  EKG:  EKG is ordered today. Personal review of the ekg ordered shows sinus rhythm, V pacing  Personal review of the device interrogation today. Results in [place]: 03/25/2021: ALT 28 05/10/2021: Magnesium 2.4 09/10/2021: Hemoglobin 16.5; Platelet Count 192 12/17/2021: B Natriuretic Peptide 139.1; BUN 19; Creatinine, Ser 1.12; Potassium 4.5; Sodium 139 12/21/2021: TSH 2.48    Lipid Panel     Component Value Date/Time   CHOL 111 03/26/2021 0326   TRIG 42  03/26/2021 0326   HDL 39 (L) 03/26/2021 0326   CHOLHDL 2.8 03/26/2021 0326   VLDL 8 03/26/2021 0326   LDLCALC 64 03/26/2021 0326   LDLCALC 56 07/06/2020 1429     Wt Readings from Last 3 Encounters:  12/21/21 207 lb 9.6 oz (94.2 kg)  12/21/21 208 lb (94.3 kg)  12/17/21 204 lb 6.4 oz (92.7 kg)      Other studies Reviewed: Additional studies/ records that were reviewed today include: TTE 06/25/21  Review of the above records today demonstrates:  1. Left ventricular ejection fraction, by estimation, is 30%. The left  ventricle has moderately decreased function. The left ventricle has no  regional wall motion abnormalities. The left ventricular internal cavity  size was moderately to severely  dilated. There is moderate concentric left ventricular hypertrophy. Left  ventricular diastolic parameters are consistent with Grade I diastolic  dysfunction (impaired relaxation).   2. Right ventricular systolic function is normal. The right ventricular  size is normal.   3. The mitral valve is normal in structure. No evidence of mitral valve  regurgitation. No evidence of mitral stenosis.   4. The aortic valve is tricuspid. Aortic valve regurgitation is not  visualized. No aortic stenosis is present.   5. The inferior vena cava is normal in size with greater than 50%  respiratory variability, suggesting right atrial pressure of 3 mmHg.   LHC/RHC 03/29/21 LM: Normal LAD: Minimal luminal irregularities. Mild calcification. LCx: Normal Ramus: Minimal luminal irregularities.  RCA: Minimal luminal irregularities. Mild calcification. Normal filling pressures  CMRI 03/31/21 1. Moderately dilated left ventricular size, with LVEDD 63 mm, but LVEDVi 125 mL/m2. Normal left ventricular thickness, with intraventricular septal thickness of 10 mm, posterior wall thickness of 10 mm and myocardial mass index of 77 g/m2. Moderate left ventricular systolic dysfunction (LVEF =35%). There are regional wall  motion abnormalities: Mid anteroseptal, inferoseptal, and inferior, and apical inferior and septal hypokinesis Left ventricular parametric mapping notable for increase in Native T1 signal (1200 ms greatest in the basal inferior and inferoseptal); increase in T2 in mid inferior (57 ms) and basal anterolateral (60 ms). There is late gadolinium enhancement in the left ventricular myocardium: Mid myocardial enhancement in the anteroseptal and inferoseptal base that extends into the basal to mid inferior. 2. Normal right ventricular size with RVEDVI 74 mL/m2 Normal right ventricular thickness. Normal right ventricular systolic function (RVEF =16%). There are no regional wall motion abnormalities or aneurysms. 3.  Normal left and right atrial size. 4. Normal size of the aortic root, ascending aorta and pulmonary artery. 5.  No significant valvular abnormalities 6.  Normal pericardium.  No pericardial effusion. 7. Grossly, no extracardiac findings. Recommended dedicated study if concerned for non-cardiac pathology. 8.  Significant breath hold artifact.  ASSESSMENT AND PLAN:  1.  Chronic systolic heart failure due to nonischemic cardiomyopathy: Currently on optimal medical therapy with bisoprolol 10 mg Aldactone 25 mg daily, Farxiga 10 mg daily, Entresto 49/51 mg twice daily.  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  There is a discrepancy between ventricular pacing and effective biventricular pacing.  We Dyamond Tolosa discuss this further with Medtronic as to optimization of his device.  2.  Hypertension: elevated today but usually well controlled. No changes.  3.  Hemochromatosis: No obvious cardiac hemochromatosis on MRI.  Plan per primary team.  Obstructive sleep apnea: Has been referred back to sleep medicine.  Current medicines are reviewed at length with the patient today.   The patient does not have concerns regarding his medicines.  The following changes were made today:   none  Labs/ tests ordered today include:  Orders Placed This Encounter  Procedures   EKG 12-Lead     Disposition:   FU with Nastashia Gallo 9 months  Signed, Rily Nickey Meredith Leeds, MD  12/21/2021 4:11 PM     [address] [place] [address] [phone] (office) [phone] (fax)

## 2021-12-23 ENCOUNTER — Telehealth: Payer: Self-pay

## 2021-12-23 DIAGNOSIS — E539 Vitamin B deficiency, unspecified: Secondary | ICD-10-CM

## 2021-12-23 MED ORDER — ALCOHOL PREP PADS: MEDICATED_PAD | 0 refills | Status: AC

## 2021-12-23 MED ORDER — B-12 COMPLIANCE INJECTION 1000 MCG/ML IJ KIT: PACK | INTRAMUSCULAR | 0 refills | Status: DC

## 2021-12-23 MED ORDER — "SYRINGE/NEEDLE (DISP) 23G X 1"" 3 ML MISC": 0 refills | Status: DC

## 2021-12-23 MED ORDER — SHARPS CONTAINER MISC: 1.0000 | 0 refills | Status: AC | PRN

## 2021-12-23 NOTE — Telephone Encounter (Signed)
Spoke with pt Daughter per DPR informed her of PT b12 they will call the office when they get the medication so we can do a nurse visit to do pt education so  I can show her how to give him the B12 shots and they can do them at home,.  ?

## 2021-12-23 NOTE — Telephone Encounter (Signed)
-----   Message from Cameron Sprang, MD sent at 12/23/2021 12:01 PM EST ----- ?Pls let daughter know his B12 level is low, would recommend B12 injections, patient will need 1000 mcg daily for 1 week, then weekly for 1 month, then monthly for a year. Would it be easier/closer for him to do this with his PCP or would they like [place] to do it? Other option is we teach someone in household to give injections and they do it for him. Thanks ? ?

## 2021-12-24 ENCOUNTER — Other Ambulatory Visit: Payer: Self-pay

## 2021-12-24 DIAGNOSIS — E539 Vitamin B deficiency, unspecified: Secondary | ICD-10-CM

## 2021-12-24 MED ORDER — CYANOCOBALAMIN 1000 MCG/ML IJ SOLN: 1000.0000 ug | Freq: Once | INTRAMUSCULAR | 0 refills | Status: AC

## 2021-12-28 ENCOUNTER — Other Ambulatory Visit: Payer: Self-pay | Admitting: Adult Health

## 2021-12-28 ENCOUNTER — Other Ambulatory Visit: Payer: Self-pay

## 2021-12-28 ENCOUNTER — Ambulatory Visit (INDEPENDENT_AMBULATORY_CARE_PROVIDER_SITE_OTHER): Payer: Medicare Other | Admitting: Cardiology

## 2021-12-28 ENCOUNTER — Encounter: Payer: Self-pay | Admitting: Cardiology

## 2021-12-28 VITALS — BP 158/84 | HR 67 | Ht 65.0 in | Wt 204.0 lb

## 2021-12-28 DIAGNOSIS — G4733 Obstructive sleep apnea (adult) (pediatric): Secondary | ICD-10-CM

## 2021-12-28 DIAGNOSIS — N182 Chronic kidney disease, stage 2 (mild): Secondary | ICD-10-CM

## 2021-12-28 DIAGNOSIS — E1122 Type 2 diabetes mellitus with diabetic chronic kidney disease: Secondary | ICD-10-CM | POA: Diagnosis not present

## 2021-12-28 DIAGNOSIS — Z794 Long term (current) use of insulin: Secondary | ICD-10-CM

## 2021-12-28 DIAGNOSIS — I5020 Unspecified systolic (congestive) heart failure: Secondary | ICD-10-CM

## 2021-12-28 DIAGNOSIS — I447 Left bundle-branch block, unspecified: Secondary | ICD-10-CM | POA: Diagnosis not present

## 2021-12-28 DIAGNOSIS — Z87891 Personal history of nicotine dependence: Secondary | ICD-10-CM

## 2021-12-28 DIAGNOSIS — I10 Essential (primary) hypertension: Secondary | ICD-10-CM

## 2021-12-28 NOTE — Patient Instructions (Signed)

## 2021-12-28 NOTE — Progress Notes (Signed)
Cardiology Office Note:    Date:  12/28/2021   ID:  Kamin, Niblack Sep 17, 1946, MRN [id number]  PCP:  [place], Blondell Reveal, MD  Cardiologist:  Jenne Campus, MD    Referring MD: No ref. provider found   No chief complaint on file. I am doing fine I am a little weak and tired  History of Present Illness:    Brett [name] is a 76 y.o. 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  His ejection fraction in June 2022 was 30 to 35% moderate LVH normal RV, left heart cardiac cath done in June 2022 showed no coronary artery disease.  He did have MRI done at that time which showed moderate LV dilatation ejection fraction 35% mid myocardial basal septal and basal mid inferior late gadolinium enlargement not suggestive of involvement of hemochromatosis, repeated echocardiogram in February 2023 showed ejection fraction 30 to 35%, he also had a history of hyperlipidemia, type 2 diabetes, gout, obstructive sleep apnea, essential hypertension, hereditary hemochromatosis.  Last phlebotomy done in November 2020.  He does have BiV pacing defibrillator which is a Medtronic device. He comes today to my office for follow-up.  Overall he seems to be doing well.  He is very sedentary there was a referral to cardiac rehab done but he did not hear anything from them.  He denies have any chest pain tightness squeezing pressure burning chest just fatigue tiredness and shortness of breath.  Past Medical History:  Diagnosis Date   Acid reflux    Allergy    Colon polyp    Diabetes mellitus without complication ([place])    Dilated cardiomyopathy ([place])    non isch   Gout    Gout    Hemochromatosis    Hypertension    OSA (obstructive sleep apnea)    Urine frequency     Past Surgical History:  Procedure Laterality Date   BIV ICD INSERTION CRT-D N/A 09/13/2021   Procedure: BIV ICD INSERTION CRT-D;  Surgeon: Constance Haw, MD;  Location: [facility];  Service: Cardiovascular;  Laterality: N/A;   EYE SURGERY Bilateral    Cataract   RIGHT/LEFT HEART CATH AND CORONARY ANGIOGRAPHY N/A 03/29/2021   Procedure: RIGHT/LEFT HEART CATH AND CORONARY ANGIOGRAPHY;  Surgeon: Nigel Mormon, MD;  Location: [facility];  Service: Cardiovascular;  Laterality: N/A;    Current Medications: Current Meds  Medication Sig   Alcohol Swabs (ALCOHOL PREP) PADS Clean injection site before each injection   allopurinol (ZYLOPRIM) 300 MG tablet TAKE 1 TABLET BY MOUTH  DAILY TO PREVENT GOUT   aspirin 81 MG tablet Take 81 mg by mouth daily.   atorvastatin (LIPITOR) 20 MG tablet TAKE 1 TABLET BY MOUTH  DAILY FOR CHOLESTEROL   bisoprolol (ZEBETA) 10 MG tablet Take 1 tablet (10 mg total) by mouth daily.   Cholecalciferol (VITAMIN D PO) Take 5,000 Units by mouth daily.   Cyanocobalamin (B-12 COMPLIANCE INJECTION) 1000 MCG/ML KIT 1000 mcg daily for 1 week, then weekly for 1 month, then monthly for a year   D-Mannose 500 MG CAPS Take 1,500 mg by mouth daily.   famotidine (PEPCID) 40 MG tablet Take 40 mg by mouth as needed.   FARXIGA 10 MG TABS tablet TAKE 1 TABLET BY MOUTH DAILY   furosemide (LASIX) 20 MG tablet Take 1 tablet (20 mg total) by mouth daily. TAKE 1 TABLET BY MOUTH  DAILY FOR BP, / FLUID  RETENTION / ANKLE SWELLING   glipiZIDE (GLUCOTROL) 10 MG tablet TAKE 1 TABLET BY MOUTH 3  TIMES DAILY WITH MEALS FOR  DIABETES   insulin isophane & regular human (HUMULIN 70/30 MIX) (70-30) 100 UNIT/ML KwikPen Inject into subcutaneous skin 63mn before meals, 45units breakfast, 40units lunch and 22units dinner.   Loratadine (CLARITIN PO) Take 1 tablet by mouth daily. Unknown strength   magnesium oxide (MAG-OX) 400 MG tablet Take 400 mg by mouth daily.   Melatonin 10 MG CAPS Take 10 mg by mouth at bedtime as needed (sleep).   metFORMIN (GLUCOPHAGE-XR) 500 MG 24 hr tablet TAKE 2 TABLETS BY MOUTH  TWICE DAILY WITH MEALS FOR  DIABETES   sacubitril-valsartan  (ENTRESTO) 97-103 MG Take 1 tablet by mouth 2 (two) times daily. Pt needs to keep upcoming appt in Feb and Mar for further refills   sharps container 1 each by Does not apply route as needed.   spironolactone (ALDACTONE) 25 MG tablet Take 1 tablet (25 mg total) by mouth daily.   SYRINGE-NEEDLE, DISP, 3 ML 23G X 1" 3 ML MISC Use new syringe and needle for each injection   tamsulosin (FLOMAX) 0.4 MG CAPS capsule TAKE 1 CAPSULE BY MOUTH AT  BEDTIME FOR PROSTATE     Allergies:   Patient has no known allergies.   Social History   Socioeconomic History   Marital status: Divorced    Spouse name: Not on file   Number of children: Not on file   Years of education: Not on file   Highest education level: Not on file  Occupational History   Not on file  Tobacco Use   Smoking status: Former    Packs/day: 3.00    Years: 19.00    Pack years: 57.00    Types: Cigarettes    Start date: 10/24/1957    Quit date: 11/24/1976    Years since quitting: 45.1    Passive exposure: Past   Smokeless tobacco: Never  Vaping Use   Vaping Use: Never used  Substance and Sexual Activity   Alcohol use: Not Currently    Comment: Occasionally   Drug use: No   Sexual activity: Not on file  Other Topics Concern   Not on file  Social History Narrative   Pt lives w/ ex wife   Right handed    Social Determinants of Health   Financial Resource Strain: Not on file  Food Insecurity: Not on file  Transportation Needs: Not on file  Physical Activity: Not on file  Stress: Not on file  Social Connections: Not on file     Family History: The patient's family history includes Cancer in his maternal uncle; Diabetes in his brother, father, and another family member; Heart attack in his brother; Heart disease in an other family member; Hyperlipidemia in an other family member; Hypertension in an other family member. ROS:   Please see the history of present illness.    All 14 point review of systems negative except as  described per history of present illness  EKGs/Labs/Other Studies Reviewed:      Recent Labs: 03/25/2021: ALT 28 05/10/2021: Magnesium 2.4 09/10/2021: Hemoglobin 16.5; Platelet Count 192 12/17/2021: B Natriuretic Peptide 139.1; BUN 19; Creatinine, Ser 1.12; Potassium 4.5; Sodium 139 12/21/2021: TSH 2.48  Recent Lipid Panel    Component Value Date/Time   CHOL 111 03/26/2021 0326   TRIG 42 03/26/2021 0326   HDL 39 (L) 03/26/2021 0326   CHOLHDL 2.8 03/26/2021 0326   VLDL 8  03/26/2021 0326   LDLCALC 64 03/26/2021 0326   LDLCALC 56 07/06/2020 1429    Physical Exam:    VS:  BP (!) 158/84    Pulse 67    Ht _0  (1.651 m)    Wt 204 lb (92.5 kg)    SpO2 97%    BMI 33.95 kg/m     Wt Readings from Last 3 Encounters:  12/28/21 204 lb (92.5 kg)  12/21/21 207 lb 9.6 oz (94.2 kg)  12/21/21 208 lb (94.3 kg)     GEN:  Well nourished, well developed in no acute distress HEENT: Normal NECK: No JVD; No carotid bruits LYMPHATICS: No lymphadenopathy CARDIAC: RRR, no murmurs, no rubs, no gallops RESPIRATORY:  Clear to auscultation without rales, wheezing or rhonchi  ABDOMEN: Soft, non-tender, non-distended MUSCULOSKELETAL:  No edema; No deformity  SKIN: Warm and dry LOWER EXTREMITIES: no swelling NEUROLOGIC:  Alert and oriented x 3 PSYCHIATRIC:  Normal affect   ASSESSMENT:    1. HFrEF (heart failure with reduced ejection fraction) ([place])   2. LBBB (left bundle branch block)   3. OSA (obstructive sleep apnea)   4. Type 2 diabetes mellitus with stage 2 chronic kidney disease, with long-term current use of insulin ([place])   5. Former smoker    PLAN:    In order of problems listed above:  Chronic systolic congestive heart failure, nonischemic cardiomyopathy with Medtronic CRT-D device.  Etiology of this phenomenon is unclear.  His coronary arteries are looking quite fine.  Please heart MRI did not show possibility of hemochromatosis, he seems to be hemodynamically stable on guideline  directed medical therapy.  I will continue present management.  He has been follow-up by: Advanced congestive heart failure clinic.  And I appreciate their expertise. Left bundle branch block: That being addressed with a BiV pacing. Obstructive sleep apnea: It is not being treated.  He was referred back to our sleep specialist. Essential hypertension blood pressure elevated today.  I asked him to check blood pressure at home on the regular basis and results to me within the next 2 weeks. Her to tolerate hemochromatosis.  Stable last phlebotomy in November 2020.  Look like MRI did not show cardiac involvement of hemochromatosis. ICD present this is CRT-D device.  Medtronic, normal function no discharges.   Medication Adjustments/Labs and Tests Ordered: Current medicines are reviewed at length with the patient today.  Concerns regarding medicines are outlined above.  No orders of the defined types were placed in this encounter.  Medication changes: No orders of the defined types were placed in this encounter.   Signed, Park Liter, MD, [facility] 12/28/2021 11:29 AM    [place]

## 2021-12-31 ENCOUNTER — Ambulatory Visit (INDEPENDENT_AMBULATORY_CARE_PROVIDER_SITE_OTHER): Payer: Medicare Other

## 2021-12-31 ENCOUNTER — Other Ambulatory Visit: Payer: Self-pay

## 2021-12-31 DIAGNOSIS — E539 Vitamin B deficiency, unspecified: Secondary | ICD-10-CM

## 2021-12-31 MED ORDER — CYANOCOBALAMIN 1000 MCG/ML IJ SOLN
1000.0000 ug | Freq: Once | INTRAMUSCULAR | Status: AC
  Administered 2021-12-31: 1000 ug via INTRAMUSCULAR

## 2021-12-31 NOTE — Progress Notes (Signed)
Pt and his daughter came in for B12 injection teaching, they will being his shots at home. Pt Daughter was shown how to give the injections no question at this time, she was advised that if she does have questions she can call us. Pt was given his 1st injection in the office using pt supply for teaching. Pt tolerated the injection well. Pt daughter was advised to rotate arms when doing the injections so that his arm will not start to hurt. Both pt and his daughter verbalized understanding and left with all questions answered,  ?

## 2022-01-06 ENCOUNTER — Encounter (HOSPITAL_BASED_OUTPATIENT_CLINIC_OR_DEPARTMENT_OTHER): Payer: Self-pay | Admitting: Family Medicine

## 2022-01-06 ENCOUNTER — Other Ambulatory Visit: Payer: Self-pay | Admitting: Adult Health

## 2022-01-07 ENCOUNTER — Other Ambulatory Visit: Payer: Self-pay

## 2022-01-07 ENCOUNTER — Inpatient Hospital Stay: Payer: Medicare Other | Attending: Oncology

## 2022-01-07 DIAGNOSIS — D509 Iron deficiency anemia, unspecified: Secondary | ICD-10-CM | POA: Insufficient documentation

## 2022-01-07 LAB — FERRITIN: Ferritin: 75 ng/mL (ref 24–336)

## 2022-01-07 MED ORDER — ALLOPURINOL 300 MG PO TABS
ORAL_TABLET | ORAL | 1 refills | Status: DC
Start: 2022-01-07 — End: 2022-06-29

## 2022-01-10 ENCOUNTER — Telehealth: Payer: Self-pay

## 2022-01-10 NOTE — Telephone Encounter (Signed)
-----   Message from [name], MD sent at 01/07/2022  3:05 PM EDT ----- ?Please call patient, ferritin remains in goal range, follow-up as scheduled ? ?

## 2022-01-10 NOTE — Telephone Encounter (Signed)
V /M left with information. Pt informed if he has any questions to return call to office. ?

## 2022-01-17 ENCOUNTER — Other Ambulatory Visit: Payer: Self-pay | Admitting: Adult Health

## 2022-01-17 ENCOUNTER — Encounter (HOSPITAL_BASED_OUTPATIENT_CLINIC_OR_DEPARTMENT_OTHER): Payer: Self-pay | Admitting: Family Medicine

## 2022-01-17 DIAGNOSIS — I10 Essential (primary) hypertension: Secondary | ICD-10-CM

## 2022-01-18 ENCOUNTER — Encounter: Payer: Self-pay | Admitting: Cardiology

## 2022-01-19 ENCOUNTER — Other Ambulatory Visit: Payer: Self-pay

## 2022-01-19 ENCOUNTER — Encounter: Payer: Self-pay | Admitting: Pulmonary Disease

## 2022-01-19 ENCOUNTER — Ambulatory Visit (INDEPENDENT_AMBULATORY_CARE_PROVIDER_SITE_OTHER): Payer: Medicare Other | Admitting: Pulmonary Disease

## 2022-01-19 VITALS — BP 122/60 | HR 62 | Ht 65.0 in | Wt 208.6 lb

## 2022-01-19 DIAGNOSIS — G4719 Other hypersomnia: Secondary | ICD-10-CM | POA: Diagnosis not present

## 2022-01-19 DIAGNOSIS — G4733 Obstructive sleep apnea (adult) (pediatric): Secondary | ICD-10-CM

## 2022-01-19 MED ORDER — ENTRESTO 97-103 MG PO TABS: 1.0000 | ORAL_TABLET | Freq: Two times a day (BID) | ORAL | 3 refills | Status: DC

## 2022-01-19 MED ORDER — DAPAGLIFLOZIN PROPANEDIOL 10 MG PO TABS: 10.0000 mg | ORAL_TABLET | Freq: Every day | ORAL | 2 refills | Status: DC

## 2022-01-19 NOTE — Progress Notes (Signed)
? ?      ?Brett [name]    [id number]    December 02, 1945 ? ?Primary Care Physician:de [place], Blondell Reveal, MD ? ?Referring Physician: de [place], Raymond J, MD ?[address] ?[place],  [place] [address] ? ?Chief complaint:   ?Evaluation for obstructive sleep apnea ? ?HPI: ? ?Had a sleep study done about 20 years ago, used CPAP for very few days ?Did have a facial rash with it and stopped using it ? ?Has been having more issues with his sleep recently ?Sleepy during the day, tired during the day ? ?Usually goes to bed between 1030 and 11, wakes up finally about 6-7 ?1 awakening, sometimes more ?Side sleeper ?Denies significant dryness of his mouth in the morning ?Denies significant headaches in the morning ?No night sweats ?He does snore ? ?No family history of sleep apnea ? ?Quit smoking over 50 years ago ? ?Did maintenance work over the years, retired 2004 ? ?He has been less active ? ?History of dilated cardiomyopathy, heart failure with reduced ejection fraction, history of chronic kidney disease, diabetes, diabetic retinopathy ?History of cognitive impairment ? ?Outpatient Encounter Medications as of 01/19/2022  ?Medication Sig  ? Alcohol Swabs (ALCOHOL PREP) PADS Clean injection site before each injection  ? allopurinol (ZYLOPRIM) 300 MG tablet TAKE 1 TABLET BY MOUTH  DAILY TO PREVENT GOUT  ? aspirin 81 MG tablet Take 81 mg by mouth daily.  ? atorvastatin (LIPITOR) 20 MG tablet TAKE 1 TABLET BY MOUTH  DAILY FOR CHOLESTEROL  ? bisoprolol (ZEBETA) 10 MG tablet Take 1 tablet (10 mg total) by mouth daily.  ? Cholecalciferol (VITAMIN D PO) Take 5,000 Units by mouth daily.  ? Cyanocobalamin (B-12 COMPLIANCE INJECTION) 1000 MCG/ML KIT 1000 mcg daily for 1 week, then weekly for 1 month, then monthly for a year  ? D-Mannose 500 MG CAPS Take 1,500 mg by mouth daily.  ? famotidine (PEPCID) 40 MG tablet Take 40 mg by mouth as needed.  ? FARXIGA 10 MG TABS tablet TAKE 1 TABLET BY MOUTH DAILY  ? furosemide (LASIX) 20 MG tablet  Take 1 tablet (20 mg total) by mouth daily. TAKE 1 TABLET BY MOUTH  DAILY FOR BP, / FLUID  RETENTION / ANKLE SWELLING  ? glipiZIDE (GLUCOTROL) 10 MG tablet TAKE 1 TABLET BY MOUTH 3  TIMES DAILY WITH MEALS FOR  DIABETES  ? insulin isophane & regular human (HUMULIN 70/30 MIX) (70-30) 100 UNIT/ML KwikPen Inject into subcutaneous skin 110mn before meals, 45units breakfast, 40units lunch and 22units dinner.  ? Loratadine (CLARITIN PO) Take 1 tablet by mouth daily. Unknown strength  ? magnesium oxide (MAG-OX) 400 MG tablet Take 400 mg by mouth daily.  ? Melatonin 10 MG CAPS Take 10 mg by mouth at bedtime as needed (sleep).  ? metFORMIN (GLUCOPHAGE-XR) 500 MG 24 hr tablet TAKE 2 TABLETS BY MOUTH  TWICE DAILY WITH MEALS FOR  DIABETES  ? sacubitril-valsartan (ENTRESTO) 97-103 MG Take 1 tablet by mouth 2 (two) times daily. Pt needs to keep upcoming appt in Feb and Mar for further refills  ? sharps container 1 each by Does not apply route as needed.  ? spironolactone (ALDACTONE) 25 MG tablet Take 1 tablet (25 mg total) by mouth daily.  ? SYRINGE-NEEDLE, DISP, 3 ML 23G X 1" 3 ML MISC Use new syringe and needle for each injection  ? tamsulosin (FLOMAX) 0.4 MG CAPS capsule TAKE 1 CAPSULE BY MOUTH AT  BEDTIME FOR PROSTATE  ? ?No facility-administered encounter medications on  file as of 01/19/2022.  ? ? ?Allergies as of 01/19/2022  ? (No Known Allergies)  ? ? ?Past Medical History:  ?Diagnosis Date  ? Acid reflux   ? Allergy   ? Colon polyp   ? Diabetes mellitus without complication ([place])   ? Dilated cardiomyopathy ([place])   ? non isch  ? Gout   ? Gout   ? Hemochromatosis   ? Hypertension   ? OSA (obstructive sleep apnea)   ? Urine frequency   ? ? ?Past Surgical History:  ?Procedure Laterality Date  ? BIV ICD INSERTION CRT-D N/A 09/13/2021  ? Procedure: BIV ICD INSERTION CRT-D;  Surgeon: Constance Haw, MD;  Location: [facility];  Service: Cardiovascular;  Laterality: N/A;  ? EYE SURGERY Bilateral   ? Cataract  ?  RIGHT/LEFT HEART CATH AND CORONARY ANGIOGRAPHY N/A 03/29/2021  ? Procedure: RIGHT/LEFT HEART CATH AND CORONARY ANGIOGRAPHY;  Surgeon: Nigel Mormon, MD;  Location: [facility];  Service: Cardiovascular;  Laterality: N/A;  ? ? ?Family History  ?Problem Relation Age of Onset  ? Diabetes Father   ? Diabetes Other   ? Hypertension Other   ? Hyperlipidemia Other   ? Heart disease Other   ? Cancer Maternal Uncle   ?     brain  ? Heart attack Brother   ?     53  ? Diabetes Brother   ? ? ?Social History  ? ?Socioeconomic History  ? Marital status: Divorced  ?  Spouse name: Not on file  ? Number of children: Not on file  ? Years of education: Not on file  ? Highest education level: Not on file  ?Occupational History  ? Not on file  ?Tobacco Use  ? Smoking status: Former  ?  Packs/day: 3.00  ?  Years: 19.00  ?  Pack years: 57.00  ?  Types: Cigarettes  ?  Start date: 10/24/1957  ?  Quit date: 11/24/1976  ?  Years since quitting: 45.1  ?  Passive exposure: Past  ? Smokeless tobacco: Never  ?Vaping Use  ? Vaping Use: Never used  ?Substance and Sexual Activity  ? Alcohol use: Not Currently  ?  Comment: Occasionally  ? Drug use: No  ? Sexual activity: Not on file  ?Other Topics Concern  ? Not on file  ?Social History Narrative  ? Pt lives w/ ex wife  ? Right handed   ? ?Social Determinants of Health  ? ?Financial Resource Strain: Not on file  ?Food Insecurity: Not on file  ?Transportation Needs: Not on file  ?Physical Activity: Not on file  ?Stress: Not on file  ?Social Connections: Not on file  ?Intimate Partner Violence: Not on file  ? ? ?Review of Systems  ?Constitutional:  Positive for fatigue.  ?Respiratory:  Positive for apnea.   ?Psychiatric/Behavioral:  Positive for sleep disturbance.   ? ?Vitals:  ? 01/19/22 1007  ?BP: 122/60  ?Pulse: 62  ?SpO2: 99%  ? ? ? ?Physical Exam ?Constitutional:   ?   Appearance: He is obese.  ?HENT:  ?   Nose: Nose normal.  ?   Mouth/Throat:  ?   Mouth: Mucous membranes are moist.  ?    Comments: Mallampati 4, crowded oropharynx, macroglossia ?Eyes:  ?   Pupils: Pupils are equal, round, and reactive to light.  ?Cardiovascular:  ?   Rate and Rhythm: Normal rate and regular rhythm.  ?   Heart sounds: No murmur heard. ?  No friction rub.  ?Pulmonary:  ?   Effort: No respiratory distress.  ?   Breath sounds: No stridor. No wheezing or rhonchi.  ?Musculoskeletal:  ?   Cervical back: No rigidity or tenderness.  ?Neurological:  ?   Mental Status: He is alert.  ?Psychiatric:     ?   Mood and Affect: Mood normal.  ? ? ?  01/19/2022  ? 10:00 AM  ?Results of the [place] flowsheet  ?Sitting and reading 2  ?Watching TV 2  ?Sitting, inactive in a public place (e.g. a theatre or a meeting) 2  ?As a passenger in a car for an hour without a break 1  ?Lying down to rest in the afternoon when circumstances permit 3  ?Sitting and talking to someone 1  ?Sitting quietly after a lunch without alcohol 2  ?In a car, while stopped for a few minutes in traffic 0  ?Total score 13  ? ? ? ?Data Reviewed: ?Previous sleep study is not available ?Echocardiogram with ejection fraction of 30 to 35%, normal right ventricular pressures ? ?Assessment:  ?History of obstructive sleep apnea ?-Not currently being treated ? ?Obesity ?-Importance of weight loss ?-Regular exercise and diet ? ?Excessive daytime sleepiness ?-Likely related to untreated sleep disordered breathing ? ? ? ?Plan/Recommendations: ?Patient already scheduled for a sleep study, scheduled in the next month ? ?We will follow-up with him following his sleep study and initiation of treatment ? ?He does have a prior history of obstructive sleep apnea ? ?I did encourage his daughter to try and get him more information regarding sleep apnea and the need to treated ? ?I did discuss the risk of not treating sleep disordered breathing ? ?Weight loss efforts encouraged ? ?Sherrilyn Rist MD ?[facility] ?01/19/2022, 10:13 AM ? ?CC: [place], Blondell Reveal,  MD ? ? ?

## 2022-01-19 NOTE — Patient Instructions (Signed)
We will follow up after the sleep study ? ?Need to get him to buy into treating the sleep apnea ? ?He does take a little bit of time to adjust to CPAP ? ?Regular exercises encouraged ? ?Follow-up in 3 months ? ? ?Sleep Apnea ?Sleep apnea affects breathing during sleep. It causes breathing to stop for 10 seconds or more, or to become shallow. People with sleep apnea usually snore loudly. ?It can also increase the risk of: ?Heart attack. ?Stroke. ?Being very overweight (obese). ?Diabetes. ?Heart failure. ?Irregular heartbeat. ?High blood pressure. ?The goal of treatment is to help you breathe normally again. ?What are the causes? ?The most common cause of this condition is a collapsed or blocked airway. ?There are three kinds of sleep apnea: ?Obstructive sleep apnea. This is caused by a blocked or collapsed airway. ?Central sleep apnea. This happens when the brain does not send the right signals to the muscles that control breathing. ?Mixed sleep apnea. This is a combination of obstructive and central sleep apnea. ?What increases the risk? ?Being overweight. ?Smoking. ?Having a small airway. ?Being older. ?Being male. ?Drinking alcohol. ?Taking medicines to calm yourself (sedatives or tranquilizers). ?Having family members with the condition. ?Having a tongue or tonsils that are larger than normal. ?What are the signs or symptoms? ?Trouble staying asleep. ?Loud snoring. ?Headaches in the morning. ?Waking up gasping. ?Dry mouth or sore throat in the morning. ?Being sleepy or tired during the day. ?If you are sleepy or tired during the day, you may also: ?Not be able to focus your mind (concentrate). ?Forget things. ?Get angry a lot and have mood swings. ?Feel sad (depressed). ?Have changes in your personality. ?Have less interest in sex, if you are male. ?Be unable to have an erection, if you are male. ?How is this treated? ? ?Sleeping on your side. ?Using a medicine to get rid of mucus in your nose  (decongestant). ?Avoiding the use of alcohol, medicines to help you relax, or certain pain medicines (narcotics). ?Losing weight, if needed. ?Changing your diet. ?Quitting smoking. ?Using a machine to open your airway while you sleep, such as: ?An oral appliance. This is a mouthpiece that shifts your lower jaw forward. ?A CPAP device. This device blows air through a mask when you breathe out (exhale). ?An EPAP device. This has valves that you put in each nostril. ?A BIPAP device. This device blows air through a mask when you breathe in (inhale) and breathe out. ?Having surgery if other treatments do not work. ?Follow these instructions at home: ?Lifestyle ?Make changes that your doctor recommends. ?Eat a healthy diet. ?Lose weight if needed. ?Avoid alcohol, medicines to help you relax, and some pain medicines. ?Do not smoke or use any products that contain nicotine or tobacco. If you need help quitting, ask your doctor. ?General instructions ?Take over-the-counter and prescription medicines only as told by your doctor. ?If you were given a machine to use while you sleep, use it only as told by your doctor. ?If you are having surgery, make sure to tell your doctor you have sleep apnea. You may need to bring your device with you. ?Keep all follow-up visits. ?Contact a doctor if: ?The machine that you were given to use during sleep bothers you or does not seem to be working. ?You do not get better. ?You get worse. ?Get help right away if: ?Your chest hurts. ?You have trouble breathing in enough air. ?You have an uncomfortable feeling in your back, arms, or stomach. ?You have  trouble talking. ?One side of your body feels weak. ?A part of your face is hanging down. ?These symptoms may be an emergency. Get help right away. Call your local emergency services (911 in the [place] ?Do not wait to see if the symptoms will go away. ?Do not drive yourself to the hospital. ?Summary ?This condition affects breathing during  sleep. ?The most common cause is a collapsed or blocked airway. ?The goal of treatment is to help you breathe normally while you sleep. ?This information is not intended to replace advice given to you by your health care provider. Make sure you discuss any questions you have with your health care provider. ?Document Revised: 05/19/2021 Document Reviewed: 09/18/2020 ?Elsevier Patient Education ? [place]. ? ?

## 2022-01-25 ENCOUNTER — Other Ambulatory Visit: Payer: Self-pay | Admitting: Adult Health

## 2022-01-25 ENCOUNTER — Telehealth (HOSPITAL_COMMUNITY): Payer: Self-pay

## 2022-01-25 ENCOUNTER — Encounter (HOSPITAL_COMMUNITY): Payer: Self-pay

## 2022-01-25 DIAGNOSIS — I10 Essential (primary) hypertension: Secondary | ICD-10-CM

## 2022-01-25 NOTE — Telephone Encounter (Signed)
Attempted to call patient daug. [name] in regards to Cardiac Rehab - LM on VM  ?  ?Mailed letter ?

## 2022-01-25 NOTE — Telephone Encounter (Signed)
Pt insurance is active and benefits verified through [facility]. Co-pay $0.00, DED $0.00/$0.00 met, out of pocket $3,600.00/$170.00 met, co-insurance 0%. No pre-authorization required. Passport, 01/25/22 @ 3:00PM, LRV#[id number] ?  ?2ndary insurance is active and benefits verified through [place]. Co-pay $0.00, DED $0.00/$0.00 met, out of pocket $0.00/$0.00 met, co-insurance 0%. No pre-authorization required. Passport, 01/25/22 @ 3:04PM, XIO#[id number]  ?  ?

## 2022-01-31 ENCOUNTER — Encounter: Payer: Self-pay | Admitting: Cardiology

## 2022-02-01 ENCOUNTER — Ambulatory Visit (HOSPITAL_BASED_OUTPATIENT_CLINIC_OR_DEPARTMENT_OTHER): Payer: Medicare Other | Attending: Cardiology | Admitting: Cardiology

## 2022-02-01 DIAGNOSIS — G4733 Obstructive sleep apnea (adult) (pediatric): Secondary | ICD-10-CM | POA: Diagnosis present

## 2022-02-03 NOTE — Procedures (Signed)
? ?  Patient Name: Brett [name], Brett [name] ?Study Date:02/01/2022 ?Gender: Male ?D.O.B: Oct 17, 1946 ?Age (years): 5 ?Referring Provider: Loralie Champagne ?Height (inches): 63 ?Interpreting Physician: Fransico Him MD, ABSM ?Weight (lbs): 208 ?RPSGT: Jorge Ny ?BMI: 37 ?MRN: [id number] ?Neck Size: 18.00 ? ?CLINICAL INFORMATION ?Sleep Study Type: NPSG ? ?Indication for sleep study: Diabetes, Hypertension, Obesity, OSA ? ?Epworth Sleepiness Score: 16 ? ?SLEEP STUDY TECHNIQUE ?As per the AASM Manual for the Scoring of Sleep and Associated Events v2.3 (April 2016) with a hypopnea requiring 4% desaturations. ? ?The channels recorded and monitored were frontal, central and occipital EEG, electrooculogram (EOG), submentalis EMG (chin), nasal and oral airflow, thoracic and abdominal wall motion, anterior tibialis EMG, snore microphone, electrocardiogram, and pulse oximetry. ? ?MEDICATIONS ?Medications self-administered by patient taken the night of the study : N/A ? ?SLEEP ARCHITECTURE ?The study was initiated at 10:41:27 PM and ended at 5:11:53 AM. ? ?Sleep onset time was 85.1 minutes and the sleep efficiency was 47.4%. The total sleep time was 185 minutes. ? ?Stage REM latency was 283.5 minutes. ? ?The patient spent 27.0% of the night in stage N1 sleep, 64.9% in stage N2 sleep, 0.0% in stage N3 and 8.1% in REM. ? ?Alpha intrusion was absent. ? ?Supine sleep was 66.52%. ? ?RESPIRATORY PARAMETERS ?The overall apnea/hypopnea index (AHI) was 15.2 per hour. There were 0 total apneas, including 0 obstructive, 0 central and 0 mixed apneas. There were 47 hypopneas and 11 RERAs. ? ?The AHI during Stage REM sleep was 32.0 per hour. ? ?AHI while supine was 15.6 per hour. ? ?The mean oxygen saturation was 91.0%. The minimum SpO2 during sleep was 86.0%. ? ?moderate snoring was noted during this study. ? ?CARDIAC DATA ?The 2 lead EKG demonstrated NSR with occasional pacemaker generated rhythm. The mean heart rate was 62.1 beats per minute.  Other EKG findings include:Frequent  PVCs. ? ?LEG MOVEMENT DATA ?The total PLMS were 0 with a resulting PLMS index of 0.0. Associated arousal with leg movement index was 23.0 . ? ?IMPRESSIONS ?- Moderate obstructive sleep apnea occurred during this study (AHI = 15.2/h). ?- Mild oxygen desaturation was noted during this study (Min O2 = 86.0%). ?- The patient snored with moderate snoring volume. ?- EKG findings include PVCs. ?- Clinically significant periodic limb movements did not occur during sleep. Associated arousals were significant. ? ?DIAGNOSIS ?- Obstructive Sleep Apnea (G47.33) ?- Nocturnal Hypoxemia (G47.36) ? ?RECOMMENDATIONS ?- Therapeutic CPAP titration to determine optimal pressure required to alleviate sleep disordered breathing. ?- Avoid alcohol, sedatives and other CNS depressants that may worsen sleep apnea and disrupt normal sleep architecture. ?- Sleep hygiene should be reviewed to assess factors that may improve sleep quality. ?- Weight management and regular exercise should be initiated or continued if appropriate. ? ?[Electronically signed] 02/03/2022 11:36 AM ? ?Fransico Him MD, ABSM ?Diplomate, [employer] of Sleep Medicine ?

## 2022-02-08 ENCOUNTER — Telehealth (HOSPITAL_COMMUNITY): Payer: Self-pay

## 2022-02-08 NOTE — Telephone Encounter (Signed)
No response from pt regarding CR.  Closed referral.  

## 2022-02-09 ENCOUNTER — Telehealth: Payer: Self-pay | Admitting: *Deleted

## 2022-02-09 ENCOUNTER — Ambulatory Visit (HOSPITAL_BASED_OUTPATIENT_CLINIC_OR_DEPARTMENT_OTHER): Payer: Medicare Other | Admitting: Family Medicine

## 2022-02-09 DIAGNOSIS — G4733 Obstructive sleep apnea (adult) (pediatric): Secondary | ICD-10-CM

## 2022-02-09 NOTE — Telephone Encounter (Signed)
-----   Message from Lauralee Evener, [place] sent at 02/03/2022 11:39 AM EDT ----- ? ?----- Message ----- ?From: Sueanne Margarita, MD ?Sent: 02/03/2022  11:37 AM EDT ?To: Cv Div Sleep Studies ? ?Please let patient know that they have sleep apnea.  Recommend therapeutic CPAP titration for treatment of patient's sleep disordered breathing.  If unable to perform an in lab titration then initiate ResMed auto CPAP from 4 to 15cm H2O with heated humidity and mask of choice and overnight pulse ox on CPAP.    ? ?

## 2022-02-09 NOTE — Telephone Encounter (Addendum)
The patient has been notified of the result. Left detailed message on voicemail and informed patient to call back to review his results.   ?Marolyn Hammock, [place] 02/09/2022 10:29 AM   ?Return call: ?Patient expressed satisfaction.  ?

## 2022-02-15 ENCOUNTER — Ambulatory Visit (HOSPITAL_COMMUNITY): Payer: Medicare Other

## 2022-02-15 NOTE — Telephone Encounter (Signed)
Prior Authorization for TITRATION sent to [facility] via web portal.  ?Notification or Prior Authorization is not required for the requested services ?Decision ID #:[id number] ?

## 2022-02-23 ENCOUNTER — Encounter (HOSPITAL_BASED_OUTPATIENT_CLINIC_OR_DEPARTMENT_OTHER): Payer: Self-pay | Admitting: Family Medicine

## 2022-02-23 ENCOUNTER — Ambulatory Visit (INDEPENDENT_AMBULATORY_CARE_PROVIDER_SITE_OTHER): Payer: Medicare Other | Admitting: Family Medicine

## 2022-02-23 VITALS — BP 127/65 | HR 66 | Ht 65.0 in | Wt 205.5 lb

## 2022-02-23 DIAGNOSIS — E1122 Type 2 diabetes mellitus with diabetic chronic kidney disease: Secondary | ICD-10-CM

## 2022-02-23 DIAGNOSIS — N182 Chronic kidney disease, stage 2 (mild): Secondary | ICD-10-CM

## 2022-02-23 DIAGNOSIS — G4733 Obstructive sleep apnea (adult) (pediatric): Secondary | ICD-10-CM

## 2022-02-23 DIAGNOSIS — R4189 Other symptoms and signs involving cognitive functions and awareness: Secondary | ICD-10-CM

## 2022-02-23 DIAGNOSIS — Z794 Long term (current) use of insulin: Secondary | ICD-10-CM

## 2022-02-23 NOTE — Assessment & Plan Note (Signed)
Recently met with sleep medicine specialist, sleep study indicated OSA.  He does have another procedure scheduled which sounds to be for CPAP titration.  Encouraged to continue with this in order to appropriately set up CPAP for treatment of underlying sleep apnea ?

## 2022-02-23 NOTE — Progress Notes (Signed)
? ? ?  Procedures performed today:   ? ?None. ? ?Independent interpretation of notes and tests performed by another provider:  ? ?None. ? ?Brief History, Exam, Impression, and Recommendations:   ? ?BP 127/65   Pulse 66   Ht $R'5\' 5"'QC$  (1.651 m)   Wt 205 lb 8 oz (93.2 kg)   SpO2 98%   BMI 34.20 kg/m?  ? ?Patient accompanied today by his daughter ? ?Type 2 diabetes mellitus with stage 2 chronic kidney disease, with long-term current use of insulin ([place]) ?Recent hemoglobin A1c found to be 7.2%.  Feel that this is at goal for patient, reasonable target for patient would be A1c less than about 7.5 to 8.0%.  No changes needed to medications at this time, can continue to monitor A1c moving forward ? ?OSA (obstructive sleep apnea) ?Recently met with sleep medicine specialist, sleep study indicated OSA.  He does have another procedure scheduled which sounds to be for CPAP titration.  Encouraged to continue with this in order to appropriately set up CPAP for treatment of underlying sleep apnea ? ?Cognitive impairment ?Has established with neurology.  Initial laboratory evaluation showed some mild B12 deficiency, he was started on B12 supplementation ?They report that he also has MRI arranged ? ?We will plan for follow-up in about 2 to 3 months to continue to monitor progress or sooner as needed ? ? ?___________________________________________ ?Lakiyah Arntson [place], MD, ABFM, CAQSM ?Primary Care and Sports Medicine ?[place] ?

## 2022-02-23 NOTE — Assessment & Plan Note (Signed)
Recent hemoglobin A1c found to be 7.2%.  Feel that this is at goal for patient, reasonable target for patient would be A1c less than about 7.5 to 8.0%.  No changes needed to medications at this time, can continue to monitor A1c moving forward ?

## 2022-02-23 NOTE — Assessment & Plan Note (Signed)
Has established with neurology.  Initial laboratory evaluation showed some mild B12 deficiency, he was started on B12 supplementation ?They report that he also has MRI arranged ?

## 2022-03-03 ENCOUNTER — Ambulatory Visit (HOSPITAL_COMMUNITY)
Admission: RE | Admit: 2022-03-03 | Discharge: 2022-03-03 | Disposition: A | Discharge status: Home / Self Care | Payer: Medicare Other | Source: Ambulatory Visit | Attending: Neurology | Admitting: Neurology

## 2022-03-03 DIAGNOSIS — F0390 Unspecified dementia without behavioral disturbance: Secondary | ICD-10-CM | POA: Diagnosis present

## 2022-03-03 IMAGING — MR MR HEAD W/O CM
8 of 10 series · 36 of 48 positions shown · non-contrast
Comparison: None Available.

CLINICAL DATA: Memory loss

EXAM:
MRI HEAD WITHOUT CONTRAST
TECHNIQUE: Multiplanar, multiecho pulse sequences of the brain and surrounding
structures were obtained without intravenous contrast.

[Series 3: DWI · axial · 3.0mm · 1.09mm/px · z∈[-103,+51]mm · 9 of 110 slices shown (1 of 4)]
[im 1/110]
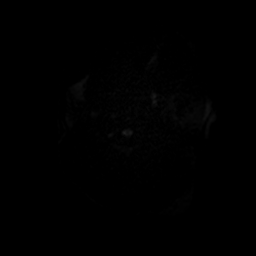
[im 14/110]
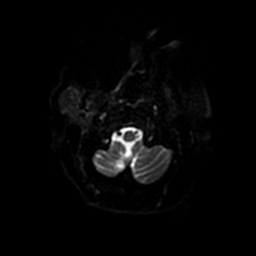
[im 28/110]
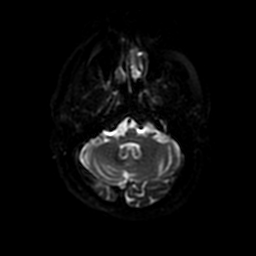
[im 41/110]
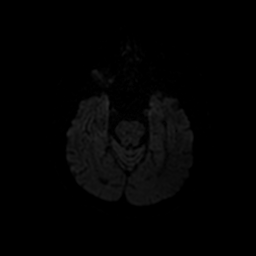
[im 55/110]
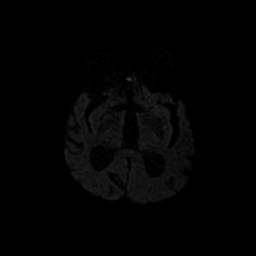
[im 69/110]
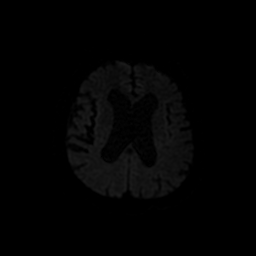
[im 82/110]
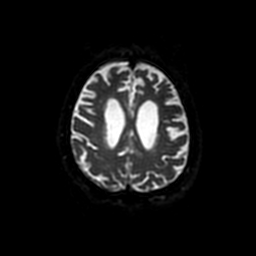
[im 96/110]
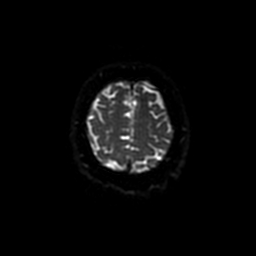
[im 110/110]
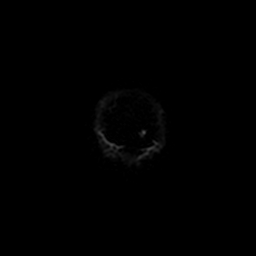

[Series 4: DWI · coronal · 5.0mm · 1.09mm/px · 7 of 74 slices shown (2 of 4)]
[im 1/74]
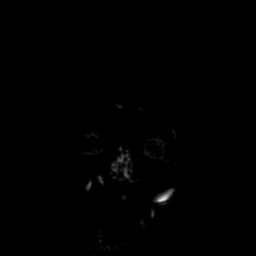
[im 13/74]
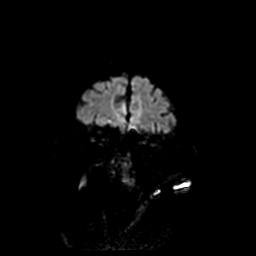
[im 25/74]
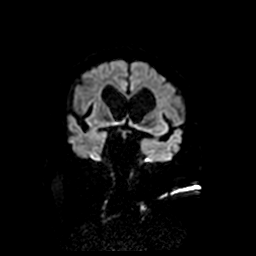
[im 37/74]
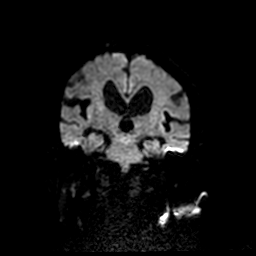
[im 49/74]
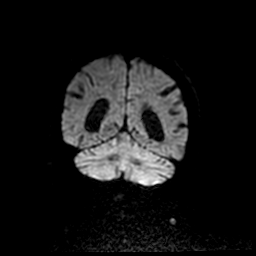
[im 61/74]
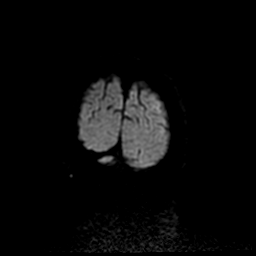
[im 74/74]
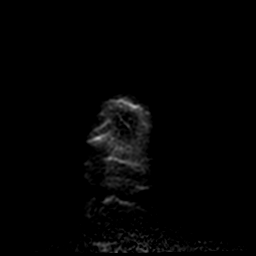

[Series 5: T1 · sagittal · 5.0mm · 0.47mm/px · 3 of 27 slices shown]
[im 1/27]
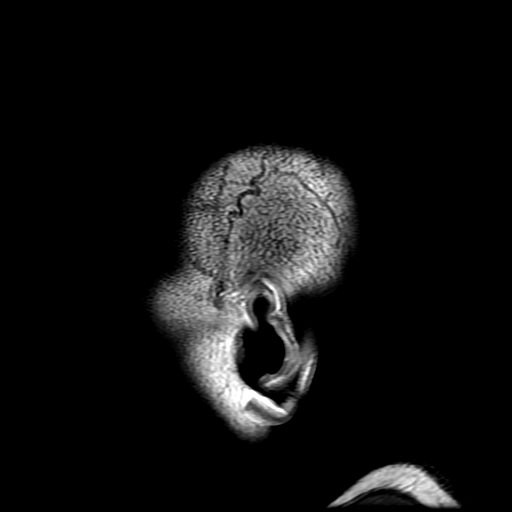
[im 14/27]
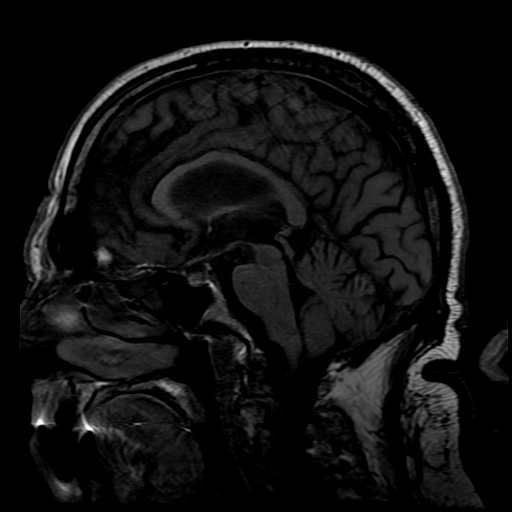
[im 27/27]
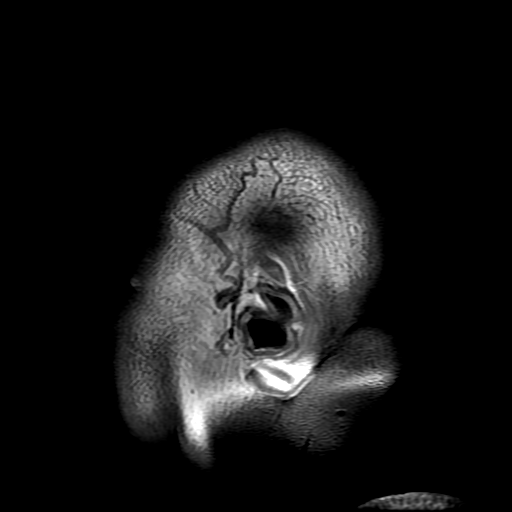

[Series 6: T2 · axial · 5.0mm · 0.43mm/px · z∈[-100,+55]mm · 3 of 28 slices shown (1 of 2)]
[im 1/28]
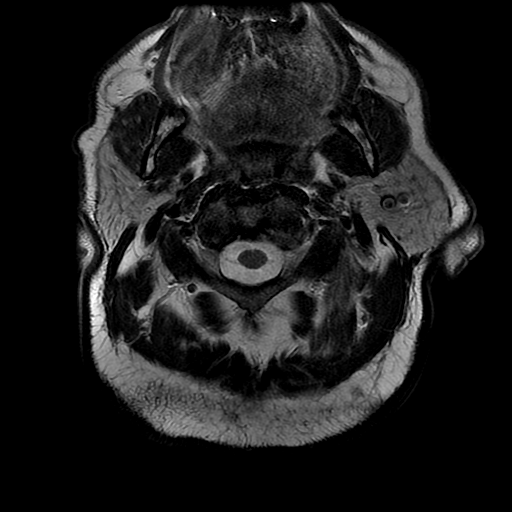
[im 14/28]
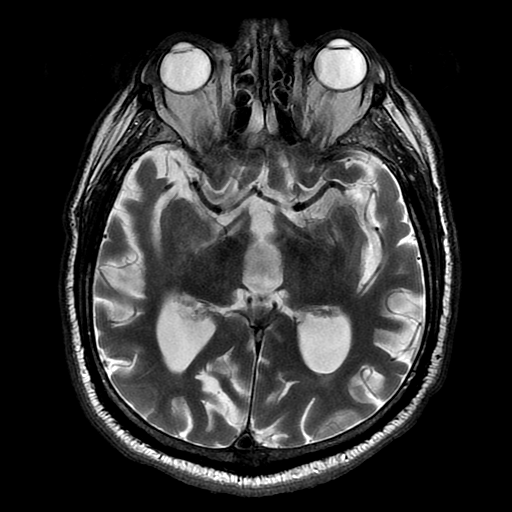
[im 28/28]
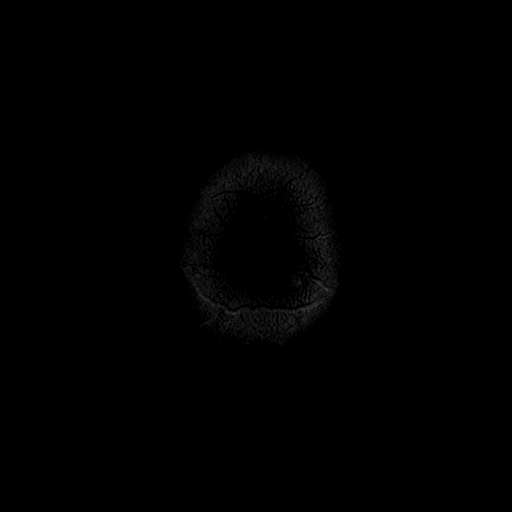

[Series 7: FLAIR · axial · 3.0mm · 0.43mm/px · z∈[-100,+55]mm · 3 of 28 slices shown]
[im 1/28]
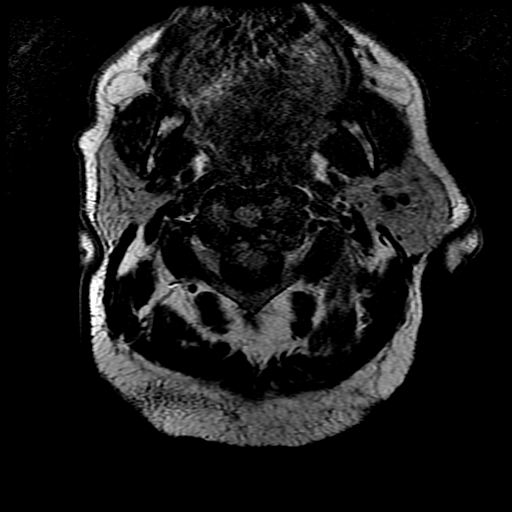
[im 14/28]
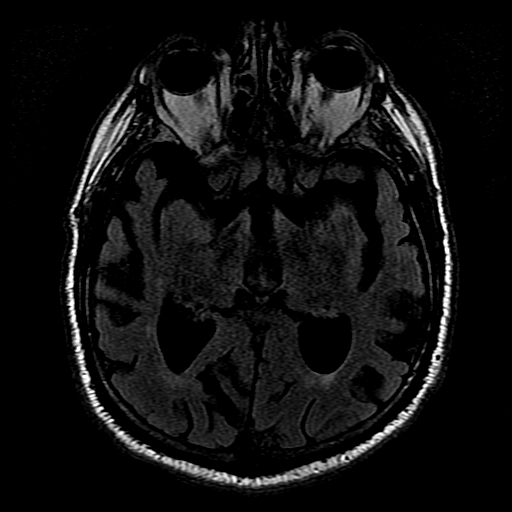
[im 28/28]
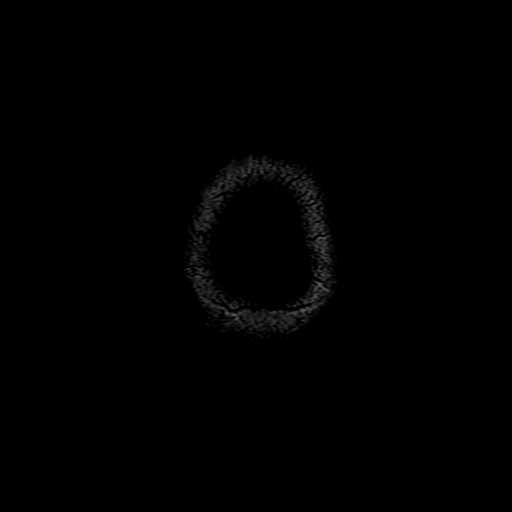

[Series 10: T2 · coronal · 5.0mm · 0.43mm/px · 3 of 30 slices shown (2 of 2)]
[im 1/30]
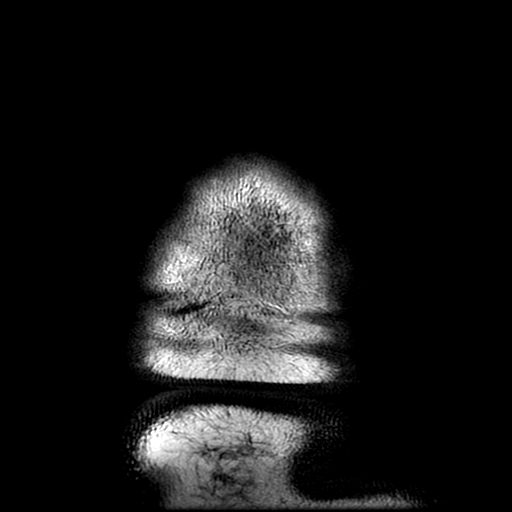
[im 15/30]
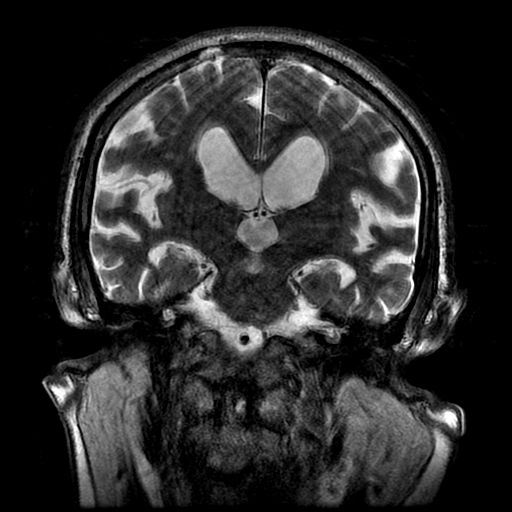
[im 30/30]
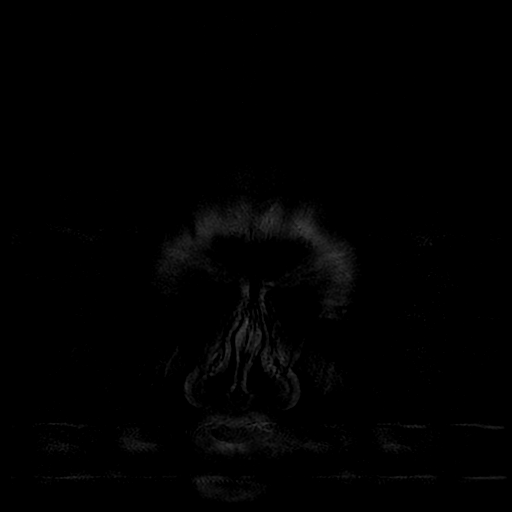

[Series 300: DWI · axial · 3.0mm · 1.09mm/px · z∈[-103,+51]mm · 5 of 55 slices shown (3 of 4)]
[im 1/55]
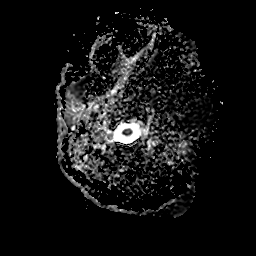
[im 14/55]
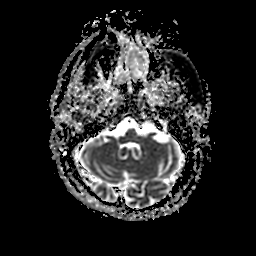
[im 28/55]
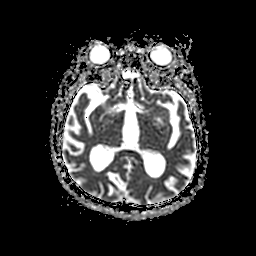
[im 41/55]
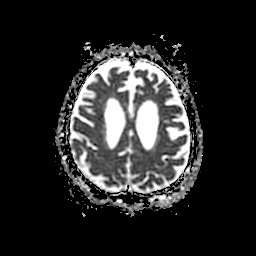
[im 55/55]
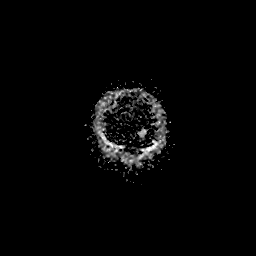

[Series 400: DWI · coronal · 5.0mm · 1.09mm/px · 3 of 37 slices shown (4 of 4)]
[im 1/37]
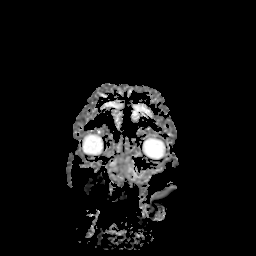
[im 19/37]
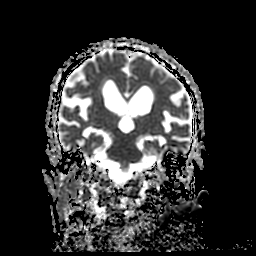
[im 37/37]
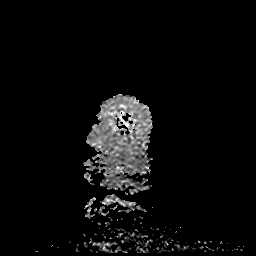

[36 of 48 positions shown; findings below may reference images not displayed]

FINDINGS: Brain: No restricted diffusion to suggest acute or subacute infarct.
No acute hemorrhage, mass, mass effect, or midline shift. Moderate
to severely advanced cerebral atrophy for age, without lobar
predominance. Scattered T2 hyperintense signal in the
periventricular white matter, likely the sequela of moderate chronic
small vessel ischemic disease. No hydrocephalus or extra-axial
collection. No significant hemosiderin deposition to suggest remote
hemorrhage or superficial siderosis.

Vascular: Normal flow voids.

Skull and upper cervical spine: Normal marrow signal.

Sinuses/Orbits: Mucosal thickening in the right maxillary sinus and
ethmoid air cells. Status post bilateral lens replacements.

Other: Trace fluid in the mastoid air cells.
IMPRESSION: No acute intracranial process. Moderate to severely advanced
cerebral atrophy for age, without lobar predominance.

## 2022-03-03 NOTE — Progress Notes (Signed)
Patient here today at [facility] for MRI brain wo contrast. Patient has medtronic device. CLE sent. Orders received for DOO 80. Will re-program once scan is completed.  ?

## 2022-03-07 ENCOUNTER — Telehealth: Payer: Self-pay | Admitting: Neurology

## 2022-03-07 NOTE — Telephone Encounter (Signed)
Patients daughter called and stated she missed a call.  She thinks it is for Jans MRI results. ?

## 2022-03-07 NOTE — Telephone Encounter (Signed)
Pt call and message was left to call back ?

## 2022-03-10 NOTE — Telephone Encounter (Signed)
See result notes pt daughter was called back

## 2022-03-14 ENCOUNTER — Ambulatory Visit (INDEPENDENT_AMBULATORY_CARE_PROVIDER_SITE_OTHER): Payer: Medicare Other

## 2022-03-14 DIAGNOSIS — I429 Cardiomyopathy, unspecified: Secondary | ICD-10-CM | POA: Diagnosis not present

## 2022-03-15 LAB — CUP PACEART REMOTE DEVICE CHECK
Battery Remaining Longevity: 104 mo
Battery Voltage: 3.01 V
Brady Statistic AP VP Percent: 36.44 %
Brady Statistic AP VS Percent: 0.59 %
Brady Statistic AS VP Percent: 60.96 %
Brady Statistic AS VS Percent: 2.02 %
Brady Statistic RA Percent Paced: 35.46 %
Brady Statistic RV Percent Paced: 30.58 %
Date Time Interrogation Session: 20230522043623
HighPow Impedance: 58 Ohm
Implantable Lead Implant Date: 20221121
Implantable Lead Implant Date: 20221121
Implantable Lead Implant Date: 20221121
Implantable Lead Location: 753858
Implantable Lead Location: 753859
Implantable Lead Location: 753860
Implantable Lead Model: 4598
Implantable Lead Model: 5076
Implantable Pulse Generator Implant Date: 20221121
Lead Channel Impedance Value: 136.276
Lead Channel Impedance Value: 139.967
Lead Channel Impedance Value: 149.697
Lead Channel Impedance Value: 168.889
Lead Channel Impedance Value: 174.595
Lead Channel Impedance Value: 247 Ohm
Lead Channel Impedance Value: 304 Ohm
Lead Channel Impedance Value: 304 Ohm
Lead Channel Impedance Value: 323 Ohm
Lead Channel Impedance Value: 342 Ohm
Lead Channel Impedance Value: 380 Ohm
Lead Channel Impedance Value: 399 Ohm
Lead Channel Impedance Value: 437 Ohm
Lead Channel Impedance Value: 456 Ohm
Lead Channel Impedance Value: 494 Ohm
Lead Channel Impedance Value: 570 Ohm
Lead Channel Impedance Value: 589 Ohm
Lead Channel Impedance Value: 589 Ohm
Lead Channel Pacing Threshold Amplitude: 0.75 V
Lead Channel Pacing Threshold Amplitude: 0.875 V
Lead Channel Pacing Threshold Amplitude: 0.875 V
Lead Channel Pacing Threshold Pulse Width: 0.4 ms
Lead Channel Pacing Threshold Pulse Width: 0.4 ms
Lead Channel Pacing Threshold Pulse Width: 0.4 ms
Lead Channel Sensing Intrinsic Amplitude: 18.125 mV
Lead Channel Sensing Intrinsic Amplitude: 18.125 mV
Lead Channel Sensing Intrinsic Amplitude: 2.5 mV
Lead Channel Sensing Intrinsic Amplitude: 2.5 mV
Lead Channel Setting Pacing Amplitude: 1.5 V
Lead Channel Setting Pacing Amplitude: 1.5 V
Lead Channel Setting Pacing Amplitude: 1.75 V
Lead Channel Setting Pacing Pulse Width: 0.4 ms
Lead Channel Setting Pacing Pulse Width: 0.4 ms
Lead Channel Setting Sensing Sensitivity: 0.3 mV

## 2022-03-16 ENCOUNTER — Encounter (HOSPITAL_COMMUNITY): Payer: Medicare Other

## 2022-03-22 ENCOUNTER — Ambulatory Visit (HOSPITAL_BASED_OUTPATIENT_CLINIC_OR_DEPARTMENT_OTHER): Payer: Medicare Other | Attending: Cardiology | Admitting: Cardiology

## 2022-03-22 VITALS — Ht 65.0 in | Wt 208.0 lb

## 2022-03-22 DIAGNOSIS — G4733 Obstructive sleep apnea (adult) (pediatric): Secondary | ICD-10-CM | POA: Diagnosis present

## 2022-03-23 NOTE — Procedures (Signed)
   Patient Name: Brett, [name] Date: 03/22/2022 Gender: Male D.O.B: 27-May-1946 Age (years): 59 Referring Provider: Loralie Champagne Height (inches): 65 Interpreting Physician: Fransico Him MD, ABSM Weight (lbs): 208 RPSGT: Gwenyth Allegra BMI: 35 MRN: [id number] Neck Size: 18.00  CLINICAL INFORMATION The patient is referred for a CPAP titration to treat sleep apnea.  SLEEP STUDY TECHNIQUE As per the AASM Manual for the Scoring of Sleep and Associated Events v2.3 (April 2016) with a hypopnea requiring 4% desaturations.  The channels recorded and monitored were frontal, central and occipital EEG, electrooculogram (EOG), submentalis EMG (chin), nasal and oral airflow, thoracic and abdominal wall motion, anterior tibialis EMG, snore microphone, electrocardiogram, and pulse oximetry. Continuous positive airway pressure (CPAP) was initiated at the beginning of the study and titrated to treat sleep-disordered breathing.  MEDICATIONS Medications self-administered by patient taken the night of the study : N/A  TECHNICIAN COMMENTS Comments added by technician: None Comments added by scorer: N/A  RESPIRATORY PARAMETERS Optimal PAP Pressure (cm): 13  AHI at Optimal Pressure (/hr):0 Overall Minimal O2 (%):91.0  Supine % at Optimal Pressure (%):68 Minimal O2 at Optimal Pressure (%): 92.0   SLEEP ARCHITECTURE The study was initiated at 9:58:11 PM and ended at 4:39:05 AM.  Sleep onset time was 98.1 minutes and the sleep efficiency was 22.8%. The total sleep time was 91.5 minutes.  The patient spent 47.5% of the night in stage N1 sleep, 52.5% in stage N2 sleep, 0.0% in stage N3 and 0% in REM.Stage REM latency was N/A minutes  Wake after sleep onset was 211.3. Alpha intrusion was absent. Supine sleep was 45.36%.  CARDIAC DATA The 2 lead EKG demonstrated NSR. The mean heart rate was 60.9 beats per minute. Other EKG findings include:PVCs.  LEG MOVEMENT DATA The total Periodic Limb  Movements of Sleep (PLMS) were 0. The PLMS index was 0.0. A PLMS index of <15 is considered normal in adults.  IMPRESSIONS - The optimal PAP pressure was 13 cm of water. - Significant oxygen desaturations were not observed during this titration (min O2 = 91.0%). - The patient snored with soft snoring volume during this titration study. - PVCs were observed during this study. - Clinically significant periodic limb movements were not noted during this study. Arousals associated with PLMs were significant.  DIAGNOSIS - Obstructive Sleep Apnea (G47.33)  RECOMMENDATIONS - Trial of CPAP therapy on 13 cm H2O with a Small size Fisher&Paykel Full Face Simplus mask and heated humidification. - Avoid alcohol, sedatives and other CNS depressants that may worsen sleep apnea and disrupt normal sleep architecture. - Sleep hygiene should be reviewed to assess factors that may improve sleep quality. - Weight management and regular exercise should be initiated or continued. - Return to Sleep Center for re-evaluation after 6 weeks of therapy  [Electronically signed] 03/23/2022 12:37 PM  Fransico Him MD, ABSM Diplomate, American Board of Sleep Medicine

## 2022-03-30 ENCOUNTER — Telehealth: Payer: Self-pay | Admitting: *Deleted

## 2022-03-30 ENCOUNTER — Encounter (HOSPITAL_COMMUNITY): Payer: Medicare Other

## 2022-03-30 NOTE — Telephone Encounter (Signed)
The patient has been notified of the result and verbalized understanding.  All questions (if any) were answered. Brett [name], [place] 03/30/2022 11:49 AM    Upon patient request DME selection is [name] understands he will be contacted by [facility] to set up his cpap. Patient understands to call if [facility] does not contact him with new setup in a timely manner. Patient understands they will be called once confirmation has been received from [employer]/ that they have received their new machine to schedule 10 week follow up appointment.   [name] notified of new cpap order  Please add to airview Patient was grateful for the call and thanked me.

## 2022-03-30 NOTE — Telephone Encounter (Signed)
-----   Message from Lauralee Evener, [place] sent at 03/23/2022  2:00 PM EDT -----  ----- Message ----- From: Sueanne Margarita, MD Sent: 03/23/2022  12:38 PM EDT To: Cv Div Sleep Studies  Please let patient know that they had a successful PAP titration and let DME know that orders are in EPIC.  Please set up 6 week OV with me.

## 2022-03-30 NOTE — Progress Notes (Signed)
Remote ICD transmission.   

## 2022-04-08 ENCOUNTER — Encounter (HOSPITAL_BASED_OUTPATIENT_CLINIC_OR_DEPARTMENT_OTHER): Payer: Medicare Other

## 2022-04-09 ENCOUNTER — Other Ambulatory Visit: Payer: Self-pay | Admitting: Adult Health

## 2022-04-11 ENCOUNTER — Encounter (HOSPITAL_BASED_OUTPATIENT_CLINIC_OR_DEPARTMENT_OTHER): Payer: Self-pay | Admitting: Family Medicine

## 2022-04-13 ENCOUNTER — Telehealth: Payer: Self-pay

## 2022-04-13 ENCOUNTER — Encounter (INDEPENDENT_AMBULATORY_CARE_PROVIDER_SITE_OTHER): Payer: Medicare Other | Admitting: Ophthalmology

## 2022-04-13 DIAGNOSIS — H43813 Vitreous degeneration, bilateral: Secondary | ICD-10-CM | POA: Diagnosis not present

## 2022-04-13 DIAGNOSIS — E113393 Type 2 diabetes mellitus with moderate nonproliferative diabetic retinopathy without macular edema, bilateral: Secondary | ICD-10-CM

## 2022-04-13 DIAGNOSIS — H35373 Puckering of macula, bilateral: Secondary | ICD-10-CM

## 2022-04-13 DIAGNOSIS — I10 Essential (primary) hypertension: Secondary | ICD-10-CM | POA: Diagnosis not present

## 2022-04-13 DIAGNOSIS — H35033 Hypertensive retinopathy, bilateral: Secondary | ICD-10-CM | POA: Diagnosis not present

## 2022-04-13 NOTE — Telephone Encounter (Signed)
Left voicemail for patient to call device clinic back. Patient needs to come in on 04/21/22 to device clinic to have device reprogrammed. Device team please reference email sent by E. Avamarie Crossley regarding Medtronic ICDs

## 2022-04-14 ENCOUNTER — Other Ambulatory Visit (HOSPITAL_BASED_OUTPATIENT_CLINIC_OR_DEPARTMENT_OTHER): Payer: Self-pay

## 2022-04-14 DIAGNOSIS — I10 Essential (primary) hypertension: Secondary | ICD-10-CM

## 2022-04-14 MED ORDER — ATORVASTATIN CALCIUM 20 MG PO TABS: ORAL_TABLET | ORAL | 3 refills | Status: DC

## 2022-04-14 NOTE — Telephone Encounter (Signed)
2nd attempt to contact patient in regard to apt needed. No answer, LMTCB.

## 2022-04-15 NOTE — Telephone Encounter (Signed)
See my chart messages from patients daughter

## 2022-04-21 ENCOUNTER — Encounter: Payer: Self-pay | Admitting: Pulmonary Disease

## 2022-04-21 ENCOUNTER — Encounter (HOSPITAL_BASED_OUTPATIENT_CLINIC_OR_DEPARTMENT_OTHER): Payer: Self-pay | Admitting: Family Medicine

## 2022-04-21 ENCOUNTER — Ambulatory Visit (INDEPENDENT_AMBULATORY_CARE_PROVIDER_SITE_OTHER): Payer: Medicare Other

## 2022-04-21 ENCOUNTER — Ambulatory Visit (INDEPENDENT_AMBULATORY_CARE_PROVIDER_SITE_OTHER): Payer: Medicare Other | Admitting: Pulmonary Disease

## 2022-04-21 VITALS — BP 120/70 | HR 62 | Temp 97.9°F | Ht 65.0 in | Wt 205.0 lb

## 2022-04-21 DIAGNOSIS — I428 Other cardiomyopathies: Secondary | ICD-10-CM

## 2022-04-21 DIAGNOSIS — G4733 Obstructive sleep apnea (adult) (pediatric): Secondary | ICD-10-CM | POA: Diagnosis not present

## 2022-04-21 NOTE — Progress Notes (Signed)
In- clinic check to change VF pathways.

## 2022-04-21 NOTE — Patient Instructions (Signed)
Increase the dose of melatonin to 20 mg at night to see if this helps with number of hours of sleep at night  Consider delayed release melatonin as well to see if this helps  If melatonin is not helping, call us to put in an order for a prescribed sleep aid -Examples will include Ambien, trazodone, Lunesta are options of treatment  Tentative follow-up in 3 months

## 2022-04-21 NOTE — Progress Notes (Signed)
Brett [name]    [id number]    October 05, 1946  Primary Care Physician:[place], Blondell Reveal, MD  Referring Physician: [place], Blondell Reveal, [place],  [place] [address]  Chief complaint:   Evaluation for obstructive sleep apnea  HPI:  Had a sleep study done about 20 years ago, used CPAP for very few days Did have a facial rash with it and stopped using it  Repeat sleep study shows poor sleep efficiency but was titrated to CPAP of 13 which he tolerated well   Concerned about number of hours of sleep Concerned about waking up in the middle of the night just hanging out not able to fall asleep for possibly a few hours before going back to bed  Remains sleepy during the day He does use melatonin and valerian which does help a little bit  Has been having more issues with his sleep recently Sleepy during the day, tired during the day  Usually goes to bed between 1030 and 11, wakes up finally about 6-7 1 awakening, sometimes more Side sleeper Denies significant dryness of his mouth in the morning Denies significant headaches in the morning No night sweats He does snore  No family history of sleep apnea  Quit smoking over 50 years ago  Did maintenance work over the years, retired 2004  He has been less active  History of dilated cardiomyopathy, heart failure with reduced ejection fraction, history of chronic kidney disease, diabetes, diabetic retinopathy History of cognitive impairment  Outpatient Encounter Medications as of 04/21/2022  Medication Sig   Alcohol Swabs (ALCOHOL PREP) PADS Clean injection site before each injection   allopurinol (ZYLOPRIM) 300 MG tablet TAKE 1 TABLET BY MOUTH  DAILY TO PREVENT GOUT   aspirin 81 MG tablet Take 81 mg by mouth daily.   atorvastatin (LIPITOR) 20 MG tablet TAKE 1 TABLET BY MOUTH  DAILY FOR CHOLESTEROL   bisoprolol (ZEBETA) 10 MG tablet Take 1 tablet (10 mg total) by mouth daily.   Cholecalciferol (VITAMIN D  PO) Take 5,000 Units by mouth daily.   Cyanocobalamin (B-12 COMPLIANCE INJECTION) 1000 MCG/ML KIT 1000 mcg daily for 1 week, then weekly for 1 month, then monthly for a year   D-Mannose 500 MG CAPS Take 1,500 mg by mouth daily.   dapagliflozin propanediol (FARXIGA) 10 MG TABS tablet Take 1 tablet (10 mg total) by mouth daily.   famotidine (PEPCID) 40 MG tablet Take 40 mg by mouth as needed.   furosemide (LASIX) 20 MG tablet Take 1 tablet (20 mg total) by mouth daily. TAKE 1 TABLET BY MOUTH  DAILY FOR BP, / FLUID  RETENTION / ANKLE SWELLING   glipiZIDE (GLUCOTROL) 10 MG tablet TAKE 1 TABLET BY MOUTH 3  TIMES DAILY WITH MEALS FOR  DIABETES   insulin isophane & regular human (HUMULIN 70/30 MIX) (70-30) 100 UNIT/ML KwikPen Inject into subcutaneous skin 59mn before meals, 45units breakfast, 40units lunch and 22units dinner.   Loratadine (CLARITIN PO) Take 1 tablet by mouth daily. Unknown strength   magnesium oxide (MAG-OX) 400 MG tablet Take 400 mg by mouth daily.   Melatonin 10 MG CAPS Take 10 mg by mouth at bedtime as needed (sleep).   metFORMIN (GLUCOPHAGE-XR) 500 MG 24 hr tablet TAKE 2 TABLETS BY MOUTH  TWICE DAILY WITH MEALS FOR  DIABETES   sacubitril-valsartan (ENTRESTO) 97-103 MG Take 1 tablet by mouth 2 (two) times daily. Pt needs to keep upcoming appt in Feb  and Mar for further refills   sharps container 1 each by Does not apply route as needed.   spironolactone (ALDACTONE) 25 MG tablet Take 1 tablet (25 mg total) by mouth daily.   SYRINGE-NEEDLE, DISP, 3 ML 23G X 1" 3 ML MISC Use new syringe and needle for each injection   tamsulosin (FLOMAX) 0.4 MG CAPS capsule TAKE 1 CAPSULE BY MOUTH AT  BEDTIME FOR PROSTATE   No facility-administered encounter medications on file as of 04/21/2022.    Allergies as of 04/21/2022   (No Known Allergies)    Past Medical History:  Diagnosis Date   Acid reflux    Allergy    Colon polyp    Diabetes mellitus without complication ([place])    Dilated  cardiomyopathy ([place])    non isch   Gout    Gout    Hemochromatosis    Hypertension    OSA (obstructive sleep apnea)    Urine frequency     Past Surgical History:  Procedure Laterality Date   BIV ICD INSERTION CRT-D N/A 09/13/2021   Procedure: BIV ICD INSERTION CRT-D;  Surgeon: Constance Haw, MD;  Location: [facility];  Service: Cardiovascular;  Laterality: N/A;   EYE SURGERY Bilateral    Cataract   RIGHT/LEFT HEART CATH AND CORONARY ANGIOGRAPHY N/A 03/29/2021   Procedure: RIGHT/LEFT HEART CATH AND CORONARY ANGIOGRAPHY;  Surgeon: Nigel Mormon, MD;  Location: [facility];  Service: Cardiovascular;  Laterality: N/A;    Family History  Problem Relation Age of Onset   Diabetes Father    Diabetes Other    Hypertension Other    Hyperlipidemia Other    Heart disease Other    Cancer Maternal Uncle        brain   Heart attack Brother        47   Diabetes Brother     Social History   Socioeconomic History   Marital status: Divorced    Spouse name: Not on file   Number of children: Not on file   Years of education: Not on file   Highest education level: Not on file  Occupational History   Not on file  Tobacco Use   Smoking status: Former    Packs/day: 3.00    Years: 19.00    Total pack years: 57.00    Types: Cigarettes    Start date: 10/24/1957    Quit date: 11/24/1976    Years since quitting: 45.4    Passive exposure: Past   Smokeless tobacco: Never  Vaping Use   Vaping Use: Never used  Substance and Sexual Activity   Alcohol use: Not Currently    Comment: Occasionally   Drug use: No   Sexual activity: Not on file  Other Topics Concern   Not on file  Social History Narrative   Pt lives w/ ex wife   Right handed    Social Determinants of Health   Financial Resource Strain: Not on file  Food Insecurity: Not on file  Transportation Needs: Not on file  Physical Activity: Not on file  Stress: Not on file  Social Connections: Not on  file  Intimate Partner Violence: Not on file    Review of Systems  Constitutional:  Positive for fatigue.  Respiratory:  Positive for apnea.   Psychiatric/Behavioral:  Positive for sleep disturbance.     There were no vitals filed for this visit.    Physical Exam Constitutional:      Appearance: He  is obese.  HENT:     Nose: Nose normal.     Mouth/Throat:     Mouth: Mucous membranes are moist.     Comments: Mallampati 4, crowded oropharynx, macroglossia Eyes:     Pupils: Pupils are equal, round, and reactive to light.  Cardiovascular:     Rate and Rhythm: Normal rate and regular rhythm.     Heart sounds: No murmur heard.    No friction rub.  Pulmonary:     Effort: No respiratory distress.     Breath sounds: No stridor. No wheezing or rhonchi.  Musculoskeletal:     Cervical back: No rigidity or tenderness.  Neurological:     Mental Status: He is alert.  Psychiatric:        Mood and Affect: Mood normal.       01/19/2022   10:00 AM  Results of the Epworth flowsheet  Sitting and reading 2  Watching TV 2  Sitting, inactive in a public place (e.g. a theatre or a meeting) 2  As a passenger in a car for an hour without a break 1  Lying down to rest in the afternoon when circumstances permit 3  Sitting and talking to someone 1  Sitting quietly after a lunch without alcohol 2  In a car, while stopped for a few minutes in traffic 0  Total score 13   Data Reviewed: Previous sleep study is not available  Most recent sleep study reviewed showing moderate obstructive sleep apnea, titrated to CPAP of 13 Echocardiogram with ejection fraction of 30 to 35%, normal right ventricular pressures  Assessment:  History of obstructive sleep apnea -He will be set up for CPAP therapy  Obesity -Importance of weight loss -Regular exercise and diet  Excessive daytime sleepiness -Likely related to untreated sleep disordered breathing  Nonrestorative sleep with multiple  awakenings -Encouraged to increase dose of melatonin -Prescription strength sleep aid may be considered    Plan/Recommendations: Follow-up following initiation of CPAP therapy  Optimize sleep quality as able  Encourage weight loss efforts  I did discuss the risk of not treating sleep disordered breathing  Tentative follow-up in about 3 months  Sherrilyn Rist MD [facility] 04/21/2022, 10:13 AM  CC: [place], Blondell Reveal, MD

## 2022-04-22 ENCOUNTER — Encounter (HOSPITAL_BASED_OUTPATIENT_CLINIC_OR_DEPARTMENT_OTHER): Payer: Medicare Other

## 2022-04-29 ENCOUNTER — Encounter (HOSPITAL_COMMUNITY): Payer: Medicare Other

## 2022-05-01 ENCOUNTER — Other Ambulatory Visit: Payer: Self-pay | Admitting: Student

## 2022-05-08 ENCOUNTER — Other Ambulatory Visit: Payer: Self-pay | Admitting: Cardiology

## 2022-05-09 ENCOUNTER — Inpatient Hospital Stay: Payer: Medicare Other | Attending: Oncology

## 2022-05-09 ENCOUNTER — Telehealth: Payer: Self-pay

## 2022-05-09 DIAGNOSIS — D509 Iron deficiency anemia, unspecified: Secondary | ICD-10-CM | POA: Insufficient documentation

## 2022-05-09 DIAGNOSIS — E538 Deficiency of other specified B group vitamins: Secondary | ICD-10-CM | POA: Insufficient documentation

## 2022-05-09 LAB — FERRITIN: Ferritin: 79 ng/mL (ref 24–336)

## 2022-05-09 NOTE — Telephone Encounter (Signed)
-----   Message from [name], MD sent at 05/09/2022  1:54 PM EDT ----- Please call patient, ferritin level remains in goal range, should be scheduled for an office visit and ferritin in 4 months

## 2022-05-09 NOTE — Telephone Encounter (Signed)
TC to Pt results relayed to Pt's daughter who will relay message to Pt. Pt's daughter verbalized understanding.

## 2022-05-13 ENCOUNTER — Other Ambulatory Visit: Payer: Self-pay | Admitting: Nurse Practitioner

## 2022-05-17 ENCOUNTER — Other Ambulatory Visit: Payer: Self-pay | Admitting: Student

## 2022-05-18 NOTE — Progress Notes (Signed)
PCP: de [place], Raymond J, MD Cardiology: Dr. Agustin Cree HF Cardiology: Dr. Aundra Dubin  76 y.o. with history of chronic systolic CHF/nonischemic cardiomyopathy, hereditary hemochromatosis, and type 2 diabetes was referred by Dr. Agustin Cree for evaluation of CHF.  Patient is a compound heterozygote for [facility], followed by Dr. Benay Spice.  Last phlebotomy was in 11/20 and his ferritin level has been well-controlled for a long time. In 5/22, he began to develop significant exertional dyspnea.  He was admitted in 6/22, echo showed EF 30% with LV dilation and moderate LVH.  LHC showed no significant CAD.  Cardiac MRI showed EF 35% with extensive LGE in a mid-myocardial pattern in the basal septum and the basal to mid inferior wall. Findings on MRI were not consistent with hemochromatosis-related cardiomyopathy.  Patient was diuresed then started on GDMT for CHF.   Repeat echo in 9/22 showed EF still in the 30% range, patient had Medtronic BiV ICD placed.  Echo was done today and reviewed, EF 30-35%, moderate LV dilation, mild LVH, normal RV size and systolic function, IVC normal.   Today he returns for HF follow up with his grandaughter. Overall feeling fine. Denies SOB/PND/Orthopnea. Using CPAP every night. Appetite ok. No fever or chills. He has not been weighing at home. Taking all medications.  MDT device interrogation: Activity~ 2 hours. No VT No AF. Fluid index well below threshold.   Labs (10/22): K 4.4, creatinine 1.3 Labs (11/22): hgb 16.5, ferritin normal Labs (12/22): K 4.3, creatinine 1.08, BNP 158 Labs (12/17/21): K 4.5 Creatinine 1.12   PMH: 1. 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  Last phlebotomy 11/20.  Ferritin well-controlled x years.  2. LBBB 3. Chronic systolic CHF: Nonischemic cardiomyopathy.  Medtronic CRT-D device.  - Echo (6/22): EF 30-35%, moderate LVH, normal RV.  - LHC (6/22): No significant CAD. - Cardiac MRI (6/22): Moderate LV dilation, EF 35% with regional  WMAs, normal RV systolic function with EF 49%, mid-myocardial basal septal and basal-mid inferior LGE, T1 high (not suggestive of cardiac involvement by hemochromatosis).  - Echo (9/22): EF 30%, moderate LV dilation, moderate LVH, normal RV.  - Echo (2/23): EF 30-35%, moderate LV dilation, mild LVH, normal RV size and systolic function, IVC normal.  4. Hyperlipidemia 5. Type 2 diabetes 6. Gout 7. HTN 8. OSA: Not using CPAP  SH: From [place], lives in [place] with daughter.  Has another daughter in town as well.  Prior smoker, quit years ago.  Rare ETOH.   HF: Thinks one of his brothers had heart disease, not sure what.   ROS: All systems reviewed and negative except as per HPI.   Current Outpatient Medications  Medication Sig Dispense Refill   Alcohol Swabs (ALCOHOL PREP) PADS Clean injection site before each injection 100 each 0   allopurinol (ZYLOPRIM) 300 MG tablet TAKE 1 TABLET BY MOUTH  DAILY TO PREVENT GOUT 90 tablet 1   aspirin 81 MG tablet Take 81 mg by mouth daily.     atorvastatin (LIPITOR) 20 MG tablet TAKE 1 TABLET BY MOUTH  DAILY FOR CHOLESTEROL 90 tablet 3   bisoprolol (ZEBETA) 10 MG tablet Take 1 tablet (10 mg total) by mouth daily. 90 tablet 2   Cholecalciferol (VITAMIN D PO) Take 5,000 Units by mouth daily.     Cyanocobalamin (B-12 COMPLIANCE INJECTION) 1000 MCG/ML KIT 1000 mcg daily for 1 week, then weekly for 1 month, then monthly for a year 1 kit 0   D-Mannose 500 MG CAPS Take 1,500 mg by mouth daily.  dapagliflozin propanediol (FARXIGA) 10 MG TABS tablet Take 1 tablet (10 mg total) by mouth daily. 100 tablet 2   ENTRESTO 97-103 MG TAKE 1 TABLET BY MOUTH TWICE  DAILY 180 tablet 3   famotidine (PEPCID) 40 MG tablet Take 40 mg by mouth as needed.     glipiZIDE (GLUCOTROL) 10 MG tablet TAKE 1 TABLET BY MOUTH 3  TIMES DAILY WITH MEALS FOR  DIABETES 270 tablet 3   insulin isophane & regular human (HUMULIN 70/30 MIX) (70-30) 100 UNIT/ML KwikPen Inject into subcutaneous  skin 17mn before meals, 45units breakfast, 40units lunch and 22units dinner. 135 mL 4   Loratadine (CLARITIN PO) Take 1 tablet by mouth as needed. Unknown strength     magnesium oxide (MAG-OX) 400 MG tablet Take 400 mg by mouth daily.     Melatonin 10 MG CAPS Take 10 mg by mouth at bedtime as needed (sleep).     metFORMIN (GLUCOPHAGE-XR) 500 MG 24 hr tablet TAKE 2 TABLETS BY MOUTH  TWICE DAILY WITH MEALS FOR  DIABETES 360 tablet 3   sharps container 1 each by Does not apply route as needed. 1 each 0   spironolactone (ALDACTONE) 25 MG tablet Take 1 tablet (25 mg total) by mouth daily. 90 tablet 2   SYRINGE-NEEDLE, DISP, 3 ML 23G X 1" 3 ML MISC Use new syringe and needle for each injection 50 each 0   tamsulosin (FLOMAX) 0.4 MG CAPS capsule TAKE 1 CAPSULE BY MOUTH AT  BEDTIME FOR PROSTATE 90 capsule 3   furosemide (LASIX) 20 MG tablet Take 1 tablet (20 mg total) by mouth daily. TAKE 1 TABLET BY MOUTH  DAILY FOR BP, / FLUID  RETENTION / ANKLE SWELLING (Patient not taking: Reported on 05/19/2022) 90 tablet 3   No current facility-administered medications for this encounter.    BP 136/78   Pulse 65   Wt 92.7 kg (204 lb 6.4 oz)   SpO2 95%   BMI 34.01 kg/m   Wt Readings from Last 3 Encounters:  05/19/22 92.7 kg (204 lb 6.4 oz)  04/21/22 93 kg (205 lb)  03/22/22 94.3 kg (208 lb)    General:  Walked in the clinic. Well appearing. No resp difficulty HEENT: normal Neck: supple. no JVD. Carotids 2+ bilat; no bruits. No lymphadenopathy or thryomegaly appreciated. Cor: PMI nondisplaced. Regular rate & rhythm. No rubs, gallops or murmurs. Lungs: clear Abdomen: soft, nontender, nondistended. No hepatosplenomegaly. No bruits or masses. Good bowel sounds. Extremities: no cyanosis, clubbing, rash, edema Neuro: alert & orientedx3, cranial nerves grossly intact. moves all 4 extremities w/o difficulty. Affect pleasant  Assessment/Plan:  1. Chronic systolic CHF: Nonischemic cardiomyopathy.  MDT CRT-D  device.  Cardiac MRI in 6/22 showed EF 35% with extensive LGE in a mid-myocardial pattern in the basal septum and the basal to mid inferior wall. Findings on MRI were not consistent with hemochromatosis-related cardiomyopathy. Echo in 9/22 showed EF 30-35%, moderate LVH, normal RV.  Cath in 6/22 showed no significant CAD.  Cause of cardiomyopathy is uncertain.  There is extensive LGE as above with moderate LVH. Not classic pattern for hypertrophic cardiomyopathy but hard to rule out.  Cardiac sarcoidosis or amyloidosis would be considerations.  Prior viral myocarditis is also a definite possibility. Echo 11/2021  EF 30-35%, moderate LV dilation, mild LVH, normal RV size and systolic function, IVC normal.   Medtronic Optivol reviewed. Fluid index well below threshold. Discussed.  NYHA II. Volume status stable. Continue lasix as needed.  - Continue bisoprolol 10  mg daily.  - Continue dapafligliflozin 10 daily.  - Continue spironolactone 25 mg daily.  - Continue Entresto 97/103 bid.   - Check BMET  2. OSA: Using CPAP every night.  3. HTN: Stable. Continue current regimen. 4. Hereditary hemochromatosis: Compound heterozygote.  Followed by hematology. No phlebotomy since 11/20.  Ferritin has been well-controlled.  Cardiac MRI did not look like cardiac hemochromatosis, would probably not expect with good disease control.   Asked SW to give his family a Hydrologist Activities for [facility].   Follow up in 3-4 months with Dr Precious Bard NP-C  05/19/2022

## 2022-05-19 ENCOUNTER — Ambulatory Visit (HOSPITAL_COMMUNITY)
Admission: RE | Admit: 2022-05-19 | Discharge: 2022-05-19 | Disposition: A | Discharge status: Home / Self Care | Payer: Medicare Other | Source: Ambulatory Visit | Attending: Adult Health | Admitting: Adult Health

## 2022-05-19 ENCOUNTER — Encounter (HOSPITAL_COMMUNITY): Payer: Self-pay

## 2022-05-19 VITALS — BP 136/78 | HR 65 | Wt 204.4 lb

## 2022-05-19 DIAGNOSIS — I428 Other cardiomyopathies: Secondary | ICD-10-CM | POA: Insufficient documentation

## 2022-05-19 DIAGNOSIS — G4733 Obstructive sleep apnea (adult) (pediatric): Secondary | ICD-10-CM

## 2022-05-19 DIAGNOSIS — E119 Type 2 diabetes mellitus without complications: Secondary | ICD-10-CM | POA: Insufficient documentation

## 2022-05-19 DIAGNOSIS — I5020 Unspecified systolic (congestive) heart failure: Secondary | ICD-10-CM

## 2022-05-19 DIAGNOSIS — Z79899 Other long term (current) drug therapy: Secondary | ICD-10-CM | POA: Diagnosis not present

## 2022-05-19 DIAGNOSIS — I5022 Chronic systolic (congestive) heart failure: Secondary | ICD-10-CM

## 2022-05-19 DIAGNOSIS — I158 Other secondary hypertension: Secondary | ICD-10-CM

## 2022-05-19 DIAGNOSIS — I110 Hypertensive heart disease with heart failure: Secondary | ICD-10-CM | POA: Diagnosis not present

## 2022-05-19 LAB — BASIC METABOLIC PANEL
Anion gap: 9 (ref 5–15)
BUN: 22 mg/dL (ref 8–23)
CO2: 24 mmol/L (ref 22–32)
Calcium: 9.2 mg/dL (ref 8.9–10.3)
Chloride: 105 mmol/L (ref 98–111)
Creatinine, Ser: 1.27 mg/dL — ABNORMAL HIGH (ref 0.61–1.24)
GFR, Estimated: 59 mL/min — ABNORMAL LOW (ref >60)
Glucose, Bld: 230 mg/dL — ABNORMAL HIGH (ref 70–99)
Potassium: 4.4 mmol/L (ref 3.5–5.1)
Sodium: 138 mmol/L (ref 135–145)

## 2022-05-19 MED ORDER — FUROSEMIDE 20 MG PO TABS: 20.0000 mg | ORAL_TABLET | Freq: Every day | ORAL | 3 refills | Status: DC

## 2022-05-19 NOTE — Progress Notes (Signed)
H&V Care Navigation CSW Progress Note  Clinical Social Worker met with patient to discuss desire to get involved in social community.   Pt has lived her for 17 years but is not involved in any community agencies- is interested in making friends.  CSW provided with [employer] calender of events for him to consider.  Transportation is sometimes an issue due to family working during the day.  Provided with [employer] application and list of transportation resources.  SDOH Screenings   Alcohol Screen: Not on file  Depression (PHQ2-9): Medium Risk (12/15/2021)   Depression (PHQ2-9)    PHQ-2 Score: 18  Financial Resource Strain: Not on file  Food Insecurity: Not on file  Housing: Not on file  Physical Activity: Not on file  Social Connections: Not on file  Stress: Not on file  Tobacco Use: Medium Risk (05/19/2022)   Patient History    Smoking Tobacco Use: Former    Smokeless Tobacco Use: Never    Passive Exposure: Past  Transportation Needs: No Transportation Needs (05/19/2022)   PRAPARE - Hydrologist (Medical): No    Lack of Transportation (Non-Medical): No    Jorge Ny, LCSW Clinical Social Worker [facility] Desk#: [phone] Cell#: [phone]

## 2022-05-19 NOTE — Patient Instructions (Signed)
Labs done today. We will contact you only if your labs are abnormal.  No medication changes were made. Please continue all current medications as prescribed.  Your physician recommends that you schedule a follow-up appointment in: 3 months with Dr. Aundra Dubin. Please contact our office in September to schedule a October appointment.   If you have any questions or concerns before your next appointment please send [place] a message through [place] or call our office at [phone].    TO LEAVE A MESSAGE FOR THE NURSE SELECT OPTION 2, PLEASE LEAVE A MESSAGE INCLUDING: YOUR NAME DATE OF BIRTH CALL BACK NUMBER REASON FOR CALL**this is important as we prioritize the call backs  YOU WILL RECEIVE A CALL BACK THE SAME DAY AS LONG AS YOU CALL BEFORE 4:00 PM   Do the following things EVERYDAY: Weigh yourself in the morning before breakfast. Write it down and keep it in a log. Take your medicines as prescribed Eat low salt foods--Limit salt (sodium) to 2000 mg per day.  Stay as active as you can everyday Limit all fluids for the day to less than 2 liters   At [facility], you and your health needs are our priority. As part of our continuing mission to provide you with exceptional heart care, we have created designated Provider Care Teams. These Care Teams include your primary Cardiologist (physician) and Advanced Practice Providers (APPs- Physician Assistants and Nurse Practitioners) who all work together to provide you with the care you need, when you need it.   You may see any of the following providers on your designated Care Team at your next follow up: Dr Glori Bickers Dr Haynes Kerns, NP Lyda Jester, [place] Audry Riles, PharmD   Please be sure to bring in all your medications bottles to every appointment.

## 2022-05-19 NOTE — Addendum Note (Signed)
Encounter addended by: Jorge Ny, LCSW on: 05/19/2022 4:05 PM  Actions taken: Flowsheet accepted, Clinical Note Signed

## 2022-05-26 ENCOUNTER — Ambulatory Visit (INDEPENDENT_AMBULATORY_CARE_PROVIDER_SITE_OTHER): Payer: Medicare Other | Admitting: Family Medicine

## 2022-05-26 ENCOUNTER — Encounter (HOSPITAL_BASED_OUTPATIENT_CLINIC_OR_DEPARTMENT_OTHER): Payer: Self-pay | Admitting: Family Medicine

## 2022-05-26 DIAGNOSIS — Z794 Long term (current) use of insulin: Secondary | ICD-10-CM | POA: Diagnosis not present

## 2022-05-26 DIAGNOSIS — E1122 Type 2 diabetes mellitus with diabetic chronic kidney disease: Secondary | ICD-10-CM

## 2022-05-26 DIAGNOSIS — N182 Chronic kidney disease, stage 2 (mild): Secondary | ICD-10-CM

## 2022-05-26 DIAGNOSIS — E1165 Type 2 diabetes mellitus with hyperglycemia: Secondary | ICD-10-CM

## 2022-05-26 MED ORDER — TOUJEO MAX SOLOSTAR 300 UNIT/ML ~~LOC~~ SOPN: 80.0000 [IU] | PEN_INJECTOR | Freq: Every day | SUBCUTANEOUS | 1 refills | Status: DC

## 2022-05-26 NOTE — Patient Instructions (Signed)
  Medication Instructions:  Your physician recommends that you continue on your current medications as directed. Please refer to the Current Medication list given to you today. --If you need a refill on any your medications before your next appointment, please call your pharmacy first. If no refills are authorized on file call the office.-- Lab Work: Your physician has recommended that you have lab work today: No If you have labs (blood work) drawn today and your tests are completely normal, you will receive your results via MyChart message OR a phone call from our staff.  Please ensure you check your voicemail in the event that you authorized detailed messages to be left on a delegated number. If you have any lab test that is abnormal or we need to change your treatment, we will call you to review the results.  Referrals/Procedures/Imaging: No  Follow-Up: Your next appointment:   Your physician recommends that you schedule a follow-up appointment in: 3-4 weeks with Dr. de [place].  You will receive a text message or e-mail with a link to a survey about your care and experience with us today! We would greatly appreciate your feedback!   Thanks for letting us be apart of your health journey!!  Primary Care and Sports Medicine   Dr. Raymond de [place]   We encourage you to activate your patient portal called "MyChart".  Sign up information is provided on this After Visit Summary.  MyChart is used to connect with patients for Virtual Visits (Telemedicine).  Patients are able to view lab/test results, encounter notes, upcoming appointments, etc.  Non-urgent messages can be sent to your provider as well. To learn more about what you can do with MyChart, please visit --  [email or web address].    

## 2022-05-26 NOTE — Progress Notes (Signed)
    Procedures performed today:    None.  Independent interpretation of notes and tests performed by another provider:   None.  Brief History, Exam, Impression, and Recommendations:    BP 137/75   Pulse 60   Ht '5\' 5"'$  (1.651 m)   Wt 201 lb 6.4 oz (91.4 kg)   BMI 33.51 kg/m   Type 2 diabetes mellitus with stage 2 chronic kidney disease, with long-term current use of insulin ([place]) Patient is accompanied by daughter today.  Primary concerns are related to management of diabetes and in particular insulin management.  Patient currently has been utilizing 70/30 insulin with 3 times daily administration.  Each of the administrations does have a different dose, currently instructed to be using 45 units, 40 units, 22 units with each of breakfast, lunch and dinner, respectively.  Daughter indicates that she has seen the patient drying up higher volumes than instructed, primarily with dinnertime dose as he has generally been doing about the same number of units with each meal.  In discussing, patient has been on 70/30 regimen for many years, daughter is not aware of patient ever having been on a basal insulin in the past.  With current regimen, primary issues have been in the evening/at night after or around the time of his dinner time insulin. We had a long discussion today regarding insulin management and consideration of further adjusting current 70/30 insulin mixture or possible transition to once daily basal insulin.  Discussed that once daily basal insulin may not do as well in regards to tight control of insulin numbers, particularly postprandial blood sugars.  Also discussed that as patients get older, we are more tolerant of allowing for slightly higher hemoglobin A1c readings in order to lower hypoglycemia risk. After this discussion, decision was made to transition patient from 70/30 insulin to basal insulin.  Additionally on chart review, patient did have glipizide as an active medication.  Did  discuss that oftentimes use of sulfonylurea with insulin is generally not beneficial and thus would also have patient discontinue glipizide. Prescription sent to pharmacy for insulin glargine.  With 70/30 insulin regimen, patient was administering just over 100 units daily.  We will reduce daily dose by about 20% while transitioning to basal insulin to try to lower risk of hypoglycemia.  Advised on regular monitoring of fasting blood sugar first thing in the morning.  Target fasting blood sugar is between 80-130.  If average readings remain above this range every 3 days, would look to gradually titrate dose by 2 units until fasting blood sugar is within this range.  If having issues with low blood sugar readings, recommend reducing insulin dose and contacting the office. We will plan for close follow-up in about 3 to 4 weeks to monitor progress, or sooner as needed Plan check hemoglobin A1c at next visit  Return in about 3 weeks (around 06/16/2022) for DM.   ___________________________________________ Brett Kichline de [place], MD, ABFM, CAQ[facility] Primary Care and [place]

## 2022-05-26 NOTE — Assessment & Plan Note (Signed)
Patient is accompanied by daughter today.  Primary concerns are related to management of diabetes and in particular insulin management.  Patient currently has been utilizing 70/30 insulin with 3 times daily administration.  Each of the administrations does have a different dose, currently instructed to be using 45 units, 40 units, 22 units with each of breakfast, lunch and dinner, respectively.  Daughter indicates that she has seen the patient drying up higher volumes than instructed, primarily with dinnertime dose as he has generally been doing about the same number of units with each meal.  In discussing, patient has been on 70/30 regimen for many years, daughter is not aware of patient ever having been on a basal insulin in the past.  With current regimen, primary issues have been in the evening/at night after or around the time of his dinner time insulin. We had a long discussion today regarding insulin management and consideration of further adjusting current 70/30 insulin mixture or possible transition to once daily basal insulin.  Discussed that once daily basal insulin may not do as well in regards to tight control of insulin numbers, particularly postprandial blood sugars.  Also discussed that as patients get older, we are more tolerant of allowing for slightly higher hemoglobin A1c readings in order to lower hypoglycemia risk. After this discussion, decision was made to transition patient from 70/30 insulin to basal insulin.  Additionally on chart review, patient did have glipizide as an active medication.  Did discuss that oftentimes use of sulfonylurea with insulin is generally not beneficial and thus would also have patient discontinue glipizide. Prescription sent to pharmacy for insulin glargine.  With 70/30 insulin regimen, patient was administering just over 100 units daily.  We will reduce daily dose by about 20% while transitioning to basal insulin to try to lower risk of hypoglycemia.  Advised  on regular monitoring of fasting blood sugar first thing in the morning.  Target fasting blood sugar is between 80-130.  If average readings remain above this range every 3 days, would look to gradually titrate dose by 2 units until fasting blood sugar is within this range.  If having issues with low blood sugar readings, recommend reducing insulin dose and contacting the office. We will plan for close follow-up in about 3 to 4 weeks to monitor progress, or sooner as needed Plan check hemoglobin A1c at next visit

## 2022-06-13 ENCOUNTER — Ambulatory Visit (INDEPENDENT_AMBULATORY_CARE_PROVIDER_SITE_OTHER): Payer: Medicare Other

## 2022-06-13 DIAGNOSIS — I428 Other cardiomyopathies: Secondary | ICD-10-CM

## 2022-06-13 DIAGNOSIS — I5022 Chronic systolic (congestive) heart failure: Secondary | ICD-10-CM

## 2022-06-14 LAB — CUP PACEART REMOTE DEVICE CHECK
Battery Remaining Longevity: 100 mo
Battery Voltage: 3.01 V
Brady Statistic AP VP Percent: 49.35 %
Brady Statistic AP VS Percent: 0.9 %
Brady Statistic AS VP Percent: 48.59 %
Brady Statistic AS VS Percent: 1.16 %
Brady Statistic RA Percent Paced: 45.76 %
Brady Statistic RV Percent Paced: 28.14 %
Date Time Interrogation Session: 20230821001602
HighPow Impedance: 65 Ohm
Implantable Lead Implant Date: 20221121
Implantable Lead Implant Date: 20221121
Implantable Lead Implant Date: 20221121
Implantable Lead Location: 753858
Implantable Lead Location: 753859
Implantable Lead Location: 753860
Implantable Lead Model: 4598
Implantable Lead Model: 5076
Implantable Pulse Generator Implant Date: 20221121
Lead Channel Impedance Value: 145.871
Lead Channel Impedance Value: 145.871
Lead Channel Impedance Value: 149.625
Lead Channel Impedance Value: 166.114
Lead Channel Impedance Value: 166.114
Lead Channel Impedance Value: 266 Ohm
Lead Channel Impedance Value: 323 Ohm
Lead Channel Impedance Value: 323 Ohm
Lead Channel Impedance Value: 323 Ohm
Lead Channel Impedance Value: 342 Ohm
Lead Channel Impedance Value: 342 Ohm
Lead Channel Impedance Value: 437 Ohm
Lead Channel Impedance Value: 437 Ohm
Lead Channel Impedance Value: 494 Ohm
Lead Channel Impedance Value: 494 Ohm
Lead Channel Impedance Value: 532 Ohm
Lead Channel Impedance Value: 570 Ohm
Lead Channel Impedance Value: 589 Ohm
Lead Channel Pacing Threshold Amplitude: 0.875 V
Lead Channel Pacing Threshold Amplitude: 0.875 V
Lead Channel Pacing Threshold Amplitude: 1 V
Lead Channel Pacing Threshold Pulse Width: 0.4 ms
Lead Channel Pacing Threshold Pulse Width: 0.4 ms
Lead Channel Pacing Threshold Pulse Width: 0.4 ms
Lead Channel Sensing Intrinsic Amplitude: 18.75 mV
Lead Channel Sensing Intrinsic Amplitude: 18.75 mV
Lead Channel Sensing Intrinsic Amplitude: 2.125 mV
Lead Channel Sensing Intrinsic Amplitude: 2.125 mV
Lead Channel Setting Pacing Amplitude: 1.5 V
Lead Channel Setting Pacing Amplitude: 1.5 V
Lead Channel Setting Pacing Amplitude: 2 V
Lead Channel Setting Pacing Pulse Width: 0.4 ms
Lead Channel Setting Pacing Pulse Width: 0.4 ms
Lead Channel Setting Sensing Sensitivity: 0.3 mV

## 2022-06-16 ENCOUNTER — Encounter (HOSPITAL_BASED_OUTPATIENT_CLINIC_OR_DEPARTMENT_OTHER): Payer: Self-pay | Admitting: Family Medicine

## 2022-06-16 ENCOUNTER — Ambulatory Visit (INDEPENDENT_AMBULATORY_CARE_PROVIDER_SITE_OTHER): Payer: Medicare Other | Admitting: Family Medicine

## 2022-06-16 VITALS — BP 93/66 | HR 62 | Ht 65.0 in | Wt 201.4 lb

## 2022-06-16 DIAGNOSIS — Z794 Long term (current) use of insulin: Secondary | ICD-10-CM | POA: Diagnosis not present

## 2022-06-16 DIAGNOSIS — N182 Chronic kidney disease, stage 2 (mild): Secondary | ICD-10-CM

## 2022-06-16 DIAGNOSIS — E1122 Type 2 diabetes mellitus with diabetic chronic kidney disease: Secondary | ICD-10-CM

## 2022-06-16 DIAGNOSIS — Z23 Encounter for immunization: Secondary | ICD-10-CM

## 2022-06-16 LAB — HEMOGLOBIN A1C
Est. average glucose Bld gHb Est-mCnc: 157 mg/dL
Hgb A1c MFr Bld: 7.1 % — ABNORMAL HIGH (ref 4.8–5.6)

## 2022-06-16 NOTE — Patient Instructions (Signed)
  Medication Instructions:  Your physician recommends that you continue on your current medications as directed. Please refer to the Current Medication list given to you today. --If you need a refill on any your medications before your next appointment, please call your pharmacy first. If no refills are authorized on file call the office.-- Lab Work: Your physician has recommended that you have lab work today: Yes If you have labs (blood work) drawn today and your tests are completely normal, you will receive your results via [place] a phone call from our staff.  Please ensure you check your voicemail in the event that you authorized detailed messages to be left on a delegated number. If you have any lab test that is abnormal or we need to change your treatment, we will call you to review the results.  Referrals/Procedures/Imaging: No  Follow-Up: Your next appointment:   Your physician recommends that you schedule a follow-up appointment in: 6 weeks with Dr. de [place].  You will receive a text message or e-mail with a link to a survey about your care and experience with [place] today! We would greatly appreciate your feedback!   Thanks for letting us be apart of your health journey!!  Primary Care and Sports Medicine   Dr. Arlina Robes [place]   We encourage you to activate your patient portal called "MyChart".  Sign up information is provided on this After Visit Summary.  MyChart is used to connect with patients for Virtual Visits (Telemedicine).  Patients are able to view lab/test results, encounter notes, upcoming appointments, etc.  Non-urgent messages can be sent to your provider as well. To learn more about what you can do with MyChart, please visit --  [email or web address].

## 2022-06-16 NOTE — Assessment & Plan Note (Signed)
Patient and daughter report that he has been doing well with transition to basal insulin.  He has been checking his fasting blood sugar most mornings since last visit, readings have ranged from 90s up to 300.  It seems that for the higher readings, he has had some dietary indiscretions such as going to the grocery store and getting donuts.  There have not been any concerns regarding symptoms of hypoglycemia.  Daughter feels that this regimen has been much easier for him to understand/follow.  She also reports that he made mention that he has not been having as much dizziness which he had not told her for [place] about previously. Feel that simplifying insulin regimen for patient has been beneficial and has decreased risk of hypoglycemic episodes.  We will need to monitor for any notably high blood sugar readings as these may also be problematic. Has not had recent hemoglobin A1c checked, will complete today Is due for pneumococcal vaccination, administered today He does see an eye doctor about every 6 months, stays up-to-date with this.  He sees Dr. Tempie Hoist with [facility]

## 2022-06-16 NOTE — Progress Notes (Signed)
    Procedures performed today:    None.  Independent interpretation of notes and tests performed by another provider:   None.  Brief History, Exam, Impression, and Recommendations:    BP 93/66   Pulse 62   Ht '5\' 5"'$  (1.651 m)   Wt 201 lb 6.4 oz (91.4 kg)   SpO2 96%   BMI 33.51 kg/m   Type 2 diabetes mellitus with stage 2 chronic kidney disease, with long-term current use of insulin [facility]) Patient and daughter report that he has been doing well with transition to basal insulin.  He has been checking his fasting blood sugar most mornings since last visit, readings have ranged from 90s up to 300.  It seems that for the higher readings, he has had some dietary indiscretions such as going to the grocery store and getting donuts.  There have not been any concerns regarding symptoms of hypoglycemia.  Daughter feels that this regimen has been much easier for him to understand/follow.  She also reports that he made mention that he has not been having as much dizziness which he had not told her for [place] about previously. Feel that simplifying insulin regimen for patient has been beneficial and has decreased risk of hypoglycemic episodes.  We will need to monitor for any notably high blood sugar readings as these may also be problematic. Has not had recent hemoglobin A1c checked, will complete today Is due for pneumococcal vaccination, administered today He does see an eye doctor about every 6 months, stays up-to-date with this.  He sees Dr. Tempie Hoist with [facility]  Discussed recommendation for seasonal influenza vaccine, we do not currently have the high-dose flu vaccine available given his age.  Discussed that he have this done at local pharmacy or we can look to do so at next office visit if we do have available at that time  No follow-ups on file.   ___________________________________________ Celeste Candelas de [place], MD, ABFM, [facility] Primary Care and [place]

## 2022-06-21 ENCOUNTER — Other Ambulatory Visit: Payer: Self-pay | Admitting: Nurse Practitioner

## 2022-06-21 ENCOUNTER — Other Ambulatory Visit: Payer: Self-pay | Admitting: Cardiology

## 2022-06-22 ENCOUNTER — Other Ambulatory Visit: Payer: Self-pay | Admitting: Cardiology

## 2022-06-22 NOTE — Telephone Encounter (Signed)
Rx refill sent to pharmacy. 

## 2022-06-23 ENCOUNTER — Ambulatory Visit (INDEPENDENT_AMBULATORY_CARE_PROVIDER_SITE_OTHER): Payer: Medicare Other | Admitting: Neurology

## 2022-06-23 ENCOUNTER — Encounter: Payer: Self-pay | Admitting: Neurology

## 2022-06-23 VITALS — BP 106/64 | HR 54 | Ht 65.0 in | Wt 200.4 lb

## 2022-06-23 DIAGNOSIS — G309 Alzheimer's disease, unspecified: Secondary | ICD-10-CM

## 2022-06-23 DIAGNOSIS — F0280 Dementia in other diseases classified elsewhere without behavioral disturbance: Secondary | ICD-10-CM

## 2022-06-23 MED ORDER — MEMANTINE HCL 10 MG PO TABS: ORAL_TABLET | ORAL | 11 refills | Status: DC

## 2022-06-23 NOTE — Patient Instructions (Signed)
Good to see you.  Recommend starting Memantine '10mg'$ : take 1 tablet every night for 2 weeks, then increase to 1 tablet twice a day  2. Continue to monitor driving  3. Recommend looking into joining day programs or exercise programs  4. Follow-up in 6 months with Memory Disorders PA Sharene Butters, call for any changes   FALL PRECAUTIONS: Be cautious when walking. Scan the area for obstacles that may increase the risk of trips and falls. When getting up in the mornings, sit up at the edge of the bed for a few minutes before getting out of bed. Consider elevating the bed at the head end to avoid drop of blood pressure when getting up. Walk always in a well-lit room (use night lights in the walls). Avoid area rugs or power cords from appliances in the middle of the walkways. Use a walker or a cane if necessary and consider physical therapy for balance exercise. Get your eyesight checked regularly.  FINANCIAL OVERSIGHT: Supervision, especially oversight when making financial decisions or transactions is also recommended.  HOME SAFETY: Consider the safety of the kitchen when operating appliances like stoves, microwave oven, and blender. Consider having supervision and share cooking responsibilities until no longer able to participate in those. Accidents with firearms and other hazards in the house should be identified and addressed as well.  DRIVING: Regarding driving, in patients with progressive memory problems, driving will be impaired. We advise to have someone else do the driving if trouble finding directions or if minor accidents are reported. Independent driving assessment is available to determine safety of driving.  ABILITY TO BE LEFT ALONE: If patient is unable to contact 911 operator, consider using LifeLine, or when the need is there, arrange for someone to stay with patients. Smoking is a fire hazard, consider supervision or cessation. Risk of wandering should be assessed by caregiver and if  detected at any point, supervision and safe proof recommendations should be instituted.  MEDICATION SUPERVISION: Inability to self-administer medication needs to be constantly addressed. Implement a mechanism to ensure safe administration of the medications.  RECOMMENDATIONS FOR ALL PATIENTS WITH MEMORY PROBLEMS: 1. Continue to exercise (Recommend 30 minutes of walking everyday, or 3 hours every week) 2. Increase social interactions - continue going to [place] and enjoy social gatherings with friends and family 3. Eat healthy, avoid fried foods and eat more fruits and vegetables 4. Maintain adequate blood pressure, blood sugar, and blood cholesterol level. Reducing the risk of stroke and cardiovascular disease also helps promoting better memory. 5. Avoid stressful situations. Live a simple life and avoid aggravations. Organize your time and prepare for the next day in anticipation. 6. Sleep well, avoid any interruptions of sleep and avoid any distractions in the bedroom that may interfere with adequate sleep quality 7. Avoid sugar, avoid sweets as there is a strong link between excessive sugar intake, diabetes, and cognitive impairment The Mediterranean diet has been shown to help patients reduce the risk of progressive memory disorders and reduces cardiovascular risk. This includes eating fish, eat fruits and green leafy vegetables, nuts like almonds and hazelnuts, walnuts, and also use olive oil. Avoid fast foods and fried foods as much as possible. Avoid sweets and sugar as sugar use has been linked to worsening of memory function.  There is always a concern of gradual progression of memory problems. If this is the case, then we may need to adjust level of care according to patient needs. Support, both to the patient and caregiver, should  then be put into place.

## 2022-06-23 NOTE — Progress Notes (Signed)
NEUROLOGY FOLLOW UP OFFICE NOTE  NEMESIO CASTRILLON [id number] 07-Oct-1946  HISTORY OF PRESENT ILLNESS: I had the pleasure of seeing Keveon Ohlson in follow-up in the neurology clinic on 06/23/2022.  The patient was last seen 6 months ago for memory loss. He is again accompanied by his daughter [name] who helps supplement the history today.  Records and images were personally reviewed where available.  MoCA score 12/30 in 11/2021. I personally reviewed MRI brain without contrast done 02/2022 which did not show any acute changes. There was moderate to severely advanced cerebral atrophy with moderate chronic microvascular disease. His B12 was low at 208 and he was started on B12 injections. He had a sleep study in 01/2022 showing moderate OSA and started using a CPAP machine. [name] has not noticed any change in memory with B12 replacement or CPAP initiation. She notes worsening of short-term memory. He still drives short distances occasionally and denies getting lost. [name] fills his pillbox, he is good with taking it most of the time, forgetting around once a week. His insulin regimen is better. [name] manages all finances. He does not want to bathe most of the time. He is independent with dressing. He is alone during the day with their 3 dogs. His daughter reports concern with the money he is spending, he is convinced he can get his camper to pull his boat despite mechanics saying it will not work. He states he is stubborn. No paranoia or hallucinations.   Lab Results  Component Value Date   HGBA1C 7.1 (H) 06/16/2022     History on Initial Assessment 12/21/2021: This is a pleasant 76 year old right-handed man with a history of hypertension, hyperlipidemia, MD, gout, CHF, s/p PPM, presenting for evaluation of memory loss. He feels his memory is "so-so, not great but not that bad." His daughter [name] is present to provide additional history. [name] moved from [place] last August 2022 to help him after his  cardiac issues last year. Since she moved in, she started noticing progressive memory changes. There is a lot of repetitive questions, same conversations. He gets the days confused and would not be able to keep up with his doctors appointments. He was previously managing his own medications and denies missing doses, [name] started to help in August, but recently started putting his medications in a pillbox because he was getting confused with the bottles. He remembers to take them but she has to remind him sometimes. His last HbA1c was 7.2. He was managing finances without difficulties, she now writes the checks and he signs them. He does not cook. He got lost driving around 3-4 months ago, he was 30 minutes away from home but was driving around for 3 hours and the police pulled him over. Glucose level at that time was 66. He has been driving very little since then. He denies misplacing things but loses the computer mouse or would disable the camera and [name] has to figure out what he had pressed when he says it is not his fault. He is mostly on his laptop during the day watching "junk." He would refuse to go outside with family or walk around, giving his pacemaker/cardiac issues as a reason even if he has been cleared for exercise by his cardiologist. He is able to dress himself. He does not bathe regularly, only when he has a doctors appointment. There is no paranoia or hallucinations, but he gets easily irritated when [name] tries to get him to do something. He  reports sleep is good, but Diana shakes her head. He fell a week ago, he got lost in the house to use the bathroom, and fell down 6 steps. He denies any headaches, dizziness, diplopia, dysarthria/dysphagia, neck/back pain. He has occasional left hand numbness and tingling. He has bowel and bladder incontinence and wears Depends, [name] also feels this causes him to isolate himself. He reports mood is good. [name] reports hearing loss, as well as decreased  ability to smell and taste. He has mild hand tremors that do not affect daily activities. He is currently living with [name] and her boyfriend. His other daughter also lives in [place]. There is no clear family history of dementia, no significant head injuries. He stopped drinking alcohol since his heart issues started.   Laboratory Data: Lab Results  Component Value Date   TSH 2.48 12/21/2021   Lab Results  Component Value Date   VITAMINB12 208 (L) 12/21/2021     PAST MEDICAL HISTORY: Past Medical History:  Diagnosis Date   Acid reflux    Allergy    Colon polyp    Diabetes mellitus without complication ([place])    Dilated cardiomyopathy ([place])    non isch   Gout    Gout    Hemochromatosis    Hypertension    OSA (obstructive sleep apnea)    Urine frequency     MEDICATIONS: Current Outpatient Medications on File Prior to Visit  Medication Sig Dispense Refill   Alcohol Swabs (ALCOHOL PREP) PADS Clean injection site before each injection 100 each 0   allopurinol (ZYLOPRIM) 300 MG tablet TAKE 1 TABLET BY MOUTH  DAILY TO PREVENT GOUT 90 tablet 1   aspirin 81 MG tablet Take 81 mg by mouth daily.     atorvastatin (LIPITOR) 20 MG tablet TAKE 1 TABLET BY MOUTH  DAILY FOR CHOLESTEROL 90 tablet 3   bisoprolol (ZEBETA) 10 MG tablet Take 1 tablet (10 mg total) by mouth daily. 90 tablet 2   Cholecalciferol (VITAMIN D PO) Take 5,000 Units by mouth daily.     Cyanocobalamin (B-12 COMPLIANCE INJECTION) 1000 MCG/ML KIT 1000 mcg daily for 1 week, then weekly for 1 month, then monthly for a year 1 kit 0   D-Mannose 500 MG CAPS Take 1,500 mg by mouth daily.     dapagliflozin propanediol (FARXIGA) 10 MG TABS tablet Take 1 tablet (10 mg total) by mouth daily. 100 tablet 2   ENTRESTO 97-103 MG TAKE 1 TABLET BY MOUTH TWICE  DAILY 180 tablet 3   famotidine (PEPCID) 40 MG tablet Take 40 mg by mouth as needed.     furosemide (LASIX) 20 MG tablet Take 1 tablet (20 mg total) by mouth daily. 90 tablet 3    insulin glargine, 2 Unit Dial, (TOUJEO MAX SOLOSTAR) 300 UNIT/ML Solostar Pen Inject 80 Units into the skin daily. 15 mL 1   Loratadine (CLARITIN PO) Take 1 tablet by mouth as needed. Unknown strength     magnesium oxide (MAG-OX) 400 MG tablet Take 400 mg by mouth daily.     Melatonin 10 MG CAPS Take 10 mg by mouth at bedtime as needed (sleep).     metFORMIN (GLUCOPHAGE-XR) 500 MG 24 hr tablet TAKE 2 TABLETS BY MOUTH  TWICE DAILY WITH MEALS FOR  DIABETES 360 tablet 3   sharps container 1 each by Does not apply route as needed. 1 each 0   spironolactone (ALDACTONE) 25 MG tablet TAKE 1 TABLET BY MOUTH DAILY 100 tablet 0  SYRINGE-NEEDLE, DISP, 3 ML 23G X 1" 3 ML MISC Use new syringe and needle for each injection 50 each 0   tamsulosin (FLOMAX) 0.4 MG CAPS capsule TAKE 1 CAPSULE BY MOUTH AT  BEDTIME FOR PROSTATE 90 capsule 3   No current facility-administered medications on file prior to visit.    ALLERGIES: No Known Allergies  FAMILY HISTORY: Family History  Problem Relation Age of Onset   Diabetes Father    Diabetes Other    Hypertension Other    Hyperlipidemia Other    Heart disease Other    Cancer Maternal Uncle        brain   Heart attack Brother        74   Diabetes Brother     SOCIAL HISTORY: Social History   Socioeconomic History   Marital status: Divorced    Spouse name: Not on file   Number of children: Not on file   Years of education: Not on file   Highest education level: Not on file  Occupational History   Not on file  Tobacco Use   Smoking status: Former    Packs/day: 3.00    Years: 19.00    Total pack years: 57.00    Types: Cigarettes    Start date: 10/24/1957    Quit date: 11/24/1976    Years since quitting: 45.6    Passive exposure: Past   Smokeless tobacco: Never  Vaping Use   Vaping Use: Never used  Substance and Sexual Activity   Alcohol use: Not Currently    Comment: Occasionally   Drug use: No   Sexual activity: Not on file  Other Topics  Concern   Not on file  Social History Narrative   Pt lives w/ ex wife   Right handed    Social Determinants of Health   Financial Resource Strain: Not on file  Food Insecurity: Not on file  Transportation Needs: No Transportation Needs (05/19/2022)   PRAPARE - Hydrologist (Medical): No    Lack of Transportation (Non-Medical): No  Physical Activity: Not on file  Stress: Not on file  Social Connections: Not on file  Intimate Partner Violence: Not on file     PHYSICAL EXAM: Vitals:   06/23/22 1116  BP: 106/64  Pulse: (!) 54  SpO2: 94%   General: No acute distress Head:  Normocephalic/atraumatic Skin/Extremities: No rash, no edema Neurological Exam: alert and awake. No aphasia or dysarthria. Fund of knowledge is reduced.  Recent and remote memory are impaired. Attention and concentration are normal.   Cranial nerves: Pupils equal, round. Extraocular movements intact with no nystagmus. Visual fields full.  No facial asymmetry.  Motor: Bulk and tone normal, muscle strength 5/5 throughout with no pronator drift.   Finger to nose testing intact.  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 MRI brain showed moderate to severely advanced cerebral atrophy with moderate chronic microvascular disease. No change in memory with B12 replacement and treatment of OSA. He is bradycardic, would avoid Donepezil. We discussed starting Memantine 31m qhs x 2 weeks, then increase to 160mBID. Side effects and expectations from medication were discussed. Continue to monitor driving. We discussed the importance of control of vascular risk factors, physical exercise and brain stimulation exercises, MIND diet for overall brain health. He was advised  to look into joining day programs or exercise programs in his community.  Follow-up in 6 months with Memory Disorders PA Sharene Butters, they know to call for any changes.    Thank you for allowing me to participate in his care.  Please do not hesitate to call for any questions or concerns.    Ellouise Newer, M.D.   CC: Dr. De [place]

## 2022-06-29 ENCOUNTER — Other Ambulatory Visit (HOSPITAL_BASED_OUTPATIENT_CLINIC_OR_DEPARTMENT_OTHER): Payer: Self-pay | Admitting: Family Medicine

## 2022-06-30 ENCOUNTER — Ambulatory Visit: Payer: Medicare Other | Attending: Cardiology | Admitting: Cardiology

## 2022-06-30 ENCOUNTER — Encounter: Payer: Self-pay | Admitting: Cardiology

## 2022-06-30 ENCOUNTER — Other Ambulatory Visit (HOSPITAL_BASED_OUTPATIENT_CLINIC_OR_DEPARTMENT_OTHER): Payer: Self-pay

## 2022-06-30 VITALS — BP 140/68 | HR 76 | Ht 65.0 in | Wt 199.0 lb

## 2022-06-30 DIAGNOSIS — E1122 Type 2 diabetes mellitus with diabetic chronic kidney disease: Secondary | ICD-10-CM

## 2022-06-30 DIAGNOSIS — I517 Cardiomegaly: Secondary | ICD-10-CM

## 2022-06-30 DIAGNOSIS — I5020 Unspecified systolic (congestive) heart failure: Secondary | ICD-10-CM

## 2022-06-30 DIAGNOSIS — I447 Left bundle-branch block, unspecified: Secondary | ICD-10-CM

## 2022-06-30 DIAGNOSIS — G4733 Obstructive sleep apnea (adult) (pediatric): Secondary | ICD-10-CM

## 2022-06-30 DIAGNOSIS — I420 Dilated cardiomyopathy: Secondary | ICD-10-CM | POA: Diagnosis not present

## 2022-06-30 DIAGNOSIS — E119 Type 2 diabetes mellitus without complications: Secondary | ICD-10-CM

## 2022-06-30 MED ORDER — TOUJEO MAX SOLOSTAR 300 UNIT/ML ~~LOC~~ SOPN: 80.0000 [IU] | PEN_INJECTOR | Freq: Every day | SUBCUTANEOUS | 1 refills | Status: DC

## 2022-06-30 NOTE — Addendum Note (Signed)
Addended by: Jacobo Forest D on: 06/30/2022 11:26 AM   Modules accepted: Orders

## 2022-06-30 NOTE — Progress Notes (Signed)
Cardiology Office Note:    Date:  06/30/2022   ID:  Brett, [name] 25-Mar-1946, MRN [id number]  PCP:  de [place], Raymond J, MD  Cardiologist:  Jenne Campus, MD    Referring MD: de [place], Blondell Reveal, MD   No chief complaint on file.   History of Present Illness:    Brett [name] is a 76 y.o. 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  He has been follow-up by our advanced congestive heart failure clinic.  He seems to be doing well.  Work-up so far included cardiac catheterization June 2020 which showed only luminal disease, he also got a heart MRI which show extensive late gadolinium enhancement in the mid myocardium pattern in the basal septum and basal mid inferior wall.  Finding on MRI with not consistent with hemochromatosis related cardiomyopathy, basically we know so far that this is not ischemic disease no hemochromatosis related.  He is being put on guideline directed medical therapy seems to be responding well to the present management.  He also received ICD which is Medtronic BiV device. He comes to my office today with his granddaughter who is speaking English in the room, I had admit he looks pretty good.  Does not have any evidence of decompensation, he said he does not do much she sits in the porch and watch people and dogs walking around.  Denies have any chest pain tightness squeezing pressure burning chest no swelling of lower extremities no shortness of breath.  No palpitations dizziness no discharges from the defibrillator  Past Medical History:  Diagnosis Date   Acid reflux    Allergy    Colon polyp    Diabetes mellitus without complication ([place])    Dilated cardiomyopathy ([place])    non isch   Gout    Gout    Hemochromatosis    Hypertension    OSA (obstructive sleep apnea)    Urine frequency      Past Surgical History:  Procedure Laterality Date   BIV ICD INSERTION CRT-D N/A 09/13/2021   Procedure: BIV ICD INSERTION CRT-D;  Surgeon: Constance Haw, MD;  Location: [facility];  Service: Cardiovascular;  Laterality: N/A;   EYE SURGERY Bilateral    Cataract   RIGHT/LEFT HEART CATH AND CORONARY ANGIOGRAPHY N/A 03/29/2021   Procedure: RIGHT/LEFT HEART CATH AND CORONARY ANGIOGRAPHY;  Surgeon: Nigel Mormon, MD;  Location: [facility];  Service: Cardiovascular;  Laterality: N/A;    Current Medications: Current Meds  Medication Sig   Alcohol Swabs (ALCOHOL PREP) PADS Clean injection site before each injection (Patient taking differently: 1 each by Other route See admin instructions. Clean injection site before each injection)   allopurinol (ZYLOPRIM) 300 MG tablet TAKE ONE TABLET BY MOUTH ONE TIME DAILY TO PREVENT GOUT (Patient taking differently: Take 150 mg by mouth daily. TAKE ONE TABLET BY MOUTH ONE TIME DAILY TO PREVENT GOUT)   aspirin 81 MG tablet Take 81 mg by mouth daily.   atorvastatin (LIPITOR) 20 MG tablet TAKE 1 TABLET BY MOUTH  DAILY FOR CHOLESTEROL (Patient taking differently: Take 20 mg by mouth daily. TAKE 1 TABLET BY MOUTH  DAILY FOR CHOLESTEROL)   bisoprolol (ZEBETA) 10 MG tablet Take 1 tablet (10 mg total) by mouth daily.   Cholecalciferol (VITAMIN D PO) Take 5,000 Units by mouth daily.   Cyanocobalamin (B-12 COMPLIANCE  INJECTION) 1000 MCG/ML KIT 1000 mcg daily for 1 week, then weekly for 1 month, then monthly for a year (Patient taking differently: Take 1,000 mcg by mouth daily. 1000 mcg daily for 1 week, then weekly for 1 month, then monthly for a year)   D-Mannose 500 MG CAPS Take 1,500 mg by mouth daily.   dapagliflozin propanediol (FARXIGA) 10 MG TABS tablet Take 1 tablet (10 mg total) by mouth daily.   ENTRESTO 97-103 MG TAKE 1 TABLET BY MOUTH TWICE  DAILY   famotidine (PEPCID) 40 MG tablet Take 40 mg by mouth as needed for heartburn or  indigestion.   furosemide (LASIX) 20 MG tablet Take 1 tablet (20 mg total) by mouth daily.   insulin glargine, 2 Unit Dial, (TOUJEO MAX SOLOSTAR) 300 UNIT/ML Solostar Pen Inject 80 Units into the skin daily.   Loratadine (CLARITIN PO) Take 1 tablet by mouth as needed (allergies). Unknown strength   magnesium oxide (MAG-OX) 400 MG tablet Take 400 mg by mouth daily.   Melatonin 10 MG CAPS Take 20 mg by mouth at bedtime as needed (sleep).   memantine (NAMENDA) 10 MG tablet Take 1 tablet every night for 2 weeks, then increase to 1 tablet twice a day (Patient taking differently: Take 10 mg by mouth 2 (two) times daily. Take 1 tablet every night for 2 weeks, then increase to 1 tablet twice a day)   metFORMIN (GLUCOPHAGE-XR) 500 MG 24 hr tablet TAKE 2 TABLETS BY MOUTH  TWICE DAILY WITH MEALS FOR  DIABETES (Patient taking differently: 1,000 mg daily with breakfast. TAKE 2 TABLETS BY MOUTH  TWICE DAILY WITH MEALS FOR  DIABETES)   sharps container 1 each by Does not apply route as needed. (Patient taking differently: 1 each by Does not apply route as needed (needle disposal).)   spironolactone (ALDACTONE) 25 MG tablet TAKE 1 TABLET BY MOUTH DAILY   SYRINGE-NEEDLE, DISP, 3 ML 23G X 1" 3 ML MISC Use new syringe and needle for each injection (Patient taking differently: 1 each by Other route See admin instructions. Use new syringe and needle for each injection)   tamsulosin (FLOMAX) 0.4 MG CAPS capsule TAKE 1 CAPSULE BY MOUTH AT  BEDTIME FOR PROSTATE (Patient taking differently: Take 0.4 mg by mouth daily after supper. TAKE 1 CAPSULE BY MOUTH AT  BEDTIME FOR PROSTATE)     Allergies:   Patient has no known allergies.   Social History   Socioeconomic History   Marital status: Divorced    Spouse name: Not on file   Number of children: Not on file   Years of education: Not on file   Highest education level: Not on file  Occupational History   Not on file  Tobacco Use   Smoking status: Former    Packs/day:  3.00    Years: 19.00    Total pack years: 57.00    Types: Cigarettes    Start date: 10/24/1957    Quit date: 11/24/1976    Years since quitting: 45.6    Passive exposure: Past   Smokeless tobacco: Never  Vaping Use   Vaping Use: Never used  Substance and Sexual Activity   Alcohol use: Yes    Comment: Occasionally   Drug use: No   Sexual activity: Not on file  Other Topics Concern   Not on file  Social History Narrative   Pt lives w/ ex wife   Right handed    Social Determinants of Health   Financial Resource Strain: Not  on file  Food Insecurity: Not on file  Transportation Needs: No Transportation Needs (05/19/2022)   PRAPARE - Hydrologist (Medical): No    Lack of Transportation (Non-Medical): No  Physical Activity: Not on file  Stress: Not on file  Social Connections: Not on file     Family History: The patient's family history includes Cancer in his maternal uncle; Diabetes in his brother, father, and another family member; Heart attack in his brother; Heart disease in an other family member; Hyperlipidemia in an other family member; Hypertension in an other family member. ROS:   Please see the history of present illness.    All 14 point review of systems negative except as described per history of present illness  EKGs/Labs/Other Studies Reviewed:      Recent Labs: 09/10/2021: Hemoglobin 16.5; Platelet Count 192 12/17/2021: B Natriuretic Peptide 139.1 12/21/2021: TSH 2.48 05/19/2022: BUN 22; Creatinine, Ser 1.27; Potassium 4.4; Sodium 138  Recent Lipid Panel    Component Value Date/Time   CHOL 111 03/26/2021 0326   TRIG 42 03/26/2021 0326   HDL 39 (L) 03/26/2021 0326   CHOLHDL 2.8 03/26/2021 0326   VLDL 8 03/26/2021 0326   LDLCALC 64 03/26/2021 0326   LDLCALC 56 07/06/2020 1429    Physical Exam:    VS:  There were no vitals taken for this visit.    Wt Readings from Last 3 Encounters:  06/23/22 200 lb 6.4 oz (90.9 kg)   06/16/22 201 lb 6.4 oz (91.4 kg)  05/26/22 201 lb 6.4 oz (91.4 kg)     GEN:  Well nourished, well developed in no acute distress HEENT: Normal NECK: No JVD; No carotid bruits LYMPHATICS: No lymphadenopathy CARDIAC: RRR, no murmurs, no rubs, no gallops RESPIRATORY:  Clear to auscultation without rales, wheezing or rhonchi  ABDOMEN: Soft, non-tender, non-distended MUSCULOSKELETAL:  No edema; No deformity  SKIN: Warm and dry LOWER EXTREMITIES: no swelling NEUROLOGIC:  Alert and oriented x 3 PSYCHIATRIC:  Normal affect   ASSESSMENT:    1. HFrEF (heart failure with reduced ejection fraction) (HCC)   2. Cardiomegaly   3. LBBB (left bundle branch block)   4. Dilated cardiomyopathy ([place])   5. OSA (obstructive sleep apnea)   6. Diabetes mellitus without complication (HCC)    PLAN:    In order of problems listed above:  Congestive heart failure.  He is compensated.  He is on guideline directed medical therapy which I will continue. Left bundle branch block.  He does have BiV pacing. Pacemaker present this is a Medtronic device I did review interrogation.  Normal function, no therapy delivered, functional status very low with maybe 1 hour a day of exercises.  He is OptiVol looks stable Obstructive sleep apnea to be followed by internal medicine team. He is working hard with his primary care physician to get this under control.   Medication Adjustments/Labs and Tests Ordered: Current medicines are reviewed at length with the patient today.  Concerns regarding medicines are outlined above.  No orders of the defined types were placed in this encounter.  Medication changes: No orders of the defined types were placed in this encounter.   Signed, Park Liter, MD, [facility] 06/30/2022 11:04 AM    [place]

## 2022-06-30 NOTE — Patient Instructions (Addendum)
Medication Instructions:  Your physician recommends that you continue on your current medications as directed. Please refer to the Current Medication list given to you today.  *If you need a refill on your cardiac medications before your next appointment, please call your pharmacy*   Lab Work: BMP - Today If you have labs (blood work) drawn today and your tests are completely normal, you will receive your results only by: MyChart Message (if you have MyChart) OR A paper copy in the mail If you have any lab test that is abnormal or we need to change your treatment, we will call you to review the results.   Testing/Procedures: None Ordered   Follow-Up: At [facility], you and your health needs are our priority.  As part of our continuing mission to provide you with exceptional heart care, we have created designated Provider Care Teams.  These Care Teams include your primary Cardiologist (physician) and Advanced Practice Providers (APPs -  Physician Assistants and Nurse Practitioners) who all work together to provide you with the care you need, when you need it.  We recommend signing up for the patient portal called "MyChart".  Sign up information is provided on this After Visit Summary.  MyChart is used to connect with patients for Virtual Visits (Telemedicine).  Patients are able to view lab/test results, encounter notes, upcoming appointments, etc.  Non-urgent messages can be sent to your provider as well.   To learn more about what you can do with MyChart, go to [email or web address].    Your next appointment:   6 month(s)  The format for your next appointment:   In Person  Provider:   Robert Krasowski, MD    Other Instructions NA  

## 2022-07-01 ENCOUNTER — Other Ambulatory Visit: Payer: Self-pay | Admitting: Cardiology

## 2022-07-01 LAB — BASIC METABOLIC PANEL
BUN/Creatinine Ratio: 22 (ref 10–24)
BUN: 25 mg/dL (ref 8–27)
CO2: 21 mmol/L (ref 20–29)
Calcium: 9.8 mg/dL (ref 8.6–10.2)
Chloride: 105 mmol/L (ref 96–106)
Creatinine, Ser: 1.12 mg/dL (ref 0.76–1.27)
Glucose: 112 mg/dL — ABNORMAL HIGH (ref 70–99)
Potassium: 4.2 mmol/L (ref 3.5–5.2)
Sodium: 141 mmol/L (ref 134–144)
eGFR: 69 mL/min/{1.73_m2} (ref >59)

## 2022-07-04 ENCOUNTER — Other Ambulatory Visit: Payer: Self-pay

## 2022-07-04 MED ORDER — MEMANTINE HCL 10 MG PO TABS
ORAL_TABLET | ORAL | 11 refills | Status: DC
Start: 2022-07-04 — End: 2023-05-18

## 2022-07-05 ENCOUNTER — Encounter: Payer: Self-pay | Admitting: Neurology

## 2022-07-11 ENCOUNTER — Other Ambulatory Visit: Payer: Self-pay | Admitting: Cardiology

## 2022-07-11 NOTE — Progress Notes (Signed)
Remote ICD transmission.   

## 2022-07-13 ENCOUNTER — Telehealth: Payer: Self-pay | Admitting: Cardiology

## 2022-07-13 ENCOUNTER — Telehealth: Payer: Self-pay

## 2022-07-13 NOTE — Telephone Encounter (Signed)
Results reviewed with pt's daughter-per DPR as per Dr. Krasowski's note.  Pt's daughter verbalized understanding and had no additional questions. Routed to PCP 

## 2022-07-13 NOTE — Telephone Encounter (Signed)
Patient is returning call to discuss lab results. 

## 2022-07-28 ENCOUNTER — Ambulatory Visit (INDEPENDENT_AMBULATORY_CARE_PROVIDER_SITE_OTHER): Payer: Medicare Other | Admitting: Family Medicine

## 2022-07-28 ENCOUNTER — Encounter (HOSPITAL_BASED_OUTPATIENT_CLINIC_OR_DEPARTMENT_OTHER): Payer: Self-pay | Admitting: Family Medicine

## 2022-07-28 VITALS — BP 110/63 | HR 64 | Temp 97.8°F | Ht 65.0 in | Wt 197.0 lb

## 2022-07-28 DIAGNOSIS — Z794 Long term (current) use of insulin: Secondary | ICD-10-CM

## 2022-07-28 DIAGNOSIS — N182 Chronic kidney disease, stage 2 (mild): Secondary | ICD-10-CM | POA: Diagnosis not present

## 2022-07-28 DIAGNOSIS — Z23 Encounter for immunization: Secondary | ICD-10-CM | POA: Diagnosis not present

## 2022-07-28 DIAGNOSIS — E1122 Type 2 diabetes mellitus with diabetic chronic kidney disease: Secondary | ICD-10-CM | POA: Diagnosis not present

## 2022-07-28 NOTE — Assessment & Plan Note (Addendum)
Patient accompanied today by his granddaughter, usually his daughter comes with him to appointments.  Patient and granddaughter do not have any specific concerns today.  She does relay that patient's daughter wanted [place] to know that he has been doing well with current insulin regimen, they do feel that treatment and monitoring has been much easier with simplified insulin regimen and he continues to do well with this.  No reported episodes of hypoglycemia.  Hemoglobin A1c was completed at last office visit and was found to be at goal at 7.1%.  Overall patient is doing well at this time We will not make any changes to diabetes medications today We will check urine microalbumin/creatinine ratio today for screening Plan to recheck hemoglobin A1c around next office visit in 2 to 3 months

## 2022-07-28 NOTE — Patient Instructions (Signed)
  Medication Instructions:  Your physician recommends that you continue on your current medications as directed. Please refer to the Current Medication list given to you today. --If you need a refill on any your medications before your next appointment, please call your pharmacy first. If no refills are authorized on file call the office.-- Lab Work: Your physician has recommended that you have lab work today: Yes, Urine  If you have labs (blood work) drawn today and your tests are completely normal, you will receive your results via [place] a phone call from our staff.  Please ensure you check your voicemail in the event that you authorized detailed messages to be left on a delegated number. If you have any lab test that is abnormal or we need to change your treatment, we will call you to review the results.  Referrals/Procedures/Imaging: No  Follow-Up: Your next appointment:   Your physician recommends that you schedule a follow-up appointment in: 2-3 months with Dr. de [place].  You will receive a text message or e-mail with a link to a survey about your care and experience with [place] today! We would greatly appreciate your feedback!   Thanks for letting us be apart of your health journey!!  Primary Care and Sports Medicine   Dr. Arlina Robes [place]   We encourage you to activate your patient portal called "MyChart".  Sign up information is provided on this After Visit Summary.  MyChart is used to connect with patients for Virtual Visits (Telemedicine).  Patients are able to view lab/test results, encounter notes, upcoming appointments, etc.  Non-urgent messages can be sent to your provider as well. To learn more about what you can do with MyChart, please visit --  [email or web address].

## 2022-07-28 NOTE — Progress Notes (Signed)
    Procedures performed today:    None.  Independent interpretation of notes and tests performed by another provider:   None.  Brief History, Exam, Impression, and Recommendations:    BP 110/63   Pulse 64   Temp 97.8 F (36.6 C) (Oral)   Ht '5\' 5"'$  (1.651 m)   Wt 197 lb (89.4 kg)   SpO2 96%   BMI 32.78 kg/m   Type 2 diabetes mellitus with stage 2 chronic kidney disease, with long-term current use of insulin ([name]) Patient accompanied today by his granddaughter, usually his daughter comes with him to appointments.  Patient and granddaughter do not have any specific concerns today.  She does relay that patient's daughter wanted [place] to know that he has been doing well with current insulin regimen, they do feel that treatment and monitoring has been much easier with simplified insulin regimen and he continues to do well with this.  No reported episodes of hypoglycemia.  Hemoglobin A1c was completed at last office visit and was found to be at goal at 7.1%.  Overall patient is doing well at this time We will not make any changes to diabetes medications today We will check urine microalbumin/creatinine ratio today for screening Plan to recheck hemoglobin A1c around next office visit in 2 to 3 months  Return in about 3 months (around 10/28/2022) for DM. Patient due for seasonal influenza vaccine, interested in receiving today, administered today   ___________________________________________ Brett Phillippi de [place], MD, ABFM, CAQSM Primary Care and [place]

## 2022-07-29 LAB — MICROALBUMIN / CREATININE URINE RATIO
Creatinine, Urine: 81.1 mg/dL
Microalb/Creat Ratio: 4 mg/g creat (ref 0–29)
Microalbumin, Urine: 3.1 ug/mL

## 2022-08-26 ENCOUNTER — Other Ambulatory Visit: Payer: Self-pay | Admitting: Cardiology

## 2022-08-29 NOTE — Telephone Encounter (Signed)
Rx refill sent to pharmacy. 

## 2022-09-08 ENCOUNTER — Encounter (HOSPITAL_BASED_OUTPATIENT_CLINIC_OR_DEPARTMENT_OTHER): Payer: Self-pay | Admitting: Family Medicine

## 2022-09-09 ENCOUNTER — Inpatient Hospital Stay: Payer: Medicare Other

## 2022-09-09 ENCOUNTER — Telehealth: Payer: Self-pay | Admitting: *Deleted

## 2022-09-09 ENCOUNTER — Inpatient Hospital Stay: Payer: Medicare Other | Attending: Oncology | Admitting: Oncology

## 2022-09-09 ENCOUNTER — Other Ambulatory Visit (HOSPITAL_BASED_OUTPATIENT_CLINIC_OR_DEPARTMENT_OTHER): Payer: Self-pay

## 2022-09-09 DIAGNOSIS — I110 Hypertensive heart disease with heart failure: Secondary | ICD-10-CM | POA: Diagnosis not present

## 2022-09-09 DIAGNOSIS — E1122 Type 2 diabetes mellitus with diabetic chronic kidney disease: Secondary | ICD-10-CM

## 2022-09-09 DIAGNOSIS — E7800 Pure hypercholesterolemia, unspecified: Secondary | ICD-10-CM | POA: Diagnosis not present

## 2022-09-09 DIAGNOSIS — G309 Alzheimer's disease, unspecified: Secondary | ICD-10-CM | POA: Diagnosis not present

## 2022-09-09 DIAGNOSIS — Z87891 Personal history of nicotine dependence: Secondary | ICD-10-CM | POA: Insufficient documentation

## 2022-09-09 DIAGNOSIS — E119 Type 2 diabetes mellitus without complications: Secondary | ICD-10-CM | POA: Diagnosis not present

## 2022-09-09 DIAGNOSIS — G4730 Sleep apnea, unspecified: Secondary | ICD-10-CM | POA: Insufficient documentation

## 2022-09-09 DIAGNOSIS — I509 Heart failure, unspecified: Secondary | ICD-10-CM | POA: Diagnosis not present

## 2022-09-09 DIAGNOSIS — M109 Gout, unspecified: Secondary | ICD-10-CM | POA: Diagnosis not present

## 2022-09-09 LAB — CBC WITH DIFFERENTIAL (CANCER CENTER ONLY)
Abs Immature Granulocytes: 0.04 10*3/uL (ref 0.00–0.07)
Basophils Absolute: 0 10*3/uL (ref 0.0–0.1)
Basophils Relative: 0 %
Eosinophils Absolute: 0 10*3/uL (ref 0.0–0.5)
Eosinophils Relative: 0 %
HCT: 45.5 % (ref 39.0–52.0)
Hemoglobin: 16.2 g/dL (ref 13.0–17.0)
Immature Granulocytes: 1 %
Lymphocytes Relative: 14 %
Lymphs Abs: 1.1 10*3/uL (ref 0.7–4.0)
MCH: 33.2 pg (ref 26.0–34.0)
MCHC: 35.6 g/dL (ref 30.0–36.0)
MCV: 93.2 fL (ref 80.0–100.0)
Monocytes Absolute: 0.5 10*3/uL (ref 0.1–1.0)
Monocytes Relative: 6 %
Neutro Abs: 6 10*3/uL (ref 1.7–7.7)
Neutrophils Relative %: 79 %
Platelet Count: 186 10*3/uL (ref 150–400)
RBC: 4.88 MIL/uL (ref 4.22–5.81)
RDW: 13.4 % (ref 11.5–15.5)
WBC Count: 7.7 10*3/uL (ref 4.0–10.5)
nRBC: 0 % (ref 0.0–0.2)

## 2022-09-09 LAB — FERRITIN: Ferritin: 83 ng/mL (ref 24–336)

## 2022-09-09 MED ORDER — METFORMIN HCL ER 500 MG PO TB24: ORAL_TABLET | ORAL | 3 refills | Status: DC

## 2022-09-09 NOTE — Telephone Encounter (Signed)
-----   Message from [name], MD sent at 09/09/2022  1:25 PM EST ----- Please call daughter, the ferritin is stable, follow-up as scheduled

## 2022-09-09 NOTE — Telephone Encounter (Signed)
Left VM for daughter that ferritin is stable and to f/u as scheduled.

## 2022-09-09 NOTE — Progress Notes (Signed)
  [place] OFFICE PROGRESS NOTE   Diagnosis: Hemochromatosis  INTERVAL HISTORY:   [name] returns as scheduled.  He is here with his daughter.  No specific complaint.  He has been diagnosed with Alzheimer's.  He is followed by cardiology for CHF.  Objective:  Vital signs in last 24 hours:  Blood pressure 120/75, pulse 73, temperature 98.2 F (36.8 C), temperature source Oral, resp. rate 18, height '5\' 5"'$  (1.651 m), weight 203 lb (92.1 kg), SpO2 100 %.    Resp: Lungs clear bilaterally Cardio: Distant heart sounds, regular rate and rhythm GI: Protuberant, no hepatosplenomegaly, no apparent ascites Vascular: No leg edema  Lab Results:  Lab Results  Component Value Date   WBC 7.7 09/09/2022   HGB 16.2 09/09/2022   HCT 45.5 09/09/2022   MCV 93.2 09/09/2022   PLT 186 09/09/2022   NEUTROABS 6.0 09/09/2022    CMP  Lab Results  Component Value Date   NA 141 06/30/2022   K 4.2 06/30/2022   CL 105 06/30/2022   CO2 21 06/30/2022   GLUCOSE 112 (H) 06/30/2022   BUN 25 06/30/2022   CREATININE 1.12 06/30/2022   CALCIUM 9.8 06/30/2022   PROT 6.4 (L) 03/25/2021   ALBUMIN 3.9 03/25/2021   AST 20 03/25/2021   ALT 28 03/25/2021   ALKPHOS 33 (L) 03/25/2021   BILITOT 1.1 03/25/2021   GFRNONAA 59 (L) 05/19/2022   GFRAA 80 09/16/2020    Medications: I have reviewed the patient's current medications.   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 He was last treated with phlebotomy therapy on 09/16/2019 History of tobacco use in the remote past. Hypertension. Hypercholesterolemia. Diabetes. Gout. Sleep apnea. Family history of hemochromatosis.  9.   Alzheimer's 10.  CHF 11.  Sleep apnea     Disposition: Mr Brett [name] has hereditary hemochromatosis.  He last underwent phlebotomy therapy 3 years ago.  The goal ferritin is less than 100.  He has multiple comorbid conditions including Alzheimer's.  We will follow-up  on the ferritin level from today and consider raising the goal ferritin level.  He will return for an office visit and ferritin in 1 year.  I am available to see him sooner as needed.  Betsy Coder, MD  09/09/2022  9:27 AM

## 2022-09-12 ENCOUNTER — Ambulatory Visit (INDEPENDENT_AMBULATORY_CARE_PROVIDER_SITE_OTHER): Payer: PRIVATE HEALTH INSURANCE

## 2022-09-12 DIAGNOSIS — I428 Other cardiomyopathies: Secondary | ICD-10-CM | POA: Diagnosis not present

## 2022-09-12 DIAGNOSIS — I5020 Unspecified systolic (congestive) heart failure: Secondary | ICD-10-CM

## 2022-09-13 LAB — CUP PACEART REMOTE DEVICE CHECK
Battery Remaining Longevity: 97 mo
Battery Voltage: 3 V
Brady Statistic AP VP Percent: 55.08 %
Brady Statistic AP VS Percent: 0.99 %
Brady Statistic AS VP Percent: 42.39 %
Brady Statistic AS VS Percent: 1.54 %
Brady Statistic RA Percent Paced: 51.08 %
Brady Statistic RV Percent Paced: 32.54 %
Date Time Interrogation Session: 20231120033323
HighPow Impedance: 60 Ohm
Implantable Lead Connection Status: 753985
Implantable Lead Connection Status: 753985
Implantable Lead Connection Status: 753985
Implantable Lead Implant Date: 20221121
Implantable Lead Implant Date: 20221121
Implantable Lead Implant Date: 20221121
Implantable Lead Location: 753858
Implantable Lead Location: 753859
Implantable Lead Location: 753860
Implantable Lead Model: 4598
Implantable Lead Model: 5076
Implantable Pulse Generator Implant Date: 20221121
Lead Channel Impedance Value: 141.867
Lead Channel Impedance Value: 145.871
Lead Channel Impedance Value: 156.471
Lead Channel Impedance Value: 168.889
Lead Channel Impedance Value: 174.595
Lead Channel Impedance Value: 266 Ohm
Lead Channel Impedance Value: 304 Ohm
Lead Channel Impedance Value: 304 Ohm
Lead Channel Impedance Value: 323 Ohm
Lead Channel Impedance Value: 380 Ohm
Lead Channel Impedance Value: 380 Ohm
Lead Channel Impedance Value: 399 Ohm
Lead Channel Impedance Value: 437 Ohm
Lead Channel Impedance Value: 456 Ohm
Lead Channel Impedance Value: 494 Ohm
Lead Channel Impedance Value: 570 Ohm
Lead Channel Impedance Value: 570 Ohm
Lead Channel Impedance Value: 589 Ohm
Lead Channel Pacing Threshold Amplitude: 0.75 V
Lead Channel Pacing Threshold Amplitude: 0.875 V
Lead Channel Pacing Threshold Amplitude: 0.875 V
Lead Channel Pacing Threshold Pulse Width: 0.4 ms
Lead Channel Pacing Threshold Pulse Width: 0.4 ms
Lead Channel Pacing Threshold Pulse Width: 0.4 ms
Lead Channel Sensing Intrinsic Amplitude: 1.25 mV
Lead Channel Sensing Intrinsic Amplitude: 1.25 mV
Lead Channel Sensing Intrinsic Amplitude: 17.875 mV
Lead Channel Sensing Intrinsic Amplitude: 17.875 mV
Lead Channel Setting Pacing Amplitude: 1.5 V
Lead Channel Setting Pacing Amplitude: 1.5 V
Lead Channel Setting Pacing Amplitude: 2 V
Lead Channel Setting Pacing Pulse Width: 0.4 ms
Lead Channel Setting Pacing Pulse Width: 0.4 ms
Lead Channel Setting Sensing Sensitivity: 0.3 mV
Zone Setting Status: 755011
Zone Setting Status: 755011

## 2022-09-17 ENCOUNTER — Other Ambulatory Visit: Payer: Self-pay | Admitting: Nurse Practitioner

## 2022-09-17 DIAGNOSIS — N138 Other obstructive and reflux uropathy: Secondary | ICD-10-CM

## 2022-09-20 ENCOUNTER — Other Ambulatory Visit (HOSPITAL_COMMUNITY): Payer: Self-pay

## 2022-09-20 MED ORDER — FUROSEMIDE 20 MG PO TABS: 20.0000 mg | ORAL_TABLET | Freq: Every day | ORAL | 3 refills | Status: DC

## 2022-10-02 ENCOUNTER — Other Ambulatory Visit (HOSPITAL_BASED_OUTPATIENT_CLINIC_OR_DEPARTMENT_OTHER): Payer: Self-pay | Admitting: Family Medicine

## 2022-10-02 DIAGNOSIS — E1122 Type 2 diabetes mellitus with diabetic chronic kidney disease: Secondary | ICD-10-CM

## 2022-10-03 ENCOUNTER — Encounter (HOSPITAL_BASED_OUTPATIENT_CLINIC_OR_DEPARTMENT_OTHER): Payer: Self-pay | Admitting: Family Medicine

## 2022-10-03 ENCOUNTER — Encounter (INDEPENDENT_AMBULATORY_CARE_PROVIDER_SITE_OTHER): Payer: Medicare Other | Admitting: Ophthalmology

## 2022-10-03 DIAGNOSIS — H35033 Hypertensive retinopathy, bilateral: Secondary | ICD-10-CM | POA: Diagnosis not present

## 2022-10-03 DIAGNOSIS — H35372 Puckering of macula, left eye: Secondary | ICD-10-CM | POA: Diagnosis not present

## 2022-10-03 DIAGNOSIS — E113393 Type 2 diabetes mellitus with moderate nonproliferative diabetic retinopathy without macular edema, bilateral: Secondary | ICD-10-CM

## 2022-10-03 DIAGNOSIS — I10 Essential (primary) hypertension: Secondary | ICD-10-CM | POA: Diagnosis not present

## 2022-10-03 DIAGNOSIS — H43821 Vitreomacular adhesion, right eye: Secondary | ICD-10-CM

## 2022-10-11 ENCOUNTER — Other Ambulatory Visit (HOSPITAL_BASED_OUTPATIENT_CLINIC_OR_DEPARTMENT_OTHER): Payer: Self-pay | Admitting: Family Medicine

## 2022-10-13 ENCOUNTER — Ambulatory Visit (INDEPENDENT_AMBULATORY_CARE_PROVIDER_SITE_OTHER): Payer: Medicare Other | Admitting: Family Medicine

## 2022-10-13 ENCOUNTER — Other Ambulatory Visit (HOSPITAL_BASED_OUTPATIENT_CLINIC_OR_DEPARTMENT_OTHER): Payer: Self-pay

## 2022-10-13 ENCOUNTER — Encounter (HOSPITAL_BASED_OUTPATIENT_CLINIC_OR_DEPARTMENT_OTHER): Payer: Self-pay | Admitting: Family Medicine

## 2022-10-13 DIAGNOSIS — N182 Chronic kidney disease, stage 2 (mild): Secondary | ICD-10-CM | POA: Diagnosis not present

## 2022-10-13 DIAGNOSIS — Z794 Long term (current) use of insulin: Secondary | ICD-10-CM | POA: Diagnosis not present

## 2022-10-13 DIAGNOSIS — E1122 Type 2 diabetes mellitus with diabetic chronic kidney disease: Secondary | ICD-10-CM

## 2022-10-13 DIAGNOSIS — R32 Unspecified urinary incontinence: Secondary | ICD-10-CM

## 2022-10-13 LAB — HEMOGLOBIN A1C
Est. average glucose Bld gHb Est-mCnc: 143 mg/dL
Hgb A1c MFr Bld: 6.6 % — ABNORMAL HIGH (ref 4.8–5.6)

## 2022-10-13 MED ORDER — ALLOPURINOL 300 MG PO TABS: ORAL_TABLET | ORAL | 0 refills | Status: DC

## 2022-10-13 MED ORDER — METFORMIN HCL ER 500 MG PO TB24: 1000.0000 mg | ORAL_TABLET | Freq: Every day | ORAL | 3 refills | Status: DC

## 2022-10-13 MED ORDER — TOUJEO MAX SOLOSTAR 300 UNIT/ML ~~LOC~~ SOPN: 78.0000 [IU] | PEN_INJECTOR | Freq: Every day | SUBCUTANEOUS | 3 refills | Status: DC

## 2022-10-13 NOTE — Assessment & Plan Note (Signed)
Patient brought to clinic by daughter with concerns for recent low blood sugar readings.  Daughter reports that patient has been having some fasting blood sugars in the morning that has been in the 60s to 80s.  She reports that with these findings, she has been making some adjustments in the patient's insulin, however she has been slightly increasing dose of insulin.  It seems there was some misunderstanding regarding recommended insulin dose adjustments for when blood sugars are outside of desired range.  Patient has not been having any symptoms of sweats, jitteriness, shakiness.  Has not had any severely low blood sugar readings resulting in syncope or seizures. Last hemoglobin A1c was at goal at 7.1%, this was about 4 months ago We will proceed with checking hemoglobin A1c today Discussed recommendations for insulin management.  Given that patient has been having low blood sugar readings, would be prudent to decrease dose of insulin at this time to allow for improved blood sugar readings.  Discussed at length that we would be more amenable to having slightly higher blood sugar readings standing aiming for very tight control of blood sugars due to concern for hypoglycemia and its associated complications.  Today, we will decrease insulin dose to 78 units daily.  Discussed that if blood sugar readings continue to be less than 80, can further decrease dose of insulin by 2 units.  She will send a follow-up message in about 3 to 4 days to review fasting blood sugar readings so that we may regarding insulin management. Patient has been missing some doses of the evening medications.  This does include metformin.  We discussed options today and we will remove nighttime administration of metformin given that patient is on extended release form of medication.  We will simply continue with 1000 mg dose in the morning and monitor progress with this. Will plan for follow-up in about 4 weeks or sooner as needed

## 2022-10-13 NOTE — Progress Notes (Signed)
    Procedures performed today:    None.  Independent interpretation of notes and tests performed by another provider:   None.  Brief History, Exam, Impression, and Recommendations:    BP 111/65 (BP Location: Right Arm, Patient Position: Sitting, Cuff Size: Large)   Pulse 64   Temp 97.9 F (36.6 C) (Oral)   Ht '5\' 5"'$  (1.651 m)   Wt 199 lb (90.3 kg)   SpO2 99%   BMI 33.12 kg/m   Type 2 diabetes mellitus with stage 2 chronic kidney disease, with long-term current use of insulin ([place]) Patient brought to clinic by daughter with concerns for recent low blood sugar readings.  Daughter reports that patient has been having some fasting blood sugars in the morning that has been in the 60s to 80s.  She reports that with these findings, she has been making some adjustments in the patient's insulin, however she has been slightly increasing dose of insulin.  It seems there was some misunderstanding regarding recommended insulin dose adjustments for when blood sugars are outside of desired range.  Patient has not been having any symptoms of sweats, jitteriness, shakiness.  Has not had any severely low blood sugar readings resulting in syncope or seizures. Last hemoglobin A1c was at goal at 7.1%, this was about 4 months ago We will proceed with checking hemoglobin A1c today Discussed recommendations for insulin management.  Given that patient has been having low blood sugar readings, would be prudent to decrease dose of insulin at this time to allow for improved blood sugar readings.  Discussed at length that we would be more amenable to having slightly higher blood sugar readings standing aiming for very tight control of blood sugars due to concern for hypoglycemia and its associated complications.  Today, we will decrease insulin dose to 78 units daily.  Discussed that if blood sugar readings continue to be less than 80, can further decrease dose of insulin by 2 units.  She will send a follow-up message  in about 3 to 4 days to review fasting blood sugar readings so that we may regarding insulin management. Patient has been missing some doses of the evening medications.  This does include metformin.  We discussed options today and we will remove nighttime administration of metformin given that patient is on extended release form of medication.  We will simply continue with 1000 mg dose in the morning and monitor progress with this. Will plan for follow-up in about 4 weeks or sooner as needed  Spent 34 minutes on this patient encounter, including preparation, chart review, face-to-face counseling with patient and coordination of care, and documentation of encounter  Return in about 6 weeks (around 11/24/2022) for DM.   ___________________________________________ Brelynn Wheller de [place], MD, ABFM, CAQSM Primary Care and [place]

## 2022-10-20 ENCOUNTER — Encounter (HOSPITAL_BASED_OUTPATIENT_CLINIC_OR_DEPARTMENT_OTHER): Payer: Self-pay | Admitting: Family Medicine

## 2022-10-21 NOTE — Progress Notes (Signed)
Remote ICD transmission.   

## 2022-11-03 ENCOUNTER — Ambulatory Visit (HOSPITAL_BASED_OUTPATIENT_CLINIC_OR_DEPARTMENT_OTHER): Payer: Medicare Other | Admitting: Family Medicine

## 2022-11-13 ENCOUNTER — Other Ambulatory Visit: Payer: Self-pay | Admitting: Nurse Practitioner

## 2022-11-13 DIAGNOSIS — N138 Other obstructive and reflux uropathy: Secondary | ICD-10-CM

## 2022-11-16 ENCOUNTER — Other Ambulatory Visit (HOSPITAL_BASED_OUTPATIENT_CLINIC_OR_DEPARTMENT_OTHER): Payer: Self-pay

## 2022-11-16 DIAGNOSIS — R32 Unspecified urinary incontinence: Secondary | ICD-10-CM

## 2022-11-16 MED ORDER — ALLOPURINOL 300 MG PO TABS: ORAL_TABLET | ORAL | 3 refills | Status: DC

## 2022-11-21 DIAGNOSIS — G4733 Obstructive sleep apnea (adult) (pediatric): Secondary | ICD-10-CM | POA: Diagnosis not present

## 2022-11-22 DIAGNOSIS — G4733 Obstructive sleep apnea (adult) (pediatric): Secondary | ICD-10-CM | POA: Diagnosis not present

## 2022-11-24 ENCOUNTER — Encounter (HOSPITAL_BASED_OUTPATIENT_CLINIC_OR_DEPARTMENT_OTHER): Payer: Self-pay | Admitting: Family Medicine

## 2022-11-24 ENCOUNTER — Ambulatory Visit (INDEPENDENT_AMBULATORY_CARE_PROVIDER_SITE_OTHER): Payer: Medicare Other | Admitting: Family Medicine

## 2022-11-24 VITALS — BP 142/68 | HR 60 | Ht 65.0 in | Wt 205.2 lb

## 2022-11-24 DIAGNOSIS — Z794 Long term (current) use of insulin: Secondary | ICD-10-CM | POA: Diagnosis not present

## 2022-11-24 DIAGNOSIS — E1122 Type 2 diabetes mellitus with diabetic chronic kidney disease: Secondary | ICD-10-CM

## 2022-11-24 DIAGNOSIS — N182 Chronic kidney disease, stage 2 (mild): Secondary | ICD-10-CM | POA: Diagnosis not present

## 2022-11-24 NOTE — Assessment & Plan Note (Addendum)
Taking Toujeo insulin 78 units every morning and metformin 500 mg at night as prescribed.  Denies chest pain, shortness of breath, vision changes, polydipsia, polyphagia, polyuria.  Last A1c on 1221 6.6, reports no episodes of hypoglycemia with insulin adjustment in December.  Fasting blood sugars are ranging from 150s to 190s.  Will continue Toujeo 78 units SQ and metformin XR 500 mg QD. Well-controlled on new insulin dose. A1C due in 2 months.

## 2022-11-24 NOTE — Progress Notes (Signed)
   Established Patient Office Visit  Subjective   Patient ID: Brett [name], male    DOB: December 01, 1945  Age: 77 y.o. MRN: [id number]  Chief Complaint  Patient presents with   Follow-up    Pt here for f/u on DM     HPI [place]-up for diabetes due to dose changes in December.  Much better controlled, 150-190's fasting. No episodes of hypoglycemia since changes in insulin dose.  Diabetes: Medication compliance: Toujeo insulin 78 units in morning, metformin 500 mg daily  Denies chest pain, shortness of breath, vision changes, polydipsia, polyphagia, polyuria. Pertinent lab work: A1C: 6.6 on 10/13/22 Monitoring: blood sugar readings at home: 150-190's fasting          Continue current medication regimen: Toujeo 78 units SQ and metformin XR 500 mg QD.  Follow-up: in March     Review of Systems  Constitutional:  Negative for malaise/fatigue.  Eyes:  Negative for blurred vision and double vision.  Respiratory:  Negative for shortness of breath.   Cardiovascular:  Negative for chest pain and leg swelling.  Neurological:  Negative for dizziness and headaches.      Objective:     BP (!) 142/68 (BP Location: Right Arm, Patient Position: Sitting, Cuff Size: Large)   Pulse 60   Ht '5\' 5"'$  (1.651 m)   Wt 205 lb 3.2 oz (93.1 kg)   SpO2 98%   BMI 34.15 kg/m    Physical Exam Vitals and nursing note reviewed.  Constitutional:      General: He is not in acute distress.    Appearance: Normal appearance.  Cardiovascular:     Rate and Rhythm: Normal rate and regular rhythm.     Heart sounds: Normal heart sounds.  Pulmonary:     Effort: Pulmonary effort is normal.     Breath sounds: Normal breath sounds.  Skin:    General: Skin is warm and dry.     Capillary Refill: Capillary refill takes less than 2 seconds.  Neurological:     General: No focal deficit present.     Mental Status: He is alert. Mental status is at baseline.  Psychiatric:        Mood and Affect: Mood normal.         Behavior: Behavior normal.      No results found for any visits on 11/24/22.    The ASCVD Risk score (Arnett DK, et al., 2019) failed to calculate for the following reasons:   The valid total cholesterol range is 130 to 320 mg/dL    Assessment & Plan:   Problem List Items Addressed This Visit     Type 2 diabetes mellitus with stage 2 chronic kidney disease, with long-term current use of insulin ([facility]) - Primary    Taking Toujeo insulin 78 units every morning and metformin 500 mg at night as prescribed.  Denies chest pain, shortness of breath, vision changes, polydipsia, polyphagia, polyuria.  Last A1c on 1221 6.6, reports no episodes of hypoglycemia with insulin adjustment in December.  Fasting blood sugars are ranging from 150s to 190s.  Will continue Toujeo 78 units SQ and metformin XR 500 mg QD. Well-controlled on new insulin dose. A1C due in 2 months.     Agrees with plan of care discussed.  Questions answered.   Return for follow-up in late march with PCP.    Chalmers Guest, FNP

## 2022-12-02 ENCOUNTER — Other Ambulatory Visit: Payer: Self-pay

## 2022-12-12 ENCOUNTER — Ambulatory Visit (INDEPENDENT_AMBULATORY_CARE_PROVIDER_SITE_OTHER): Payer: PRIVATE HEALTH INSURANCE

## 2022-12-12 DIAGNOSIS — I447 Left bundle-branch block, unspecified: Secondary | ICD-10-CM

## 2022-12-12 DIAGNOSIS — I428 Other cardiomyopathies: Secondary | ICD-10-CM

## 2022-12-12 LAB — CUP PACEART REMOTE DEVICE CHECK
Battery Remaining Longevity: 94 mo
Battery Voltage: 2.99 V
Brady Statistic AP VP Percent: 64.9 %
Brady Statistic AP VS Percent: 1.08 %
Brady Statistic AS VP Percent: 32.77 %
Brady Statistic AS VS Percent: 1.25 %
Brady Statistic RA Percent Paced: 59.83 %
Brady Statistic RV Percent Paced: 49.03 %
Date Time Interrogation Session: 20240219022725
HighPow Impedance: 63 Ohm
Implantable Lead Connection Status: 753985
Implantable Lead Connection Status: 753985
Implantable Lead Connection Status: 753985
Implantable Lead Implant Date: 20221121
Implantable Lead Implant Date: 20221121
Implantable Lead Implant Date: 20221121
Implantable Lead Location: 753858
Implantable Lead Location: 753859
Implantable Lead Location: 753860
Implantable Lead Model: 4598
Implantable Lead Model: 5076
Implantable Pulse Generator Implant Date: 20221121
Lead Channel Impedance Value: 145.871
Lead Channel Impedance Value: 145.871
Lead Channel Impedance Value: 156.471
Lead Channel Impedance Value: 174.595
Lead Channel Impedance Value: 174.595
Lead Channel Impedance Value: 266 Ohm
Lead Channel Impedance Value: 323 Ohm
Lead Channel Impedance Value: 323 Ohm
Lead Channel Impedance Value: 323 Ohm
Lead Channel Impedance Value: 380 Ohm
Lead Channel Impedance Value: 380 Ohm
Lead Channel Impedance Value: 399 Ohm
Lead Channel Impedance Value: 399 Ohm
Lead Channel Impedance Value: 513 Ohm
Lead Channel Impedance Value: 513 Ohm
Lead Channel Impedance Value: 570 Ohm
Lead Channel Impedance Value: 589 Ohm
Lead Channel Impedance Value: 589 Ohm
Lead Channel Pacing Threshold Amplitude: 0.75 V
Lead Channel Pacing Threshold Amplitude: 0.875 V
Lead Channel Pacing Threshold Amplitude: 0.875 V
Lead Channel Pacing Threshold Pulse Width: 0.4 ms
Lead Channel Pacing Threshold Pulse Width: 0.4 ms
Lead Channel Pacing Threshold Pulse Width: 0.4 ms
Lead Channel Sensing Intrinsic Amplitude: 2.5 mV
Lead Channel Sensing Intrinsic Amplitude: 2.5 mV
Lead Channel Sensing Intrinsic Amplitude: 20.75 mV
Lead Channel Sensing Intrinsic Amplitude: 20.75 mV
Lead Channel Setting Pacing Amplitude: 1.5 V
Lead Channel Setting Pacing Amplitude: 1.5 V
Lead Channel Setting Pacing Amplitude: 2 V
Lead Channel Setting Pacing Pulse Width: 0.4 ms
Lead Channel Setting Pacing Pulse Width: 0.4 ms
Lead Channel Setting Sensing Sensitivity: 0.3 mV
Zone Setting Status: 755011
Zone Setting Status: 755011

## 2022-12-13 ENCOUNTER — Telehealth (HOSPITAL_BASED_OUTPATIENT_CLINIC_OR_DEPARTMENT_OTHER): Payer: Self-pay | Admitting: Family Medicine

## 2022-12-13 NOTE — Telephone Encounter (Signed)
Called patient to schedule Medicare Annual Wellness Visit (AWV). Left message for patient to call back and schedule Medicare Annual Wellness Visit (AWV).  Last date of AWV: 12/17/2019   Please schedule an appointment at any time with [place], [facility].  If any questions, please contact me at [phone].    Thank you,  [place] Direct dial  [phone]

## 2022-12-20 ENCOUNTER — Other Ambulatory Visit: Payer: Self-pay

## 2022-12-21 ENCOUNTER — Other Ambulatory Visit: Payer: Self-pay

## 2022-12-22 ENCOUNTER — Ambulatory Visit (INDEPENDENT_AMBULATORY_CARE_PROVIDER_SITE_OTHER): Payer: Medicare Other | Admitting: Physician Assistant

## 2022-12-22 ENCOUNTER — Other Ambulatory Visit (INDEPENDENT_AMBULATORY_CARE_PROVIDER_SITE_OTHER): Payer: Medicare Other

## 2022-12-22 ENCOUNTER — Encounter: Payer: Self-pay | Admitting: Physician Assistant

## 2022-12-22 VITALS — BP 124/80 | HR 64 | Resp 18 | Ht 65.0 in

## 2022-12-22 DIAGNOSIS — E539 Vitamin B deficiency, unspecified: Secondary | ICD-10-CM

## 2022-12-22 DIAGNOSIS — G309 Alzheimer's disease, unspecified: Secondary | ICD-10-CM

## 2022-12-22 DIAGNOSIS — F0280 Dementia in other diseases classified elsewhere without behavioral disturbance: Secondary | ICD-10-CM

## 2022-12-22 DIAGNOSIS — G4733 Obstructive sleep apnea (adult) (pediatric): Secondary | ICD-10-CM | POA: Diagnosis not present

## 2022-12-22 LAB — VITAMIN B12: Vitamin B-12: 474 pg/mL (ref 211–911)

## 2022-12-22 NOTE — Patient Instructions (Addendum)
Good to see you.  Continue Memantine '10mg'$  1 tablet twice a day  2. Continue to monitor driving  3. Recommend looking into joining day programs or exercise programs  Check the B12 today   4. Follow-up in 6 months with Memory Disorders PA Sharene Butters, call for any changes   FALL PRECAUTIONS: Be cautious when walking. Scan the area for obstacles that may increase the risk of trips and falls. When getting up in the mornings, sit up at the edge of the bed for a few minutes before getting out of bed. Consider elevating the bed at the head end to avoid drop of blood pressure when getting up. Walk always in a well-lit room (use night lights in the walls). Avoid area rugs or power cords from appliances in the middle of the walkways. Use a walker or a cane if necessary and consider physical therapy for balance exercise. Get your eyesight checked regularly.  FINANCIAL OVERSIGHT: Supervision, especially oversight when making financial decisions or transactions is also recommended.  HOME SAFETY: Consider the safety of the kitchen when operating appliances like stoves, microwave oven, and blender. Consider having supervision and share cooking responsibilities until no longer able to participate in those. Accidents with firearms and other hazards in the house should be identified and addressed as well.  DRIVING: Regarding driving, in patients with progressive memory problems, driving will be impaired. We advise to have someone else do the driving if trouble finding directions or if minor accidents are reported. Independent driving assessment is available to determine safety of driving.  ABILITY TO BE LEFT ALONE: If patient is unable to contact 911 operator, consider using LifeLine, or when the need is there, arrange for someone to stay with patients. Smoking is a fire hazard, consider supervision or cessation. Risk of wandering should be assessed by caregiver and if detected at any point, supervision and safe  proof recommendations should be instituted.  MEDICATION SUPERVISION: Inability to self-administer medication needs to be constantly addressed. Implement a mechanism to ensure safe administration of the medications.  RECOMMENDATIONS FOR ALL PATIENTS WITH MEMORY PROBLEMS: 1. Continue to exercise (Recommend 30 minutes of walking everyday, or 3 hours every week) 2. Increase social interactions - continue going to [place] and enjoy social gatherings with friends and family 3. Eat healthy, avoid fried foods and eat more fruits and vegetables 4. Maintain adequate blood pressure, blood sugar, and blood cholesterol level. Reducing the risk of stroke and cardiovascular disease also helps promoting better memory. 5. Avoid stressful situations. Live a simple life and avoid aggravations. Organize your time and prepare for the next day in anticipation. 6. Sleep well, avoid any interruptions of sleep and avoid any distractions in the bedroom that may interfere with adequate sleep quality 7. Avoid sugar, avoid sweets as there is a strong link between excessive sugar intake, diabetes, and cognitive impairment The Mediterranean diet has been shown to help patients reduce the risk of progressive memory disorders and reduces cardiovascular risk. This includes eating fish, eat fruits and green leafy vegetables, nuts like almonds and hazelnuts, walnuts, and also use olive oil. Avoid fast foods and fried foods as much as possible. Avoid sweets and sugar as sugar use has been linked to worsening of memory function.  There is always a concern of gradual progression of memory problems. If this is the case, then we may need to adjust level of care according to patient needs. Support, both to the patient and caregiver, should then be put into place.  Labs today [address]

## 2022-12-22 NOTE — Progress Notes (Signed)
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 Patient is currently on memantine 10 mg twice daily, tolerating well.  He is also compliant with his CPAP.  Prior MRI brain on May 2023 personally reviewed was remarkable for advanced cerebral atrophy with moderate chronic microvascular disease.  MMSE today is 20/30.  Daughter notices a decline in his cognitive status, however the patient also has not been involved in activities outside of the house, and there is a component of situational depression.  Patient does not wish to take any other medications or new medications other than the ones he is on.    Follow up in 6 months. Continue memantine 10 mg twice daily, side effects discussed (history of bradycardia, to avoid donepezil) Check B12 today Continue CPAP use for OSA Recommend good control of cardiovascular risk factors Continue to control mood as per PCP Discussed with daughter and patient regarding adult day programs, they agreed to look into it.    Subjective:    This patient is accompanied in the office by his daughter who supplements the history.  Previous records as well as any outside records available were reviewed prior to todays visit. Patient was last seen on 06/23/2022.  Last MoCA was 12/30 in February 2023.   Any changes in memory since last visit? "A little worse"-according to daughter.  Sometimes he forgets conversations, or forgets what he is doing.  He is daughter reports that he is not doing much, "sits around, does not leave the house, sometimes he works on his boat ".  Patient denies. repeats oneself?  Endorsed Disoriented when walking into a room?  Patient denies unless he is  out of the house sometimes he may forget where he is. Leaving objects in unusual places?  Sometimes he leaves the laundry in the clean section.   Wandering behavior? Denies  Any personality changes since last visit?  When talking about his memory, he may become defensive and argumentative.    Any  depression? he is more aware of his cognitive status. Hallucinations or paranoia?  denies   Seizures?    denies    Any sleep changes?  "He drinks too much caffeine throughout the day and then he cannot sleep at night, especially with tea ".  Denies vivid dreams, REM behavior or sleepwalking.   Sleep apnea?  He is compliant to his CPAP Any hygiene concerns?   As before, he does not want to bathe, he can go 2 weeks without doing so.   Independent of bathing and dressing?  Endorsed  Does the patient needs help with medications?  Daughter is in charge, filling the pillbox  Who is in charge of the finances?  Daughter is in charge     Any changes in appetite?  denies     Patient have trouble swallowing?  denies   Does the patient cook? Daughter does the cooking now, after leaving food in the microwave for up to 20 minutes  Any headaches?   denies   Chronic back pain  denies   Ambulates with difficulty?     denies   Recent falls or head injuries? denies     Unilateral weakness, numbness or tingling?  denies   Any tremors?  denies  Any anosmia?  Patient denies   Any incontinence of urine?  He wears depends but does note change it often Any bowel dysfunction?  Denies, he is "regular going every 2 to 3 days ". Patient lives  with daughter Does the patient drive? Only short distances, denies getting lost, but he is more concerned about driving right now when he goes to the store he comes back right away (short distance  History on Initial Assessment 12/21/2021: This is a pleasant 77 year old right-handed man with a history of hypertension, hyperlipidemia, MD, gout, CHF, s/p PPM, presenting for evaluation  of memory loss. He feels his memory is "so-so, not great but not that bad." His daughter [name] is present to provide additional history. [name] moved from [place] last August 2022 to help him after his cardiac issues last year. Since she moved in, she started noticing progressive memory changes. There is a lot of repetitive questions, same conversations. He gets the days confused and would not be able to keep up with his doctors appointments. He was previously managing his own medications and denies missing doses, [name] started to help in August, but recently started putting his medications in a pillbox because he was getting confused with the bottles. He remembers to take them but she has to remind him sometimes. His last HbA1c was 7.2. He was managing finances without difficulties, she now writes the checks and he signs them. He does not cook. He got lost driving around 3-4 months ago, he was 30 minutes away from home but was driving around for 3 hours and the police pulled him over. Glucose level at that time was 66. He has been driving very little since then. He denies misplacing things but loses the computer mouse or would disable the camera and [name] has to figure out what he had pressed when he says it is not his fault. He is mostly on his laptop during the day watching "junk." He would refuse to go outside with family or walk around, giving his pacemaker/cardiac issues as a reason even if he has been cleared for exercise by his cardiologist. He is able to dress himself. He does not bathe regularly, only when he has a doctors appointment. There is no paranoia or hallucinations, but he gets easily irritated when [name] tries to get him to do something. He reports sleep is good, but Diana shakes her head. He fell a week ago, he got lost in the house to use the bathroom, and fell down 6 steps. He denies any headaches, dizziness, diplopia, dysarthria/dysphagia, neck/back pain. He has occasional left hand  numbness and tingling. He has bowel and bladder incontinence and wears Depends, [name] also feels this causes him to isolate himself. He reports mood is good. [name] reports hearing loss, as well as decreased ability to smell and taste. He has mild hand tremors that do not affect daily activities. He is currently living with [name] and her boyfriend. His other daughter also lives in [place]. There is no clear family history of dementia, no significant head injuries. He stopped drinking alcohol since his heart issues started.   Laboratory Data:      Lab Results  Component Value Date    TSH 2.48 12/21/2021         Lab Results  Component Value Date    VITAMINB12 208 (L) 12/21/2021    PREVIOUS MEDICATIONS:   CURRENT MEDICATIONS:  Outpatient Encounter Medications as of 12/22/2022  Medication Sig   Alcohol Swabs (ALCOHOL  PREP) PADS Clean injection site before each injection (Patient taking differently: 1 each by Other route See admin instructions. Clean injection site before each injection)   allopurinol (ZYLOPRIM) 300 MG tablet take 1 tablet by mouth once a day to prevent gout   aspirin 81 MG tablet Take 81 mg by mouth daily.   atorvastatin (LIPITOR) 20 MG tablet TAKE 1 TABLET BY MOUTH  DAILY FOR CHOLESTEROL (Patient taking differently: Take 20 mg by mouth daily. TAKE 1 TABLET BY MOUTH  DAILY FOR CHOLESTEROL)   bisoprolol (ZEBETA) 10 MG tablet TAKE 1 TABLET BY MOUTH DAILY   Cholecalciferol (VITAMIN D PO) Take 5,000 Units by mouth daily.   Cyanocobalamin (B-12 COMPLIANCE INJECTION) 1000 MCG/ML KIT 1000 mcg daily for 1 week, then weekly for 1 month, then monthly for a year (Patient taking differently: Take 1,000 mcg by mouth daily. 1000 mcg daily for 1 week, then weekly for 1 month, then monthly for a year)   D-Mannose 500 MG CAPS Take 1,500 mg by mouth daily.   dapagliflozin propanediol (FARXIGA) 10 MG TABS tablet Take 1 tablet (10 mg total) by mouth daily.   ENTRESTO 97-103 MG TAKE 1 TABLET BY  MOUTH TWICE  DAILY   famotidine (PEPCID) 40 MG tablet Take 40 mg by mouth as needed for heartburn or indigestion.   furosemide (LASIX) 20 MG tablet Take 1 tablet (20 mg total) by mouth daily.   insulin glargine, 2 Unit Dial, (TOUJEO MAX SOLOSTAR) 300 UNIT/ML Solostar Pen Inject 78 Units into the skin daily.   Loratadine (CLARITIN PO) Take 1 tablet by mouth as needed (allergies). Unknown strength   magnesium oxide (MAG-OX) 400 MG tablet Take 400 mg by mouth daily.   memantine (NAMENDA) 10 MG tablet Take 1 tablet twice a day   metFORMIN (GLUCOPHAGE-XR) 500 MG 24 hr tablet Take 2 tablets (1,000 mg total) by mouth daily with breakfast. TAKE 2 TABLETS BY MOUTH  TWICE DAILY WITH MEALS FOR  DIABETES   sharps container 1 each by Does not apply route as needed.   spironolactone (ALDACTONE) 25 MG tablet Take 1 tablet (25 mg total) by mouth daily.   SYRINGE-NEEDLE, DISP, 3 ML 23G X 1" 3 ML MISC Use new syringe and needle for each injection (Patient taking differently: 1 each by Other route See admin instructions. Use new syringe and needle for each injection)   tamsulosin (FLOMAX) 0.4 MG CAPS capsule TAKE 1 CAPSULE BY MOUTH AT  BEDTIME FOR PROSTATE (Patient taking differently: Take 0.4 mg by mouth daily after supper. TAKE 1 CAPSULE BY MOUTH AT  BEDTIME FOR PROSTATE)   No facility-administered encounter medications on file as of 12/22/2022.       12/17/2019   11:14 AM  MMSE - Mini Mental State Exam  Orientation to time 5  Orientation to Place 5  Registration 3  Attention/ Calculation 5  Recall 3  Language- name 2 objects 2  Language- repeat 1  Language- follow 3 step command 3  Language- read & follow direction 1  Write a sentence 1  Copy design 1  Total score 30      12/21/2021    9:00 AM  Montreal Cognitive Assessment   Visuospatial/ Executive (0/5) 1  Naming (0/3) 2  Attention: Read list of digits (0/2) 0  Attention: Read list of letters (0/1) 0  Attention: Serial 7 subtraction starting  at 100 (0/3) 3  Language: Repeat phrase (0/2) 0  Language : Fluency (0/1) 0  Abstraction (0/2) 0  Delayed Recall (0/5) 0  Orientation (0/6) 5  Total 11  Adjusted Score (based on education) 12    Objective:     PHYSICAL EXAMINATION:    VITALS:   Vitals:   12/22/22 0912  Height: '5\' 5"'$  (1.651 m)    GEN:  The patient appears stated age and is in NAD. HEENT:  Normocephalic, atraumatic.   Neurological examination:  General: NAD, well-groomed, appears stated age. Orientation: The patient is alert. Oriented to person, place and date Cranial nerves: There is good facial symmetry.The speech is fluent and clear. No aphasia or dysarthria. Fund of knowledge is appropriate. Recent and remote memory are impaired. Attention and concentration are reduced.  Able to name objects and repeat phrases.  Hearing is intact to conversational tone.   Sensation: Sensation is intact to light touch throughout Motor: Strength is at least antigravity x4. DTR's 2/4 in UE/LE     Movement examination: Tone: There is normal tone in the UE/LE Abnormal movements:  no tremor.  No myoclonus.  No asterixis.   Coordination:  There is no decremation with RAM's. Normal finger to nose  Gait and Station: The patient has no difficulty arising out of a deep-seated chair without the use of the hands. The patient's stride length is good.  Gait is cautious and narrow.    Thank you for allowing [place] the opportunity to participate in the care of this nice patient. Please do not hesitate to contact us for any questions or concerns.   Total time spent on today's visit was 40 minutes dedicated to this patient today, preparing to see patient, examining the patient, ordering tests and/or medications and counseling the patient, documenting clinical information in the EHR or other health record, independently interpreting results and communicating results to the patient/family, discussing treatment and goals, answering patient's  questions and coordinating care.  Cc:  de [place], Raymond J, MD  Sharene Butters 12/22/2022 9:38 AM

## 2022-12-22 NOTE — Progress Notes (Signed)
B12 is normal, thanks

## 2023-01-02 ENCOUNTER — Telehealth (HOSPITAL_BASED_OUTPATIENT_CLINIC_OR_DEPARTMENT_OTHER): Payer: Self-pay | Admitting: Family Medicine

## 2023-01-02 NOTE — Telephone Encounter (Signed)
Contacted Brett [name] to schedule their annual wellness visit. Patient declined to schedule AWV at this time.  I spoke to patient's daughter, [name].  [name] works full time and if she's not there, patient won't answer phone.  Thank you,  [place] Direct dial  [phone]

## 2023-01-04 ENCOUNTER — Ambulatory Visit: Payer: Medicare Other | Admitting: Cardiology

## 2023-01-12 ENCOUNTER — Ambulatory Visit (INDEPENDENT_AMBULATORY_CARE_PROVIDER_SITE_OTHER): Payer: Medicare Other | Admitting: Family Medicine

## 2023-01-12 ENCOUNTER — Encounter (HOSPITAL_BASED_OUTPATIENT_CLINIC_OR_DEPARTMENT_OTHER): Payer: Self-pay | Admitting: Family Medicine

## 2023-01-12 VITALS — BP 131/69 | HR 66 | Ht 65.0 in | Wt 202.3 lb

## 2023-01-12 DIAGNOSIS — Z794 Long term (current) use of insulin: Secondary | ICD-10-CM

## 2023-01-12 DIAGNOSIS — N182 Chronic kidney disease, stage 2 (mild): Secondary | ICD-10-CM

## 2023-01-12 DIAGNOSIS — I158 Other secondary hypertension: Secondary | ICD-10-CM

## 2023-01-12 DIAGNOSIS — E1122 Type 2 diabetes mellitus with diabetic chronic kidney disease: Secondary | ICD-10-CM | POA: Diagnosis not present

## 2023-01-12 LAB — HEMOGLOBIN A1C
Est. average glucose Bld gHb Est-mCnc: 171 mg/dL
Hgb A1c MFr Bld: 7.6 % — ABNORMAL HIGH (ref 4.8–5.6)

## 2023-01-12 NOTE — Assessment & Plan Note (Signed)
Patient been doing well with current regimen he continues with metformin in the morning, Farxiga, insulin glargine 78 units daily.  Fasting blood sugars have generally been in the mid 100s.  No significant episodes of hypoglycemia noted.  Occasionally, if patient does sleep more during the day, he may have some low blood sugar readings at that time due to decreased p.o. intake. Most recent hemoglobin A1c showed good control at 6.6%.  He generally has had fairly good control of A1c over the past couple years.  Is due for recheck of hemoglobin A1c at this time We will proceed with labs today.  No changes to medication regimen currently.

## 2023-01-12 NOTE — Progress Notes (Signed)
    Procedures performed today:    None.  Independent interpretation of notes and tests performed by another provider:   None.  Brief History, Exam, Impression, and Recommendations:    BP 131/69 (BP Location: Right Arm, Patient Position: Sitting, Cuff Size: Large)   Pulse 66   Ht 5\' 5"  (1.651 m)   Wt 202 lb 4.8 oz (91.8 kg)   SpO2 98%   BMI 33.66 kg/m   Type 2 diabetes mellitus with stage 2 chronic kidney disease, with long-term current use of insulin ([place]) Patient been doing well with current regimen he continues with metformin in the morning, Farxiga, insulin glargine 78 units daily.  Fasting blood sugars have generally been in the mid 100s.  No significant episodes of hypoglycemia noted.  Occasionally, if patient does sleep more during the day, he may have some low blood sugar readings at that time due to decreased p.o. intake. Most recent hemoglobin A1c showed good control at 6.6%.  He generally has had fairly good control of A1c over the past couple years.  Is due for recheck of hemoglobin A1c at this time We will proceed with labs today.  No changes to medication regimen currently.  Hypertension Blood pressure appropriate in office today.  No recent changes to medication regimen.  Infrequent blood pressure checks at home.  No new issues with chest pain, headaches, visual changes. Given appropriate control of blood pressure, can continue with current medication regimen, no changes to be made today Recommend intermittent monitoring of blood pressure at home, DASH diet  Return in about 3 months (around 04/14/2023) for DM, HTN.   ___________________________________________ [name], MD, ABFM, [facility] and [place]

## 2023-01-12 NOTE — Assessment & Plan Note (Signed)
Blood pressure appropriate in office today.  No recent changes to medication regimen.  Infrequent blood pressure checks at home.  No new issues with chest pain, headaches, visual changes. Given appropriate control of blood pressure, can continue with current medication regimen, no changes to be made today Recommend intermittent monitoring of blood pressure at home, DASH diet

## 2023-01-14 ENCOUNTER — Other Ambulatory Visit: Payer: Self-pay | Admitting: Nurse Practitioner

## 2023-01-14 DIAGNOSIS — N138 Other obstructive and reflux uropathy: Secondary | ICD-10-CM

## 2023-01-17 ENCOUNTER — Encounter: Payer: Self-pay | Admitting: Cardiology

## 2023-01-17 MED ORDER — DAPAGLIFLOZIN PROPANEDIOL 10 MG PO TABS: 10.0000 mg | ORAL_TABLET | Freq: Every day | ORAL | 2 refills | Status: DC

## 2023-01-20 DIAGNOSIS — G4733 Obstructive sleep apnea (adult) (pediatric): Secondary | ICD-10-CM | POA: Diagnosis not present

## 2023-01-25 NOTE — Progress Notes (Signed)
Remote ICD transmission.   

## 2023-02-15 ENCOUNTER — Other Ambulatory Visit: Payer: Self-pay | Admitting: Nurse Practitioner

## 2023-02-15 ENCOUNTER — Other Ambulatory Visit: Payer: Self-pay | Admitting: Cardiology

## 2023-02-15 DIAGNOSIS — N138 Other obstructive and reflux uropathy: Secondary | ICD-10-CM

## 2023-02-18 DIAGNOSIS — E161 Other hypoglycemia: Secondary | ICD-10-CM | POA: Diagnosis not present

## 2023-02-18 DIAGNOSIS — I10 Essential (primary) hypertension: Secondary | ICD-10-CM | POA: Diagnosis not present

## 2023-02-21 ENCOUNTER — Other Ambulatory Visit (HOSPITAL_BASED_OUTPATIENT_CLINIC_OR_DEPARTMENT_OTHER): Payer: Self-pay | Admitting: Family Medicine

## 2023-02-21 DIAGNOSIS — I10 Essential (primary) hypertension: Secondary | ICD-10-CM

## 2023-03-06 ENCOUNTER — Other Ambulatory Visit: Payer: Self-pay

## 2023-03-06 ENCOUNTER — Encounter: Payer: Self-pay | Admitting: Cardiology

## 2023-03-06 ENCOUNTER — Ambulatory Visit: Payer: Medicare Other | Attending: Cardiology | Admitting: Cardiology

## 2023-03-06 VITALS — BP 126/84 | HR 66 | Ht 65.0 in | Wt 200.8 lb

## 2023-03-06 DIAGNOSIS — I420 Dilated cardiomyopathy: Secondary | ICD-10-CM | POA: Diagnosis not present

## 2023-03-06 DIAGNOSIS — E1122 Type 2 diabetes mellitus with diabetic chronic kidney disease: Secondary | ICD-10-CM

## 2023-03-06 DIAGNOSIS — G309 Alzheimer's disease, unspecified: Secondary | ICD-10-CM | POA: Diagnosis not present

## 2023-03-06 DIAGNOSIS — G4733 Obstructive sleep apnea (adult) (pediatric): Secondary | ICD-10-CM | POA: Diagnosis not present

## 2023-03-06 DIAGNOSIS — N182 Chronic kidney disease, stage 2 (mild): Secondary | ICD-10-CM | POA: Diagnosis not present

## 2023-03-06 DIAGNOSIS — Z7984 Long term (current) use of oral hypoglycemic drugs: Secondary | ICD-10-CM

## 2023-03-06 DIAGNOSIS — F0280 Dementia in other diseases classified elsewhere without behavioral disturbance: Secondary | ICD-10-CM

## 2023-03-06 DIAGNOSIS — I447 Left bundle-branch block, unspecified: Secondary | ICD-10-CM

## 2023-03-06 NOTE — Addendum Note (Signed)
Addended by: Roxanne Mins I on: 03/06/2023 08:31 AM   Modules accepted: Orders

## 2023-03-06 NOTE — Patient Instructions (Addendum)
Medication Instructions:  Your physician recommends that you continue on your current medications as directed. Please refer to the Current Medication list given to you today.  *If you need a refill on your cardiac medications before your next appointment, please call your pharmacy*   Lab Work: Your physician recommends that you return for lab work in:   Labs today in [address]: Lipids   If you have labs (blood work) drawn today and your tests are completely normal, you will receive your results only by: MyChart Message (if you have MyChart) OR A paper copy in the mail If you have any lab test that is abnormal or we need to change your treatment, we will call you to review the results.   Testing/Procedures: Your physician has requested that you have an echocardiogram. Echocardiography is a painless test that uses sound waves to create images of your heart. It provides your doctor with information about the size and shape of your heart and how well your heart's chambers and valves are working. This procedure takes approximately one hour. There are no restrictions for this procedure. Please do NOT wear cologne, perfume, aftershave, or lotions (deodorant is allowed). Please arrive 15 minutes prior to your appointment time.    Follow-Up: At [facility], you and your health needs are our priority.  As part of our continuing mission to provide you with exceptional heart care, we have created designated Provider Care Teams.  These Care Teams include your primary Cardiologist (physician) and Advanced Practice Providers (APPs -  Physician Assistants and Nurse Practitioners) who all work together to provide you with the care you need, when you need it.  We recommend signing up for the patient portal called "MyChart".  Sign up information is provided on this After Visit Summary.  MyChart is used to connect with patients for Virtual Visits (Telemedicine).  Patients are able to view lab/test  results, encounter notes, upcoming appointments, etc.  Non-urgent messages can be sent to your provider as well.   To learn more about what you can do with MyChart, go to [email or web address].    Your next appointment:   6 month(s)  Provider:   Gypsy Balsam, MD  Other Instructions Patient declined EKG chaperone

## 2023-03-06 NOTE — Progress Notes (Signed)
Cardiology Office Note:    Date:  03/06/2023   ID:  Brett [name], DOB 1945/11/26, MRN [id number]  PCP:  de [place], Buren Kos, MD  Cardiologist:  Gypsy Balsam, MD    Referring MD: de [place], Buren Kos, MD   Chief Complaint  Patient presents with   Follow-up    History of Present Illness:    Brett [name] is a 77 y.o. male  with past medical history significant for nonischemic cardiomyopathy with ejection fraction in the neighborhood of 30%, type 2 diabetes, hereditary hemochromatosis, essential hypertension, congestive heart failure. He has been follow-up by our advanced congestive heart failure clinic. He seems to be doing well. Work-up so far included cardiac catheterization June 2020 which showed only luminal disease, he also got a heart MRI which show extensive late gadolinium enhancement in the mid myocardium pattern in the basal septum and basal mid inferior wall. Finding on MRI with not consistent with hemochromatosis related cardiomyopathy, basically we know so far that this is not ischemic disease no hemochromatosis related. He is being put on guideline directed medical therapy seems to be responding well to the present management. He also received ICD which is Medtronic BiV device.  Comes today to my office like always with his granddaughter.  He seems to be cheerful and doing well denies have any chest pain tightness squeezing pressure burning chest only minimal swelling of lower extremities in the evening time, no paroxysmal nocturnal dyspnea overall seems to be doing quite well  Past Medical History:  Diagnosis Date   Acid reflux    Allergy    Colon polyp    Diabetes mellitus without complication (HCC)    Dilated cardiomyopathy (HCC)    non isch   Gout    Gout    Hemochromatosis    Hypertension    OSA (obstructive sleep apnea)    Urine frequency     Past Surgical History:  Procedure Laterality Date   BIV ICD INSERTION CRT-D N/A 09/13/2021   Procedure: BIV ICD  INSERTION CRT-D;  Surgeon: Regan Lemming, MD;  Location: [facility] INVASIVE CV LAB;  Service: Cardiovascular;  Laterality: N/A;   EYE SURGERY Bilateral    Cataract   RIGHT/LEFT HEART CATH AND CORONARY ANGIOGRAPHY N/A 03/29/2021   Procedure: RIGHT/LEFT HEART CATH AND CORONARY ANGIOGRAPHY;  Surgeon: Elder Negus, MD;  Location: [facility] INVASIVE CV LAB;  Service: Cardiovascular;  Laterality: N/A;    Current Medications: Current Meds  Medication Sig   Alcohol Swabs (ALCOHOL PREP) PADS Clean injection site before each injection (Patient taking differently: 1 each by Other route See admin instructions. Clean injection site before each injection)   allopurinol (ZYLOPRIM) 300 MG tablet take 1 tablet by mouth once a day to prevent gout (Patient taking differently: Take 300 mg by mouth daily. take 1 tablet by mouth once a day to prevent gout)   aspirin 81 MG tablet Take 81 mg by mouth daily.   atorvastatin (LIPITOR) 20 MG tablet TAKE 1 TABLET BY MOUTH DAILY FOR CHOLESTEROL (Patient taking differently: Take by mouth daily. TAKE 1 TABLET BY MOUTH  DAILY FOR CHOLESTEROL)   bisoprolol (ZEBETA) 10 MG tablet TAKE 1 TABLET BY MOUTH DAILY   Cholecalciferol (VITAMIN D PO) Take 5,000 Units by mouth daily.   Cyanocobalamin (B-12 COMPLIANCE INJECTION) 1000 MCG/ML KIT 1000 mcg daily for 1 week, then weekly for 1 month, then monthly for a year (Patient taking differently: Take 1,000 mcg by mouth daily. 1000 mcg daily for 1  week, then weekly for 1 month, then monthly for a year)   D-Mannose 500 MG CAPS Take 1,500 mg by mouth daily.   dapagliflozin propanediol (FARXIGA) 10 MG TABS tablet Take 1 tablet (10 mg total) by mouth daily.   famotidine (PEPCID) 40 MG tablet Take 40 mg by mouth as needed for heartburn or indigestion.   furosemide (LASIX) 20 MG tablet Take 1 tablet (20 mg total) by mouth daily.   insulin glargine, 2 Unit Dial, (TOUJEO MAX SOLOSTAR) 300 UNIT/ML Solostar Pen Inject 78 Units into the skin daily.    Loratadine (CLARITIN PO) Take 1 tablet by mouth as needed (allergies). Unknown strength   magnesium oxide (MAG-OX) 400 MG tablet Take 400 mg by mouth daily.   memantine (NAMENDA) 10 MG tablet Take 1 tablet twice a day (Patient taking differently: Take 10 mg by mouth 2 (two) times daily. Take 1 tablet twice a day)   metFORMIN (GLUCOPHAGE-XR) 500 MG 24 hr tablet Take 2 tablets (1,000 mg total) by mouth daily with breakfast. TAKE 2 TABLETS BY MOUTH  TWICE DAILY WITH MEALS FOR  DIABETES   sacubitril-valsartan (ENTRESTO) 97-103 MG Take 1 tablet by mouth 2 (two) times daily. NEEDS FOLLOW UP APPOINTMENT FOR MORE REFILLS   sharps container 1 each by Does not apply route as needed. (Patient taking differently: 1 each by Does not apply route as needed (Dispose of needles).)   spironolactone (ALDACTONE) 25 MG tablet Take 1 tablet (25 mg total) by mouth daily.   SYRINGE-NEEDLE, DISP, 3 ML 23G X 1" 3 ML MISC Use new syringe and needle for each injection (Patient taking differently: 1 each by Other route See admin instructions. Use new syringe and needle for each injection)   tamsulosin (FLOMAX) 0.4 MG CAPS capsule TAKE 1 CAPSULE BY MOUTH AT  BEDTIME FOR PROSTATE (Patient taking differently: Take 0.4 mg by mouth daily after supper. TAKE 1 CAPSULE BY MOUTH AT  BEDTIME FOR PROSTATE)     Allergies:   Patient has no known allergies.   Social History   Socioeconomic History   Marital status: Divorced    Spouse name: Not on file   Number of children: Not on file   Years of education: Not on file   Highest education level: Not on file  Occupational History   Not on file  Tobacco Use   Smoking status: Former    Packs/day: 3.00    Years: 19.00    Additional pack years: 0.00    Total pack years: 57.00    Types: Cigarettes    Start date: 10/24/1957    Quit date: 11/24/1976    Years since quitting: 46.3    Passive exposure: Past   Smokeless tobacco: Never  Vaping Use   Vaping Use: Never used  Substance and  Sexual Activity   Alcohol use: Yes    Comment: Occasionally   Drug use: No   Sexual activity: Not on file  Other Topics Concern   Not on file  Social History Narrative   Pt lives w/ ex wife   Right handed    Retired   Caffeine occasionally   Social Determinants of Health   Financial Resource Strain: Not on file  Food Insecurity: Not on file  Transportation Needs: No Transportation Needs (05/19/2022)   PRAPARE - Administrator, Civil Service (Medical): No    Lack of Transportation (Non-Medical): No  Physical Activity: Not on file  Stress: Not on file  Social Connections: Not on file  Family History: The patient's family history includes Cancer in his maternal uncle; Diabetes in his brother, father, and another family member; Heart attack in his brother; Heart disease in an other family member; Hyperlipidemia in an other family member; Hypertension in an other family member. ROS:   Please see the history of present illness.    All 14 point review of systems negative except as described per history of present illness  EKGs/Labs/Other Studies Reviewed:      Recent Labs: 06/30/2022: BUN 25; Creatinine, Ser 1.12; Potassium 4.2; Sodium 141 09/09/2022: Hemoglobin 16.2; Platelet Count 186  Recent Lipid Panel    Component Value Date/Time   CHOL 111 03/26/2021 0326   TRIG 42 03/26/2021 0326   HDL 39 (L) 03/26/2021 0326   CHOLHDL 2.8 03/26/2021 0326   VLDL 8 03/26/2021 0326   LDLCALC 64 03/26/2021 0326   LDLCALC 56 07/06/2020 1429    Physical Exam:    VS:  BP 126/84 (BP Location: Left Arm, Patient Position: Sitting)   Pulse 66   Ht 5\' 5"  (1.651 m)   Wt 200 lb 12.8 oz (91.1 kg)   SpO2 97%   BMI 33.41 kg/m     Wt Readings from Last 3 Encounters:  03/06/23 200 lb 12.8 oz (91.1 kg)  01/12/23 202 lb 4.8 oz (91.8 kg)  11/24/22 205 lb 3.2 oz (93.1 kg)     GEN:  Well nourished, well developed in no acute distress HEENT: Normal NECK: No JVD; No carotid  bruits LYMPHATICS: No lymphadenopathy CARDIAC: RRR, no murmurs, no rubs, no gallops RESPIRATORY:  Clear to auscultation without rales, wheezing or rhonchi  ABDOMEN: Soft, non-tender, non-distended MUSCULOSKELETAL:  No edema; No deformity  SKIN: Warm and dry LOWER EXTREMITIES: no swelling NEUROLOGIC:  Alert and oriented x 3 PSYCHIATRIC:  Normal affect   ASSESSMENT:    1. Dilated cardiomyopathy (HCC)   2. LBBB (left bundle branch block)   3. OSA (obstructive sleep apnea)   4. CKD stage 2 due to type 2 diabetes mellitus (HCC)   5. Alzheimer's dementia without behavioral disturbance (HCC)    PLAN:    In order of problems listed above:  History of cardiomyopathy which appears to be nonischemic.  I will repeat his echocardiogram to recheck left ventricle ejection fraction, he is on guideline directed medical therapy.  Seems to be compensated on physical exam. Left bundle branch show he does have defibrillator which is Medtronic BiV device. Obstructive sleep apnea that being followed by internal medicine team. Chronic kidney failure I do have any recent Chem-7 I see 1 from June 30, 2022 which looks good. Diabetes that being followed by internal medicine team, his hemoglobin A1c from January 12, 2023 is 7.6 obviously need to be slightly better controlled. Dyslipidemia I do have data from 2022 with LDL of 64 HDL 39, will schedule him to have fasting lipid profile today. Defibrillator I did review interrogation normal function   Medication Adjustments/Labs and Tests Ordered: Current medicines are reviewed at length with the patient today.  Concerns regarding medicines are outlined above.  No orders of the defined types were placed in this encounter.  Medication changes: No orders of the defined types were placed in this encounter.   Signed, Georgeanna Lea, MD, [facility] 03/06/2023 8:19 AM    [facility]

## 2023-03-07 LAB — LIPID PANEL
Chol/HDL Ratio: 3.1 ratio (ref 0.0–5.0)
Cholesterol, Total: 113 mg/dL (ref 100–199)
HDL: 36 mg/dL — ABNORMAL LOW (ref >39)
LDL Chol Calc (NIH): 61 mg/dL (ref 0–99)
Triglycerides: 80 mg/dL (ref 0–149)
VLDL Cholesterol Cal: 16 mg/dL (ref 5–40)

## 2023-03-09 ENCOUNTER — Other Ambulatory Visit: Payer: Self-pay | Admitting: Nurse Practitioner

## 2023-03-09 DIAGNOSIS — N138 Other obstructive and reflux uropathy: Secondary | ICD-10-CM

## 2023-03-13 ENCOUNTER — Ambulatory Visit (INDEPENDENT_AMBULATORY_CARE_PROVIDER_SITE_OTHER): Payer: PRIVATE HEALTH INSURANCE

## 2023-03-13 DIAGNOSIS — I420 Dilated cardiomyopathy: Secondary | ICD-10-CM | POA: Diagnosis not present

## 2023-03-13 LAB — CUP PACEART REMOTE DEVICE CHECK
Battery Remaining Longevity: 90 mo
Battery Voltage: 2.99 V
Brady Statistic AP VP Percent: 40.5 %
Brady Statistic AP VS Percent: 0.67 %
Brady Statistic AS VP Percent: 56.76 %
Brady Statistic AS VS Percent: 2.07 %
Brady Statistic RA Percent Paced: 38.89 %
Brady Statistic RV Percent Paced: 33 %
Date Time Interrogation Session: 20240520033524
HighPow Impedance: 57 Ohm
Implantable Lead Connection Status: 753985
Implantable Lead Connection Status: 753985
Implantable Lead Connection Status: 753985
Implantable Lead Implant Date: 20221121
Implantable Lead Implant Date: 20221121
Implantable Lead Implant Date: 20221121
Implantable Lead Location: 753858
Implantable Lead Location: 753859
Implantable Lead Location: 753860
Implantable Lead Model: 4598
Implantable Lead Model: 5076
Implantable Pulse Generator Implant Date: 20221121
Lead Channel Impedance Value: 141.867
Lead Channel Impedance Value: 141.867
Lead Channel Impedance Value: 149.625
Lead Channel Impedance Value: 160.941
Lead Channel Impedance Value: 160.941
Lead Channel Impedance Value: 266 Ohm
Lead Channel Impedance Value: 304 Ohm
Lead Channel Impedance Value: 304 Ohm
Lead Channel Impedance Value: 304 Ohm
Lead Channel Impedance Value: 342 Ohm
Lead Channel Impedance Value: 380 Ohm
Lead Channel Impedance Value: 399 Ohm
Lead Channel Impedance Value: 399 Ohm
Lead Channel Impedance Value: 437 Ohm
Lead Channel Impedance Value: 456 Ohm
Lead Channel Impedance Value: 532 Ohm
Lead Channel Impedance Value: 570 Ohm
Lead Channel Impedance Value: 589 Ohm
Lead Channel Pacing Threshold Amplitude: 0.875 V
Lead Channel Pacing Threshold Amplitude: 0.875 V
Lead Channel Pacing Threshold Amplitude: 1 V
Lead Channel Pacing Threshold Pulse Width: 0.4 ms
Lead Channel Pacing Threshold Pulse Width: 0.4 ms
Lead Channel Pacing Threshold Pulse Width: 0.4 ms
Lead Channel Sensing Intrinsic Amplitude: 1.875 mV
Lead Channel Sensing Intrinsic Amplitude: 1.875 mV
Lead Channel Sensing Intrinsic Amplitude: 17.5 mV
Lead Channel Sensing Intrinsic Amplitude: 17.5 mV
Lead Channel Setting Pacing Amplitude: 1.5 V
Lead Channel Setting Pacing Amplitude: 1.5 V
Lead Channel Setting Pacing Amplitude: 2 V
Lead Channel Setting Pacing Pulse Width: 0.4 ms
Lead Channel Setting Pacing Pulse Width: 0.4 ms
Lead Channel Setting Sensing Sensitivity: 0.3 mV
Zone Setting Status: 755011
Zone Setting Status: 755011

## 2023-03-30 ENCOUNTER — Ambulatory Visit (HOSPITAL_BASED_OUTPATIENT_CLINIC_OR_DEPARTMENT_OTHER)
Admission: RE | Admit: 2023-03-30 | Discharge: 2023-03-30 | Disposition: A | Discharge status: Home / Self Care | Payer: Medicare Other | Source: Ambulatory Visit | Attending: Cardiology | Admitting: Cardiology

## 2023-03-30 DIAGNOSIS — I420 Dilated cardiomyopathy: Secondary | ICD-10-CM | POA: Insufficient documentation

## 2023-03-30 DIAGNOSIS — N182 Chronic kidney disease, stage 2 (mild): Secondary | ICD-10-CM

## 2023-03-30 DIAGNOSIS — G4733 Obstructive sleep apnea (adult) (pediatric): Secondary | ICD-10-CM | POA: Diagnosis not present

## 2023-03-30 DIAGNOSIS — I447 Left bundle-branch block, unspecified: Secondary | ICD-10-CM

## 2023-03-30 DIAGNOSIS — G309 Alzheimer's disease, unspecified: Secondary | ICD-10-CM | POA: Insufficient documentation

## 2023-03-30 DIAGNOSIS — F0280 Dementia in other diseases classified elsewhere without behavioral disturbance: Secondary | ICD-10-CM | POA: Insufficient documentation

## 2023-03-30 DIAGNOSIS — E1122 Type 2 diabetes mellitus with diabetic chronic kidney disease: Secondary | ICD-10-CM | POA: Diagnosis not present

## 2023-03-30 LAB — ECHOCARDIOGRAM COMPLETE
Area-P 1/2: 1.89 cm2
Calc EF: 44.5 %
S' Lateral: 4.9 cm
Single Plane A2C EF: 47.3 %
Single Plane A4C EF: 41.5 %

## 2023-04-03 ENCOUNTER — Encounter (INDEPENDENT_AMBULATORY_CARE_PROVIDER_SITE_OTHER): Payer: Medicare Other | Admitting: Ophthalmology

## 2023-04-03 DIAGNOSIS — I10 Essential (primary) hypertension: Secondary | ICD-10-CM | POA: Diagnosis not present

## 2023-04-03 DIAGNOSIS — Z7984 Long term (current) use of oral hypoglycemic drugs: Secondary | ICD-10-CM | POA: Diagnosis not present

## 2023-04-03 DIAGNOSIS — H35033 Hypertensive retinopathy, bilateral: Secondary | ICD-10-CM | POA: Diagnosis not present

## 2023-04-03 DIAGNOSIS — H43813 Vitreous degeneration, bilateral: Secondary | ICD-10-CM | POA: Diagnosis not present

## 2023-04-03 DIAGNOSIS — H35373 Puckering of macula, bilateral: Secondary | ICD-10-CM

## 2023-04-03 DIAGNOSIS — E113393 Type 2 diabetes mellitus with moderate nonproliferative diabetic retinopathy without macular edema, bilateral: Secondary | ICD-10-CM | POA: Diagnosis not present

## 2023-04-06 ENCOUNTER — Ambulatory Visit (HOSPITAL_BASED_OUTPATIENT_CLINIC_OR_DEPARTMENT_OTHER): Payer: Medicare Other | Admitting: Family Medicine

## 2023-04-10 ENCOUNTER — Telehealth: Payer: Self-pay

## 2023-04-10 NOTE — Telephone Encounter (Signed)
Left message on My Chart with normal ECHO results per Dr. Krasowski's note. Routed to PCP.  

## 2023-04-11 NOTE — Progress Notes (Signed)
Remote ICD transmission.   

## 2023-04-19 ENCOUNTER — Other Ambulatory Visit: Payer: Self-pay | Admitting: Nurse Practitioner

## 2023-04-19 DIAGNOSIS — N138 Other obstructive and reflux uropathy: Secondary | ICD-10-CM

## 2023-04-21 DIAGNOSIS — G4733 Obstructive sleep apnea (adult) (pediatric): Secondary | ICD-10-CM | POA: Diagnosis not present

## 2023-05-17 ENCOUNTER — Other Ambulatory Visit (HOSPITAL_COMMUNITY): Payer: Self-pay | Admitting: Adult Health

## 2023-05-18 ENCOUNTER — Other Ambulatory Visit (HOSPITAL_BASED_OUTPATIENT_CLINIC_OR_DEPARTMENT_OTHER): Payer: Self-pay | Admitting: Family Medicine

## 2023-05-18 ENCOUNTER — Other Ambulatory Visit: Payer: Self-pay | Admitting: Neurology

## 2023-05-18 ENCOUNTER — Other Ambulatory Visit: Payer: Self-pay | Admitting: Cardiology

## 2023-05-18 DIAGNOSIS — E1122 Type 2 diabetes mellitus with diabetic chronic kidney disease: Secondary | ICD-10-CM

## 2023-05-20 ENCOUNTER — Other Ambulatory Visit: Payer: Self-pay | Admitting: Cardiology

## 2023-06-12 ENCOUNTER — Ambulatory Visit (INDEPENDENT_AMBULATORY_CARE_PROVIDER_SITE_OTHER): Payer: PRIVATE HEALTH INSURANCE

## 2023-06-12 DIAGNOSIS — I420 Dilated cardiomyopathy: Secondary | ICD-10-CM | POA: Diagnosis not present

## 2023-06-13 LAB — CUP PACEART REMOTE DEVICE CHECK
Battery Remaining Longevity: 86 mo
Battery Voltage: 2.99 V
Brady Statistic AP VP Percent: 35.75 %
Brady Statistic AP VS Percent: 0.54 %
Brady Statistic AS VP Percent: 61.72 %
Brady Statistic AS VS Percent: 1.99 %
Brady Statistic RA Percent Paced: 34.75 %
Brady Statistic RV Percent Paced: 37.04 %
Date Time Interrogation Session: 20240819043724
HighPow Impedance: 63 Ohm
Implantable Lead Connection Status: 753985
Implantable Lead Connection Status: 753985
Implantable Lead Connection Status: 753985
Implantable Lead Implant Date: 20221121
Implantable Lead Implant Date: 20221121
Implantable Lead Implant Date: 20221121
Implantable Lead Location: 753858
Implantable Lead Location: 753859
Implantable Lead Location: 753860
Implantable Lead Model: 4598
Implantable Lead Model: 5076
Implantable Pulse Generator Implant Date: 20221121
Lead Channel Impedance Value: 136.276
Lead Channel Impedance Value: 136.276
Lead Channel Impedance Value: 152.559
Lead Channel Impedance Value: 172.541
Lead Channel Impedance Value: 172.541
Lead Channel Impedance Value: 247 Ohm
Lead Channel Impedance Value: 304 Ohm
Lead Channel Impedance Value: 304 Ohm
Lead Channel Impedance Value: 323 Ohm
Lead Channel Impedance Value: 380 Ohm
Lead Channel Impedance Value: 399 Ohm
Lead Channel Impedance Value: 399 Ohm
Lead Channel Impedance Value: 399 Ohm
Lead Channel Impedance Value: 456 Ohm
Lead Channel Impedance Value: 494 Ohm
Lead Channel Impedance Value: 513 Ohm
Lead Channel Impedance Value: 570 Ohm
Lead Channel Impedance Value: 570 Ohm
Lead Channel Pacing Threshold Amplitude: 0.75 V
Lead Channel Pacing Threshold Amplitude: 0.875 V
Lead Channel Pacing Threshold Amplitude: 0.875 V
Lead Channel Pacing Threshold Pulse Width: 0.4 ms
Lead Channel Pacing Threshold Pulse Width: 0.4 ms
Lead Channel Pacing Threshold Pulse Width: 0.4 ms
Lead Channel Sensing Intrinsic Amplitude: 1.125 mV
Lead Channel Sensing Intrinsic Amplitude: 1.125 mV
Lead Channel Sensing Intrinsic Amplitude: 19.75 mV
Lead Channel Sensing Intrinsic Amplitude: 19.75 mV
Lead Channel Setting Pacing Amplitude: 1.5 V
Lead Channel Setting Pacing Amplitude: 1.5 V
Lead Channel Setting Pacing Amplitude: 2 V
Lead Channel Setting Pacing Pulse Width: 0.4 ms
Lead Channel Setting Pacing Pulse Width: 0.4 ms
Lead Channel Setting Sensing Sensitivity: 0.3 mV
Zone Setting Status: 755011
Zone Setting Status: 755011

## 2023-06-22 ENCOUNTER — Ambulatory Visit: Payer: Medicare Other | Admitting: Physician Assistant

## 2023-06-22 NOTE — Progress Notes (Signed)
Remote ICD transmission.   

## 2023-06-27 ENCOUNTER — Other Ambulatory Visit: Payer: Self-pay | Admitting: Cardiology

## 2023-07-02 ENCOUNTER — Other Ambulatory Visit: Payer: Self-pay | Admitting: Physician Assistant

## 2023-07-02 ENCOUNTER — Other Ambulatory Visit (HOSPITAL_COMMUNITY): Payer: Self-pay | Admitting: Adult Health

## 2023-07-05 ENCOUNTER — Telehealth: Payer: Self-pay | Admitting: *Deleted

## 2023-07-05 NOTE — Progress Notes (Signed)
  Care Coordination  Outreach Note  07/05/2023 Name: Brett [name] MRN: [id number] DOB: Sep 13, 1946   Care Coordination Outreach Attempts: An unsuccessful telephone outreach was attempted today to offer the patient information about available care coordination services.  Follow Up Plan:  Additional outreach attempts will be made to offer the patient care coordination information and services.   Encounter Outcome:  No Answer  Gwenevere Ghazi  Care Coordination Care Guide  Direct Dial: [phone]

## 2023-07-08 ENCOUNTER — Other Ambulatory Visit (HOSPITAL_BASED_OUTPATIENT_CLINIC_OR_DEPARTMENT_OTHER): Payer: Self-pay | Admitting: Family Medicine

## 2023-07-08 DIAGNOSIS — R32 Unspecified urinary incontinence: Secondary | ICD-10-CM

## 2023-07-11 NOTE — Progress Notes (Signed)
Care Coordination   Note   07/11/2023 Name: Brett [name] MRN: [id number] DOB: 1946/02/26  [name] is a 77 y.o. year old male who sees de [place], Buren Kos, MD for primary care. I reached out to Demarques J Kregel by phone today to offer care coordination services.  [name] was given information about Care Coordination services today including:   The Care Coordination services include support from the care team which includes your Nurse Coordinator, Clinical Social Worker, or Pharmacist.  The Care Coordination team is here to help remove barriers to the health concerns and goals most important to you. Care Coordination services are voluntary, and the patient may decline or stop services at any time by request to their care team member.   Care Coordination Consent Status: Patient did not agree to participate in care coordination services at this time.   Encounter Outcome:  Larkey,Diana Daughter declined services for patient at this time. Ask for patient to reach back out to de [place], Buren Kos, MD office to reschedule follow up with primary care that was canceled in June  [facility] Coordination Care Guide  Direct Dial: [phone]

## 2023-07-27 ENCOUNTER — Other Ambulatory Visit (HOSPITAL_BASED_OUTPATIENT_CLINIC_OR_DEPARTMENT_OTHER): Payer: Self-pay

## 2023-07-27 ENCOUNTER — Ambulatory Visit (INDEPENDENT_AMBULATORY_CARE_PROVIDER_SITE_OTHER): Payer: Medicare Other | Admitting: Family Medicine

## 2023-07-27 ENCOUNTER — Encounter (HOSPITAL_BASED_OUTPATIENT_CLINIC_OR_DEPARTMENT_OTHER): Payer: Self-pay | Admitting: Family Medicine

## 2023-07-27 ENCOUNTER — Encounter (HOSPITAL_BASED_OUTPATIENT_CLINIC_OR_DEPARTMENT_OTHER): Payer: Self-pay | Admitting: *Deleted

## 2023-07-27 VITALS — BP 119/67 | HR 63 | Ht 63.0 in | Wt 202.0 lb

## 2023-07-27 DIAGNOSIS — H9193 Unspecified hearing loss, bilateral: Secondary | ICD-10-CM | POA: Diagnosis not present

## 2023-07-27 DIAGNOSIS — H9190 Unspecified hearing loss, unspecified ear: Secondary | ICD-10-CM | POA: Insufficient documentation

## 2023-07-27 DIAGNOSIS — Z23 Encounter for immunization: Secondary | ICD-10-CM | POA: Diagnosis not present

## 2023-07-27 DIAGNOSIS — E1122 Type 2 diabetes mellitus with diabetic chronic kidney disease: Secondary | ICD-10-CM

## 2023-07-27 DIAGNOSIS — Z794 Long term (current) use of insulin: Secondary | ICD-10-CM

## 2023-07-27 DIAGNOSIS — R194 Change in bowel habit: Secondary | ICD-10-CM

## 2023-07-27 DIAGNOSIS — N182 Chronic kidney disease, stage 2 (mild): Secondary | ICD-10-CM

## 2023-07-27 MED ORDER — COVID-19 MRNA VAC-TRIS(PFIZER) 30 MCG/0.3ML IM SUSY
0.3000 mL | PREFILLED_SYRINGE | Freq: Once | INTRAMUSCULAR | 0 refills | Status: AC
  Filled 2023-07-27: qty 0.3, 1d supply, fill #0

## 2023-07-27 MED ORDER — TOUJEO MAX SOLOSTAR 300 UNIT/ML ~~LOC~~ SOPN
76.0000 [IU] | PEN_INJECTOR | Freq: Every day | SUBCUTANEOUS | Status: DC
Start: 2023-07-27 — End: 2023-09-14

## 2023-07-27 NOTE — Progress Notes (Signed)
    Procedures performed today:    None.  Independent interpretation of notes and tests performed by another provider:   None.  Brief History, Exam, Impression, and Recommendations:    BP 119/67 (BP Location: Right Arm, Patient Position: Sitting, Cuff Size: Normal)   Pulse 63   Ht 5\' 3"  (1.6 m)   Wt 202 lb (91.6 kg)   SpO2 97%   BMI 35.78 kg/m   Decreased hearing of both ears Assessment & Plan: Patient and family also report decreased hearing.  Daughter has noticed that patient has had 2 turn the volume up on the TV quite loud in order to be able to hear this.  Decreased hearing is reported to be bilateral, do not feel that 1 ear is more effective than the other.  No associated pain. We will proceed with referral to audiology for further evaluation and testing.  We will await results of audiometry to determine next steps  Orders: -     Ambulatory referral to Audiology  Type 2 diabetes mellitus with stage 2 chronic kidney disease, with long-term current use of insulin (HCC) Assessment & Plan: Patient has been doing well with current regimen he continues with metformin in the morning, Farxiga, insulin glargine 76 units daily.  Fasting blood sugars have generally been controlled reportedly, do not have specific numbers to review.  No significant episodes of hypoglycemia noted.  Most recent hemoglobin A1c showed borderline control at 7.6 %.  He generally has had fairly good control of A1c over the past couple years.  Is due for recheck of hemoglobin A1c at this time.  Also due for nephropathy screening We will proceed with labs today.  No changes to medication regimen currently.  Orders: -     Hemoglobin A1c -     Microalbumin / creatinine urine ratio -     Basic metabolic panel -     Toujeo Max SoloStar; Inject 76 Units into the skin daily.  Change in bowel habit Assessment & Plan: Patient continues to have issues with bowel urgency.  We previously did place referral to  gastroenterology, however it appears that patient did not complete referral in the past.  Given continued issues, recommend evaluation with gastroenterologist, new referral placed today  Orders: -     Ambulatory referral to Gastroenterology  Encounter for immunization -     Flu Vaccine Trivalent High Dose (Fluad)  Return in about 3 months (around 10/27/2023).   ___________________________________________ Araseli Sherry de [place], MD, ABFM, CAQSM Primary Care and Sports Medicine [facility]

## 2023-07-27 NOTE — Assessment & Plan Note (Signed)
Patient and family also report decreased hearing.  Daughter has noticed that patient has had 2 turn the volume up on the TV quite loud in order to be able to hear this.  Decreased hearing is reported to be bilateral, do not feel that 1 ear is more effective than the other.  No associated pain. We will proceed with referral to audiology for further evaluation and testing.  We will await results of audiometry to determine next steps

## 2023-07-27 NOTE — Assessment & Plan Note (Signed)
Patient continues to have issues with bowel urgency.  We previously did place referral to gastroenterology, however it appears that patient did not complete referral in the past.  Given continued issues, recommend evaluation with gastroenterologist, new referral placed today

## 2023-07-27 NOTE — Assessment & Plan Note (Addendum)
Patient has been doing well with current regimen he continues with metformin in the morning, Farxiga, insulin glargine 76 units daily.  Fasting blood sugars have generally been controlled reportedly, do not have specific numbers to review.  No significant episodes of hypoglycemia noted.  Most recent hemoglobin A1c showed borderline control at 7.6 %.  He generally has had fairly good control of A1c over the past couple years.  Is due for recheck of hemoglobin A1c at this time.  Also due for nephropathy screening We will proceed with labs today.  No changes to medication regimen currently.

## 2023-07-28 LAB — HEMOGLOBIN A1C
Est. average glucose Bld gHb Est-mCnc: 180 mg/dL
Hgb A1c MFr Bld: 7.9 % — ABNORMAL HIGH (ref 4.8–5.6)

## 2023-07-28 LAB — BASIC METABOLIC PANEL
BUN/Creatinine Ratio: 19 (ref 10–24)
BUN: 24 mg/dL (ref 8–27)
CO2: 23 mmol/L (ref 20–29)
Calcium: 9.6 mg/dL (ref 8.6–10.2)
Chloride: 100 mmol/L (ref 96–106)
Creatinine, Ser: 1.24 mg/dL (ref 0.76–1.27)
Glucose: 185 mg/dL — ABNORMAL HIGH (ref 70–99)
Potassium: 4.2 mmol/L (ref 3.5–5.2)
Sodium: 139 mmol/L (ref 134–144)
eGFR: 60 mL/min/{1.73_m2} (ref >59)

## 2023-07-28 LAB — MICROALBUMIN / CREATININE URINE RATIO
Creatinine, Urine: 73.1 mg/dL
Microalb/Creat Ratio: 4 mg/g{creat} (ref 0–29)
Microalbumin, Urine: 3.2 ug/mL

## 2023-07-31 ENCOUNTER — Encounter (HOSPITAL_BASED_OUTPATIENT_CLINIC_OR_DEPARTMENT_OTHER): Payer: Self-pay | Admitting: *Deleted

## 2023-08-03 ENCOUNTER — Encounter: Payer: Self-pay | Admitting: Physician Assistant

## 2023-08-03 ENCOUNTER — Ambulatory Visit (INDEPENDENT_AMBULATORY_CARE_PROVIDER_SITE_OTHER): Payer: Medicare Other | Admitting: Physician Assistant

## 2023-08-03 VITALS — BP 162/98 | HR 71 | Resp 18 | Ht 63.0 in | Wt 201.0 lb

## 2023-08-03 DIAGNOSIS — F0280 Dementia in other diseases classified elsewhere without behavioral disturbance: Secondary | ICD-10-CM | POA: Diagnosis not present

## 2023-08-03 DIAGNOSIS — G309 Alzheimer's disease, unspecified: Secondary | ICD-10-CM

## 2023-08-03 MED ORDER — MEMANTINE HCL 10 MG PO TABS: ORAL_TABLET | ORAL | 3 refills | Status: DC

## 2023-08-03 NOTE — Patient Instructions (Signed)
Good to see you.  Continue Memantine 10mg  1 tablet twice a day  2. Continue to monitor driving  3. Recommend looking into joining day programs or exercise programs  4. Follow-up in 6 months     FALL PRECAUTIONS: Be cautious when walking. Scan the area for obstacles that may increase the risk of trips and falls. When getting up in the mornings, sit up at the edge of the bed for a few minutes before getting out of bed. Consider elevating the bed at the head end to avoid drop of blood pressure when getting up. Walk always in a well-lit room (use night lights in the walls). Avoid area rugs or power cords from appliances in the middle of the walkways. Use a walker or a cane if necessary and consider physical therapy for balance exercise. Get your eyesight checked regularly.  FINANCIAL OVERSIGHT: Supervision, especially oversight when making financial decisions or transactions is also recommended.  HOME SAFETY: Consider the safety of the kitchen when operating appliances like stoves, microwave oven, and blender. Consider having supervision and share cooking responsibilities until no longer able to participate in those. Accidents with firearms and other hazards in the house should be identified and addressed as well.  DRIVING: Regarding driving, in patients with progressive memory problems, driving will be impaired. We advise to have someone else do the driving if trouble finding directions or if minor accidents are reported. Independent driving assessment is available to determine safety of driving.  ABILITY TO BE LEFT ALONE: If patient is unable to contact 911 operator, consider using LifeLine, or when the need is there, arrange for someone to stay with patients. Smoking is a fire hazard, consider supervision or cessation. Risk of wandering should be assessed by caregiver and if detected at any point, supervision and safe proof recommendations should be instituted.  MEDICATION SUPERVISION: Inability  to self-administer medication needs to be constantly addressed. Implement a mechanism to ensure safe administration of the medications.  RECOMMENDATIONS FOR ALL PATIENTS WITH MEMORY PROBLEMS: 1. Continue to exercise (Recommend 30 minutes of walking everyday, or 3 hours every week) 2. Increase social interactions - continue going to [place] and enjoy social gatherings with friends and family 3. Eat healthy, avoid fried foods and eat more fruits and vegetables 4. Maintain adequate blood pressure, blood sugar, and blood cholesterol level. Reducing the risk of stroke and cardiovascular disease also helps promoting better memory. 5. Avoid stressful situations. Live a simple life and avoid aggravations. Organize your time and prepare for the next day in anticipation. 6. Sleep well, avoid any interruptions of sleep and avoid any distractions in the bedroom that may interfere with adequate sleep quality 7. Avoid sugar, avoid sweets as there is a strong link between excessive sugar intake, diabetes, and cognitive impairment The Mediterranean diet has been shown to help patients reduce the risk of progressive memory disorders and reduces cardiovascular risk. This includes eating fish, eat fruits and green leafy vegetables, nuts like almonds and hazelnuts, walnuts, and also use olive oil. Avoid fast foods and fried foods as much as possible. Avoid sweets and sugar as sugar use has been linked to worsening of memory function.  There is always a concern of gradual progression of memory problems. If this is the case, then we may need to adjust level of care according to patient needs. Support, both to the patient and caregiver, should then be put into place.  Labs today [address]

## 2023-08-03 NOTE — Progress Notes (Signed)
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 Patient is currently on memantine 10 mg twice daily, tolerating well (history of bradycardia). Mild cognitive decline is noted, unfortunately, unable to add ACHI due to slow heart rate. Patient is able to participate on his ADLs and to drive very short distances without difficulties     Recommendations : Follow up in 6  months. Continue memantine 10 mg twice daily, side effects discussed (bradycardia) Continue CPAP for OSA Recommend good control of her cardiovascular risk factors Continue to control mood as per PCP Recommend adult day programs for socialization    Subjective:    This patient is accompanied in the office by his daughter who supplements the history.  Previous records as well as any outside records available were reviewed prior to todays visit. Patient was last seen on 12/22/2022 with MMSE 20/30    Any changes in memory since last visit? " Worse"-daughter says.  He forgets conversations, or forgets what he is doing.  His daughter reports that he does not do much around the house, "sits around, does not leave the house". HE started a new habit of picking his L middle fingernail       " He speaks [place] more and more"-daughter says.   repeats oneself?  Endorsed, frequently Disoriented when walking into a room?  Patient denies when he is around the house, when he is out he may become disoriented. Leaving objects in unusual places?  May misplace things but not in unusual places except sometimes he leaves the dirty laundry in the clean  section   Wandering behavior?  Denies.   Any personality changes since last visit? Denies.  However as before, when talking about his memory, he may become defensive and argumentative. Any worsening depression?:  He denies, but his daughter reports that he is more aware of his cognitive status, which may lead to situational depression.  He does not want to be on antidepressants. Hallucinations or paranoia?  Denies.   Seizures? denies    Any sleep changes? Sleeps less-daughter disagrees," he sleeps a lot".  Denies vivid dreams, REM behavior or sleepwalking   Sleep apnea?  Endorsed, compliant with CPAP. Any hygiene concerns?  As before, he needs to be reminded to bathe, he can go 2 weeks without doing so. Independent of bathing and dressing?  Endorsed, he may use the same clothing or mismatch it when choosing clothes. Does the patient needs help with medications?  Daughter is in charge   Who is in charge of the finances?  Daughter is in charge     Any changes in appetite?  Denies. "More than ever".  He may eat again because he forgot that he already did. "His taste buds changed"   Patient have trouble swallowing? Denies.   Does the patient cook? No Any headaches?   denies   Chronic back pain  denies   Ambulates with difficulty? Denies.    Recent falls or head injuries? denies     Unilateral weakness, numbness or tingling? denies   Any tremors?  Denies   Any anosmia?  Endorsed, "this is new".  Any incontinence of  urine?  Endorsed, wears diapers, he forgets to change  them. Any bowel dysfunction?   Denies      Patient lives with his daughter  Does the patient drive?  Only very short distances, he denies getting lost.     History on Initial Assessment 12/21/2021: This is a pleasant 77 year old right-handed man with a history of hypertension, hyperlipidemia, MD, gout, CHF, s/p PPM, presenting for evaluation of memory loss. He feels his memory is "so-so, not great but not that bad." His daughter  Brett [name] is present to provide additional history. Brett [name] moved from [place] last August 2022 to help him after his cardiac issues last year. Since she moved in, she started noticing progressive memory changes. There is a lot of repetitive questions, same conversations. He gets the days confused and would not be able to keep up with his doctors appointments. He was previously managing his own medications and denies missing doses, Brett [name] started to help in August, but recently started putting his medications in a pillbox because he was getting confused with the bottles. He remembers to take them but she has to remind him sometimes. His last HbA1c was 7.2. He was managing finances without difficulties, she now writes the checks and he signs them. He does not cook. He got lost driving around 3-4 months ago, he was 30 minutes away from home but was driving around for 3 hours and the police pulled him over. Glucose level at that time was 66. He has been driving very little since then. He denies misplacing things but loses the computer mouse or would disable the camera and Brett [name] has to figure out what he had pressed when he says it is not his fault. He is mostly on his laptop during the day watching "junk." He would refuse to go outside with family or walk around, giving his pacemaker/cardiac issues as a reason even if he has been cleared for exercise by his cardiologist. He is able to dress himself. He does not bathe regularly, only when he has a doctors appointment. There is no paranoia or hallucinations, but he gets easily irritated when Brett [name] tries to get him to do something. He reports sleep is good, but Diana shakes her head. He fell a week ago, he got lost in the house to use the bathroom, and fell down 6 steps. He denies any headaches, dizziness, diplopia, dysarthria/dysphagia, neck/back pain. He has occasional left hand numbness and tingling. He has bowel and bladder incontinence and wears Depends, Brett [name] also feels  this causes him to isolate himself. He reports mood is good. Brett [name] reports hearing loss, as well as decreased ability to smell and taste. He has mild hand tremors that do not affect daily activities. He is currently living with Brett [name] and her boyfriend. His other daughter also lives in [place]. There is no clear family history of dementia, no significant head injuries. He stopped drinking alcohol since his heart issues started.   Laboratory Data:          Lab Results  Component Value Date    TSH 2.48 12/21/2021             Lab Results  Component Value Date    VITAMINB12 208 (L) 12/21/2021     PREVIOUS MEDICATIONS:   CURRENT MEDICATIONS:  Outpatient Encounter Medications as of 08/03/2023  Medication Sig   Alcohol Swabs (ALCOHOL PREP) PADS Clean injection site before each injection (Patient taking differently: 1 each by Other route See admin instructions. Clean  injection site before each injection)   allopurinol (ZYLOPRIM) 300 MG tablet TAKE 1 TABLET BY MOUTH ONCE  DAILY TO PREVENT GOUT   aspirin 81 MG tablet Take 81 mg by mouth daily.   atorvastatin (LIPITOR) 20 MG tablet TAKE 1 TABLET BY MOUTH DAILY FOR CHOLESTEROL (Patient taking differently: Take by mouth daily. TAKE 1 TABLET BY MOUTH  DAILY FOR CHOLESTEROL)   bisoprolol (ZEBETA) 10 MG tablet TAKE 1 TABLET BY MOUTH DAILY   Cholecalciferol (VITAMIN D PO) Take 5,000 Units by mouth daily.   D-Mannose 500 MG CAPS Take 1,500 mg by mouth daily.   dapagliflozin propanediol (FARXIGA) 10 MG TABS tablet TAKE 1 TABLET BY MOUTH DAILY   famotidine (PEPCID) 40 MG tablet Take 40 mg by mouth as needed for heartburn or indigestion.   furosemide (LASIX) 20 MG tablet TAKE 1 TABLET BY MOUTH DAILY  NEEDS FOLLOW UP APPOINTMENT FOR  MORE REFILLS   insulin glargine, 2 Unit Dial, (TOUJEO MAX SOLOSTAR) 300 UNIT/ML Solostar Pen Inject 76 Units into the skin daily.   Loratadine (CLARITIN PO) Take 1 tablet by mouth as needed (allergies). Unknown strength    magnesium oxide (MAG-OX) 400 MG tablet Take 400 mg by mouth daily.   memantine (NAMENDA) 10 MG tablet TAKE 1 TABLET BY MOUTH TWICE  DAILY   metFORMIN (GLUCOPHAGE-XR) 500 MG 24 hr tablet TAKE 2 TABLETS BY MOUTH TWICE  DAILY WITH MEALS FOR DIABETES   sacubitril-valsartan (ENTRESTO) 97-103 MG Take 1 tablet by mouth 2 (two) times daily. NEEDS FOLLOW UP APPOINTMENT FOR MORE REFILLS   sharps container 1 each by Does not apply route as needed. (Patient taking differently: 1 each by Does not apply route as needed (Dispose of needles).)   spironolactone (ALDACTONE) 25 MG tablet Take 1 tablet (25 mg total) by mouth daily.   tamsulosin (FLOMAX) 0.4 MG CAPS capsule TAKE 1 CAPSULE BY MOUTH AT  BEDTIME FOR PROSTATE   No facility-administered encounter medications on file as of 08/03/2023.       12/22/2022   10:00 AM 12/17/2019   11:14 AM  MMSE - Mini Mental State Exam  Orientation to time 5 5  Orientation to Place 4 5  Registration 3 3  Attention/ Calculation 0 5  Recall 0 3  Language- name 2 objects 2 2  Language- repeat 1 1  Language- follow 3 step command 3 3  Language- read & follow direction 1 1  Write a sentence 1 1  Copy design 0 1  Total score 20 30      12/21/2021    9:00 AM  Montreal Cognitive Assessment   Visuospatial/ Executive (0/5) 1  Naming (0/3) 2  Attention: Read list of digits (0/2) 0  Attention: Read list of letters (0/1) 0  Attention: Serial 7 subtraction starting at 100 (0/3) 3  Language: Repeat phrase (0/2) 0  Language : Fluency (0/1) 0  Abstraction (0/2) 0  Delayed Recall (0/5) 0  Orientation (0/6) 5  Total 11  Adjusted Score (based on education) 12    Objective:     PHYSICAL EXAMINATION:    VITALS:  There were no vitals filed for this visit.  GEN:  The patient appears stated age and is in NAD. HEENT:  Normocephalic, atraumatic.   Neurological examination:  General: NAD, well-groomed, appears stated age. Orientation: The patient is alert. Oriented to  person, place and date Cranial nerves: There is good facial symmetry.The speech is fluent and clear. No aphasia or dysarthria. Fund of knowledge is  appropriate. Recent and remote memory are impaired. Attention and concentration are reduced.  Able to name objects and repeat phrases.  Hearing is intact to conversational tone.  Sensation: Sensation is intact to light touch throughout Motor: Strength is at least antigravity x4. DTR's 2/4 in UE/LE     Movement examination: Tone: There is normal tone in the UE/LE Abnormal movements:  no tremor.  No myoclonus.  No asterixis.   Coordination:  There is no decremation with RAM's. Normal finger to nose  Gait and Station: The patient has no difficulty arising out of a deep-seated chair without the use of the hands. The patient's stride length is good.  Gait is cautious and narrow.    Thank you for allowing [place] the opportunity to participate in the care of this nice patient. Please do not hesitate to contact us for any questions or concerns.   Total time spent on today's visit was 25 minutes dedicated to this patient today, preparing to see patient, examining the patient, ordering tests and/or medications and counseling the patient, documenting clinical information in the EHR or other health record, independently interpreting results and communicating results to the patient/family, discussing treatment and goals, answering patient's questions and coordinating care.  Cc:  de Peru, Raymond J, MD  Marlowe Kays 08/03/2023 7:33 AM

## 2023-08-16 ENCOUNTER — Telehealth (HOSPITAL_BASED_OUTPATIENT_CLINIC_OR_DEPARTMENT_OTHER): Payer: Self-pay | Admitting: *Deleted

## 2023-08-16 NOTE — Telephone Encounter (Signed)
LVM to schedule medicare wellness visit

## 2023-09-05 ENCOUNTER — Other Ambulatory Visit: Payer: Self-pay | Admitting: Cardiology

## 2023-09-07 ENCOUNTER — Ambulatory Visit: Payer: Medicare Other | Attending: Family Medicine | Admitting: Audiology

## 2023-09-07 DIAGNOSIS — H6122 Impacted cerumen, left ear: Secondary | ICD-10-CM | POA: Diagnosis not present

## 2023-09-07 NOTE — Procedures (Signed)
  [facility] [address] [place], [place]  [address] [phone]  AUDIOLOGICAL  EVALUATION  NAME: Brett [name]     DOB:   1946/09/30      MRN: [id number]                                                                                     DATE: 09/07/2023     REFERENT: de Peru, Raymond J, MD STATUS: Outpatient DIAGNOSIS: Cerumen impaction, left ear  History: Brett [name] was seen for an audiological evaluation due to decreased hearing occurring for many years.  Brett [name] was accompanied to the appointment by his daughter.  Brett [name]'s daughter was the primary historian during the appointment.  She reports Brett [name] has had difficulty hearing and communicating for many years.  She reports she has to talk very loudly to Brett [name].  There is no reported history of otalgia, aural fullness, tinnitus, dizziness.  Brett [name]'s medical history is significant for Alzheimer's and cognitive impairment.  Evaluation:  Otoscopy showed excessive cerumen in both ear canals and the tympanic membrane could not be visualized in either ear. Tympanometry results were consistent in the right ear with normal tympanic membrane mobility and normal middle ear pressure (Type A) and were consistent in the left ear with no tympanic membrane mobility likely due to a cerumen impaction (Type B).  Audiometric testing was not completed at today's evaluation due to cerumen impaction.  Results:  The test results were reviewed with Brett [name] and his daughter.  Tympanometry showed normal middle ear function in the right ear and occluding cerumen in the left ear.  It was recommended for Brett [name] to have cerumen removed prior to the hearing evaluation.  The audiological evaluation will be rescheduled.  Recommendations: 1.  Schedule an appointment with Brett [name]'s PCP for cerumen removal. 2.  Return for an audiological evaluation on 10/12/2023 at 8:30 AM after cerumen management.    15 minutes spent testing and counseling on results.   If  you have any questions please feel free to contact me at [phone].  Marton Redwood Audiologist, Au.D., CCC-A 09/07/2023  8:40 AM  Cc: de Peru, Raymond J, MD

## 2023-09-08 ENCOUNTER — Other Ambulatory Visit: Payer: Medicare Other

## 2023-09-08 ENCOUNTER — Ambulatory Visit: Payer: Medicare Other | Admitting: Oncology

## 2023-09-11 ENCOUNTER — Ambulatory Visit (INDEPENDENT_AMBULATORY_CARE_PROVIDER_SITE_OTHER): Payer: PRIVATE HEALTH INSURANCE

## 2023-09-11 DIAGNOSIS — I420 Dilated cardiomyopathy: Secondary | ICD-10-CM | POA: Diagnosis not present

## 2023-09-11 DIAGNOSIS — I5020 Unspecified systolic (congestive) heart failure: Secondary | ICD-10-CM

## 2023-09-11 LAB — CUP PACEART REMOTE DEVICE CHECK
Battery Remaining Longevity: 83 mo
Battery Voltage: 2.99 V
Brady Statistic AP VP Percent: 40.41 %
Brady Statistic AP VS Percent: 0.6 %
Brady Statistic AS VP Percent: 57.77 %
Brady Statistic AS VS Percent: 1.23 %
Brady Statistic RA Percent Paced: 40.22 %
Brady Statistic RV Percent Paced: 40.52 %
Date Time Interrogation Session: 20241118001606
HighPow Impedance: 68 Ohm
Implantable Lead Connection Status: 753985
Implantable Lead Connection Status: 753985
Implantable Lead Connection Status: 753985
Implantable Lead Implant Date: 20221121
Implantable Lead Implant Date: 20221121
Implantable Lead Implant Date: 20221121
Implantable Lead Location: 753858
Implantable Lead Location: 753859
Implantable Lead Location: 753860
Implantable Lead Model: 4598
Implantable Lead Model: 5076
Implantable Pulse Generator Implant Date: 20221121
Lead Channel Impedance Value: 145.871
Lead Channel Impedance Value: 145.871
Lead Channel Impedance Value: 159.6 Ohm
Lead Channel Impedance Value: 178.5 Ohm
Lead Channel Impedance Value: 178.5 Ohm
Lead Channel Impedance Value: 266 Ohm
Lead Channel Impedance Value: 323 Ohm
Lead Channel Impedance Value: 323 Ohm
Lead Channel Impedance Value: 323 Ohm
Lead Channel Impedance Value: 399 Ohm
Lead Channel Impedance Value: 399 Ohm
Lead Channel Impedance Value: 399 Ohm
Lead Channel Impedance Value: 437 Ohm
Lead Channel Impedance Value: 513 Ohm
Lead Channel Impedance Value: 513 Ohm
Lead Channel Impedance Value: 570 Ohm
Lead Channel Impedance Value: 589 Ohm
Lead Channel Impedance Value: 627 Ohm
Lead Channel Pacing Threshold Amplitude: 0.75 V
Lead Channel Pacing Threshold Amplitude: 0.75 V
Lead Channel Pacing Threshold Amplitude: 0.75 V
Lead Channel Pacing Threshold Pulse Width: 0.4 ms
Lead Channel Pacing Threshold Pulse Width: 0.4 ms
Lead Channel Pacing Threshold Pulse Width: 0.4 ms
Lead Channel Sensing Intrinsic Amplitude: 1.25 mV
Lead Channel Sensing Intrinsic Amplitude: 1.25 mV
Lead Channel Sensing Intrinsic Amplitude: 19.625 mV
Lead Channel Sensing Intrinsic Amplitude: 19.625 mV
Lead Channel Setting Pacing Amplitude: 1.5 V
Lead Channel Setting Pacing Amplitude: 1.5 V
Lead Channel Setting Pacing Amplitude: 1.75 V
Lead Channel Setting Pacing Pulse Width: 0.4 ms
Lead Channel Setting Pacing Pulse Width: 0.4 ms
Lead Channel Setting Sensing Sensitivity: 0.3 mV
Zone Setting Status: 755011
Zone Setting Status: 755011

## 2023-09-13 ENCOUNTER — Other Ambulatory Visit (HOSPITAL_BASED_OUTPATIENT_CLINIC_OR_DEPARTMENT_OTHER): Payer: Self-pay | Admitting: Family Medicine

## 2023-09-13 DIAGNOSIS — E1122 Type 2 diabetes mellitus with diabetic chronic kidney disease: Secondary | ICD-10-CM

## 2023-09-14 ENCOUNTER — Ambulatory Visit (INDEPENDENT_AMBULATORY_CARE_PROVIDER_SITE_OTHER): Payer: Medicare Other | Admitting: Family Medicine

## 2023-09-14 ENCOUNTER — Encounter (HOSPITAL_BASED_OUTPATIENT_CLINIC_OR_DEPARTMENT_OTHER): Payer: Self-pay | Admitting: Family Medicine

## 2023-09-14 VITALS — BP 128/67 | HR 63 | Ht 65.0 in | Wt 206.2 lb

## 2023-09-14 DIAGNOSIS — H6122 Impacted cerumen, left ear: Secondary | ICD-10-CM | POA: Diagnosis not present

## 2023-09-14 DIAGNOSIS — Z0000 Encounter for general adult medical examination without abnormal findings: Secondary | ICD-10-CM

## 2023-09-14 NOTE — Assessment & Plan Note (Signed)
Noted recently by audiologist during office evaluation.  Unfortunately, this prevented complete testing to be performed.  On exam today, still does have moderate cerumen present, however has been utilizing home therapies to try to remove blockage.  Can proceed with ear lavage today

## 2023-09-14 NOTE — Progress Notes (Signed)
Subjective:    CC: Annual Physical Exam  HPI:  Brett [name] is a 77 y.o. presenting for annual physical  I reviewed the past medical history, family history, social history, surgical history, and allergies today and no changes were needed.  Please see the problem list section below in epic for further details.  Past Medical History: Past Medical History:  Diagnosis Date   Acid reflux    Allergy    Colon polyp    Diabetes mellitus without complication (HCC)    Dilated cardiomyopathy (HCC)    non isch   Gout    Gout    Hemochromatosis    Hypertension    OSA (obstructive sleep apnea)    Urine frequency    Past Surgical History: Past Surgical History:  Procedure Laterality Date   BIV ICD INSERTION CRT-D N/A 09/13/2021   Procedure: BIV ICD INSERTION CRT-D;  Surgeon: Regan Lemming, MD;  Location: [facility] INVASIVE CV LAB;  Service: Cardiovascular;  Laterality: N/A;   EYE SURGERY Bilateral    Cataract   RIGHT/LEFT HEART CATH AND CORONARY ANGIOGRAPHY N/A 03/29/2021   Procedure: RIGHT/LEFT HEART CATH AND CORONARY ANGIOGRAPHY;  Surgeon: Elder Negus, MD;  Location: [facility] INVASIVE CV LAB;  Service: Cardiovascular;  Laterality: N/A;   Social History: Social History   Socioeconomic History   Marital status: Divorced    Spouse name: Not on file   Number of children: Not on file   Years of education: Not on file   Highest education level: 8th grade  Occupational History   Not on file  Tobacco Use   Smoking status: Former    Current packs/day: 0.00    Average packs/day: 3.0 packs/day for 19.1 years (57.3 ttl pk-yrs)    Types: Cigarettes    Start date: 10/24/1957    Quit date: 11/24/1976    Years since quitting: 46.8    Passive exposure: Past   Smokeless tobacco: Never  Vaping Use   Vaping status: Never Used  Substance and Sexual Activity   Alcohol use: Yes    Comment: Occasionally   Drug use: No   Sexual activity: Not on file  Other Topics Concern   Not on file   Social History Narrative   Pt lives w/ ex wife   Right handed    Retired   Caffeine occasionally   Social Determinants of Health   Financial Resource Strain: Low Risk  (09/13/2023)   Overall Financial Resource Strain (CARDIA)    Difficulty of Paying Living Expenses: Not hard at all  Food Insecurity: No Food Insecurity (09/13/2023)   Hunger Vital Sign    Worried About Running Out of Food in the Last Year: Never true    Ran Out of Food in the Last Year: Never true  Transportation Needs: No Transportation Needs (09/13/2023)   PRAPARE - Administrator, Civil Service (Medical): No    Lack of Transportation (Non-Medical): No  Physical Activity: Insufficiently Active (09/13/2023)   Exercise Vital Sign    Days of Exercise per Week: 1 day    Minutes of Exercise per Session: 10 min  Stress: No Stress Concern Present (09/13/2023)   [employer] of Occupational Health - Occupational Stress Questionnaire    Feeling of Stress : Not at all  Social Connections: Moderately Isolated (09/13/2023)   Social Connection and Isolation Panel [NHANES]    Frequency of Communication with Friends and Family: Twice a week    Frequency of Social Gatherings with Friends and  Family: Once a week    Attends Religious Services: 1 to 4 times per year    Active Member of Clubs or Organizations: No    Attends Engineer, structural: Not on file    Marital Status: Divorced   Family History: Family History  Problem Relation Age of Onset   Diabetes Father    Diabetes Other    Hypertension Other    Hyperlipidemia Other    Heart disease Other    Cancer Maternal Uncle        brain   Heart attack Brother        60   Diabetes Brother    Allergies: No Known Allergies Medications: See med rec.  Review of Systems: No headache, visual changes, nausea, vomiting, diarrhea, constipation, dizziness, abdominal pain, skin rash, fevers, chills, night sweats, swollen lymph nodes, weight loss,  chest pain, body aches, joint swelling, muscle aches, shortness of breath, mood changes, visual or auditory hallucinations.  Objective:    BP 128/67 (BP Location: Right Arm, Patient Position: Sitting, Cuff Size: Normal)   Pulse 63   Ht 5\' 5"  (1.651 m)   Wt 206 lb 3.2 oz (93.5 kg)   SpO2 96%   BMI 34.31 kg/m   General: Well Developed, well nourished, and in no acute distress.  Neuro: Alert and oriented x3, extra-ocular muscles intact, sensation grossly intact. Cranial nerves II through XII are intact, motor, sensory, and coordinative functions are all intact. HEENT: Normocephalic, atraumatic, pupils equal round reactive to light, neck supple, no masses, no lymphadenopathy, thyroid nonpalpable. Oropharynx, nasopharynx, external ear canals are unremarkable. Skin: Warm and dry, no rashes noted.  Cardiac: Regular rate and rhythm, no murmurs rubs or gallops.  Respiratory: Clear to auscultation bilaterally. Not using accessory muscles, speaking in full sentences.  Abdominal: Soft, nontender, nondistended, positive bowel sounds, no masses, no organomegaly.  Musculoskeletal: Shoulder, elbow, wrist, hip, knee, ankle stable, and with full range of motion.  Impression and Recommendations:    Wellness examination Assessment & Plan: Routine HCM labs reviewed. HCM reviewed/discussed. Anticipatory guidance regarding healthy weight, lifestyle and choices given. Recommend healthy diet.  Recommend approximately 150 minutes/week of moderate intensity exercise Recommend regular dental and vision exams Always use seatbelt/lap and shoulder restraints Recommend using smoke alarms and checking batteries at least twice a year Recommend using sunscreen when outside Discussed immunization recommendations Discussed tetanus immunization recommendations, patient is UTD   Impacted cerumen of left ear Assessment & Plan: Noted recently by audiologist during office evaluation.  Unfortunately, this prevented  complete testing to be performed.  On exam today, still does have moderate cerumen present, however has been utilizing home therapies to try to remove blockage.  Can proceed with ear lavage today   Return in about 3 months (around 12/15/2023) for diabetes, hypertension.   ___________________________________________ Aviana Shevlin de Peru, MD, ABFM, CAQSM Primary Care and Sports Medicine [facility]

## 2023-09-14 NOTE — Assessment & Plan Note (Signed)
Routine HCM labs reviewed. HCM reviewed/discussed. Anticipatory guidance regarding healthy weight, lifestyle and choices given. Recommend healthy diet.  Recommend approximately 150 minutes/week of moderate intensity exercise Recommend regular dental and vision exams Always use seatbelt/lap and shoulder restraints Recommend using smoke alarms and checking batteries at least twice a year Recommend using sunscreen when outside Discussed immunization recommendations Discussed tetanus immunization recommendations, patient is UTD

## 2023-09-20 ENCOUNTER — Other Ambulatory Visit: Payer: Self-pay | Admitting: *Deleted

## 2023-09-21 ENCOUNTER — Other Ambulatory Visit: Payer: Self-pay | Admitting: Cardiology

## 2023-09-24 ENCOUNTER — Other Ambulatory Visit (HOSPITAL_COMMUNITY): Payer: Self-pay | Admitting: Cardiology

## 2023-09-28 ENCOUNTER — Inpatient Hospital Stay: Payer: Medicare Other

## 2023-09-28 ENCOUNTER — Inpatient Hospital Stay: Payer: Medicare Other | Attending: Oncology | Admitting: Oncology

## 2023-09-28 DIAGNOSIS — E7800 Pure hypercholesterolemia, unspecified: Secondary | ICD-10-CM | POA: Diagnosis not present

## 2023-09-28 DIAGNOSIS — Z87891 Personal history of nicotine dependence: Secondary | ICD-10-CM | POA: Insufficient documentation

## 2023-09-28 DIAGNOSIS — G4730 Sleep apnea, unspecified: Secondary | ICD-10-CM | POA: Insufficient documentation

## 2023-09-28 DIAGNOSIS — F0280 Dementia in other diseases classified elsewhere without behavioral disturbance: Secondary | ICD-10-CM | POA: Diagnosis not present

## 2023-09-28 DIAGNOSIS — E119 Type 2 diabetes mellitus without complications: Secondary | ICD-10-CM | POA: Insufficient documentation

## 2023-09-28 DIAGNOSIS — M109 Gout, unspecified: Secondary | ICD-10-CM | POA: Diagnosis not present

## 2023-09-28 DIAGNOSIS — I110 Hypertensive heart disease with heart failure: Secondary | ICD-10-CM | POA: Diagnosis not present

## 2023-09-28 DIAGNOSIS — G309 Alzheimer's disease, unspecified: Secondary | ICD-10-CM | POA: Insufficient documentation

## 2023-09-28 DIAGNOSIS — Z8349 Family history of other endocrine, nutritional and metabolic diseases: Secondary | ICD-10-CM | POA: Insufficient documentation

## 2023-09-28 DIAGNOSIS — I509 Heart failure, unspecified: Secondary | ICD-10-CM | POA: Insufficient documentation

## 2023-09-28 LAB — CBC WITH DIFFERENTIAL (CANCER CENTER ONLY)
Abs Immature Granulocytes: 0.03 10*3/uL (ref 0.00–0.07)
Basophils Absolute: 0 10*3/uL (ref 0.0–0.1)
Basophils Relative: 1 %
Eosinophils Absolute: 0.2 10*3/uL (ref 0.0–0.5)
Eosinophils Relative: 2 %
HCT: 45.3 % (ref 39.0–52.0)
Hemoglobin: 16.2 g/dL (ref 13.0–17.0)
Immature Granulocytes: 0 %
Lymphocytes Relative: 21 %
Lymphs Abs: 1.8 10*3/uL (ref 0.7–4.0)
MCH: 33.5 pg (ref 26.0–34.0)
MCHC: 35.8 g/dL (ref 30.0–36.0)
MCV: 93.6 fL (ref 80.0–100.0)
Monocytes Absolute: 0.8 10*3/uL (ref 0.1–1.0)
Monocytes Relative: 9 %
Neutro Abs: 5.7 10*3/uL (ref 1.7–7.7)
Neutrophils Relative %: 67 %
Platelet Count: 197 10*3/uL (ref 150–400)
RBC: 4.84 MIL/uL (ref 4.22–5.81)
RDW: 13.1 % (ref 11.5–15.5)
WBC Count: 8.5 10*3/uL (ref 4.0–10.5)
nRBC: 0 % (ref 0.0–0.2)

## 2023-09-28 LAB — FERRITIN: Ferritin: 164 ng/mL (ref 24–336)

## 2023-09-28 NOTE — Progress Notes (Signed)
  [facility] OFFICE PROGRESS NOTE   Diagnosis: Hemochromatosis  INTERVAL HISTORY:   Mr. Curro returns as scheduled.  No complaint.  Is here with his daughter.  She reports he continues to have memory loss.  Objective:  Vital signs in last 24 hours:  Blood pressure 133/72, pulse 64, temperature 97.9 F (36.6 C), temperature source Temporal, resp. rate 18, height 5\' 5"  (1.651 m), weight 206 lb (93.4 kg), SpO2 98%.    Resp: Lungs clear bilaterally Cardio: Regular rhythm, distant heart sounds GI: No hepatosplenomegaly Vascular: No leg edema   Lab Results:  Lab Results  Component Value Date   WBC 8.5 09/28/2023   HGB 16.2 09/28/2023   HCT 45.3 09/28/2023   MCV 93.6 09/28/2023   PLT 197 09/28/2023   NEUTROABS 5.7 09/28/2023    CMP  Lab Results  Component Value Date   NA 139 07/27/2023   K 4.2 07/27/2023   CL 100 07/27/2023   CO2 23 07/27/2023   GLUCOSE 185 (H) 07/27/2023   BUN 24 07/27/2023   CREATININE 1.24 07/27/2023   CALCIUM 9.6 07/27/2023   PROT 6.4 (L) 03/25/2021   ALBUMIN 3.9 03/25/2021   AST 20 03/25/2021   ALT 28 03/25/2021   ALKPHOS 33 (L) 03/25/2021   BILITOT 1.1 03/25/2021   GFRNONAA 59 (L) 05/19/2022   GFRAA 80 09/16/2020    No results found for: "CEA1", "CEA", "XBJ478", "CA125"  No results found for: "INR", "LABPROT"  Imaging:  No results found.  Medications: I have reviewed the patient's current medications.   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 He was last treated with phlebotomy therapy on 09/16/2019 History of tobacco use in the remote past. Hypertension. Hypercholesterolemia. Diabetes. Gout. Sleep apnea. Family history of hemochromatosis.  9.   Alzheimer's 10.  CHF 11.  Sleep apnea    Disposition: Mr Tae has a history of hemochromatosis.  He has not required phlebotomy for several years and the ferritin level was in goal range last year.  We will  follow-up on the ferritin level from today.  He has multiple comorbid conditions including Alzheimer's. He is followed by Dr. de Peru and cardiology.  He will be discharged in the hematology clinic.  I recommend Dr. De Peru obtain a ferritin yearly.  I will be glad to see him if he develops a rise in the ferritin level or a new hematologic abnormality.  Thornton Papas, MD  09/28/2023  8:30 AM

## 2023-09-29 ENCOUNTER — Other Ambulatory Visit: Payer: Self-pay

## 2023-09-29 ENCOUNTER — Telehealth: Payer: Self-pay

## 2023-09-29 DIAGNOSIS — D649 Anemia, unspecified: Secondary | ICD-10-CM

## 2023-09-29 NOTE — Telephone Encounter (Signed)
Patient's daughter verbal understanding and had no further question or concern

## 2023-09-29 NOTE — Telephone Encounter (Signed)
-----   Message from Thornton Papas sent at 09/28/2023  1:46 PM EST ----- Please call daughter, the ferritin level is higher, within normal range but above goal range for hemochromatosis.  Repeat in 3 to 4 months, we will consider phlebotomy if significantly higher then.  We had decided to discharge him from the hematology clinic, but I think she he should have a ferritin level in 3 to 4 months and follow-up with [place] in 8-9 months with another ferritin level

## 2023-10-02 ENCOUNTER — Encounter (INDEPENDENT_AMBULATORY_CARE_PROVIDER_SITE_OTHER): Payer: Medicare Other | Admitting: Ophthalmology

## 2023-10-02 DIAGNOSIS — H35372 Puckering of macula, left eye: Secondary | ICD-10-CM | POA: Diagnosis not present

## 2023-10-02 DIAGNOSIS — Z7984 Long term (current) use of oral hypoglycemic drugs: Secondary | ICD-10-CM | POA: Diagnosis not present

## 2023-10-02 DIAGNOSIS — H43821 Vitreomacular adhesion, right eye: Secondary | ICD-10-CM

## 2023-10-02 DIAGNOSIS — H43813 Vitreous degeneration, bilateral: Secondary | ICD-10-CM | POA: Diagnosis not present

## 2023-10-02 DIAGNOSIS — E113393 Type 2 diabetes mellitus with moderate nonproliferative diabetic retinopathy without macular edema, bilateral: Secondary | ICD-10-CM | POA: Diagnosis not present

## 2023-10-06 NOTE — Progress Notes (Signed)
Remote ICD transmission.   

## 2023-10-12 ENCOUNTER — Ambulatory Visit: Payer: Medicare Other | Attending: Family Medicine | Admitting: Audiology

## 2023-10-12 DIAGNOSIS — H903 Sensorineural hearing loss, bilateral: Secondary | ICD-10-CM | POA: Diagnosis not present

## 2023-10-12 NOTE — Procedures (Signed)
[facility] [address] [place], [place]  [address] [phone]  AUDIOLOGICAL  EVALUATION  NAME: Brett Brett [name]     DOB:   [birth year]      MRN: [id number]                                                                                     DATE: 10/12/2023     REFERENT: de [name], Brett J, MD STATUS: Outpatient DIAGNOSIS: sensorineural hearing loss, bilateral    History: Brett Brett [name] was seen for an audiological evaluation due to decreased hearing. Brett Brett [name] was accompanied to the appointment by his Brett [name]. Brett Brett [name] reports he has had right hearing loss for 20+ years and now reports decreased hearing in the left ear. Brett Brett [name] reports Brett [name] has great difficulty hearing and communicating. There is no reported history of otalgia, aural fullness, tinnitus, and dizziness. Brett Brett [name]'s medical history is significant for Alzheimer's and cognitive impairment. Brett Brett [name] primary language is [place] and English is his second language.   Brett Brett [name] was previously seen for an audiological evaluation on 09/07/2023 at which time otoscopy showed excessive cerumen in both ear canals and the tympanic membrane could not be visualized in either ear.  Tympanometry results were consistent in the right ear with normal tympanic membrane mobility and normal middle ear pressure (Type A) and were consistent in the left ear with no tympanic membrane mobility likely due to a cerumen impaction (Type B). Further audiologic testing was not completed due to cerumen impaction. Brett Brett [name] recently underwent cerumen removal at his PCP's office.   Evaluation:  Otoscopy showed a clear view of the tympanic membranes, bilaterally Tympanometry results were consistent with normal middle ear pressure and normal tympanic membrane mobility (Type A), bilaterally.  Audiometric testing was completed using Conventional Audiometry techniques with insert earphones and TDH headphones. Test results are consistent in the right ear with a mild  sloping to severe sensorineural hearing loss with a mixed component noted at 1000 Hz. Test results are consistent in the left ear with normal hearing sensitivity sloping to a moderately-severe sensorineural hearing loss. There is an asymmetry noted at [phone] Hz and 6000-8000 Hz worse in the right ear. Speech Recognition Thresholds were obtained at 60 dB HL in the right ear and at 25  dB HL in the left ear. Word Recognition Testing was completed at 90 dB HL and Brett Brett [name] scored 60% in the right ear.  Word Recognition Testing was completed at 70 dB HL and Brett Brett [name] scored 80% in the right ear.  Asymmetric word recognition noted, worse in the right ear.   Results:  The test results were reviewed with Brett Brett [name] and his Brett [name]. Test results are consistent in the right ear with a mild sloping to severe sensorineural hearing loss with a mixed component noted at 1000 Hz. Test results are consistent in the left ear with normal hearing sensitivity sloping to a moderately-severe sensorineural hearing loss. There is an asymmetry noted at [phone] Hz and 6000-8000 Hz worse in the right ear. There is an asymmetric word recognition noted, worse in the right ear. Brett Brett [name] will have hearing and communication difficulty in many listening environments. He will benefit from the use  of good communication strategies and the use of amplification. Brett Brett [name] was given information on Brett Brett [name] aids. Brett Brett [name] reports he is not interested in pursuing amplification at this time. Brett Brett [name] was given a copy of the audiogram.   Recommendations: 1.   Communication Needs Assessment with an Audiologist if motivated to pursue amplification.  2.    Referral to an ENT for evaluation for asymmetric hearing loss.    30 minutes spent testing and counseling on results. The audiogram can be found under the media file.   If you have any questions please feel free to contact me at [phone].  Brett Brett [name] Audiologist, Au.D., CCC-A 10/12/2023  9:36  AM  Cc: de [name], Brett J, MD

## 2023-10-18 ENCOUNTER — Other Ambulatory Visit: Payer: Self-pay | Admitting: Cardiology

## 2023-10-22 ENCOUNTER — Encounter (HOSPITAL_BASED_OUTPATIENT_CLINIC_OR_DEPARTMENT_OTHER): Payer: Self-pay | Admitting: Family Medicine

## 2023-10-26 ENCOUNTER — Other Ambulatory Visit: Payer: Self-pay | Admitting: Cardiology

## 2023-10-26 ENCOUNTER — Ambulatory Visit (HOSPITAL_BASED_OUTPATIENT_CLINIC_OR_DEPARTMENT_OTHER): Payer: Medicare Other | Admitting: Family Medicine

## 2023-10-26 VITALS — BP 128/78 | HR 66 | Ht 65.0 in | Wt 197.3 lb

## 2023-10-26 DIAGNOSIS — N182 Chronic kidney disease, stage 2 (mild): Secondary | ICD-10-CM | POA: Diagnosis not present

## 2023-10-26 DIAGNOSIS — E1122 Type 2 diabetes mellitus with diabetic chronic kidney disease: Secondary | ICD-10-CM

## 2023-10-26 DIAGNOSIS — Z794 Long term (current) use of insulin: Secondary | ICD-10-CM | POA: Diagnosis not present

## 2023-10-26 LAB — POCT GLYCOSYLATED HEMOGLOBIN (HGB A1C)
HbA1c POC (<> result, manual entry): 8 % (ref 4.0–5.6)
Hemoglobin A1C: 8 % — AB (ref 4.0–5.6)

## 2023-10-26 NOTE — Assessment & Plan Note (Addendum)
 Patient has been doing well with current regimen he continues with metformin  in the morning, Farxiga , insulin  glargine.  Daughter did decrease dose of insulin  glargine from 76 units down to 74 units daily given that he had an episode where he woke up 1 morning and seemed to be out of it..  She thought this may have been related to his blood sugar, however did not check her blood sugar at that time.  Fasting blood sugars have generally been controlled reportedly, do not have specific numbers to review.  Most recent hemoglobin A1c showed borderline control at 7.9 %.  He generally has had fairly good control of A1c over the past couple years.  Is due for recheck of hemoglobin A1c at this time. We will proceed with recheck of hemoglobin A1c today.  Hemoglobin A1c has slightly increased further, now at 8.0%.  This continues to be above goal, however hesitant to make significant medication changes due to reported episode by daughter.  Recommend continuing with current medication regimen and focusing more on lifestyle modification/dietary adjustments.  If any further episodes occur, would check blood sugar at that time in order to have further information on whether blood sugar is playing a role with these acute changes. No changes to medication regimen currently.

## 2023-10-26 NOTE — Patient Instructions (Signed)
  Medication Instructions:  Your physician recommends that you continue on your current medications as directed. Please refer to the Current Medication list given to you today. --If you need a refill on any your medications before your next appointment, please call your pharmacy first. If no refills are authorized on file call the office.--   Follow-Up: Your next appointment:   Your physician recommends that you schedule a follow-up appointment in: Medicare Wellness 3 months with Dr. de [place]  You will receive a text message or e-mail with a link to a survey about your care and experience with us  today! We would greatly appreciate your feedback!   Thanks for letting us  be apart of your health journey!!  [facility] Care [facility] Medicine   Dr. Quintin sheerer [place]   We encourage you to activate your patient portal called MyChart.  Sign up information is provided on this After Visit Summary.  MyChart is used to connect with patients for Virtual Visits (Telemedicine).  Patients are able to view lab/test results, encounter notes, upcoming appointments, etc.  Non-urgent messages can be sent to your provider as well. To learn more about what you can do with MyChart, please visit --  [email or web address].

## 2023-10-26 NOTE — Progress Notes (Signed)
    Procedures performed today:    None.  Independent interpretation of notes and tests performed by another provider:   None.  Brief History, Exam, Impression, and Recommendations:    BP 128/78 (BP Location: Right Arm, Patient Position: Sitting, Cuff Size: Normal)   Pulse 66   Ht 5' 5 (1.651 m)   Wt 197 lb 4.8 oz (89.5 kg)   SpO2 97%   BMI 32.83 kg/m   Type 2 diabetes mellitus with stage 2 chronic kidney disease, with long-term current use of insulin  (HCC) Assessment & Plan: Patient has been doing well with current regimen he continues with metformin  in the morning, Farxiga , insulin  glargine.  Daughter did decrease dose of insulin  glargine from 76 units down to 74 units daily given that he had an episode where he woke up 1 morning and seemed to be out of it..  She thought this may have been related to his blood sugar, however did not check her blood sugar at that time.  Fasting blood sugars have generally been controlled reportedly, do not have specific numbers to review.  Most recent hemoglobin A1c showed borderline control at 7.9 %.  He generally has had fairly good control of A1c over the past couple years.  Is due for recheck of hemoglobin A1c at this time. We will proceed with recheck of hemoglobin A1c today.  Hemoglobin A1c has slightly increased further, now at 8.0%.  This continues to be above goal, however hesitant to make significant medication changes due to reported episode by daughter.  Recommend continuing with current medication regimen and focusing more on lifestyle modification/dietary adjustments.  If any further episodes occur, would check blood sugar at that time in order to have further information on whether blood sugar is playing a role with these acute changes. No changes to medication regimen currently.  Orders: -     POCT glycosylated hemoglobin (Hb A1C)  Return in about 3 months (around 01/24/2024) for  diabetes.   ___________________________________________ Nastasia Kage de [place], MD, ABFM, CAQSM Primary Care and Sports Medicine [facility]

## 2023-10-27 ENCOUNTER — Encounter (HOSPITAL_BASED_OUTPATIENT_CLINIC_OR_DEPARTMENT_OTHER): Payer: Self-pay | Admitting: *Deleted

## 2023-11-04 ENCOUNTER — Other Ambulatory Visit (HOSPITAL_BASED_OUTPATIENT_CLINIC_OR_DEPARTMENT_OTHER): Payer: Self-pay | Admitting: Family Medicine

## 2023-11-04 DIAGNOSIS — I10 Essential (primary) hypertension: Secondary | ICD-10-CM

## 2023-11-19 ENCOUNTER — Other Ambulatory Visit: Payer: Self-pay | Admitting: Cardiology

## 2023-11-22 NOTE — Telephone Encounter (Signed)
This is a [facility] pr. Dr. Bing Matter pt

## 2023-12-11 ENCOUNTER — Ambulatory Visit (INDEPENDENT_AMBULATORY_CARE_PROVIDER_SITE_OTHER): Payer: Medicare Other

## 2023-12-11 DIAGNOSIS — I420 Dilated cardiomyopathy: Secondary | ICD-10-CM

## 2023-12-11 DIAGNOSIS — I5020 Unspecified systolic (congestive) heart failure: Secondary | ICD-10-CM | POA: Diagnosis not present

## 2023-12-12 LAB — CUP PACEART REMOTE DEVICE CHECK
Battery Remaining Longevity: 76 mo
Battery Voltage: 2.99 V
Brady Statistic AP VP Percent: 44.22 %
Brady Statistic AP VS Percent: 0.55 %
Brady Statistic AS VP Percent: 54.4 %
Brady Statistic AS VS Percent: 0.83 %
Brady Statistic RA Percent Paced: 44.52 %
Brady Statistic RV Percent Paced: 47.56 %
Date Time Interrogation Session: 20250217044224
HighPow Impedance: 62 Ohm
Implantable Lead Connection Status: 753985
Implantable Lead Connection Status: 753985
Implantable Lead Connection Status: 753985
Implantable Lead Implant Date: 20221121
Implantable Lead Implant Date: 20221121
Implantable Lead Implant Date: 20221121
Implantable Lead Location: 753858
Implantable Lead Location: 753859
Implantable Lead Location: 753860
Implantable Lead Model: 4598
Implantable Lead Model: 5076
Implantable Pulse Generator Implant Date: 20221121
Lead Channel Impedance Value: 145.871
Lead Channel Impedance Value: 145.871
Lead Channel Impedance Value: 149.625
Lead Channel Impedance Value: 166.114
Lead Channel Impedance Value: 166.114
Lead Channel Impedance Value: 266 Ohm
Lead Channel Impedance Value: 304 Ohm
Lead Channel Impedance Value: 323 Ohm
Lead Channel Impedance Value: 323 Ohm
Lead Channel Impedance Value: 323 Ohm
Lead Channel Impedance Value: 342 Ohm
Lead Channel Impedance Value: 380 Ohm
Lead Channel Impedance Value: 399 Ohm
Lead Channel Impedance Value: 494 Ohm
Lead Channel Impedance Value: 494 Ohm
Lead Channel Impedance Value: 532 Ohm
Lead Channel Impedance Value: 570 Ohm
Lead Channel Impedance Value: 570 Ohm
Lead Channel Pacing Threshold Amplitude: 0.625 V
Lead Channel Pacing Threshold Amplitude: 0.75 V
Lead Channel Pacing Threshold Amplitude: 0.75 V
Lead Channel Pacing Threshold Pulse Width: 0.4 ms
Lead Channel Pacing Threshold Pulse Width: 0.4 ms
Lead Channel Pacing Threshold Pulse Width: 0.4 ms
Lead Channel Sensing Intrinsic Amplitude: 1.5 mV
Lead Channel Sensing Intrinsic Amplitude: 1.5 mV
Lead Channel Sensing Intrinsic Amplitude: 25.125 mV
Lead Channel Sensing Intrinsic Amplitude: 25.125 mV
Lead Channel Setting Pacing Amplitude: 1.5 V
Lead Channel Setting Pacing Amplitude: 1.5 V
Lead Channel Setting Pacing Amplitude: 1.75 V
Lead Channel Setting Pacing Pulse Width: 0.4 ms
Lead Channel Setting Pacing Pulse Width: 0.4 ms
Lead Channel Setting Sensing Sensitivity: 0.3 mV
Zone Setting Status: 755011
Zone Setting Status: 755011

## 2023-12-20 ENCOUNTER — Other Ambulatory Visit: Payer: Self-pay | Admitting: *Deleted

## 2023-12-25 ENCOUNTER — Other Ambulatory Visit: Payer: Self-pay | Admitting: Cardiology

## 2023-12-27 ENCOUNTER — Other Ambulatory Visit: Payer: Self-pay | Admitting: Cardiology

## 2023-12-28 ENCOUNTER — Inpatient Hospital Stay: Payer: Medicare Other | Attending: Oncology

## 2023-12-28 ENCOUNTER — Other Ambulatory Visit: Payer: Self-pay

## 2023-12-28 DIAGNOSIS — N138 Other obstructive and reflux uropathy: Secondary | ICD-10-CM

## 2023-12-28 DIAGNOSIS — Z79899 Other long term (current) drug therapy: Secondary | ICD-10-CM | POA: Insufficient documentation

## 2023-12-28 LAB — CBC WITH DIFFERENTIAL (CANCER CENTER ONLY)
Abs Immature Granulocytes: 0.04 10*3/uL (ref 0.00–0.07)
Basophils Absolute: 0 10*3/uL (ref 0.0–0.1)
Basophils Relative: 0 %
Eosinophils Absolute: 0.2 10*3/uL (ref 0.0–0.5)
Eosinophils Relative: 2 %
HCT: 46 % (ref 39.0–52.0)
Hemoglobin: 16.5 g/dL (ref 13.0–17.0)
Immature Granulocytes: 1 %
Lymphocytes Relative: 24 %
Lymphs Abs: 1.7 10*3/uL (ref 0.7–4.0)
MCH: 33.1 pg (ref 26.0–34.0)
MCHC: 35.9 g/dL (ref 30.0–36.0)
MCV: 92.2 fL (ref 80.0–100.0)
Monocytes Absolute: 0.7 10*3/uL (ref 0.1–1.0)
Monocytes Relative: 9 %
Neutro Abs: 4.6 10*3/uL (ref 1.7–7.7)
Neutrophils Relative %: 64 %
Platelet Count: 181 10*3/uL (ref 150–400)
RBC: 4.99 MIL/uL (ref 4.22–5.81)
RDW: 13 % (ref 11.5–15.5)
WBC Count: 7.2 10*3/uL (ref 4.0–10.5)
nRBC: 0 % (ref 0.0–0.2)

## 2023-12-28 LAB — FERRITIN: Ferritin: 228 ng/mL (ref 24–336)

## 2023-12-28 MED ORDER — TAMSULOSIN HCL 0.4 MG PO CAPS: ORAL_CAPSULE | ORAL | 1 refills | Status: DC

## 2023-12-29 ENCOUNTER — Telehealth: Payer: Self-pay | Admitting: *Deleted

## 2023-12-29 NOTE — Telephone Encounter (Signed)
 LVM for daughter to call office to discuss lab results and MD recommendations.

## 2023-12-29 NOTE — Telephone Encounter (Signed)
-----   Message from Thornton Papas sent at 12/28/2023  3:16 PM EST ----- Please call daughter, the ferritin level is higher again today, I recommend phlebotomy every 2 weeks for 2 treatments, we can forego the phlebotomy if his dementia has become more severe, follow-up as scheduled, let me know dates of phlebotomy and I will put orders in

## 2024-01-01 ENCOUNTER — Telehealth: Payer: Self-pay | Admitting: *Deleted

## 2024-01-01 NOTE — Telephone Encounter (Signed)
 Called daughter x 2 without success to discuss labs and MD recommendation. Sent MyChart message as well.

## 2024-01-02 ENCOUNTER — Telehealth: Payer: Self-pay | Admitting: Oncology

## 2024-01-02 NOTE — Telephone Encounter (Signed)
 Spoke with patient daughter confirming upcoming appointment

## 2024-01-04 ENCOUNTER — Inpatient Hospital Stay

## 2024-01-04 ENCOUNTER — Other Ambulatory Visit: Payer: Self-pay | Admitting: Nurse Practitioner

## 2024-01-04 DIAGNOSIS — Z79899 Other long term (current) drug therapy: Secondary | ICD-10-CM | POA: Diagnosis not present

## 2024-01-04 NOTE — Patient Instructions (Signed)

## 2024-01-04 NOTE — Progress Notes (Signed)
 Brett [name] presents today for phlebotomy per MD orders. Phlebotomy procedure started at 0848 with 16G in Left AC and ended at 0856. 558 grams removed. Patient observed for 30 minutes after procedure without any incident. Drink provided, declined snack. Patient tolerated procedure well. IV needle removed intact.

## 2024-01-18 ENCOUNTER — Inpatient Hospital Stay

## 2024-01-18 DIAGNOSIS — Z79899 Other long term (current) drug therapy: Secondary | ICD-10-CM | POA: Diagnosis not present

## 2024-01-18 NOTE — Patient Instructions (Signed)

## 2024-01-18 NOTE — Progress Notes (Signed)
 Brett [name] presents today for phlebotomy per MD orders. Phlebotomy procedure started at 1450 and ended at 1455. 512 grams removed. Phlebotomy kit used. Food and fluids given after procedure. Patient observed for 30 minutes after procedure without any incident. Patient tolerated procedure well. IV needle removed intact. Pt. left via ambulation, no respiratory distress noted.

## 2024-01-19 NOTE — Addendum Note (Signed)
 Addended by: Geralyn Flash D on: 01/19/2024 01:19 PM   Modules accepted: Orders

## 2024-01-19 NOTE — Progress Notes (Signed)
 Remote ICD transmission.

## 2024-01-21 ENCOUNTER — Other Ambulatory Visit: Payer: Self-pay | Admitting: Cardiology

## 2024-01-23 ENCOUNTER — Encounter (HOSPITAL_BASED_OUTPATIENT_CLINIC_OR_DEPARTMENT_OTHER): Payer: Self-pay

## 2024-01-24 ENCOUNTER — Encounter (HOSPITAL_BASED_OUTPATIENT_CLINIC_OR_DEPARTMENT_OTHER)

## 2024-01-24 ENCOUNTER — Telehealth (HOSPITAL_BASED_OUTPATIENT_CLINIC_OR_DEPARTMENT_OTHER): Payer: Self-pay | Admitting: *Deleted

## 2024-01-24 NOTE — Telephone Encounter (Signed)
 LVM for [name] pt daughter to call the office in regards to [employer]

## 2024-01-26 ENCOUNTER — Encounter (HOSPITAL_BASED_OUTPATIENT_CLINIC_OR_DEPARTMENT_OTHER): Payer: Medicare Other | Admitting: Family Medicine

## 2024-01-29 ENCOUNTER — Ambulatory Visit (HOSPITAL_BASED_OUTPATIENT_CLINIC_OR_DEPARTMENT_OTHER): Payer: Medicare Other

## 2024-01-29 ENCOUNTER — Encounter (HOSPITAL_BASED_OUTPATIENT_CLINIC_OR_DEPARTMENT_OTHER): Payer: Medicare Other | Admitting: Family Medicine

## 2024-01-31 ENCOUNTER — Other Ambulatory Visit (HOSPITAL_BASED_OUTPATIENT_CLINIC_OR_DEPARTMENT_OTHER): Payer: Self-pay | Admitting: Family Medicine

## 2024-01-31 DIAGNOSIS — E1122 Type 2 diabetes mellitus with diabetic chronic kidney disease: Secondary | ICD-10-CM

## 2024-02-01 ENCOUNTER — Encounter: Payer: Self-pay | Admitting: Physician Assistant

## 2024-02-01 ENCOUNTER — Ambulatory Visit (INDEPENDENT_AMBULATORY_CARE_PROVIDER_SITE_OTHER): Payer: Medicare Other | Admitting: Physician Assistant

## 2024-02-01 VITALS — BP 120/70 | HR 78 | Resp 20 | Ht 65.0 in | Wt 201.0 lb

## 2024-02-01 DIAGNOSIS — F0280 Dementia in other diseases classified elsewhere without behavioral disturbance: Secondary | ICD-10-CM

## 2024-02-01 DIAGNOSIS — G309 Alzheimer's disease, unspecified: Secondary | ICD-10-CM | POA: Diagnosis not present

## 2024-02-01 NOTE — Patient Instructions (Signed)
 Good to see you.  Continue Memantine 10mg  1 tablet twice a day  2. Continue to monitor driving  3. Recommend looking into joining adult day programs such as [facility]   4. Follow-up in 6 months     Check this website : Dementia Success Path   for tips on how to deal with certain situations   FALL PRECAUTIONS: Be cautious when walking. Scan the area for obstacles that may increase the risk of trips and falls. When getting up in the mornings, sit up at the edge of the bed for a few minutes before getting out of bed. Consider elevating the bed at the head end to avoid drop of blood pressure when getting up. Walk always in a well-lit room (use night lights in the walls). Avoid area rugs or power cords from appliances in the middle of the walkways. Use a walker or a cane if necessary and consider physical therapy for balance exercise. Get your eyesight checked regularly.  FINANCIAL OVERSIGHT: Supervision, especially oversight when making financial decisions or transactions is also recommended.  HOME SAFETY: Consider the safety of the kitchen when operating appliances like stoves, microwave oven, and blender. Consider having supervision and share cooking responsibilities until no longer able to participate in those. Accidents with firearms and other hazards in the house should be identified and addressed as well.  DRIVING: Regarding driving, in patients with progressive memory problems, driving will be impaired. We advise to have someone else do the driving if trouble finding directions or if minor accidents are reported. Independent driving assessment is available to determine safety of driving.  ABILITY TO BE LEFT ALONE: If patient is unable to contact 911 operator, consider using LifeLine, or when the need is there, arrange for someone to stay with patients. Smoking is a fire hazard, consider supervision or cessation. Risk of wandering should be assessed by caregiver and if detected at any point,  supervision and safe proof recommendations should be instituted.  MEDICATION SUPERVISION: Inability to self-administer medication needs to be constantly addressed. Implement a mechanism to ensure safe administration of the medications.  RECOMMENDATIONS FOR ALL PATIENTS WITH MEMORY PROBLEMS: 1. Continue to exercise (Recommend 30 minutes of walking everyday, or 3 hours every week) 2. Increase social interactions - continue going to [place] and enjoy social gatherings with friends and family 3. Eat healthy, avoid fried foods and eat more fruits and vegetables 4. Maintain adequate blood pressure, blood sugar, and blood cholesterol level. Reducing the risk of stroke and cardiovascular disease also helps promoting better memory. 5. Avoid stressful situations. Live a simple life and avoid aggravations. Organize your time and prepare for the next day in anticipation. 6. Sleep well, avoid any interruptions of sleep and avoid any distractions in the bedroom that may interfere with adequate sleep quality 7. Avoid sugar, avoid sweets as there is a strong link between excessive sugar intake, diabetes, and cognitive impairment The Mediterranean diet has been shown to help patients reduce the risk of progressive memory disorders and reduces cardiovascular risk. This includes eating fish, eat fruits and green leafy vegetables, nuts like almonds and hazelnuts, walnuts, and also use olive oil. Avoid fast foods and fried foods as much as possible. Avoid sweets and sugar as sugar use has been linked to worsening of memory function.  There is always a concern of gradual progression of memory problems. If this is the case, then we may need to adjust level of care according to patient needs. Support, both to the patient and  caregiver, should then be put into place.  Labs today [address]

## 2024-02-01 NOTE — Progress Notes (Signed)
 Assessment/Plan:   Dementia likely due to Alzheimer disease     Brett [name] is a very pleasant 78 y.o. 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 Patient is currently on memantine 10 mg twice daily (bradycardia).  Mild cognitive decline is noted, but unfortunately, he is unable to tolerate ACHI to slow heart rate.  He may need more activities during the day, for social and cognitive stimulation.  We discussed the role of couple day programs, his daughter is going to look into this.    Follow up in 6 months. Continue memantine 10 mg twice daily, side effects discussed (bradycardia) Recommend good control of her cardiovascular risk factors Continue to control mood as per PCP Monitor driving Recommend adult day programs for socialization and cognitive stimulation    Subjective:    This patient is accompanied in the office by his daughter who supplements the history.  Previous records as well as any outside records available were reviewed prior to todays visit. Patient was last seen on 08/03/2023, last MMSE in February 2024 was 20/30    Any changes in memory since last visit? "  Worse "-daughter says.  He forgets conversations, forgets what he is doing.  Does not do much around her house, sitting around, does not want to leave the house.  He speaks [place] more than before daughter says. He lost a tax form and could not remember he is down SS and DOB. He starts a tasks bu but gets overwhelmed and abandons the task repeats oneself?  Endorsed, frequently Disoriented when walking into a room?  Patient denies inside of the house, outside he may become more disoriented. He likes sitting at the front porch.    Leaving objects?  May  misplace things such as tools but not in unusual places   Wandering behavior?  Sometimes he goes to the store to get something to eat, "especially donuts Any personality changes since last visit?  denies, but he may become defensive and argumentative if memory issues are addressed. Any worsening depression?:  Denies.   Hallucinations or paranoia?  Denies.   Seizures? denies    Any sleep changes?  Does not sleep well because he sleeps during the day. Denies vivid dreams, REM behavior or sleepwalking   Sleep apnea?   Endorsed, uses CPAP. Any hygiene concerns?  Needs reminder.  Independent of bathing and dressing?  Endorsed , may wear same T shirt over a few days Does the patient needs help with medications?  Daughter is in Who is in charge of the finances?  Daughter is in     Any changes in appetite?  He eats very well.  He may forget that he ate and eats again.He does not drink enough water   Patient have trouble swallowing? Denies.   Does the patient cook? No Any headaches?   denies   Chronic back pain  denies   Ambulates with difficulty? Denies, but slower and smaller steps.    Recent falls or head injuries? denies     Unilateral weakness, numbness or tingling? denies   Any tremors?  Denies   Any anosmia?  Endorsed for the last year Any incontinence of urine?  Endorsed, wears diapers Any bowel dysfunction?   Constipation , takes prune juice  Patient lives with his daughter  Does the patient drive?  Only very short distances, he denies getting lost.   History on Initial Assessment 12/21/2021: This is a pleasant 78 year old right-handed man with a history of hypertension, hyperlipidemia, MD, gout, CHF, s/p PPM, presenting for evaluation of memory loss. He feels his memory is "so-so, not great but not that bad." His daughter Brett [name] is present to provide additional history. Brett [name] moved from [place] last August 2022 to help him after his cardiac issues last year. Since she moved in, she  started noticing progressive memory changes. There is a lot of repetitive questions, same conversations. He gets the days confused and would not be able to keep up with his doctors appointments. He was previously managing his own medications and denies missing doses, Brett [name] started to help in August, but recently started putting his medications in a pillbox because he was getting confused with the bottles. He remembers to take them but she has to remind him sometimes. His last HbA1c was 7.2. He was managing finances without difficulties, she now writes the checks and he signs them. He does not cook. He got lost driving around 3-4 months ago, he was 30 minutes away from home but was driving around for 3 hours and the police pulled him over. Glucose level at that time was 66. He has been driving very little since then. He denies misplacing things but loses the computer mouse or would disable the camera and Brett [name] has to figure out what he had pressed when he says it is not his fault. He is mostly on his laptop during the day watching "junk." He would refuse to go outside with family or walk around, giving his pacemaker/cardiac issues as a reason even if he has been cleared for exercise by his cardiologist. He is able to dress himself. He does not bathe regularly, only when he has a doctors appointment. There is no paranoia or hallucinations, but he gets easily irritated when Brett [name] tries to get him to do something. He reports sleep is good, but Brett [name] shakes her head. He fell a week ago, he got lost in the house to use the bathroom, and fell down 6 steps. He denies any headaches, dizziness, diplopia, dysarthria/dysphagia, neck/back pain. He has occasional left hand numbness and tingling. He has bowel and bladder incontinence and wears Depends, Brett [name] also feels this causes him to isolate himself. He reports mood is good. Brett [name] reports hearing loss, as well as decreased ability to smell and taste. He has mild hand tremors  that do not affect daily activities. He is currently living with Brett [name] and her boyfriend. His other daughter also lives in [place]. There is no clear family history of dementia, no significant head injuries. He stopped drinking alcohol since his heart issues started.  PREVIOUS MEDICATIONS:   CURRENT MEDICATIONS:  Outpatient Encounter Medications as of 02/01/2024  Medication Sig   Alcohol Swabs (ALCOHOL PREP) PADS Clean injection site before each injection (Patient taking differently: 1 each by Other route See admin instructions. Clean injection site before each injection)   allopurinol (ZYLOPRIM) 300 MG tablet TAKE 1 TABLET BY MOUTH ONCE  DAILY TO PREVENT GOUT   aspirin 81 MG tablet Take 81 mg by mouth daily.   atorvastatin (LIPITOR) 20 MG tablet TAKE 1 TABLET BY MOUTH DAILY FOR CHOLESTEROL   bisoprolol (ZEBETA) 10 MG tablet Take 1 tablet (10 mg total) by mouth daily. 1rst attempt, patient needs and appt for additional refills  Cholecalciferol (VITAMIN D PO) Take 5,000 Units by mouth daily.   D-Mannose 500 MG CAPS Take 1,500 mg by mouth daily.   famotidine (PEPCID) 40 MG tablet Take 40 mg by mouth as needed for heartburn or indigestion.   FARXIGA 10 MG TABS tablet TAKE 1 TABLET BY MOUTH DAILY   furosemide (LASIX) 20 MG tablet TAKE 1 TABLET BY MOUTH DAILY  NEEDS FOLLOW UP APPOINTMENT FOR  MORE REFILLS   Loratadine (CLARITIN PO) Take 1 tablet by mouth as needed (allergies). Unknown strength   magnesium oxide (MAG-OX) 400 MG tablet Take 400 mg by mouth daily.   memantine (NAMENDA) 10 MG tablet TAKE 1 TABLET BY MOUTH TWICE  DAILY   metFORMIN (GLUCOPHAGE-XR) 500 MG 24 hr tablet TAKE 2 TABLETS BY MOUTH TWICE  DAILY WITH MEALS FOR DIABETES   sacubitril-valsartan (ENTRESTO) 97-103 MG Take 1 tablet by mouth 2 (two) times daily. 1rst attempt, patient needs an appt for additional refills   sharps container 1 each by Does not apply route as needed.   spironolactone (ALDACTONE) 25 MG tablet Take 1  tablet (25 mg total) by mouth daily. 1rst attempt, patient needs an appt for additional refills   tamsulosin (FLOMAX) 0.4 MG CAPS capsule TAKE 1 CAPSULE BY MOUTH AT  BEDTIME FOR PROSTATE   TOUJEO MAX SOLOSTAR 300 UNIT/ML Solostar Pen INJECT SUBCUTANEOUSLY 80 UNITS  DAILY   [DISCONTINUED] metFORMIN (GLUCOPHAGE-XR) 500 MG 24 hr tablet TAKE 2 TABLETS BY MOUTH TWICE  DAILY WITH MEALS FOR DIABETES (Patient taking differently: Take 1,000 mg by mouth daily with breakfast.)   No facility-administered encounter medications on file as of 02/01/2024.       12/22/2022   10:00 AM 12/17/2019   11:14 AM  MMSE - Mini Mental State Exam  Orientation to time 5 5  Orientation to Place 4 5  Registration 3 3  Attention/ Calculation 0 5  Recall 0 3  Language- name 2 objects 2 2  Language- repeat 1 1  Language- follow 3 step command 3 3  Language- read & follow direction 1 1  Write a sentence 1 1  Copy design 0 1  Total score 20 30      12/21/2021    9:00 AM  Montreal Cognitive Assessment   Visuospatial/ Executive (0/5) 1  Naming (0/3) 2  Attention: Read list of digits (0/2) 0  Attention: Read list of letters (0/1) 0  Attention: Serial 7 subtraction starting at 100 (0/3) 3  Language: Repeat phrase (0/2) 0  Language : Fluency (0/1) 0  Abstraction (0/2) 0  Delayed Recall (0/5) 0  Orientation (0/6) 5  Total 11  Adjusted Score (based on education) 12    Objective:     PHYSICAL EXAMINATION:    VITALS:   Vitals:   02/01/24 1509  BP: 120/70  Pulse: 78  Resp: 20  Weight: 201 lb (91.2 kg)  Height: 5\' 5"  (1.651 m)    GEN:  The patient appears stated age and is in NAD. HEENT:  Normocephalic, atraumatic.   Neurological examination:  General: NAD, well-groomed, appears stated age. Orientation: The patient is alert. Oriented to person, not to place and date Cranial nerves: There is good facial symmetry.The speech is fluent and clear. No aphasia or dysarthria. Fund of knowledge is reduced.  Recent and remote memory are impaired. Attention and concentration are reduced.  Able to name objects and repeat phrases.  Hearing is intact to conversational tone.   Sensation: Sensation is intact to light touch throughout  Motor: Strength is at least antigravity x4. DTR's 2/4 in UE/LE     Movement examination: Tone: There is normal tone in the UE/LE Abnormal movements:  no tremor.  No myoclonus.  No asterixis.   Coordination:  There is no decremation with RAM's. Normal finger to nose  Gait and Station: The patient has no  difficulty arising out of a deep-seated chair without the use of the hands. The patient's stride length is good.  Gait is cautious and narrow.    Thank you for allowing [place] the opportunity to participate in the care of this nice patient. Please do not hesitate to contact us for any questions or concerns.   Total time spent on today's visit was 32 minutes dedicated to this patient today, preparing to see patient, examining the patient, ordering tests and/or medications and counseling the patient, documenting clinical information in the EHR or other health record, independently interpreting results and communicating results to the patient/family, discussing treatment and goals, answering patient's questions and coordinating care.  Cc:  de [place], Raymond J, MD  Marlowe Kays 02/01/2024 6:06 PM

## 2024-02-05 NOTE — Progress Notes (Signed)
 We have reached out to patient to schedule appt LVM

## 2024-02-05 NOTE — Progress Notes (Signed)
 [facility] Quality Team Note  Name: Brett [name] Date of Birth: 1946/08/16 MRN: [id number] Date: 02/05/2024  [facility] Quality Team has reviewed this patient's chart, please see recommendations below:  [facility] Quality Other; (PLEASE SCHEDULE PATIENT FOR KED LABS. PATIENT NEEDS URINE MICROALBUMIN/CREATININE TEST AS WELL AS eGFR FOR GAP CLOSURE)

## 2024-02-08 ENCOUNTER — Ambulatory Visit (HOSPITAL_BASED_OUTPATIENT_CLINIC_OR_DEPARTMENT_OTHER)

## 2024-02-09 ENCOUNTER — Ambulatory Visit (HOSPITAL_BASED_OUTPATIENT_CLINIC_OR_DEPARTMENT_OTHER)

## 2024-03-11 ENCOUNTER — Ambulatory Visit (INDEPENDENT_AMBULATORY_CARE_PROVIDER_SITE_OTHER): Payer: PRIVATE HEALTH INSURANCE

## 2024-03-11 DIAGNOSIS — I420 Dilated cardiomyopathy: Secondary | ICD-10-CM

## 2024-03-11 DIAGNOSIS — I5020 Unspecified systolic (congestive) heart failure: Secondary | ICD-10-CM

## 2024-03-12 LAB — CUP PACEART REMOTE DEVICE CHECK
Battery Remaining Longevity: 69 mo
Battery Voltage: 2.98 V
Brady Statistic AP VP Percent: 44.46 %
Brady Statistic AP VS Percent: 0.63 %
Brady Statistic AS VP Percent: 53.82 %
Brady Statistic AS VS Percent: 1.09 %
Brady Statistic RA Percent Paced: 43.98 %
Brady Statistic RV Percent Paced: 39.75 %
Date Time Interrogation Session: 20250519002203
HighPow Impedance: 69 Ohm
Implantable Lead Connection Status: 753985
Implantable Lead Connection Status: 753985
Implantable Lead Connection Status: 753985
Implantable Lead Implant Date: 20221121
Implantable Lead Implant Date: 20221121
Implantable Lead Implant Date: 20221121
Implantable Lead Location: 753858
Implantable Lead Location: 753859
Implantable Lead Location: 753860
Implantable Lead Model: 4598
Implantable Lead Model: 5076
Implantable Pulse Generator Implant Date: 20221121
Lead Channel Impedance Value: 145.871
Lead Channel Impedance Value: 145.871
Lead Channel Impedance Value: 156.471
Lead Channel Impedance Value: 174.595
Lead Channel Impedance Value: 174.595
Lead Channel Impedance Value: 266 Ohm
Lead Channel Impedance Value: 323 Ohm
Lead Channel Impedance Value: 323 Ohm
Lead Channel Impedance Value: 323 Ohm
Lead Channel Impedance Value: 380 Ohm
Lead Channel Impedance Value: 380 Ohm
Lead Channel Impedance Value: 399 Ohm
Lead Channel Impedance Value: 437 Ohm
Lead Channel Impedance Value: 494 Ohm
Lead Channel Impedance Value: 532 Ohm
Lead Channel Impedance Value: 570 Ohm
Lead Channel Impedance Value: 589 Ohm
Lead Channel Impedance Value: 589 Ohm
Lead Channel Pacing Threshold Amplitude: 0.625 V
Lead Channel Pacing Threshold Amplitude: 0.875 V
Lead Channel Pacing Threshold Amplitude: 0.875 V
Lead Channel Pacing Threshold Pulse Width: 0.4 ms
Lead Channel Pacing Threshold Pulse Width: 0.4 ms
Lead Channel Pacing Threshold Pulse Width: 0.4 ms
Lead Channel Sensing Intrinsic Amplitude: 1.625 mV
Lead Channel Sensing Intrinsic Amplitude: 1.625 mV
Lead Channel Sensing Intrinsic Amplitude: 18 mV
Lead Channel Sensing Intrinsic Amplitude: 18 mV
Lead Channel Setting Pacing Amplitude: 1.5 V
Lead Channel Setting Pacing Amplitude: 1.5 V
Lead Channel Setting Pacing Amplitude: 2 V
Lead Channel Setting Pacing Pulse Width: 0.4 ms
Lead Channel Setting Pacing Pulse Width: 0.4 ms
Lead Channel Setting Sensing Sensitivity: 0.3 mV
Zone Setting Status: 755011
Zone Setting Status: 755011

## 2024-03-13 ENCOUNTER — Ambulatory Visit: Payer: Self-pay | Admitting: Cardiology

## 2024-03-21 ENCOUNTER — Other Ambulatory Visit: Payer: Self-pay | Admitting: Cardiology

## 2024-03-23 ENCOUNTER — Other Ambulatory Visit (HOSPITAL_BASED_OUTPATIENT_CLINIC_OR_DEPARTMENT_OTHER): Payer: Self-pay | Admitting: Family Medicine

## 2024-03-23 ENCOUNTER — Other Ambulatory Visit: Payer: Self-pay | Admitting: Cardiology

## 2024-03-23 DIAGNOSIS — R32 Unspecified urinary incontinence: Secondary | ICD-10-CM

## 2024-03-31 ENCOUNTER — Other Ambulatory Visit: Payer: Self-pay | Admitting: Family

## 2024-03-31 DIAGNOSIS — N138 Other obstructive and reflux uropathy: Secondary | ICD-10-CM

## 2024-04-01 ENCOUNTER — Encounter (INDEPENDENT_AMBULATORY_CARE_PROVIDER_SITE_OTHER): Payer: Medicare Other | Admitting: Ophthalmology

## 2024-04-01 DIAGNOSIS — I10 Essential (primary) hypertension: Secondary | ICD-10-CM

## 2024-04-01 DIAGNOSIS — H35373 Puckering of macula, bilateral: Secondary | ICD-10-CM | POA: Diagnosis not present

## 2024-04-01 DIAGNOSIS — H43813 Vitreous degeneration, bilateral: Secondary | ICD-10-CM | POA: Diagnosis not present

## 2024-04-01 DIAGNOSIS — H35033 Hypertensive retinopathy, bilateral: Secondary | ICD-10-CM | POA: Diagnosis not present

## 2024-04-01 DIAGNOSIS — E113393 Type 2 diabetes mellitus with moderate nonproliferative diabetic retinopathy without macular edema, bilateral: Secondary | ICD-10-CM

## 2024-04-01 DIAGNOSIS — Z7984 Long term (current) use of oral hypoglycemic drugs: Secondary | ICD-10-CM | POA: Diagnosis not present

## 2024-05-01 NOTE — Addendum Note (Signed)
 Addended by: TAWNI DRILLING D on: 05/01/2024 01:44 PM   Modules accepted: Orders

## 2024-05-01 NOTE — Progress Notes (Signed)
 Remote ICD transmission.

## 2024-05-06 ENCOUNTER — Other Ambulatory Visit: Payer: Self-pay | Admitting: Cardiology

## 2024-05-15 ENCOUNTER — Encounter (HOSPITAL_BASED_OUTPATIENT_CLINIC_OR_DEPARTMENT_OTHER): Payer: Self-pay | Admitting: *Deleted

## 2024-06-06 ENCOUNTER — Ambulatory Visit (HOSPITAL_BASED_OUTPATIENT_CLINIC_OR_DEPARTMENT_OTHER): Admitting: Family Medicine

## 2024-06-06 VITALS — BP 143/81 | HR 63 | Temp 98.3°F | Ht 65.0 in | Wt 202.3 lb

## 2024-06-06 DIAGNOSIS — E1122 Type 2 diabetes mellitus with diabetic chronic kidney disease: Secondary | ICD-10-CM

## 2024-06-06 DIAGNOSIS — N182 Chronic kidney disease, stage 2 (mild): Secondary | ICD-10-CM

## 2024-06-06 DIAGNOSIS — Z794 Long term (current) use of insulin: Secondary | ICD-10-CM | POA: Diagnosis not present

## 2024-06-06 MED ORDER — METFORMIN HCL ER 500 MG PO TB24: 1000.0000 mg | ORAL_TABLET | Freq: Every day | ORAL | 2 refills | Status: AC

## 2024-06-06 NOTE — Progress Notes (Unsigned)
    Procedures performed today:    None.  Independent interpretation of notes and tests performed by another provider:   None.  Brief History, Exam, Impression, and Recommendations:    BP (!) 143/81 (BP Location: Left Arm, Patient Position: Sitting, Cuff Size: Normal)   Pulse 63   Temp 98.3 F (36.8 C) (Oral)   Ht 5' 5 (1.651 m)   Wt 202 lb 4.8 oz (91.8 kg)   BMI 33.66 kg/m   Type 2 diabetes mellitus with stage 2 chronic kidney disease, with long-term current use of insulin (HCC) Assessment & Plan: Patient has been doing well with current regimen he continues with metformin in the morning, Farxiga, insulin glargine.  Patient continue with insulin glargine 74 units daily.  Most recent hemoglobin A1c was borderline at 8.0%.  Is due for recheck of hemoglobin A1c at this time. We will proceed with recheck of hemoglobin A1c today. Recommend continuing with current medication regimen and focusing more on lifestyle modification/dietary adjustments. Consideration for possibly adjusting current medication regimen.  Could consider utilizing once weekly GLP-1 receptor agonist which may also allow for lowering dose of insulin  Orders: -     Hemoglobin A1c -     Basic metabolic panel with GFR -     Microalbumin / creatinine urine ratio -     metFORMIN HCl ER; Take 2 tablets (1,000 mg total) by mouth daily with breakfast.  Dispense: 200 tablet; Refill: 2  Return in about 4 months (around 10/06/2024) for diabetes.   ___________________________________________ [place] Warriner de [place], MD, ABFM, CAQSM Primary Care and Sports [facility]

## 2024-06-07 LAB — BASIC METABOLIC PANEL WITH GFR
BUN/Creatinine Ratio: 18 (ref 10–24)
BUN: 24 mg/dL (ref 8–27)
CO2: 20 mmol/L (ref 20–29)
Calcium: 9.2 mg/dL (ref 8.6–10.2)
Chloride: 101 mmol/L (ref 96–106)
Creatinine, Ser: 1.32 mg/dL — ABNORMAL HIGH (ref 0.76–1.27)
Glucose: 284 mg/dL — ABNORMAL HIGH (ref 70–99)
Potassium: 4.4 mmol/L (ref 3.5–5.2)
Sodium: 139 mmol/L (ref 134–144)
eGFR: 56 mL/min/1.73 — ABNORMAL LOW (ref >59)

## 2024-06-07 LAB — HEMOGLOBIN A1C
Est. average glucose Bld gHb Est-mCnc: 186 mg/dL
Hgb A1c MFr Bld: 8.1 % — ABNORMAL HIGH (ref 4.8–5.6)

## 2024-06-07 LAB — MICROALBUMIN / CREATININE URINE RATIO
Creatinine, Urine: 46.8 mg/dL
Microalb/Creat Ratio: <6 mg/g{creat} (ref 0–29)
Microalbumin, Urine: <3 ug/mL

## 2024-06-10 ENCOUNTER — Ambulatory Visit (INDEPENDENT_AMBULATORY_CARE_PROVIDER_SITE_OTHER): Payer: Medicare Other

## 2024-06-10 ENCOUNTER — Ambulatory Visit (HOSPITAL_BASED_OUTPATIENT_CLINIC_OR_DEPARTMENT_OTHER): Payer: Self-pay | Admitting: Family Medicine

## 2024-06-10 DIAGNOSIS — I420 Dilated cardiomyopathy: Secondary | ICD-10-CM

## 2024-06-10 NOTE — Assessment & Plan Note (Signed)
 Patient has been doing well with current regimen he continues with metformin  in the morning, Farxiga , insulin  glargine.  Patient continue with insulin  glargine 74 units daily.  Most recent hemoglobin A1c was borderline at 8.0%.  Is due for recheck of hemoglobin A1c at this time. We will proceed with recheck of hemoglobin A1c today. Recommend continuing with current medication regimen and focusing more on lifestyle modification/dietary adjustments. Consideration for possibly adjusting current medication regimen.  Could consider utilizing once weekly GLP-1 receptor agonist which may also allow for lowering dose of insulin 

## 2024-06-11 LAB — CUP PACEART REMOTE DEVICE CHECK
Battery Remaining Longevity: 61 mo
Battery Voltage: 2.98 V
Brady Statistic AP VP Percent: 43.18 %
Brady Statistic AP VS Percent: 0.46 %
Brady Statistic AS VP Percent: 55.53 %
Brady Statistic AS VS Percent: 0.83 %
Brady Statistic RA Percent Paced: 43.21 %
Brady Statistic RV Percent Paced: 48.33 %
Date Time Interrogation Session: 20250818012204
HighPow Impedance: 64 Ohm
Implantable Lead Connection Status: 753985
Implantable Lead Connection Status: 753985
Implantable Lead Connection Status: 753985
Implantable Lead Implant Date: 20221121
Implantable Lead Implant Date: 20221121
Implantable Lead Implant Date: 20221121
Implantable Lead Location: 753858
Implantable Lead Location: 753859
Implantable Lead Location: 753860
Implantable Lead Model: 4598
Implantable Lead Model: 5076
Implantable Pulse Generator Implant Date: 20221121
Lead Channel Impedance Value: 156.606
Lead Channel Impedance Value: 156.606
Lead Channel Impedance Value: 168.889
Lead Channel Impedance Value: 174.595
Lead Channel Impedance Value: 174.595
Lead Channel Impedance Value: 304 Ohm
Lead Channel Impedance Value: 304 Ohm
Lead Channel Impedance Value: 323 Ohm
Lead Channel Impedance Value: 323 Ohm
Lead Channel Impedance Value: 380 Ohm
Lead Channel Impedance Value: 380 Ohm
Lead Channel Impedance Value: 399 Ohm
Lead Channel Impedance Value: 437 Ohm
Lead Channel Impedance Value: 494 Ohm
Lead Channel Impedance Value: 513 Ohm
Lead Channel Impedance Value: 570 Ohm
Lead Channel Impedance Value: 589 Ohm
Lead Channel Impedance Value: 589 Ohm
Lead Channel Pacing Threshold Amplitude: 0.75 V
Lead Channel Pacing Threshold Amplitude: 0.875 V
Lead Channel Pacing Threshold Amplitude: 0.875 V
Lead Channel Pacing Threshold Pulse Width: 0.4 ms
Lead Channel Pacing Threshold Pulse Width: 0.4 ms
Lead Channel Pacing Threshold Pulse Width: 0.4 ms
Lead Channel Sensing Intrinsic Amplitude: 1 mV
Lead Channel Sensing Intrinsic Amplitude: 1 mV
Lead Channel Sensing Intrinsic Amplitude: 17.875 mV
Lead Channel Sensing Intrinsic Amplitude: 17.875 mV
Lead Channel Setting Pacing Amplitude: 1.5 V
Lead Channel Setting Pacing Amplitude: 1.5 V
Lead Channel Setting Pacing Amplitude: 2 V
Lead Channel Setting Pacing Pulse Width: 0.4 ms
Lead Channel Setting Pacing Pulse Width: 0.4 ms
Lead Channel Setting Sensing Sensitivity: 0.3 mV
Zone Setting Status: 755011
Zone Setting Status: 755011

## 2024-06-12 ENCOUNTER — Other Ambulatory Visit: Payer: Self-pay | Admitting: Cardiology

## 2024-06-13 ENCOUNTER — Ambulatory Visit: Payer: Self-pay | Admitting: Cardiology

## 2024-06-14 ENCOUNTER — Other Ambulatory Visit: Payer: Self-pay | Admitting: Cardiology

## 2024-06-17 ENCOUNTER — Other Ambulatory Visit: Payer: Self-pay | Admitting: Physician Assistant

## 2024-06-24 ENCOUNTER — Other Ambulatory Visit (HOSPITAL_COMMUNITY): Payer: Self-pay | Admitting: Cardiology

## 2024-07-03 ENCOUNTER — Other Ambulatory Visit: Payer: Self-pay | Admitting: *Deleted

## 2024-07-11 ENCOUNTER — Inpatient Hospital Stay: Payer: Medicare Other | Attending: Oncology | Admitting: Oncology

## 2024-07-11 ENCOUNTER — Telehealth: Payer: Self-pay | Admitting: Oncology

## 2024-07-11 ENCOUNTER — Inpatient Hospital Stay: Payer: Medicare Other

## 2024-07-11 ENCOUNTER — Encounter: Payer: Self-pay | Admitting: *Deleted

## 2024-07-11 NOTE — Progress Notes (Signed)
"  No show" for lab/OV today. Scheduling message sent to reschedule for ~ 1 month. 

## 2024-07-17 NOTE — Progress Notes (Signed)
Remote ICD Transmission.

## 2024-07-23 ENCOUNTER — Other Ambulatory Visit (HOSPITAL_COMMUNITY): Payer: Self-pay | Admitting: Cardiology

## 2024-07-28 ENCOUNTER — Other Ambulatory Visit (HOSPITAL_BASED_OUTPATIENT_CLINIC_OR_DEPARTMENT_OTHER): Payer: Self-pay | Admitting: Family Medicine

## 2024-07-28 DIAGNOSIS — I10 Essential (primary) hypertension: Secondary | ICD-10-CM

## 2024-07-28 DIAGNOSIS — E1122 Type 2 diabetes mellitus with diabetic chronic kidney disease: Secondary | ICD-10-CM

## 2024-08-08 ENCOUNTER — Encounter: Payer: Self-pay | Admitting: Physician Assistant

## 2024-08-08 ENCOUNTER — Ambulatory Visit (INDEPENDENT_AMBULATORY_CARE_PROVIDER_SITE_OTHER): Admitting: Physician Assistant

## 2024-08-08 VITALS — BP 120/80 | HR 78 | Resp 20 | Ht 65.0 in | Wt 197.0 lb

## 2024-08-08 DIAGNOSIS — G309 Alzheimer's disease, unspecified: Secondary | ICD-10-CM | POA: Diagnosis not present

## 2024-08-08 DIAGNOSIS — F0280 Dementia in other diseases classified elsewhere without behavioral disturbance: Secondary | ICD-10-CM | POA: Diagnosis not present

## 2024-08-08 NOTE — Patient Instructions (Signed)
 Good to see you.  Continue Memantine  10mg  1 tablet twice a day  2. Recommend hearing evaluation to improve comprehension   3  Follow-up in 6 months     Check this website : Dementia Success Path   for tips on how to deal with certain situations   Please feel free to call [facility] at [phone] for guidance for Adult Day Program   FALL PRECAUTIONS: Be cautious when walking. Scan the area for obstacles that may increase the risk of trips and falls. When getting up in the mornings, sit up at the edge of the bed for a few minutes before getting out of bed. Consider elevating the bed at the head end to avoid drop of blood pressure when getting up. Walk always in a well-lit room (use night lights in the walls). Avoid area rugs or power cords from appliances in the middle of the walkways. Use a walker or a cane if necessary and consider physical therapy for balance exercise. Get your eyesight checked regularly.  FINANCIAL OVERSIGHT: Supervision, especially oversight when making financial decisions or transactions is also recommended.  HOME SAFETY: Consider the safety of the kitchen when operating appliances like stoves, microwave oven, and blender. Consider having supervision and share cooking responsibilities until no longer able to participate in those. Accidents with firearms and other hazards in the house should be identified and addressed as well.  DRIVING: Regarding driving, in patients with progressive memory problems, driving will be impaired. We advise to have someone else do the driving if trouble finding directions or if minor accidents are reported. Independent driving assessment is available to determine safety of driving.  ABILITY TO BE LEFT ALONE: If patient is unable to contact 911 operator, consider using LifeLine, or when the need is there, arrange for someone to stay with patients. Smoking is a fire hazard, consider supervision or cessation. Risk of wandering should be  assessed by caregiver and if detected at any point, supervision and safe proof recommendations should be instituted.  MEDICATION SUPERVISION: Inability to self-administer medication needs to be constantly addressed. Implement a mechanism to ensure safe administration of the medications.  RECOMMENDATIONS FOR ALL PATIENTS WITH MEMORY PROBLEMS: 1. Continue to exercise (Recommend 30 minutes of walking everyday, or 3 hours every week) 2. Increase social interactions - continue going to [place] and enjoy social gatherings with friends and family 3. Eat healthy, avoid fried foods and eat more fruits and vegetables 4. Maintain adequate blood pressure, blood sugar, and blood cholesterol level. Reducing the risk of stroke and cardiovascular disease also helps promoting better memory. 5. Avoid stressful situations. Live a simple life and avoid aggravations. Organize your time and prepare for the next day in anticipation. 6. Sleep well, avoid any interruptions of sleep and avoid any distractions in the bedroom that may interfere with adequate sleep quality 7. Avoid sugar, avoid sweets as there is a strong link between excessive sugar intake, diabetes, and cognitive impairment The Mediterranean diet has been shown to help patients reduce the risk of progressive memory disorders and reduces cardiovascular risk. This includes eating fish, eat fruits and green leafy vegetables, nuts like almonds and hazelnuts, walnuts, and also use olive oil. Avoid fast foods and fried foods as much as possible. Avoid sweets and sugar as sugar use has been linked to worsening of memory function.  There is always a concern of gradual progression of memory problems. If this is the case, then we may need to adjust level of care according to  patient needs. Support, both to the patient and caregiver, should then be put into place.

## 2024-08-08 NOTE — Progress Notes (Signed)
 Assessment/Plan:   Dementia likely due to Alzheimer's disease   Brett [name] is a very pleasant 78 y.o. 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 Patient is currently on memantine  10 mg twice daily. Memory is stable. Patient is able to participate on ADLs. No longer drives. Mood is stable    Follow up in 6  months. Continue memantine  10 mg twice daily, side effects discussed  Recommend good control of her cardiovascular risk factors Recommend adult day programs for socialization, cognitive stimulation Continue to control mood as per PCP Recommend hearing evaluation to improve comprehension      Subjective:    This patient is accompanied in the office by granddaughter who supplements the history.  Previous records as well as any outside records available were reviewed prior to todays visit. Patient was last seen on 02/01/2024   Any changes in memory since last visit?   About the same -granddaughter says.  He forgets conversations, forgets what he is doing.  He is not very active around the house, does not like to leave the place.  He speaks more [place] now than before.  He does not complete tasks that he starts. Confuses family members.  repeats oneself?  Endorsed, frequently Disoriented when walking into a room? Denies unless he is outside.  He to sit at the front porch   Leaving objects?  May misplace things but not in unusual places   Wandering behavior?  Denies.   Any personality changes since last visit?  Denies.   Any worsening depression?:  Denies.   Hallucinations or paranoia?  Denies.   Seizures? denies    Any sleep changes?  Sleeps better. Denies vivid dreams, REM behavior or sleepwalking   Sleep apnea?   Endorsed, uses CPAP Any hygiene concerns?  Needs reminders to shower. Independent of bathing and dressing?  Needs help otherwise he may wear one T-shirt over the other.   Does the patient needs help with medications?  Daughter is in charge   Who is in charge of the finances? Daughter  is in charge     Any changes in appetite?  Denies. Drinks plenty water, and drink tea.    Patient have trouble swallowing? Denies.   Does the patient cook? No Any headaches?   Denies.   Any vision changes? Denies  Chronic back pain  denies.   Ambulates with difficulty? Denies, but slower than prior, smaller steps.    Recent falls or head injuries? Denies.     Unilateral weakness, numbness or tingling? Denies.   Any tremors?  Denies.  Any anosmia?  Endorsed for the last 2 years. Any incontinence of urine?  Endorsed, uses diapers.   Any bowel dysfunction?  Constipation, takes prune juice    Patient lives  with the daughter  Does the patient drive?  No longer drives   History on Initial Assessment 12/21/2021: This is a pleasant 78 year old right-handed man with a history of hypertension, hyperlipidemia, MD, gout, CHF, s/p PPM, presenting for evaluation of memory loss. He feels his memory is so-so, not great but not that bad. His daughter Brett [name] is present to provide additional history. Brett [name] moved from [place]  last August 2022 to help him after his cardiac issues last year. Since she moved in,  she started noticing progressive memory changes. There is a lot of repetitive questions, same conversations. He gets the days confused and would not be able to keep up with his doctors appointments. He was previously managing his own medications and denies missing doses, Brett [name] started to help in August, but recently started putting his medications in a pillbox because he was getting confused with the bottles. He remembers to take them but she has to remind him sometimes. His last HbA1c was 7.2. He was managing finances  without difficulties, she now writes the checks and he signs them. He does not cook. He got lost driving around 3-4 months ago, he was 30 minutes away from home but was driving around for 3 hours and the police pulled him over. Glucose level at that time was 66. He has been driving very little since then. He denies misplacing things but loses the computer mouse or would disable the camera and Brett [name] has to figure out what he had pressed when he says it is not his fault. He is mostly on his laptop during the day watching junk. He would refuse to go outside with family or walk around, giving his pacemaker/cardiac issues as a reason even if he has been cleared for exercise by his cardiologist. He is able to dress himself. He does not bathe regularly, only when he has a doctors appointment. There is no paranoia or hallucinations, but he gets easily irritated when Brett [name] tries to get him to do something. He reports sleep is good, but Brett [name] shakes her head. He fell a week ago, he got lost in the house to use the bathroom, and fell down 6 steps. He denies any headaches, dizziness, diplopia, dysarthria/dysphagia, neck/back pain. He has occasional left hand numbness and tingling. He has bowel and bladder incontinence and wears Depends, Brett [name] also feels this causes him to isolate himself. He reports mood is good. Brett [name] reports hearing loss, as well as decreased ability to smell and taste. He has mild hand tremors that do not affect daily activities. He is currently living with Brett [name] and her boyfriend. His other daughter also lives in [place]. There is no clear family history of dementia, no significant head injuries. He stopped drinking alcohol  since his heart issues started. PREVIOUS MEDICATIONS:   CURRENT MEDICATIONS:  Outpatient Encounter Medications as of 08/08/2024  Medication Sig   Alcohol  Swabs (ALCOHOL  PREP) PADS Clean injection site before each injection   allopurinol  (ZYLOPRIM ) 300 MG tablet TAKE 1 TABLET  BY MOUTH ONCE  DAILY TO PREVENT GOUT   aspirin  81 MG tablet Take 81 mg by mouth daily.   atorvastatin  (LIPITOR) 20 MG tablet TAKE 1 TABLET BY MOUTH DAILY FOR CHOLESTEROL   bisoprolol  (ZEBETA ) 10 MG tablet TAKE 1 TABLET BY MOUTH DAILY   Cholecalciferol (VITAMIN D  PO) Take 5,000 Units by mouth daily.   D-Mannose 500 MG CAPS Take 1,500 mg by mouth daily.   dapagliflozin  propanediol (FARXIGA ) 10 MG TABS tablet TAKE 1 TABLET BY MOUTH DAILY   famotidine  (PEPCID ) 40 MG tablet Take 40 mg by mouth as needed for heartburn or indigestion.   furosemide  (LASIX ) 20 MG tablet TAKE 1 TABLET BY MOUTH DAILY   Loratadine (CLARITIN PO) Take 1 tablet by mouth as needed (allergies). Unknown strength   magnesium  oxide (MAG-OX) 400 MG tablet Take 400 mg by mouth daily.   memantine  (NAMENDA ) 10 MG tablet TAKE 1 TABLET BY MOUTH TWICE  DAILY   metFORMIN  (GLUCOPHAGE -XR) 500 MG 24 hr tablet Take 2  tablets (1,000 mg total) by mouth daily with breakfast.   sacubitril -valsartan  (ENTRESTO ) 97-103 MG Take 1 tablet by mouth 2 (two) times daily. Patient needs an appointment for further refills. 3 rd/final attempt   sharps container 1 each by Does not apply route as needed.   spironolactone  (ALDACTONE ) 25 MG tablet TAKE 1 TABLET BY MOUTH DAILY   tamsulosin  (FLOMAX ) 0.4 MG CAPS capsule TAKE 1 CAPSULE BY MOUTH AT  BEDTIME FOR PROSTATE   TOUJEO  MAX SOLOSTAR 300 UNIT/ML Solostar Pen INJECT SUBCUTANEOUSLY 80 UNITS  DAILY   No facility-administered encounter medications on file as of 08/08/2024.       12/22/2022   10:00 AM 12/17/2019   11:14 AM  MMSE - Mini Mental State Exam  Orientation to time 5 5  Orientation to Place 4 5  Registration 3 3  Attention/ Calculation 0 5  Recall 0 3  Language- name 2 objects 2 2  Language- repeat 1 1  Language- follow 3 step command 3 3  Language- read & follow direction 1 1  Write a sentence 1 1  Copy design 0 1  Total score 20 30      12/21/2021    9:00 AM  Montreal Cognitive  Assessment   Visuospatial/ Executive (0/5) 1  Naming (0/3) 2  Attention: Read list of digits (0/2) 0  Attention: Read list of letters (0/1) 0  Attention: Serial 7 subtraction starting at 100 (0/3) 3  Language: Repeat phrase (0/2) 0  Language : Fluency (0/1) 0  Abstraction (0/2) 0  Delayed Recall (0/5) 0  Orientation (0/6) 5  Total 11  Adjusted Score (based on education) 12    Objective:     PHYSICAL EXAMINATION:    VITALS:   Vitals:   08/08/24 1512  BP: 120/80  Pulse: 78  Resp: 20  SpO2: 98%  Weight: 197 lb (89.4 kg)  Height: 5' 5 (1.651 m)    GEN:  The patient appears stated age and is in NAD. HEENT:  Normocephalic, atraumatic.   Neurological examination:  General: NAD, well-groomed, appears stated age. Orientation: The patient is alert. Oriented to person,not to place and date Cranial nerves: There is good facial symmetry.The speech is fluent and clear. No aphasia or dysarthria. Fund of knowledge is reduced. Recent and remote memory are impaired. Attention and concentration are reduced. Able to name objects and repeat phrases.  Hearing is decreased to conversational tone.   Sensation: Sensation is intact to light touch throughout Motor: Strength is at least antigravity x4. DTR's 2/4 in UE/LE     Movement examination: Tone: There is normal tone in the UE/LE Abnormal movements:  no tremor.  No myoclonus.  No asterixis.   Coordination:  There is no decremation with RAM's. Normal finger to nose  Gait and Station: The patient has no difficulty arising out of a deep-seated chair without the use of the hands. The patient's stride length is shorter.  Gait is cautious and narrow.    Thank you for allowing us  the opportunity to participate in the care of this nice patient. Please do not hesitate to contact us  for any questions or concerns.   Total time spent on today's visit was 20 minutes dedicated to this patient today, preparing to see patient, examining the patient,  ordering tests and/or medications and counseling the patient, documenting clinical information in the EHR or other health record, independently interpreting results and communicating results to the patient/family, discussing treatment and goals, answering patient's questions and coordinating care.  Cc:  de [place], Raymond J, MD  Camie Sevin 08/08/2024 3:23 PM

## 2024-08-12 ENCOUNTER — Inpatient Hospital Stay (HOSPITAL_BASED_OUTPATIENT_CLINIC_OR_DEPARTMENT_OTHER): Admitting: Oncology

## 2024-08-12 ENCOUNTER — Inpatient Hospital Stay: Attending: Oncology

## 2024-08-12 DIAGNOSIS — E119 Type 2 diabetes mellitus without complications: Secondary | ICD-10-CM | POA: Diagnosis not present

## 2024-08-12 DIAGNOSIS — Z87891 Personal history of nicotine dependence: Secondary | ICD-10-CM | POA: Diagnosis not present

## 2024-08-12 DIAGNOSIS — M109 Gout, unspecified: Secondary | ICD-10-CM | POA: Diagnosis not present

## 2024-08-12 DIAGNOSIS — Z8349 Family history of other endocrine, nutritional and metabolic diseases: Secondary | ICD-10-CM | POA: Diagnosis not present

## 2024-08-12 DIAGNOSIS — G4730 Sleep apnea, unspecified: Secondary | ICD-10-CM | POA: Insufficient documentation

## 2024-08-12 DIAGNOSIS — E7800 Pure hypercholesterolemia, unspecified: Secondary | ICD-10-CM | POA: Insufficient documentation

## 2024-08-12 DIAGNOSIS — I110 Hypertensive heart disease with heart failure: Secondary | ICD-10-CM | POA: Diagnosis not present

## 2024-08-12 DIAGNOSIS — G309 Alzheimer's disease, unspecified: Secondary | ICD-10-CM | POA: Diagnosis not present

## 2024-08-12 DIAGNOSIS — I509 Heart failure, unspecified: Secondary | ICD-10-CM | POA: Diagnosis not present

## 2024-08-12 LAB — CBC WITH DIFFERENTIAL (CANCER CENTER ONLY)
Abs Immature Granulocytes: 0.04 K/uL (ref 0.00–0.07)
Basophils Absolute: 0 K/uL (ref 0.0–0.1)
Basophils Relative: 0 %
Eosinophils Absolute: 0.2 K/uL (ref 0.0–0.5)
Eosinophils Relative: 2 %
HCT: 46.1 % (ref 39.0–52.0)
Hemoglobin: 16.7 g/dL (ref 13.0–17.0)
Immature Granulocytes: 1 %
Lymphocytes Relative: 30 %
Lymphs Abs: 2.3 K/uL (ref 0.7–4.0)
MCH: 33.2 pg (ref 26.0–34.0)
MCHC: 36.2 g/dL — ABNORMAL HIGH (ref 30.0–36.0)
MCV: 91.7 fL (ref 80.0–100.0)
Monocytes Absolute: 0.7 K/uL (ref 0.1–1.0)
Monocytes Relative: 10 %
Neutro Abs: 4.4 K/uL (ref 1.7–7.7)
Neutrophils Relative %: 57 %
Platelet Count: 175 K/uL (ref 150–400)
RBC: 5.03 MIL/uL (ref 4.22–5.81)
RDW: 12.9 % (ref 11.5–15.5)
WBC Count: 7.6 K/uL (ref 4.0–10.5)
nRBC: 0 % (ref 0.0–0.2)

## 2024-08-12 LAB — FERRITIN: Ferritin: 260 ng/mL (ref 24–336)

## 2024-08-12 NOTE — Progress Notes (Signed)
  [facility] OFFICE PROGRESS NOTE   Diagnosis: Hemochromatosis  INTERVAL HISTORY:   Brett [name] returns as scheduled.  He is here with his daughter.  He is followed by neurology for Alzheimer's dementia.  He has no complaint.  Objective:  Vital signs in last 24 hours:  Blood pressure 129/74, pulse 61, temperature 98 F (36.7 C), resp. rate 18, height 5' 5 (1.651 m), weight 203 lb 4.8 oz (92.2 kg), SpO2 98%.  Resp: Lungs clear bilaterally, no respiratory distress Cardio: Distant heart sounds, regular rhythm GI: No hepatosplenomegaly, protuberant abdomen, nontender Vascular: No leg edema   Lab Results:  Lab Results  Component Value Date   WBC 7.6 08/12/2024   HGB 16.7 08/12/2024   HCT 46.1 08/12/2024   MCV 91.7 08/12/2024   PLT 175 08/12/2024   NEUTROABS 4.4 08/12/2024    CMP  Lab Results  Component Value Date   NA 139 06/06/2024   K 4.4 06/06/2024   CL 101 06/06/2024   CO2 20 06/06/2024   GLUCOSE 284 (H) 06/06/2024   BUN 24 06/06/2024   CREATININE 1.32 (H) 06/06/2024   CALCIUM  9.2 06/06/2024   PROT 6.4 (L) 03/25/2021   ALBUMIN 3.9 03/25/2021   AST 20 03/25/2021   ALT 28 03/25/2021   ALKPHOS 33 (L) 03/25/2021   BILITOT 1.1 03/25/2021   GFRNONAA 59 (L) 05/19/2022   GFRAA 80 09/16/2020    No results found for: CEA1, CEA, CAN199, CA125  No results found for: INR, LABPROT  Imaging:  No results found.  Medications: I have reviewed the patient's current medications.   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 He was last treated with phlebotomy therapy on 01/18/2024 History of tobacco use in the remote past. Hypertension. Hypercholesterolemia. Diabetes. Gout. Sleep apnea. Family history of hemochromatosis.  9.   Alzheimer's 10.  CHF 11.  Sleep apnea     Disposition: Brett [name] has a history of hemochromatosis.  He went phlebotomy in March of this year.  The ferritin was  slightly higher today.  He appears to have significant dementia.  We will follow-up on the ferritin level from today.  I do not recommend further phlebotomy therapy unless the ferritin rises significantly.  His daughter would like for him to continue follow-up in the hematology clinic.  He will return for a lab visit in 6 months and an office visit in 1 year.  Arley Hof, MD  08/12/2024  11:28 AM

## 2024-09-09 ENCOUNTER — Ambulatory Visit (INDEPENDENT_AMBULATORY_CARE_PROVIDER_SITE_OTHER): Payer: Medicare Other

## 2024-09-09 DIAGNOSIS — I420 Dilated cardiomyopathy: Secondary | ICD-10-CM

## 2024-09-09 LAB — CUP PACEART REMOTE DEVICE CHECK
Battery Remaining Longevity: 61 mo
Battery Voltage: 2.97 V
Brady Statistic AP VP Percent: 52.26 %
Brady Statistic AP VS Percent: 0.55 %
Brady Statistic AS VP Percent: 46.5 %
Brady Statistic AS VS Percent: 0.7 %
Brady Statistic RA Percent Paced: 52.53 %
Brady Statistic RV Percent Paced: 57.14 %
Date Time Interrogation Session: 20251117001603
HighPow Impedance: 65 Ohm
Implantable Lead Connection Status: 753985
Implantable Lead Connection Status: 753985
Implantable Lead Connection Status: 753985
Implantable Lead Implant Date: 20221121
Implantable Lead Implant Date: 20221121
Implantable Lead Implant Date: 20221121
Implantable Lead Location: 753858
Implantable Lead Location: 753859
Implantable Lead Location: 753860
Implantable Lead Model: 4598
Implantable Lead Model: 5076
Implantable Pulse Generator Implant Date: 20221121
Lead Channel Impedance Value: 141.867
Lead Channel Impedance Value: 141.867
Lead Channel Impedance Value: 149.625
Lead Channel Impedance Value: 160.941
Lead Channel Impedance Value: 160.941
Lead Channel Impedance Value: 266 Ohm
Lead Channel Impedance Value: 304 Ohm
Lead Channel Impedance Value: 304 Ohm
Lead Channel Impedance Value: 323 Ohm
Lead Channel Impedance Value: 342 Ohm
Lead Channel Impedance Value: 399 Ohm
Lead Channel Impedance Value: 437 Ohm
Lead Channel Impedance Value: 437 Ohm
Lead Channel Impedance Value: 437 Ohm
Lead Channel Impedance Value: 494 Ohm
Lead Channel Impedance Value: 513 Ohm
Lead Channel Impedance Value: 532 Ohm
Lead Channel Impedance Value: 570 Ohm
Lead Channel Pacing Threshold Amplitude: 0.875 V
Lead Channel Pacing Threshold Amplitude: 0.875 V
Lead Channel Pacing Threshold Amplitude: 1 V
Lead Channel Pacing Threshold Pulse Width: 0.4 ms
Lead Channel Pacing Threshold Pulse Width: 0.4 ms
Lead Channel Pacing Threshold Pulse Width: 0.4 ms
Lead Channel Sensing Intrinsic Amplitude: 0.75 mV
Lead Channel Sensing Intrinsic Amplitude: 0.75 mV
Lead Channel Sensing Intrinsic Amplitude: 18.25 mV
Lead Channel Sensing Intrinsic Amplitude: 18.25 mV
Lead Channel Setting Pacing Amplitude: 1.5 V
Lead Channel Setting Pacing Amplitude: 1.5 V
Lead Channel Setting Pacing Amplitude: 2 V
Lead Channel Setting Pacing Pulse Width: 0.4 ms
Lead Channel Setting Pacing Pulse Width: 0.4 ms
Lead Channel Setting Sensing Sensitivity: 0.3 mV
Zone Setting Status: 755011
Zone Setting Status: 755011

## 2024-09-11 ENCOUNTER — Ambulatory Visit: Payer: Self-pay | Admitting: Cardiology

## 2024-09-11 NOTE — Progress Notes (Signed)
 Remote ICD Transmission

## 2024-09-30 ENCOUNTER — Encounter (INDEPENDENT_AMBULATORY_CARE_PROVIDER_SITE_OTHER): Admitting: Ophthalmology

## 2024-09-30 DIAGNOSIS — I10 Essential (primary) hypertension: Secondary | ICD-10-CM | POA: Diagnosis not present

## 2024-09-30 DIAGNOSIS — Z7984 Long term (current) use of oral hypoglycemic drugs: Secondary | ICD-10-CM | POA: Diagnosis not present

## 2024-09-30 DIAGNOSIS — E113393 Type 2 diabetes mellitus with moderate nonproliferative diabetic retinopathy without macular edema, bilateral: Secondary | ICD-10-CM

## 2024-09-30 DIAGNOSIS — H43813 Vitreous degeneration, bilateral: Secondary | ICD-10-CM

## 2024-09-30 DIAGNOSIS — H35033 Hypertensive retinopathy, bilateral: Secondary | ICD-10-CM

## 2024-10-07 ENCOUNTER — Encounter (HOSPITAL_BASED_OUTPATIENT_CLINIC_OR_DEPARTMENT_OTHER): Payer: Self-pay | Admitting: Family Medicine

## 2024-10-07 ENCOUNTER — Ambulatory Visit (INDEPENDENT_AMBULATORY_CARE_PROVIDER_SITE_OTHER): Admitting: Family Medicine

## 2024-10-07 VITALS — BP 145/77 | HR 60 | Temp 98.8°F | Resp 18 | Ht 65.0 in | Wt 201.0 lb

## 2024-10-07 DIAGNOSIS — Z23 Encounter for immunization: Secondary | ICD-10-CM

## 2024-10-07 DIAGNOSIS — L602 Onychogryphosis: Secondary | ICD-10-CM | POA: Insufficient documentation

## 2024-10-07 DIAGNOSIS — E1142 Type 2 diabetes mellitus with diabetic polyneuropathy: Secondary | ICD-10-CM | POA: Insufficient documentation

## 2024-10-07 DIAGNOSIS — E1122 Type 2 diabetes mellitus with diabetic chronic kidney disease: Secondary | ICD-10-CM

## 2024-10-07 LAB — POCT GLYCOSYLATED HEMOGLOBIN (HGB A1C): Hemoglobin A1C: 8 % — AB (ref 4.0–5.6)

## 2024-10-07 NOTE — Assessment & Plan Note (Addendum)
 Patient has been doing well with current regimen he continues with metformin  in the morning, Farxiga , insulin  glargine.  Patient continue with insulin  glargine 74 units daily.  Most recent hemoglobin A1c was borderline at 8.1%.  Is due for recheck of hemoglobin A1c at this time. We will proceed with recheck of hemoglobin A1c today. Recommend continuing with current medication regimen and focusing more on lifestyle modification/dietary adjustments. Consideration for possibly adjusting current medication regimen.  Could consider utilizing once weekly GLP-1 receptor agonist which may also allow for lowering dose of insulin  - patient hesitant Foot exam completed today, evidence of neuropathy. Urine ACR up-to-date

## 2024-10-07 NOTE — Assessment & Plan Note (Signed)
 Patient notes ongoing issues with thickened toenails.  As above, he does have underlying neuropathy related to diabetes. Will refer to podiatry for further evaluation and recommendations

## 2024-10-07 NOTE — Progress Notes (Unsigned)
° ° °  Procedures performed today:    None.  Independent interpretation of notes and tests performed by another provider:   None.  Brief History, Exam, Impression, and Recommendations:    BP (!) 145/77 (BP Location: Left Arm, Patient Position: Sitting, Cuff Size: Normal)   Pulse 60   Temp 98.8 F (37.1 C) (Oral)   Resp 18   Ht 5' 5 (1.651 m)   Wt 201 lb (91.2 kg)   SpO2 99%   BMI 33.45 kg/m   Type 2 diabetes mellitus with stage 2 chronic kidney disease, with long-term current use of insulin  (HCC) Assessment & Plan: Patient has been doing well with current regimen he continues with metformin  in the morning, Farxiga , insulin  glargine.  Patient continue with insulin  glargine 74 units daily.  Most recent hemoglobin A1c was borderline at 8.1%.  Is due for recheck of hemoglobin A1c at this time. We will proceed with recheck of hemoglobin A1c today. Recommend continuing with current medication regimen and focusing more on lifestyle modification/dietary adjustments. Consideration for possibly adjusting current medication regimen.  Could consider utilizing once weekly GLP-1 receptor agonist which may also allow for lowering dose of insulin  - patient hesitant Foot exam completed today, evidence of neuropathy. Urine ACR up-to-date  Orders: -     Ambulatory referral to Podiatry -     POCT glycosylated hemoglobin (Hb A1C)  Thickened nails Assessment & Plan: Patient notes ongoing issues with thickened toenails.  As above, he does have underlying neuropathy related to diabetes. Will refer to podiatry for further evaluation and recommendations  Orders: -     Ambulatory referral to Podiatry  Diabetic peripheral neuropathy (HCC) -     Ambulatory referral to Podiatry  Immunization due -     Flu vaccine HIGH DOSE PF(Fluzone Trivalent)  Return in about 4 months (around 02/05/2025) for diabetes.   ___________________________________________ Eldean Nanna de [place], MD, ABFM, CAQSM [facility]  and [facility]

## 2024-10-08 ENCOUNTER — Ambulatory Visit (HOSPITAL_BASED_OUTPATIENT_CLINIC_OR_DEPARTMENT_OTHER): Payer: Self-pay | Admitting: Family Medicine

## 2024-10-08 ENCOUNTER — Encounter: Payer: Self-pay | Admitting: Nurse Practitioner

## 2024-11-03 ENCOUNTER — Other Ambulatory Visit (HOSPITAL_BASED_OUTPATIENT_CLINIC_OR_DEPARTMENT_OTHER): Payer: Self-pay | Admitting: Family Medicine

## 2024-11-03 ENCOUNTER — Other Ambulatory Visit: Payer: Self-pay | Admitting: Physician Assistant

## 2024-11-03 ENCOUNTER — Other Ambulatory Visit (HOSPITAL_COMMUNITY): Payer: Self-pay | Admitting: Cardiology

## 2024-11-03 DIAGNOSIS — R32 Unspecified urinary incontinence: Secondary | ICD-10-CM

## 2024-11-18 ENCOUNTER — Encounter (HOSPITAL_BASED_OUTPATIENT_CLINIC_OR_DEPARTMENT_OTHER): Payer: Self-pay | Admitting: Family Medicine

## 2024-11-18 DIAGNOSIS — R32 Unspecified urinary incontinence: Secondary | ICD-10-CM

## 2024-11-18 MED ORDER — ALLOPURINOL 300 MG PO TABS: ORAL_TABLET | ORAL | 2 refills | Status: AC

## 2024-11-18 NOTE — Telephone Encounter (Signed)
 Please see mychart message sent by pt/family about medications pt is needing refilled. Allopurinol  has been refilled but pt's spironolactone , farxiga , furosemide , bisoprolol , and entresto  are usually refilled by cardiology but pt is not able to reach the office nor send mychart message to them about needing these meds.  Would you be okay if we sent refill of these meds to [facility] for pt?

## 2024-11-20 MED ORDER — DAPAGLIFLOZIN PROPANEDIOL 10 MG PO TABS: 10.0000 mg | ORAL_TABLET | Freq: Every day | ORAL | 0 refills | Status: AC

## 2024-11-20 MED ORDER — BISOPROLOL FUMARATE 10 MG PO TABS: 10.0000 mg | ORAL_TABLET | Freq: Every day | ORAL | 0 refills | Status: AC

## 2024-11-20 MED ORDER — FUROSEMIDE 20 MG PO TABS: 20.0000 mg | ORAL_TABLET | Freq: Every day | ORAL | 0 refills | Status: AC

## 2024-11-20 MED ORDER — SPIRONOLACTONE 25 MG PO TABS: 25.0000 mg | ORAL_TABLET | Freq: Every day | ORAL | 0 refills | Status: AC

## 2024-11-20 MED ORDER — SACUBITRIL-VALSARTAN 97-103 MG PO TABS: 1.0000 | ORAL_TABLET | Freq: Two times a day (BID) | ORAL | 0 refills | Status: AC

## 2024-11-20 NOTE — Addendum Note (Signed)
 Addended by: DE CUBA, RJ J on: 11/20/2024 09:04 AM   Modules accepted: Orders

## 2025-02-03 ENCOUNTER — Ambulatory Visit: Admitting: Physician Assistant

## 2025-02-05 ENCOUNTER — Ambulatory Visit (HOSPITAL_BASED_OUTPATIENT_CLINIC_OR_DEPARTMENT_OTHER): Admitting: Family Medicine

## 2025-02-10 ENCOUNTER — Inpatient Hospital Stay

## 2025-03-31 ENCOUNTER — Encounter (INDEPENDENT_AMBULATORY_CARE_PROVIDER_SITE_OTHER): Admitting: Ophthalmology

## 2025-08-12 ENCOUNTER — Inpatient Hospital Stay

## 2025-08-12 ENCOUNTER — Inpatient Hospital Stay: Admitting: Oncology
